# Patient Record
Sex: Male | Born: 1950 | Race: White | Hispanic: No | Smoking: Former smoker
Health system: Southern US, Community
[De-identification: ages and names within clinical notes are randomized; demographics above are authoritative.]

## PROBLEM LIST (undated history)

## (undated) DIAGNOSIS — N1832 Chronic kidney disease, stage 3b: Secondary | ICD-10-CM

## (undated) DIAGNOSIS — M1990 Unspecified osteoarthritis, unspecified site: Secondary | ICD-10-CM

## (undated) DIAGNOSIS — F32A Depression, unspecified: Secondary | ICD-10-CM

## (undated) DIAGNOSIS — K219 Gastro-esophageal reflux disease without esophagitis: Secondary | ICD-10-CM

## (undated) DIAGNOSIS — H269 Unspecified cataract: Secondary | ICD-10-CM

## (undated) DIAGNOSIS — F329 Major depressive disorder, single episode, unspecified: Secondary | ICD-10-CM

## (undated) DIAGNOSIS — E119 Type 2 diabetes mellitus without complications: Secondary | ICD-10-CM

## (undated) DIAGNOSIS — I10 Essential (primary) hypertension: Secondary | ICD-10-CM

## (undated) DIAGNOSIS — H409 Unspecified glaucoma: Secondary | ICD-10-CM

## (undated) DIAGNOSIS — E785 Hyperlipidemia, unspecified: Secondary | ICD-10-CM

## (undated) DIAGNOSIS — I2510 Atherosclerotic heart disease of native coronary artery without angina pectoris: Secondary | ICD-10-CM

## (undated) HISTORY — DX: Unspecified osteoarthritis, unspecified site: M19.90

## (undated) HISTORY — DX: Major depressive disorder, single episode, unspecified: F32.9

## (undated) HISTORY — DX: Hyperlipidemia, unspecified: E78.5

## (undated) HISTORY — DX: Gastro-esophageal reflux disease without esophagitis: K21.9

## (undated) HISTORY — PX: CAROTID STENT: SHX1301

## (undated) HISTORY — DX: Depression, unspecified: F32.A

## (undated) HISTORY — PX: CORONARY ANGIOPLASTY: SHX604

## (undated) HISTORY — DX: Essential (primary) hypertension: I10

## (undated) HISTORY — DX: Type 2 diabetes mellitus without complications: E11.9

---

## 2005-10-23 HISTORY — PX: OTHER SURGICAL HISTORY: SHX169

## 2006-04-10 ENCOUNTER — Emergency Department: Payer: Self-pay | Admitting: Emergency Medicine

## 2006-04-11 ENCOUNTER — Other Ambulatory Visit: Payer: Self-pay

## 2008-04-08 ENCOUNTER — Other Ambulatory Visit: Payer: Self-pay

## 2008-04-08 ENCOUNTER — Inpatient Hospital Stay: Payer: Self-pay | Admitting: Cardiology

## 2008-04-08 DIAGNOSIS — I2510 Atherosclerotic heart disease of native coronary artery without angina pectoris: Secondary | ICD-10-CM

## 2008-04-08 HISTORY — DX: Atherosclerotic heart disease of native coronary artery without angina pectoris: I25.10

## 2008-04-08 HISTORY — PX: CORONARY ANGIOPLASTY WITH STENT PLACEMENT: SHX49

## 2010-03-19 ENCOUNTER — Emergency Department: Payer: Self-pay | Admitting: Emergency Medicine

## 2010-04-30 ENCOUNTER — Inpatient Hospital Stay: Payer: Self-pay | Admitting: Specialist

## 2010-09-21 ENCOUNTER — Emergency Department: Payer: Self-pay | Admitting: Emergency Medicine

## 2014-06-13 ENCOUNTER — Ambulatory Visit: Payer: Self-pay

## 2014-08-18 DIAGNOSIS — Z9889 Other specified postprocedural states: Secondary | ICD-10-CM | POA: Insufficient documentation

## 2014-12-04 LAB — HEMOGLOBIN A1C: Hgb A1c MFr Bld: 5.9 % (ref 4.0–6.0)

## 2014-12-04 LAB — BASIC METABOLIC PANEL
BUN: 13 mg/dL (ref 4–21)
CREATININE: 1 mg/dL (ref 0.6–1.3)
GLUCOSE: 117 mg/dL
POTASSIUM: 4.4 mmol/L (ref 3.4–5.3)
Sodium: 140 mmol/L (ref 137–147)

## 2014-12-04 LAB — LIPID PANEL
Cholesterol: 147 mg/dL (ref 0–200)
HDL: 40 mg/dL (ref 35–70)
LDL Cholesterol: 85 mg/dL
TRIGLYCERIDES: 108 mg/dL (ref 40–160)

## 2015-03-30 ENCOUNTER — Other Ambulatory Visit: Payer: Self-pay | Admitting: Family Medicine

## 2015-03-30 DIAGNOSIS — E119 Type 2 diabetes mellitus without complications: Secondary | ICD-10-CM

## 2015-03-30 MED ORDER — GLUCOSE BLOOD VI STRP: ORAL_STRIP | Status: DC

## 2015-04-02 ENCOUNTER — Other Ambulatory Visit: Payer: Self-pay | Admitting: Family Medicine

## 2015-04-02 DIAGNOSIS — E119 Type 2 diabetes mellitus without complications: Secondary | ICD-10-CM

## 2015-04-02 MED ORDER — METFORMIN HCL 500 MG PO TABS: 500.0000 mg | ORAL_TABLET | Freq: Two times a day (BID) | ORAL | Status: DC

## 2015-04-21 ENCOUNTER — Telehealth: Payer: Self-pay | Admitting: Family Medicine

## 2015-04-21 NOTE — Telephone Encounter (Signed)
Pt's daughter called requesting refill on his hydrocodone/  Her call back [phone] Thanks, TP

## 2015-04-22 ENCOUNTER — Other Ambulatory Visit: Payer: Self-pay | Admitting: Family Medicine

## 2015-04-22 DIAGNOSIS — M47816 Spondylosis without myelopathy or radiculopathy, lumbar region: Secondary | ICD-10-CM

## 2015-04-22 MED ORDER — HYDROCODONE-ACETAMINOPHEN 5-325 MG PO TABS: ORAL_TABLET | ORAL | Status: DC

## 2015-04-22 NOTE — Telephone Encounter (Signed)
rx up front for pick up 

## 2015-04-22 NOTE — Telephone Encounter (Signed)
Please advise 

## 2015-04-22 NOTE — Telephone Encounter (Signed)
Left message for daughter letting her know Rx is available for pick up

## 2015-05-24 ENCOUNTER — Other Ambulatory Visit: Payer: Self-pay | Admitting: Family Medicine

## 2015-05-27 ENCOUNTER — Other Ambulatory Visit: Payer: Self-pay | Admitting: Family Medicine

## 2015-05-27 DIAGNOSIS — E119 Type 2 diabetes mellitus without complications: Secondary | ICD-10-CM

## 2015-05-27 MED ORDER — GLUCOSE BLOOD VI STRP: ORAL_STRIP | Status: DC

## 2015-05-28 DIAGNOSIS — E119 Type 2 diabetes mellitus without complications: Secondary | ICD-10-CM | POA: Insufficient documentation

## 2015-05-28 DIAGNOSIS — I10 Essential (primary) hypertension: Secondary | ICD-10-CM | POA: Insufficient documentation

## 2015-05-28 DIAGNOSIS — J45909 Unspecified asthma, uncomplicated: Secondary | ICD-10-CM | POA: Insufficient documentation

## 2015-05-28 DIAGNOSIS — G4700 Insomnia, unspecified: Secondary | ICD-10-CM | POA: Insufficient documentation

## 2015-05-28 DIAGNOSIS — E1169 Type 2 diabetes mellitus with other specified complication: Secondary | ICD-10-CM | POA: Insufficient documentation

## 2015-05-28 DIAGNOSIS — I152 Hypertension secondary to endocrine disorders: Secondary | ICD-10-CM | POA: Insufficient documentation

## 2015-05-28 DIAGNOSIS — K219 Gastro-esophageal reflux disease without esophagitis: Secondary | ICD-10-CM | POA: Insufficient documentation

## 2015-05-28 DIAGNOSIS — N452 Orchitis: Secondary | ICD-10-CM | POA: Insufficient documentation

## 2015-05-28 DIAGNOSIS — E785 Hyperlipidemia, unspecified: Secondary | ICD-10-CM

## 2015-05-28 DIAGNOSIS — M479 Spondylosis, unspecified: Secondary | ICD-10-CM | POA: Insufficient documentation

## 2015-05-28 DIAGNOSIS — H5440 Blindness, one eye, unspecified eye: Secondary | ICD-10-CM | POA: Insufficient documentation

## 2015-05-28 DIAGNOSIS — F329 Major depressive disorder, single episode, unspecified: Secondary | ICD-10-CM | POA: Insufficient documentation

## 2015-05-28 DIAGNOSIS — K529 Noninfective gastroenteritis and colitis, unspecified: Secondary | ICD-10-CM | POA: Insufficient documentation

## 2015-05-28 DIAGNOSIS — L84 Corns and callosities: Secondary | ICD-10-CM | POA: Insufficient documentation

## 2015-05-28 DIAGNOSIS — E1159 Type 2 diabetes mellitus with other circulatory complications: Secondary | ICD-10-CM | POA: Insufficient documentation

## 2015-05-28 DIAGNOSIS — R519 Headache, unspecified: Secondary | ICD-10-CM | POA: Insufficient documentation

## 2015-05-28 DIAGNOSIS — R609 Edema, unspecified: Secondary | ICD-10-CM | POA: Insufficient documentation

## 2015-05-28 DIAGNOSIS — R51 Headache: Secondary | ICD-10-CM

## 2015-05-28 DIAGNOSIS — I2510 Atherosclerotic heart disease of native coronary artery without angina pectoris: Secondary | ICD-10-CM | POA: Insufficient documentation

## 2015-05-28 DIAGNOSIS — I00 Rheumatic fever without heart involvement: Secondary | ICD-10-CM | POA: Insufficient documentation

## 2015-05-28 DIAGNOSIS — B353 Tinea pedis: Secondary | ICD-10-CM | POA: Insufficient documentation

## 2015-05-28 DIAGNOSIS — F32A Depression, unspecified: Secondary | ICD-10-CM | POA: Insufficient documentation

## 2015-05-28 DIAGNOSIS — F17200 Nicotine dependence, unspecified, uncomplicated: Secondary | ICD-10-CM | POA: Insufficient documentation

## 2015-06-04 ENCOUNTER — Encounter: Payer: Self-pay | Admitting: Family Medicine

## 2015-06-04 ENCOUNTER — Ambulatory Visit (INDEPENDENT_AMBULATORY_CARE_PROVIDER_SITE_OTHER): Payer: Commercial Managed Care - HMO | Admitting: Family Medicine

## 2015-06-04 VITALS — BP 108/76 | HR 64 | Temp 98.1°F | Resp 16 | Ht 72.0 in | Wt 218.2 lb

## 2015-06-04 DIAGNOSIS — M47816 Spondylosis without myelopathy or radiculopathy, lumbar region: Secondary | ICD-10-CM

## 2015-06-04 DIAGNOSIS — I2510 Atherosclerotic heart disease of native coronary artery without angina pectoris: Secondary | ICD-10-CM | POA: Diagnosis not present

## 2015-06-04 DIAGNOSIS — Z23 Encounter for immunization: Secondary | ICD-10-CM | POA: Diagnosis not present

## 2015-06-04 DIAGNOSIS — E119 Type 2 diabetes mellitus without complications: Secondary | ICD-10-CM

## 2015-06-04 DIAGNOSIS — K219 Gastro-esophageal reflux disease without esophagitis: Secondary | ICD-10-CM

## 2015-06-04 DIAGNOSIS — E785 Hyperlipidemia, unspecified: Secondary | ICD-10-CM | POA: Diagnosis not present

## 2015-06-04 DIAGNOSIS — I10 Essential (primary) hypertension: Secondary | ICD-10-CM | POA: Diagnosis not present

## 2015-06-04 DIAGNOSIS — Z955 Presence of coronary angioplasty implant and graft: Secondary | ICD-10-CM

## 2015-06-04 LAB — POCT GLYCOSYLATED HEMOGLOBIN (HGB A1C): Hemoglobin A1C: 6.9

## 2015-06-04 MED ORDER — HYDROCODONE-ACETAMINOPHEN 5-325 MG PO TABS: ORAL_TABLET | ORAL | Status: DC

## 2015-06-04 MED ORDER — CLOPIDOGREL BISULFATE 75 MG PO TABS: 75.0000 mg | ORAL_TABLET | Freq: Every day | ORAL | Status: DC

## 2015-06-04 NOTE — Patient Instructions (Addendum)
Stop pantoprazole. Try Zantac 75 mg.over the counter 1-2 pills twice daily. We will do a physical at your next visit in 3 months.

## 2015-06-04 NOTE — Progress Notes (Signed)
Subjective:     Patient ID: [name], male   DOB: [birth year], 64 y.o.   MRN: [id number]  HPI  Chief Complaint  Patient presents with  . Diabetes    Patient is present in office for follow up from 12/04/14, patient was advised last office visit to continue Onglyza  and Metformin . Last HgbA1C was 5.9%, patient reports good compliance and tolerance of medication, patient states average blood sugar at home is 130-140.  Marland Kitchen Hypertension    Patient is present for follow up on 12/04/14, patient was advised to continue Metoprlol Tartrate  and HCTZ . Blood pressure at last office visit was 130/72  . Hyperlipidemia    Follow up from 12/04/14, at last office visit patient was advised to continue Crestor  and Fenofibrate   . Gastrophageal Reflux    Follow up from 12/04/14, advised to continue Pantoprazole, patient states that it has been helping.   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 Reports f/u with cardiology in the Spring with f/u in October pending. Labs updated in February.   Review of Systems  Gastrointestinal:       Uses Protonix daily, discussed trial on Zantac and/or prn use of the PPI.  Musculoskeletal: Positive for back pain (uses hydrocodone once or twice daily ).       Objective:   Physical Exam  Constitutional: He appears well-developed and well-nourished. No distress.  Lungs: clear Heart: RRR without murmur Lower extremities: no edema; pedal pulses intact, sensation to monofilament intact, no wounds noted.     Assessment:    1. Type 2 diabetes mellitus without complication - POCT glycosylated hemoglobin (Hb A1C)  2. Arteriosclerosis of coronary artery: per cardiology - clopidogrel (PLAVIX) 75 MG tablet; Take 1 tablet (75 mg total) by mouth daily.  Dispense: 30 tablet; Refill: 11  3. Essential (primary) hypertension  4. Gastroesophageal reflux disease, esophagitis presence not specified  5. Spondylosis of  lumbar region without myelopathy or radiculopathy - HYDROcodone-acetaminophen (NORCO/VICODIN) 5-325 MG per tablet; Every 6 hours as needed for low back pain  Dispense: 60 tablet; Refill: 0  6. HLD (hyperlipidemia)  7. Need for pneumococcal vaccination - Pneumococcal conjugate vaccine 13-valent    Plan:    Discussed updating physical in 3 months.Recommended coming off Protonix and trying Zantac otc twice daily. Continue present medication.

## 2015-06-25 ENCOUNTER — Other Ambulatory Visit: Payer: Self-pay | Admitting: Family Medicine

## 2015-06-25 DIAGNOSIS — E785 Hyperlipidemia, unspecified: Secondary | ICD-10-CM

## 2015-06-25 DIAGNOSIS — I10 Essential (primary) hypertension: Secondary | ICD-10-CM

## 2015-06-25 DIAGNOSIS — I2510 Atherosclerotic heart disease of native coronary artery without angina pectoris: Secondary | ICD-10-CM

## 2015-06-25 DIAGNOSIS — E119 Type 2 diabetes mellitus without complications: Secondary | ICD-10-CM

## 2015-06-25 MED ORDER — HYDROCHLOROTHIAZIDE 25 MG PO TABS: 25.0000 mg | ORAL_TABLET | Freq: Every day | ORAL | Status: DC

## 2015-06-25 MED ORDER — SAXAGLIPTIN HCL 5 MG PO TABS: 5.0000 mg | ORAL_TABLET | Freq: Every day | ORAL | Status: DC

## 2015-06-25 MED ORDER — ROSUVASTATIN CALCIUM 20 MG PO TABS: 20.0000 mg | ORAL_TABLET | Freq: Every day | ORAL | Status: DC

## 2015-06-25 MED ORDER — METOPROLOL TARTRATE 50 MG PO TABS: 50.0000 mg | ORAL_TABLET | Freq: Two times a day (BID) | ORAL | Status: DC

## 2015-07-12 ENCOUNTER — Other Ambulatory Visit: Payer: Self-pay | Admitting: Family Medicine

## 2015-07-12 DIAGNOSIS — M47816 Spondylosis without myelopathy or radiculopathy, lumbar region: Secondary | ICD-10-CM

## 2015-07-12 MED ORDER — HYDROCODONE-ACETAMINOPHEN 5-325 MG PO TABS: ORAL_TABLET | ORAL | Status: DC

## 2015-07-12 NOTE — Telephone Encounter (Signed)
Hydrocodone ready for pickup

## 2015-07-12 NOTE — Telephone Encounter (Signed)
Refill request

## 2015-07-12 NOTE — Telephone Encounter (Signed)
Daughter has been advised that Rx is available for pick up. KW

## 2015-07-12 NOTE — Telephone Encounter (Signed)
Pt's daughter called in to request a refill for pt's HYDROcodone-acetaminophen (NORCO/VICODIN) 5-325 MG per tablet. [name] stated that she forgot to call last week and that pt has ran out of the medication. Thanks TNP

## 2015-07-28 ENCOUNTER — Other Ambulatory Visit: Payer: Self-pay | Admitting: Family Medicine

## 2015-07-30 ENCOUNTER — Other Ambulatory Visit: Payer: Self-pay | Admitting: Family Medicine

## 2015-08-13 ENCOUNTER — Other Ambulatory Visit: Payer: Self-pay | Admitting: Family Medicine

## 2015-08-13 ENCOUNTER — Telehealth: Payer: Self-pay | Admitting: Family Medicine

## 2015-08-13 DIAGNOSIS — M47816 Spondylosis without myelopathy or radiculopathy, lumbar region: Secondary | ICD-10-CM

## 2015-08-13 MED ORDER — HYDROCODONE-ACETAMINOPHEN 5-325 MG PO TABS: ORAL_TABLET | ORAL | Status: DC

## 2015-08-13 NOTE — Telephone Encounter (Signed)
LMTCB-KW 

## 2015-08-13 NOTE — Telephone Encounter (Signed)
Refill request

## 2015-08-13 NOTE — Telephone Encounter (Signed)
Daughter has been advised. KW 

## 2015-08-13 NOTE — Telephone Encounter (Signed)
Pt's daughter contacted office for refill request on the following medications: HYDROcodone-acetaminophen (NORCO/VICODIN) 5-325 MG per tablet. Thanks TNP

## 2015-08-13 NOTE — Telephone Encounter (Signed)
Medication available for pick up. 

## 2015-08-26 ENCOUNTER — Other Ambulatory Visit: Payer: Self-pay | Admitting: Family Medicine

## 2015-08-26 DIAGNOSIS — K219 Gastro-esophageal reflux disease without esophagitis: Secondary | ICD-10-CM

## 2015-08-26 MED ORDER — PANTOPRAZOLE SODIUM 40 MG PO TBEC: DELAYED_RELEASE_TABLET | ORAL | Status: DC

## 2015-09-06 ENCOUNTER — Encounter: Payer: Self-pay | Admitting: Family Medicine

## 2015-09-06 ENCOUNTER — Ambulatory Visit (INDEPENDENT_AMBULATORY_CARE_PROVIDER_SITE_OTHER): Payer: Commercial Managed Care - HMO | Admitting: Family Medicine

## 2015-09-06 VITALS — BP 140/66 | HR 60 | Temp 98.5°F | Resp 16 | Ht 72.0 in | Wt 217.4 lb

## 2015-09-06 DIAGNOSIS — I2510 Atherosclerotic heart disease of native coronary artery without angina pectoris: Secondary | ICD-10-CM | POA: Diagnosis not present

## 2015-09-06 DIAGNOSIS — M47816 Spondylosis without myelopathy or radiculopathy, lumbar region: Secondary | ICD-10-CM | POA: Diagnosis not present

## 2015-09-06 DIAGNOSIS — Z23 Encounter for immunization: Secondary | ICD-10-CM | POA: Diagnosis not present

## 2015-09-06 DIAGNOSIS — E785 Hyperlipidemia, unspecified: Secondary | ICD-10-CM | POA: Diagnosis not present

## 2015-09-06 DIAGNOSIS — Z0000 Encounter for general adult medical examination without abnormal findings: Secondary | ICD-10-CM

## 2015-09-06 DIAGNOSIS — E119 Type 2 diabetes mellitus without complications: Secondary | ICD-10-CM | POA: Diagnosis not present

## 2015-09-06 DIAGNOSIS — I10 Essential (primary) hypertension: Secondary | ICD-10-CM

## 2015-09-06 LAB — POCT GLYCOSYLATED HEMOGLOBIN (HGB A1C): Hemoglobin A1C: 6.8

## 2015-09-06 MED ORDER — HYDROCODONE-ACETAMINOPHEN 5-325 MG PO TABS: ORAL_TABLET | ORAL | Status: DC

## 2015-09-06 NOTE — Progress Notes (Signed)
Subjective:     Patient ID: Stephen LemmingJerry H Rolph, male   DOB: [birth year], 64 y.o.   MRN: [id number]  HPI  Chief Complaint  Patient presents with  . Annual Exam    Patient comes into office today for his annual physical, he states his only concern is discussing medication change due to cost with insurance.   Will be changing insurances next year and Onglyza and Crestor will not be covered. Daughter, [name], will check on coverage for Januvia or atorvastatin.   Review of Systems General: Feeling well HEENT: edentulous; pending diabetic eye exam Cardiovascular: no chest pain, shortness of breath, or palpitations. Reports recently saw his cardiologist, Dr. Darrold JunkerParaschos, with no changes made. Defers AAA screen with ultrasound. GI: no heartburn, no change in bowel habits or blood in the stool.Defers colonoscopy. GU: nocturia x 1, no change in bladder habits  Psychiatric: not depressed Musculoskeletal: Will use hydrocodone once or twice daily for back pain. Remains active with LS spine x-ray consistent with DDD.    Objective:   Physical Exam  Constitutional: He appears well-developed and well-nourished. No distress.  Eyes: PERRLA, EOMI Neck: no thyromegaly, tenderness or nodules or cervical adenopathy. Carotids palpable without bruits ENT: TM's intact without inflammation; No tonsillar enlargement or exudate, Lungs: Clear Heart : RRR without murmur or gallop Abd: bowel sounds present, soft, non-tender, no organomegaly. No abdominal bruits or mass. Rectal: Prostate firm and non-tender, brown stool. Extremities: no edema Skin: left axillary skin tag.     Assessment:    1. Medicare annual wellness visit, initial - PSA  2. Type 2 diabetes mellitus without complication, without long-term current use of insulin (HCC): controlled with current medication - POCT glycosylated hemoglobin (Hb A1C)  3. CAD in native artery: Per cardiology  4. HLD (hyperlipidemia): labs obtained earlier this year  5.  Essential (primary) hypertension: stable  7. Spondylosis of lumbar region without myelopathy or radiculopathy - HYDROcodone-acetaminophen (NORCO/VICODIN) 5-325 MG tablet; Every 6 hours as needed for low back pain  Dispense: 60 tablet; Refill: 0  8. Need for influenza vaccination - Flu vaccine HIGH DOSE PF  9. Need for zoster vaccination - Varicella-zoster vaccine subcutaneous   10. GERD: controlled with intermittent use of acid blocker. Plan:    Further f/u pending lab work.

## 2015-09-06 NOTE — Patient Instructions (Signed)
We will call you with the lab work. Remember to update eye exam. Consider colonoscopy.

## 2015-09-07 ENCOUNTER — Telehealth: Payer: Self-pay

## 2015-09-07 LAB — PSA: PROSTATE SPECIFIC AG, SERUM: 0.8 ng/mL (ref 0.0–4.0)

## 2015-09-07 NOTE — Telephone Encounter (Signed)
Patient has been advised of lab report. KW 

## 2015-09-07 NOTE — Telephone Encounter (Signed)
-----   Message from Anola Gurneyobert Chauvin, [place] sent at 09/07/2015  7:50 AM EST ----- psa normal.

## 2015-09-18 ENCOUNTER — Other Ambulatory Visit: Payer: Self-pay | Admitting: Family Medicine

## 2015-09-20 ENCOUNTER — Other Ambulatory Visit: Payer: Self-pay | Admitting: Family Medicine

## 2015-09-20 DIAGNOSIS — E7800 Pure hypercholesterolemia, unspecified: Secondary | ICD-10-CM

## 2015-09-20 MED ORDER — FENOFIBRATE 145 MG PO TABS: 145.0000 mg | ORAL_TABLET | Freq: Every day | ORAL | Status: DC

## 2015-10-11 ENCOUNTER — Other Ambulatory Visit: Payer: Self-pay | Admitting: Family Medicine

## 2015-10-11 ENCOUNTER — Telehealth: Payer: Self-pay | Admitting: Family Medicine

## 2015-10-11 DIAGNOSIS — M47816 Spondylosis without myelopathy or radiculopathy, lumbar region: Secondary | ICD-10-CM

## 2015-10-11 MED ORDER — HYDROCODONE-ACETAMINOPHEN 5-325 MG PO TABS: ORAL_TABLET | ORAL | Status: DC

## 2015-10-11 NOTE — Telephone Encounter (Signed)
Refill request, review. KW

## 2015-10-11 NOTE — Telephone Encounter (Signed)
rx is up front for pickup

## 2015-10-11 NOTE — Telephone Encounter (Signed)
Pt's daughter [name] contacted office for refill request on the following medications: HYDROcodone-acetaminophen (NORCO/VICODIN) 5-325 MG tablet. [name] stated that pt will run out tomorrow. Thanks TNP

## 2015-10-11 NOTE — Telephone Encounter (Signed)
Patient has been advised. KW 

## 2015-10-27 ENCOUNTER — Telehealth: Payer: Self-pay | Admitting: Family Medicine

## 2015-10-27 NOTE — Telephone Encounter (Signed)
Pt daughter, Stephen CosierCrystal called stating the new insurance, [employer] will not cover the Rx rosuvastatin (CRESTOR) 20 MG tablet and saxagliptin HCl (ONGLYZA) 5 MG TABS tablet.  Pt will need these changed to a medication insurance will cover if possible.  [facility].  NW#[phone]/VHCB#[phone]/MW

## 2015-10-28 ENCOUNTER — Other Ambulatory Visit: Payer: Self-pay | Admitting: Family Medicine

## 2015-10-28 DIAGNOSIS — E119 Type 2 diabetes mellitus without complications: Secondary | ICD-10-CM

## 2015-10-28 DIAGNOSIS — E785 Hyperlipidemia, unspecified: Secondary | ICD-10-CM

## 2015-10-28 MED ORDER — SITAGLIPTIN PHOSPHATE 100 MG PO TABS: 100.0000 mg | ORAL_TABLET | Freq: Every day | ORAL | Status: DC

## 2015-10-28 MED ORDER — ATORVASTATIN CALCIUM 80 MG PO TABS: 80.0000 mg | ORAL_TABLET | Freq: Every day | ORAL | Status: DC

## 2015-10-28 NOTE — Telephone Encounter (Signed)
Left message for daughter advised her of prescriptions sent in. KW

## 2015-10-28 NOTE — Telephone Encounter (Signed)
Please review and advise. KW 

## 2015-10-28 NOTE — Telephone Encounter (Signed)
I have changed the medications to others in the same family.

## 2015-11-08 DIAGNOSIS — I10 Essential (primary) hypertension: Secondary | ICD-10-CM | POA: Diagnosis not present

## 2015-11-08 DIAGNOSIS — E1165 Type 2 diabetes mellitus with hyperglycemia: Secondary | ICD-10-CM | POA: Diagnosis not present

## 2015-11-08 DIAGNOSIS — J449 Chronic obstructive pulmonary disease, unspecified: Secondary | ICD-10-CM | POA: Diagnosis not present

## 2015-11-08 DIAGNOSIS — D696 Thrombocytopenia, unspecified: Secondary | ICD-10-CM | POA: Diagnosis not present

## 2015-11-12 ENCOUNTER — Other Ambulatory Visit: Payer: Self-pay | Admitting: Family Medicine

## 2015-11-12 ENCOUNTER — Telehealth: Payer: Self-pay | Admitting: Emergency Medicine

## 2015-11-12 DIAGNOSIS — M47816 Spondylosis without myelopathy or radiculopathy, lumbar region: Secondary | ICD-10-CM

## 2015-11-12 MED ORDER — HYDROCODONE-ACETAMINOPHEN 5-325 MG PO TABS: ORAL_TABLET | ORAL | Status: DC

## 2015-11-12 NOTE — Telephone Encounter (Signed)
rx up front for pick up 

## 2015-11-12 NOTE — Telephone Encounter (Signed)
Pt advised-aa 

## 2015-11-12 NOTE — Telephone Encounter (Signed)
Daughter called requesting refill for pt Hydrocodone/APAP 5/325mg . Call when ready for pick up. Thanks.

## 2015-11-19 DIAGNOSIS — I10 Essential (primary) hypertension: Secondary | ICD-10-CM | POA: Diagnosis not present

## 2015-11-19 DIAGNOSIS — D696 Thrombocytopenia, unspecified: Secondary | ICD-10-CM | POA: Diagnosis not present

## 2015-11-19 DIAGNOSIS — Z7982 Long term (current) use of aspirin: Secondary | ICD-10-CM | POA: Diagnosis not present

## 2015-11-19 DIAGNOSIS — E7800 Pure hypercholesterolemia, unspecified: Secondary | ICD-10-CM | POA: Diagnosis not present

## 2015-11-19 DIAGNOSIS — Z87891 Personal history of nicotine dependence: Secondary | ICD-10-CM | POA: Diagnosis not present

## 2015-11-19 DIAGNOSIS — I2510 Atherosclerotic heart disease of native coronary artery without angina pectoris: Secondary | ICD-10-CM | POA: Diagnosis not present

## 2015-11-19 DIAGNOSIS — M1990 Unspecified osteoarthritis, unspecified site: Secondary | ICD-10-CM | POA: Diagnosis not present

## 2015-11-19 DIAGNOSIS — Z79899 Other long term (current) drug therapy: Secondary | ICD-10-CM | POA: Diagnosis not present

## 2015-11-19 DIAGNOSIS — Z8546 Personal history of malignant neoplasm of prostate: Secondary | ICD-10-CM | POA: Diagnosis not present

## 2015-11-19 DIAGNOSIS — J449 Chronic obstructive pulmonary disease, unspecified: Secondary | ICD-10-CM | POA: Diagnosis not present

## 2015-11-19 DIAGNOSIS — K219 Gastro-esophageal reflux disease without esophagitis: Secondary | ICD-10-CM | POA: Diagnosis not present

## 2015-11-24 ENCOUNTER — Telehealth: Payer: Self-pay | Admitting: Family Medicine

## 2015-11-24 ENCOUNTER — Other Ambulatory Visit: Payer: Self-pay | Admitting: Family Medicine

## 2015-11-24 DIAGNOSIS — E119 Type 2 diabetes mellitus without complications: Secondary | ICD-10-CM

## 2015-11-24 MED ORDER — BLOOD GLUCOSE MONITOR KIT: PACK | Status: AC

## 2015-11-24 NOTE — Telephone Encounter (Signed)
Please review and advise. KW 

## 2015-11-24 NOTE — Telephone Encounter (Signed)
Daughter called needing new RX for his diabetic supplies.  Insurance will only pay for Freestyle or the Onetouce.  They use [facility]  Her call back is [phone]

## 2015-11-24 NOTE — Telephone Encounter (Signed)
Done

## 2015-11-26 ENCOUNTER — Other Ambulatory Visit: Payer: Self-pay | Admitting: Family Medicine

## 2015-11-26 DIAGNOSIS — K219 Gastro-esophageal reflux disease without esophagitis: Secondary | ICD-10-CM

## 2015-11-26 MED ORDER — PANTOPRAZOLE SODIUM 40 MG PO TBEC: DELAYED_RELEASE_TABLET | ORAL | Status: DC

## 2015-12-10 ENCOUNTER — Ambulatory Visit (INDEPENDENT_AMBULATORY_CARE_PROVIDER_SITE_OTHER): Payer: PPO | Admitting: Family Medicine

## 2015-12-10 ENCOUNTER — Encounter: Payer: Self-pay | Admitting: Family Medicine

## 2015-12-10 VITALS — BP 118/76 | HR 59 | Temp 97.7°F | Resp 16 | Wt 222.2 lb

## 2015-12-10 DIAGNOSIS — E119 Type 2 diabetes mellitus without complications: Secondary | ICD-10-CM

## 2015-12-10 DIAGNOSIS — E785 Hyperlipidemia, unspecified: Secondary | ICD-10-CM | POA: Diagnosis not present

## 2015-12-10 DIAGNOSIS — I10 Essential (primary) hypertension: Secondary | ICD-10-CM

## 2015-12-10 DIAGNOSIS — M47816 Spondylosis without myelopathy or radiculopathy, lumbar region: Secondary | ICD-10-CM

## 2015-12-10 LAB — POCT GLYCOSYLATED HEMOGLOBIN (HGB A1C): Hemoglobin A1C: 7.3

## 2015-12-10 MED ORDER — HYDROCODONE-ACETAMINOPHEN 5-325 MG PO TABS: ORAL_TABLET | ORAL | Status: DC

## 2015-12-10 NOTE — Progress Notes (Signed)
Subjective:     Patient ID: [name], male   DOB: 1951/07/15, 65 y.o.   MRN: [id number]  HPI  Chief Complaint  Patient presents with  . Diabetes    Follow up visit from 09/06/15, at last visit patients condition was controlled with medication. Inhouse HgbA1C in house was 6.8%. Patient reports that blood sugar at home has been ranging from 138/178, patient denies any hypoglycemia incidents and is inspecting feet for wounds and sores. Patient reports good compliance, fair tolerance and good symptom control.   States he is pending f/u with his cardiologist in the Spring. He is pending eye exam. He states he has been less active due to the winter weather and has gained 4 # since August.   Review of Systems  Respiratory: Negative for shortness of breath.   Cardiovascular: Negative for chest pain.       Objective:   Physical Exam  Constitutional: He appears well-developed and well-nourished. No distress.  Cardiovascular: Normal rate and regular rhythm.   Pulmonary/Chest: Breath sounds normal.  Musculoskeletal: He exhibits no edema (of lower extremities).       Assessment:    1. Controlled type 2 diabetes mellitus without complication, without long-term current use of insulin (HCC) - POCT glycosylated hemoglobin (Hb A1C)  2. Essential (primary) hypertension - Comprehensive metabolic panel  3. HLD (hyperlipidemia) - Lipid panel  4. Spondylosis of lumbar region without myelopathy or radiculopathy - HYDROcodone-acetaminophen (NORCO/VICODIN) 5-325 MG tablet; Every 6 hours as needed for low back pain  Dispense: 60 tablet; Refill: 0    Plan:    Further f/u pending lab work. Encouraged to resume outdoor activities.

## 2015-12-10 NOTE — Patient Instructions (Signed)
Try to use the acid blocker as needed. If you get more acid burning trying to stop it-taper it slowly or use Zantac instead.

## 2015-12-11 LAB — LIPID PANEL
CHOLESTEROL TOTAL: 169 mg/dL (ref 100–199)
Chol/HDL Ratio: 5.5 ratio units — ABNORMAL HIGH (ref 0.0–5.0)
HDL: 31 mg/dL — AB (ref >39)
LDL CALC: 108 mg/dL — AB (ref 0–99)
TRIGLYCERIDES: 152 mg/dL — AB (ref 0–149)
VLDL Cholesterol Cal: 30 mg/dL (ref 5–40)

## 2015-12-11 LAB — COMPREHENSIVE METABOLIC PANEL
ALK PHOS: 40 IU/L (ref 39–117)
ALT: 28 IU/L (ref 0–44)
AST: 19 IU/L (ref 0–40)
Albumin/Globulin Ratio: 2.1 (ref 1.1–2.5)
Albumin: 4.8 g/dL (ref 3.6–4.8)
BUN/Creatinine Ratio: 13 (ref 10–22)
BUN: 13 mg/dL (ref 8–27)
Bilirubin Total: 0.5 mg/dL (ref 0.0–1.2)
CO2: 24 mmol/L (ref 18–29)
CREATININE: 1.01 mg/dL (ref 0.76–1.27)
Calcium: 10.4 mg/dL — ABNORMAL HIGH (ref 8.6–10.2)
Chloride: 101 mmol/L (ref 96–106)
GFR calc Af Amer: 90 mL/min/{1.73_m2} (ref >59)
GFR calc non Af Amer: 78 mL/min/{1.73_m2} (ref >59)
GLOBULIN, TOTAL: 2.3 g/dL (ref 1.5–4.5)
GLUCOSE: 150 mg/dL — AB (ref 65–99)
Potassium: 4.6 mmol/L (ref 3.5–5.2)
SODIUM: 142 mmol/L (ref 134–144)
Total Protein: 7.1 g/dL (ref 6.0–8.5)

## 2015-12-13 ENCOUNTER — Telehealth: Payer: Self-pay

## 2015-12-13 NOTE — Telephone Encounter (Signed)
-----   Message from Anola Gurney, [place] sent at 12/13/2015  7:53 AM EST ----- Labs are ok. Your cholesterol is not a low as I though it would be-just wish to confirm that you are taking atorvastatin 80 mg.

## 2015-12-13 NOTE — Telephone Encounter (Signed)
Patient was advised he states that he is taking Atorvastatin daily. KW

## 2015-12-20 ENCOUNTER — Encounter: Payer: Self-pay | Admitting: Family Medicine

## 2015-12-20 ENCOUNTER — Ambulatory Visit (INDEPENDENT_AMBULATORY_CARE_PROVIDER_SITE_OTHER): Payer: PPO | Admitting: Family Medicine

## 2015-12-20 VITALS — BP 122/66 | HR 63 | Temp 98.1°F | Resp 16 | Wt 222.2 lb

## 2015-12-20 DIAGNOSIS — E119 Type 2 diabetes mellitus without complications: Secondary | ICD-10-CM | POA: Diagnosis not present

## 2015-12-20 DIAGNOSIS — Q6650 Congenital pes planus, unspecified foot: Secondary | ICD-10-CM | POA: Diagnosis not present

## 2015-12-20 DIAGNOSIS — J069 Acute upper respiratory infection, unspecified: Secondary | ICD-10-CM | POA: Diagnosis not present

## 2015-12-20 DIAGNOSIS — M2040 Other hammer toe(s) (acquired), unspecified foot: Secondary | ICD-10-CM | POA: Diagnosis not present

## 2015-12-20 MED ORDER — HYDROCODONE-HOMATROPINE 5-1.5 MG/5ML PO SYRP: ORAL_SOLUTION | ORAL | Status: DC

## 2015-12-20 MED ORDER — DOXYCYCLINE HYCLATE 100 MG PO TABS: 100.0000 mg | ORAL_TABLET | Freq: Two times a day (BID) | ORAL | Status: DC

## 2015-12-20 NOTE — Progress Notes (Signed)
Subjective:     Patient ID: [name], male   DOB: 09-04-1951, 65 y.o.   MRN: [id number]  HPI  Chief Complaint  Patient presents with  . Cough    Patient comes in office today with concerns of cough and chest congestion for the past 7 days. Patient reports symptoms of body chills and wheezing, he has been taking otc Coricidan HBP with no relief.  Daughter who accompanies him is ill as well. Reports clear sinus drainage and cough occasionally productive of purulent sputum. + flu shot   Review of Systems  Constitutional: Negative for fever.       Objective:   Physical Exam  Constitutional: He appears well-developed and well-nourished. No distress.  Ears: T.M's intact without inflammation Sinuses: non-tender Throat: no tonsillar enlargement or exudate Neck: no cervical adenopathy Lungs: clear     Assessment:    1. Upper respiratory infection - HYDROcodone-homatropine (HYCODAN) 5-1.5 MG/5ML syrup; 5 ml 4-6 hours as needed for cough  Dispense: 240 mL; Refill: 0 - doxycycline (VIBRA-TABS) 100 MG tablet; Take 1 tablet (100 mg total) by mouth 2 (two) times daily.  Dispense: 20 tablet; Refill: 0    Plan:    Discussed use of saline nasal spray and Claritin, If cough not improving in the next 2-3 days to start abx.

## 2015-12-20 NOTE — Patient Instructions (Signed)
Discussed use of saline nasal spray and Claritin. If cough getting worse in the next 2-3 days with increased colored sputum: start the antibiotic.

## 2015-12-22 ENCOUNTER — Other Ambulatory Visit: Payer: Self-pay | Admitting: Family Medicine

## 2015-12-22 DIAGNOSIS — E119 Type 2 diabetes mellitus without complications: Secondary | ICD-10-CM

## 2015-12-22 MED ORDER — METFORMIN HCL 500 MG PO TABS: 500.0000 mg | ORAL_TABLET | Freq: Two times a day (BID) | ORAL | Status: DC

## 2016-01-04 DIAGNOSIS — Z0000 Encounter for general adult medical examination without abnormal findings: Secondary | ICD-10-CM | POA: Diagnosis not present

## 2016-01-13 ENCOUNTER — Other Ambulatory Visit: Payer: Self-pay | Admitting: Family Medicine

## 2016-01-13 DIAGNOSIS — E119 Type 2 diabetes mellitus without complications: Secondary | ICD-10-CM

## 2016-01-13 MED ORDER — GLUCOSE BLOOD VI STRP: ORAL_STRIP | Status: DC

## 2016-01-14 ENCOUNTER — Telehealth: Payer: Self-pay | Admitting: Family Medicine

## 2016-01-14 ENCOUNTER — Other Ambulatory Visit: Payer: Self-pay | Admitting: Family Medicine

## 2016-01-14 DIAGNOSIS — M47816 Spondylosis without myelopathy or radiculopathy, lumbar region: Secondary | ICD-10-CM

## 2016-01-14 MED ORDER — HYDROCODONE-ACETAMINOPHEN 5-325 MG PO TABS: ORAL_TABLET | ORAL | Status: DC

## 2016-01-14 NOTE — Telephone Encounter (Signed)
Medication available for pick up. 

## 2016-01-14 NOTE — Telephone Encounter (Signed)
Daughter called back and was notified. KW

## 2016-01-14 NOTE — Telephone Encounter (Signed)
Pt contacted office for refill request on the following medications:  HYDROcodone-acetaminophen (NORCO/VICODIN) 5-325 MG tablet.  ZO#[phone]/[id number]#[phone]/MW

## 2016-01-14 NOTE — Telephone Encounter (Signed)
LMTCB-KW 

## 2016-01-14 NOTE — Telephone Encounter (Signed)
Refill request, KW 

## 2016-02-11 ENCOUNTER — Other Ambulatory Visit: Payer: Self-pay | Admitting: Family Medicine

## 2016-02-11 ENCOUNTER — Telehealth: Payer: Self-pay | Admitting: Emergency Medicine

## 2016-02-11 DIAGNOSIS — M47816 Spondylosis without myelopathy or radiculopathy, lumbar region: Secondary | ICD-10-CM

## 2016-02-11 DIAGNOSIS — Q6650 Congenital pes planus, unspecified foot: Secondary | ICD-10-CM | POA: Diagnosis not present

## 2016-02-11 DIAGNOSIS — M2040 Other hammer toe(s) (acquired), unspecified foot: Secondary | ICD-10-CM | POA: Diagnosis not present

## 2016-02-11 DIAGNOSIS — E119 Type 2 diabetes mellitus without complications: Secondary | ICD-10-CM | POA: Diagnosis not present

## 2016-02-11 MED ORDER — HYDROCODONE-ACETAMINOPHEN 5-325 MG PO TABS: ORAL_TABLET | ORAL | Status: DC

## 2016-02-11 NOTE — Telephone Encounter (Signed)
Refill request. KW 

## 2016-02-11 NOTE — Telephone Encounter (Signed)
Medication available for pick up. 

## 2016-02-11 NOTE — Telephone Encounter (Signed)
Pt advised-aa 

## 2016-02-11 NOTE — Telephone Encounter (Signed)
Pt daughter request refill on hydrocodone/APAP 5/325mg 

## 2016-02-16 DIAGNOSIS — E1165 Type 2 diabetes mellitus with hyperglycemia: Secondary | ICD-10-CM | POA: Diagnosis not present

## 2016-02-16 DIAGNOSIS — I10 Essential (primary) hypertension: Secondary | ICD-10-CM | POA: Diagnosis not present

## 2016-02-16 DIAGNOSIS — J449 Chronic obstructive pulmonary disease, unspecified: Secondary | ICD-10-CM | POA: Diagnosis not present

## 2016-02-18 DIAGNOSIS — E7800 Pure hypercholesterolemia, unspecified: Secondary | ICD-10-CM | POA: Diagnosis not present

## 2016-02-18 DIAGNOSIS — Z955 Presence of coronary angioplasty implant and graft: Secondary | ICD-10-CM | POA: Diagnosis not present

## 2016-02-18 DIAGNOSIS — I10 Essential (primary) hypertension: Secondary | ICD-10-CM | POA: Diagnosis not present

## 2016-02-18 DIAGNOSIS — Z9889 Other specified postprocedural states: Secondary | ICD-10-CM | POA: Diagnosis not present

## 2016-02-18 DIAGNOSIS — I2510 Atherosclerotic heart disease of native coronary artery without angina pectoris: Secondary | ICD-10-CM | POA: Diagnosis not present

## 2016-02-24 ENCOUNTER — Other Ambulatory Visit: Payer: Self-pay | Admitting: Family Medicine

## 2016-02-24 DIAGNOSIS — E7800 Pure hypercholesterolemia, unspecified: Secondary | ICD-10-CM

## 2016-02-24 MED ORDER — FENOFIBRATE 145 MG PO TABS: 145.0000 mg | ORAL_TABLET | Freq: Every day | ORAL | Status: DC

## 2016-03-08 ENCOUNTER — Ambulatory Visit (INDEPENDENT_AMBULATORY_CARE_PROVIDER_SITE_OTHER): Payer: PPO | Admitting: Family Medicine

## 2016-03-08 ENCOUNTER — Encounter: Payer: Self-pay | Admitting: Family Medicine

## 2016-03-08 VITALS — BP 116/70 | HR 58 | Temp 97.7°F | Resp 16 | Wt 217.2 lb

## 2016-03-08 DIAGNOSIS — E119 Type 2 diabetes mellitus without complications: Secondary | ICD-10-CM

## 2016-03-08 DIAGNOSIS — M47816 Spondylosis without myelopathy or radiculopathy, lumbar region: Secondary | ICD-10-CM | POA: Diagnosis not present

## 2016-03-08 DIAGNOSIS — I10 Essential (primary) hypertension: Secondary | ICD-10-CM | POA: Diagnosis not present

## 2016-03-08 DIAGNOSIS — S86811A Strain of other muscle(s) and tendon(s) at lower leg level, right leg, initial encounter: Secondary | ICD-10-CM

## 2016-03-08 LAB — POCT GLYCOSYLATED HEMOGLOBIN (HGB A1C)

## 2016-03-08 MED ORDER — HYDROCODONE-ACETAMINOPHEN 5-325 MG PO TABS: ORAL_TABLET | ORAL | Status: DC

## 2016-03-08 MED ORDER — HYDROCODONE-ACETAMINOPHEN 5-325 MG PO TABS
ORAL_TABLET | ORAL | Status: DC
Start: 2016-03-08 — End: 2016-06-08

## 2016-03-08 NOTE — Progress Notes (Signed)
Subjective:     Patient ID: [name], male   DOB: 1951-10-18, 65 y.o.   MRN: [id number]  HPI  Chief Complaint  Patient presents with  . Diabetes    Patient comes in office today for 3 month follow up, last office visit was 12/10/15 HgBA1C was 7.3%. Patient reports that sugars have been ranging from 100s-185 (high). Patient denies any hyperglycemia incidents and reports good compliance, tolerance and symptom control on medication  . Hypertension    Follow up from 12/10/15, last office CMP was ordered. Patient reports good compliance and tolerance on medication.   . Hyperlipidemia    Follow up from 12/10/15, last office visit Lipid Panel was ordered. Patient reports good compliance and tolerance of medication.   States he is active mowing and walking and has lost 5# since last office visit. Reports right calf tenderness over the last two weeks when he comes in from working.   Review of Systems  Respiratory: Negative for shortness of breath.   Cardiovascular: Negative for chest pain and palpitations.       Reports seeing his cardiologist, Dr. Darrold JunkerParaschos, recently without change in medication.       Objective:   Physical Exam  Constitutional: He appears well-developed and well-nourished. No distress.  Cardiovascular: Normal rate and regular rhythm.   Pulmonary/Chest: Breath sounds normal.  Musculoskeletal: He exhibits no edema.  Mild right calf tenderness. Mild right calf pain with knee flexion and foot dorsiflexion. KF/KE/PF/DF: 5/5       Assessment:    1. Type 2 diabetes mellitus without complication, without long-term current use of insulin (HCC): controlled - POCT glycosylated hemoglobin (Hb A1C)  2. Essential (primary) hypertension: stable  3. Spondylosis of lumbar region without myelopathy or radiculopathy: will refill pain medication  For 3 months. - HYDROcodone-acetaminophen (NORCO/VICODIN) 5-325 MG tablet; Every 6 hours as needed for low back pain  Dispense: 60 tablet;  Refill: 0  4. Strain of calf muscle, right, initial encounter     Plan:    Discussed icing of calf and use of pain medication. Avoid nsaid's due to cardiovascular disease and Plavix use.

## 2016-03-08 NOTE — Patient Instructions (Signed)
Try icing your calf for 20 minutes at the end of the day. May use pain medication as necessary.

## 2016-03-13 ENCOUNTER — Encounter: Payer: Self-pay | Admitting: Family Medicine

## 2016-03-13 ENCOUNTER — Ambulatory Visit (INDEPENDENT_AMBULATORY_CARE_PROVIDER_SITE_OTHER): Payer: PPO | Admitting: Family Medicine

## 2016-03-13 VITALS — BP 102/62 | HR 67 | Temp 98.1°F | Resp 18 | Wt 218.8 lb

## 2016-03-13 DIAGNOSIS — J069 Acute upper respiratory infection, unspecified: Secondary | ICD-10-CM

## 2016-03-13 NOTE — Progress Notes (Signed)
Subjective:     Patient ID: Alecia LemmingJerry H Linnen, male   DOB: [birth year], 65 y.o.   MRN: [id number] Chief Complaint  Patient presents with  . Cough    Patient comes in office today with concerns of cough that started in 03/09/16. Patient reports that he has had wheezing and bilateral flank pain due to cough. Patient has been taking otc Coricidan HBP.   Reports mild sore throat, occasional purulent sinus drainage and accompanying cough. States he mowed his yard prior to the onset of symptoms. Also states he spends time with his grandchildren. HPI   Review of Systems     Objective:   Physical Exam  Constitutional: He appears well-developed and well-nourished. No distress.  Ears: T.M's intact without inflammation Sinuses:  Throat: no tonsillar enlargement or exudate Neck: no cervical adenopathy Lungs: clear except for a transient wheeze.     Assessment:    1. Upper respiratory infection    Plan:    Discussed otc medication. To call prior to the weekend if sx not improving.

## 2016-03-13 NOTE — Patient Instructions (Addendum)
Discussed use of Coricidin for high bp, Claritin or similar, salt water nasal spray, or Delsym for cough. Your pain medication may also help your cough at night.

## 2016-03-15 DIAGNOSIS — D696 Thrombocytopenia, unspecified: Secondary | ICD-10-CM | POA: Diagnosis not present

## 2016-03-15 DIAGNOSIS — Z7982 Long term (current) use of aspirin: Secondary | ICD-10-CM | POA: Diagnosis not present

## 2016-03-15 DIAGNOSIS — M1990 Unspecified osteoarthritis, unspecified site: Secondary | ICD-10-CM | POA: Diagnosis not present

## 2016-03-15 DIAGNOSIS — K219 Gastro-esophageal reflux disease without esophagitis: Secondary | ICD-10-CM | POA: Diagnosis not present

## 2016-03-15 DIAGNOSIS — J449 Chronic obstructive pulmonary disease, unspecified: Secondary | ICD-10-CM | POA: Diagnosis not present

## 2016-03-15 DIAGNOSIS — I10 Essential (primary) hypertension: Secondary | ICD-10-CM | POA: Diagnosis not present

## 2016-03-15 DIAGNOSIS — Z8546 Personal history of malignant neoplasm of prostate: Secondary | ICD-10-CM | POA: Diagnosis not present

## 2016-03-15 DIAGNOSIS — I2510 Atherosclerotic heart disease of native coronary artery without angina pectoris: Secondary | ICD-10-CM | POA: Diagnosis not present

## 2016-03-15 DIAGNOSIS — Z87891 Personal history of nicotine dependence: Secondary | ICD-10-CM | POA: Diagnosis not present

## 2016-03-15 DIAGNOSIS — E7800 Pure hypercholesterolemia, unspecified: Secondary | ICD-10-CM | POA: Diagnosis not present

## 2016-03-15 DIAGNOSIS — Z79899 Other long term (current) drug therapy: Secondary | ICD-10-CM | POA: Diagnosis not present

## 2016-03-21 ENCOUNTER — Other Ambulatory Visit: Payer: Self-pay | Admitting: Family Medicine

## 2016-03-21 ENCOUNTER — Telehealth: Payer: Self-pay | Admitting: Family Medicine

## 2016-03-21 DIAGNOSIS — J019 Acute sinusitis, unspecified: Secondary | ICD-10-CM

## 2016-03-21 MED ORDER — DOXYCYCLINE HYCLATE 100 MG PO TABS: 100.0000 mg | ORAL_TABLET | Freq: Two times a day (BID) | ORAL | Status: DC

## 2016-03-21 NOTE — Telephone Encounter (Signed)
Daughter reports that the patient continues to have a productive cough. He has not had fever. He does have PND and headache. Symptoms are not relieved with mucinex. Patient uses [facility]. Thanks!

## 2016-03-21 NOTE — Telephone Encounter (Signed)
Left message to call back for more info (has he had fever, upper teeth pain, taken otc meds, etc.)

## 2016-03-21 NOTE — Telephone Encounter (Signed)
Daughter called saying her dad was in last Thursday and seen for sinus. She said Nadine CountsBob told him if he wasn't any better by this week he would call in an antibiotic.  He is still no better.  [facility]  Daughter's call back is [phone]  Thanks, Barth Kirksteri

## 2016-03-21 NOTE — Telephone Encounter (Signed)
Advised daughter Rx is at pharmacy. KW

## 2016-03-21 NOTE — Telephone Encounter (Signed)
I have sent in doxycycline. 

## 2016-03-24 ENCOUNTER — Other Ambulatory Visit: Payer: Self-pay | Admitting: Family Medicine

## 2016-03-24 DIAGNOSIS — I10 Essential (primary) hypertension: Secondary | ICD-10-CM

## 2016-03-24 MED ORDER — LISINOPRIL 2.5 MG PO TABS: 2.5000 mg | ORAL_TABLET | Freq: Every day | ORAL | Status: DC

## 2016-04-06 DIAGNOSIS — I10 Essential (primary) hypertension: Secondary | ICD-10-CM | POA: Diagnosis not present

## 2016-04-06 DIAGNOSIS — I2510 Atherosclerotic heart disease of native coronary artery without angina pectoris: Secondary | ICD-10-CM | POA: Diagnosis not present

## 2016-04-06 DIAGNOSIS — J449 Chronic obstructive pulmonary disease, unspecified: Secondary | ICD-10-CM | POA: Diagnosis not present

## 2016-04-06 DIAGNOSIS — I5032 Chronic diastolic (congestive) heart failure: Secondary | ICD-10-CM | POA: Diagnosis not present

## 2016-04-19 ENCOUNTER — Other Ambulatory Visit: Payer: Self-pay | Admitting: Family Medicine

## 2016-04-19 DIAGNOSIS — K219 Gastro-esophageal reflux disease without esophagitis: Secondary | ICD-10-CM

## 2016-04-19 MED ORDER — PANTOPRAZOLE SODIUM 40 MG PO TBEC: DELAYED_RELEASE_TABLET | ORAL | Status: DC

## 2016-05-18 DIAGNOSIS — I10 Essential (primary) hypertension: Secondary | ICD-10-CM | POA: Diagnosis not present

## 2016-05-18 DIAGNOSIS — J449 Chronic obstructive pulmonary disease, unspecified: Secondary | ICD-10-CM | POA: Diagnosis not present

## 2016-05-18 DIAGNOSIS — E1165 Type 2 diabetes mellitus with hyperglycemia: Secondary | ICD-10-CM | POA: Diagnosis not present

## 2016-05-18 DIAGNOSIS — E1159 Type 2 diabetes mellitus with other circulatory complications: Secondary | ICD-10-CM | POA: Diagnosis not present

## 2016-05-22 ENCOUNTER — Other Ambulatory Visit: Payer: Self-pay | Admitting: Family Medicine

## 2016-05-22 DIAGNOSIS — I10 Essential (primary) hypertension: Secondary | ICD-10-CM

## 2016-05-22 DIAGNOSIS — E119 Type 2 diabetes mellitus without complications: Secondary | ICD-10-CM

## 2016-05-22 DIAGNOSIS — I2510 Atherosclerotic heart disease of native coronary artery without angina pectoris: Secondary | ICD-10-CM

## 2016-05-22 MED ORDER — METOPROLOL TARTRATE 50 MG PO TABS: 50.0000 mg | ORAL_TABLET | Freq: Two times a day (BID) | ORAL | 11 refills | Status: DC

## 2016-05-22 MED ORDER — HYDROCHLOROTHIAZIDE 25 MG PO TABS: 25.0000 mg | ORAL_TABLET | Freq: Every day | ORAL | 11 refills | Status: DC

## 2016-05-22 MED ORDER — METFORMIN HCL 500 MG PO TABS: 500.0000 mg | ORAL_TABLET | Freq: Two times a day (BID) | ORAL | 1 refills | Status: DC

## 2016-06-08 ENCOUNTER — Encounter: Payer: Self-pay | Admitting: Family Medicine

## 2016-06-08 ENCOUNTER — Ambulatory Visit (INDEPENDENT_AMBULATORY_CARE_PROVIDER_SITE_OTHER): Payer: PPO | Admitting: Family Medicine

## 2016-06-08 VITALS — BP 122/68 | HR 66 | Temp 98.2°F | Wt 218.4 lb

## 2016-06-08 DIAGNOSIS — I10 Essential (primary) hypertension: Secondary | ICD-10-CM

## 2016-06-08 DIAGNOSIS — E119 Type 2 diabetes mellitus without complications: Secondary | ICD-10-CM | POA: Diagnosis not present

## 2016-06-08 DIAGNOSIS — M79661 Pain in right lower leg: Secondary | ICD-10-CM | POA: Diagnosis not present

## 2016-06-08 DIAGNOSIS — M47816 Spondylosis without myelopathy or radiculopathy, lumbar region: Secondary | ICD-10-CM

## 2016-06-08 LAB — POCT GLYCOSYLATED HEMOGLOBIN (HGB A1C): HEMOGLOBIN A1C: 7.6

## 2016-06-08 MED ORDER — METFORMIN HCL 1000 MG PO TABS: 1000.0000 mg | ORAL_TABLET | Freq: Two times a day (BID) | ORAL | 3 refills | Status: DC

## 2016-06-08 MED ORDER — HYDROCODONE-ACETAMINOPHEN 5-325 MG PO TABS: ORAL_TABLET | ORAL | 0 refills | Status: DC

## 2016-06-08 NOTE — Patient Instructions (Signed)
We will schedule the test of your leg in the office and let you know when to come in..Marland Kitchen

## 2016-06-08 NOTE — Progress Notes (Signed)
Subjective:     Patient ID: [name], male   DOB: [birth year], 65 y.o.   MRN: [id number]  HPI  Chief Complaint  Patient presents with  . Diabetes    Patient comes in office today for 3 month follow up last office visit was 03/08/16, HgbA1C 6.8%. Patient reports blood sugar has been ranging from 125-200, he reports good compliance and tolerance on medication. Patient denies any hyperglycemia incidents and is inspecting his feet for wounds and sores.   . Hypertension    Follow up from 03/08/16, at last visit patients condition was stable blood pressure in house was 102/62.  States he has been spending time at the bedside of his dying mother. He consequently has not been exercising as much and not eating as well. One # weight gain since prior office visit. Also continues to have calf pain. He notices it the most when he is walking but tends to ignore it per his report. Can not elicit a clearcut claudication history.   Review of Systems  Respiratory: Negative for shortness of breath.   Cardiovascular: Negative for chest pain and palpitations.       Objective:   Physical Exam  Constitutional: He appears well-developed and well-nourished. No distress.  Musculoskeletal:  Right calf without erythema or swelling. Mild medial calf tenderness.  Lungs: clear Heart: RRR without murmur Lower extremities: no edema; pedal pulses intact in left lower extremity. Right lower extremity DP is 2+ and PT is 1+., Sensation to monofilament intact, no wounds noted.     Assessment:    1. Controlled type 2 diabetes mellitus without complication, without long-term current use of insulin (HCC): will increase metformin  - POCT glycosylated hemoglobin (Hb A1C) - metFORMIN (GLUCOPHAGE) 1000 MG tablet; Take 1 tablet (1,000 mg total) by mouth 2 (two) times daily with a meal.  Dispense: 180 tablet; Refill: 3  2. Calf pain, right - Ankle Brachial Index Assessment (BFP)  3. Essential (primary) hypertension:  stable  4. Spondylosis of lumbar region without myelopathy or radiculopathy: 3 month rx for hydrocodone. Not a good candidate for nsaid's due to CAD and plavix use. - HYDROcodone-acetaminophen (NORCO/VICODIN) 5-325 MG tablet; Every 6 hours as needed for low back pain  Dispense: 60 tablet; Refill:0    Plan:    Further f/u pending ABI results

## 2016-06-16 ENCOUNTER — Encounter: Payer: Self-pay | Admitting: Family Medicine

## 2016-06-16 ENCOUNTER — Encounter (INDEPENDENT_AMBULATORY_CARE_PROVIDER_SITE_OTHER): Payer: PPO

## 2016-06-27 ENCOUNTER — Other Ambulatory Visit: Payer: Self-pay | Admitting: Family Medicine

## 2016-06-27 DIAGNOSIS — I2510 Atherosclerotic heart disease of native coronary artery without angina pectoris: Secondary | ICD-10-CM

## 2016-06-27 MED ORDER — CLOPIDOGREL BISULFATE 75 MG PO TABS: 75.0000 mg | ORAL_TABLET | Freq: Every day | ORAL | 11 refills | Status: DC

## 2016-07-06 DIAGNOSIS — Z23 Encounter for immunization: Secondary | ICD-10-CM | POA: Diagnosis not present

## 2016-07-06 DIAGNOSIS — E785 Hyperlipidemia, unspecified: Secondary | ICD-10-CM | POA: Diagnosis not present

## 2016-07-06 DIAGNOSIS — L570 Actinic keratosis: Secondary | ICD-10-CM | POA: Diagnosis not present

## 2016-08-16 DIAGNOSIS — E1159 Type 2 diabetes mellitus with other circulatory complications: Secondary | ICD-10-CM | POA: Diagnosis not present

## 2016-08-16 DIAGNOSIS — Z8546 Personal history of malignant neoplasm of prostate: Secondary | ICD-10-CM | POA: Diagnosis not present

## 2016-08-16 DIAGNOSIS — H1013 Acute atopic conjunctivitis, bilateral: Secondary | ICD-10-CM | POA: Diagnosis not present

## 2016-08-16 DIAGNOSIS — J449 Chronic obstructive pulmonary disease, unspecified: Secondary | ICD-10-CM | POA: Diagnosis not present

## 2016-08-17 DIAGNOSIS — Z8546 Personal history of malignant neoplasm of prostate: Secondary | ICD-10-CM | POA: Diagnosis not present

## 2016-08-18 DIAGNOSIS — I2510 Atherosclerotic heart disease of native coronary artery without angina pectoris: Secondary | ICD-10-CM | POA: Diagnosis not present

## 2016-08-18 DIAGNOSIS — E7800 Pure hypercholesterolemia, unspecified: Secondary | ICD-10-CM | POA: Diagnosis not present

## 2016-08-18 DIAGNOSIS — I10 Essential (primary) hypertension: Secondary | ICD-10-CM | POA: Diagnosis not present

## 2016-08-18 DIAGNOSIS — Z9889 Other specified postprocedural states: Secondary | ICD-10-CM | POA: Diagnosis not present

## 2016-09-01 DIAGNOSIS — Z9889 Other specified postprocedural states: Secondary | ICD-10-CM | POA: Diagnosis not present

## 2016-09-01 DIAGNOSIS — I2510 Atherosclerotic heart disease of native coronary artery without angina pectoris: Secondary | ICD-10-CM | POA: Diagnosis not present

## 2016-09-06 DIAGNOSIS — Z8546 Personal history of malignant neoplasm of prostate: Secondary | ICD-10-CM | POA: Diagnosis not present

## 2016-09-06 DIAGNOSIS — I509 Heart failure, unspecified: Secondary | ICD-10-CM | POA: Diagnosis not present

## 2016-09-06 DIAGNOSIS — D696 Thrombocytopenia, unspecified: Secondary | ICD-10-CM | POA: Diagnosis not present

## 2016-09-06 DIAGNOSIS — D649 Anemia, unspecified: Secondary | ICD-10-CM | POA: Diagnosis not present

## 2016-09-06 DIAGNOSIS — J449 Chronic obstructive pulmonary disease, unspecified: Secondary | ICD-10-CM | POA: Diagnosis not present

## 2016-09-06 DIAGNOSIS — K219 Gastro-esophageal reflux disease without esophagitis: Secondary | ICD-10-CM | POA: Diagnosis not present

## 2016-09-06 DIAGNOSIS — I2510 Atherosclerotic heart disease of native coronary artery without angina pectoris: Secondary | ICD-10-CM | POA: Diagnosis not present

## 2016-09-06 DIAGNOSIS — M1990 Unspecified osteoarthritis, unspecified site: Secondary | ICD-10-CM | POA: Diagnosis not present

## 2016-09-06 DIAGNOSIS — I110 Hypertensive heart disease with heart failure: Secondary | ICD-10-CM | POA: Diagnosis not present

## 2016-09-06 DIAGNOSIS — Z7982 Long term (current) use of aspirin: Secondary | ICD-10-CM | POA: Diagnosis not present

## 2016-09-06 DIAGNOSIS — K859 Acute pancreatitis without necrosis or infection, unspecified: Secondary | ICD-10-CM | POA: Diagnosis not present

## 2016-09-06 DIAGNOSIS — Z86010 Personal history of colonic polyps: Secondary | ICD-10-CM | POA: Diagnosis not present

## 2016-09-06 DIAGNOSIS — E7800 Pure hypercholesterolemia, unspecified: Secondary | ICD-10-CM | POA: Diagnosis not present

## 2016-09-06 DIAGNOSIS — Z79899 Other long term (current) drug therapy: Secondary | ICD-10-CM | POA: Diagnosis not present

## 2016-09-06 DIAGNOSIS — Z87891 Personal history of nicotine dependence: Secondary | ICD-10-CM | POA: Diagnosis not present

## 2016-09-08 ENCOUNTER — Encounter: Payer: Self-pay | Admitting: Family Medicine

## 2016-09-08 ENCOUNTER — Ambulatory Visit (INDEPENDENT_AMBULATORY_CARE_PROVIDER_SITE_OTHER): Payer: PPO | Admitting: Family Medicine

## 2016-09-08 VITALS — BP 138/88 | HR 60 | Temp 97.9°F | Resp 16 | Wt 209.0 lb

## 2016-09-08 DIAGNOSIS — I10 Essential (primary) hypertension: Secondary | ICD-10-CM

## 2016-09-08 DIAGNOSIS — E119 Type 2 diabetes mellitus without complications: Secondary | ICD-10-CM | POA: Diagnosis not present

## 2016-09-08 DIAGNOSIS — Z23 Encounter for immunization: Secondary | ICD-10-CM | POA: Diagnosis not present

## 2016-09-08 DIAGNOSIS — M47816 Spondylosis without myelopathy or radiculopathy, lumbar region: Secondary | ICD-10-CM

## 2016-09-08 DIAGNOSIS — Z125 Encounter for screening for malignant neoplasm of prostate: Secondary | ICD-10-CM | POA: Diagnosis not present

## 2016-09-08 LAB — POCT GLYCOSYLATED HEMOGLOBIN (HGB A1C): HEMOGLOBIN A1C: 6.4

## 2016-09-08 MED ORDER — HYDROCODONE-ACETAMINOPHEN 5-325 MG PO TABS: ORAL_TABLET | ORAL | 0 refills | Status: DC

## 2016-09-08 NOTE — Progress Notes (Signed)
Subjective:     Patient ID: Stephen LemmingJerry H Beverley, male   DOB: Nov 24, 1950, 65 y.o.   MRN: [id number]  HPI  Chief Complaint  Patient presents with  . Diabetes    3 month FU. Last OV was 06/08/2016. A1C was 7.6%. Increased Metformin to 1000 mg BID at LOV. Pt reports good compliance with tx, and good tolerance of tx. Denies hypoglycemic incidents. FBS range from 102-113.   Has been recently seen by cardiology with normal stress echocardiogram. Wishes to get flu shot today. Uses pain medication as precribed.   Review of Systems  Gastrointestinal:       Continues to defer colonoscopy  Genitourinary:       Nocturia x one       Objective:   Physical Exam  Constitutional: He appears well-developed and well-nourished. No distress.  Cardiovascular: Normal rate and regular rhythm.   Pulmonary/Chest: Breath sounds normal.  Musculoskeletal: He exhibits no edema (of lower extremities).       Assessment:    1. Spondylosis of lumbar region without myelopathy or radiculopathy: - HYDROcodone-acetaminophen (NORCO/VICODIN) 5-325 MG tablet; Every 6 hours as needed for low back pain  Dispense: 60 tablet; Refill: 0 - HYDROcodone-acetaminophen (NORCO/VICODIN) 5-325 MG tablet; Every 6 hours as needed for low back pain  Dispense: 60 tablet; Refill: 0 - HYDROcodone-acetaminophen (NORCO/VICODIN) 5-325 MG tablet; One every 6 hours as needed for pain  Dispense: 60 tablet; Refill: 0  2. Controlled type 2 diabetes mellitus without complication, without long-term current use of insulin (HCC) - POCT glycosylated hemoglobin (Hb A1C)  3. Essential (primary) hypertension: per cardiology  4. Need for influenza vaccination - Flu vaccine HIGH DOSE PF  5. Screening for prostate cancer - PSA    Plan:    Encouraged eye exam. Further f/u pending PSA result.

## 2016-09-08 NOTE — Patient Instructions (Signed)
We will call you with the PSA result. Don't forget to update your eye exam.

## 2016-09-09 LAB — PSA: Prostate Specific Ag, Serum: 0.8 ng/mL (ref 0.0–4.0)

## 2016-09-11 ENCOUNTER — Telehealth: Payer: Self-pay

## 2016-09-11 NOTE — Telephone Encounter (Signed)
-----   Message from Anola Gurneyobert Chauvin, [place] sent at 09/11/2016  7:34 AM EST ----- Psa is excellent

## 2016-09-11 NOTE — Telephone Encounter (Signed)
Patient has been advised. KW 

## 2016-09-18 DIAGNOSIS — I2510 Atherosclerotic heart disease of native coronary artery without angina pectoris: Secondary | ICD-10-CM | POA: Diagnosis not present

## 2016-09-18 DIAGNOSIS — Z955 Presence of coronary angioplasty implant and graft: Secondary | ICD-10-CM | POA: Diagnosis not present

## 2016-09-18 DIAGNOSIS — E119 Type 2 diabetes mellitus without complications: Secondary | ICD-10-CM | POA: Diagnosis not present

## 2016-09-18 DIAGNOSIS — I10 Essential (primary) hypertension: Secondary | ICD-10-CM | POA: Diagnosis not present

## 2016-09-22 ENCOUNTER — Other Ambulatory Visit: Payer: Self-pay | Admitting: Family Medicine

## 2016-09-22 DIAGNOSIS — E7800 Pure hypercholesterolemia, unspecified: Secondary | ICD-10-CM

## 2016-09-22 MED ORDER — FENOFIBRATE 145 MG PO TABS: 145.0000 mg | ORAL_TABLET | Freq: Every day | ORAL | 3 refills | Status: DC

## 2016-10-03 DIAGNOSIS — I10 Essential (primary) hypertension: Secondary | ICD-10-CM | POA: Diagnosis not present

## 2016-10-03 DIAGNOSIS — I2510 Atherosclerotic heart disease of native coronary artery without angina pectoris: Secondary | ICD-10-CM | POA: Diagnosis not present

## 2016-10-03 DIAGNOSIS — I5032 Chronic diastolic (congestive) heart failure: Secondary | ICD-10-CM | POA: Diagnosis not present

## 2016-10-03 DIAGNOSIS — I272 Pulmonary hypertension, unspecified: Secondary | ICD-10-CM | POA: Diagnosis not present

## 2016-10-24 ENCOUNTER — Other Ambulatory Visit: Payer: Self-pay | Admitting: Family Medicine

## 2016-10-24 DIAGNOSIS — E1169 Type 2 diabetes mellitus with other specified complication: Secondary | ICD-10-CM

## 2016-10-24 DIAGNOSIS — E119 Type 2 diabetes mellitus without complications: Secondary | ICD-10-CM

## 2016-10-24 DIAGNOSIS — E785 Hyperlipidemia, unspecified: Principal | ICD-10-CM

## 2016-10-24 MED ORDER — SITAGLIPTIN PHOSPHATE 100 MG PO TABS: 100.0000 mg | ORAL_TABLET | Freq: Every day | ORAL | 3 refills | Status: DC

## 2016-10-24 MED ORDER — ATORVASTATIN CALCIUM 80 MG PO TABS: 80.0000 mg | ORAL_TABLET | Freq: Every day | ORAL | 3 refills | Status: DC

## 2016-11-22 ENCOUNTER — Other Ambulatory Visit: Payer: Self-pay | Admitting: Family Medicine

## 2016-11-22 DIAGNOSIS — K219 Gastro-esophageal reflux disease without esophagitis: Secondary | ICD-10-CM

## 2016-11-22 MED ORDER — PANTOPRAZOLE SODIUM 40 MG PO TBEC: DELAYED_RELEASE_TABLET | ORAL | 5 refills | Status: DC

## 2016-11-29 DIAGNOSIS — H31013 Macula scars of posterior pole (postinflammatory) (post-traumatic), bilateral: Secondary | ICD-10-CM | POA: Diagnosis not present

## 2016-12-12 ENCOUNTER — Ambulatory Visit: Payer: PPO | Admitting: Family Medicine

## 2016-12-12 ENCOUNTER — Encounter: Payer: Self-pay | Admitting: Family Medicine

## 2016-12-12 ENCOUNTER — Ambulatory Visit (INDEPENDENT_AMBULATORY_CARE_PROVIDER_SITE_OTHER): Payer: PPO | Admitting: Family Medicine

## 2016-12-12 VITALS — BP 140/76 | HR 62 | Temp 98.5°F | Resp 16 | Wt 219.2 lb

## 2016-12-12 DIAGNOSIS — I10 Essential (primary) hypertension: Secondary | ICD-10-CM | POA: Diagnosis not present

## 2016-12-12 DIAGNOSIS — I2510 Atherosclerotic heart disease of native coronary artery without angina pectoris: Secondary | ICD-10-CM | POA: Diagnosis not present

## 2016-12-12 DIAGNOSIS — E782 Mixed hyperlipidemia: Secondary | ICD-10-CM

## 2016-12-12 DIAGNOSIS — M47816 Spondylosis without myelopathy or radiculopathy, lumbar region: Secondary | ICD-10-CM

## 2016-12-12 DIAGNOSIS — E119 Type 2 diabetes mellitus without complications: Secondary | ICD-10-CM | POA: Diagnosis not present

## 2016-12-12 DIAGNOSIS — K219 Gastro-esophageal reflux disease without esophagitis: Secondary | ICD-10-CM | POA: Diagnosis not present

## 2016-12-12 LAB — POCT GLYCOSYLATED HEMOGLOBIN (HGB A1C)

## 2016-12-12 MED ORDER — HYDROCODONE-ACETAMINOPHEN 5-325 MG PO TABS: ORAL_TABLET | ORAL | 0 refills | Status: DC

## 2016-12-12 MED ORDER — METOPROLOL TARTRATE 50 MG PO TABS: 50.0000 mg | ORAL_TABLET | Freq: Two times a day (BID) | ORAL | 3 refills | Status: DC

## 2016-12-12 MED ORDER — PANTOPRAZOLE SODIUM 40 MG PO TBEC: DELAYED_RELEASE_TABLET | ORAL | 3 refills | Status: DC

## 2016-12-12 MED ORDER — CLOPIDOGREL BISULFATE 75 MG PO TABS: 75.0000 mg | ORAL_TABLET | Freq: Every day | ORAL | 3 refills | Status: DC

## 2016-12-12 MED ORDER — HYDROCHLOROTHIAZIDE 25 MG PO TABS: 25.0000 mg | ORAL_TABLET | Freq: Every day | ORAL | 3 refills | Status: DC

## 2016-12-12 NOTE — Patient Instructions (Signed)
Continue regular exercise. Use the acid medication only as needed.

## 2016-12-12 NOTE — Progress Notes (Signed)
Subjective:     Patient ID: [name], male   DOB: [birth year], 66 y.o.   MRN: [id number]  HPI  Chief Complaint  Patient presents with  . Diabetes    Patient returns to office today for 48month follow up, last office visit was 06/08/16 HgbA1C in house was 6.4%. Patient was advised last visit to continue Metformin 1000mg  BID, patient reports good compliance and symptom control on medication. Patient blood sugar readings at home have been ranging from 113-150. Paitent denies any hyperglycemia episodes and has been inspecting his feet for wounds and sores.   . Hypertension    Patient returns to office for 48month follow up, last office visit 06/08/16 blood presure reading in house was 122/68.   . Back Pain    Patient returns for follow up, requesting refill on Hydrocodone-Acetaminophen.   Has had recent o.v.with his cardiologist with no changes made. Continues to remain active and uses the hydrocodone for back pain. States he uses the acid blockers every other day. Accompanied by his daughter, [name] today. Wishes all medication with 3 month refills.   Review of Systems     Objective:   Physical Exam  Constitutional: He appears well-developed and well-nourished. No distress.  Cardiovascular: Normal rate and regular rhythm.   Pulmonary/Chest: Breath sounds normal.  Musculoskeletal: He exhibits no edema (of lower extremities).       Assessment:    1. Controlled type 2 diabetes mellitus without complication, without long-term current use of insulin (HCC) - POCT glycosylated hemoglobin (Hb A1C) - Comprehensive metabolic panel  2. Mixed hyperlipidemia - Lipid panel  3. Essential (primary) hypertension: Per cardiology  4. Spondylosis of lumbar region without myelopathy or radiculopathy: 3 month prescriptions provided - HYDROcodone-acetaminophen (NORCO/VICODIN) 5-325 MG tablet; Every 6 hours as needed for low back pain  Dispense: 60 tablet; Refill: 0  5. Arteriosclerosis of coronary  artery: per cardiology - clopidogrel (PLAVIX) 75 MG tablet; Take 1 tablet (75 mg total) by mouth daily.  Dispense: 90 tablet; Refill: 3 - metoprolol (LOPRESSOR) 50 MG tablet; Take 1 tablet (50 mg total) by mouth 2 (two) times daily.  Dispense: 180 tablet; Refill: 3  6. Essential hypertension - hydrochlorothiazide (HYDRODIURIL) 25 MG tablet; Take 1 tablet (25 mg total) by mouth daily.  Dispense: 90 tablet; Refill: 3  7. Gastroesophageal reflux disease without esophagitis - pantoprazole (PROTONIX) 40 MG tablet; TAKE ONE (1) TABLET BY MOUTH EVERY DAY AS NEEDED FOR HEARTBURN OR ACID REFLUX  Dispense: 90 tablet; Refill: 3    Plan:    Continued to encourage regular exercise.

## 2016-12-13 ENCOUNTER — Telehealth: Payer: Self-pay

## 2016-12-13 LAB — COMPREHENSIVE METABOLIC PANEL
A/G RATIO: 1.8 (ref 1.2–2.2)
ALK PHOS: 29 IU/L — AB (ref 39–117)
ALT: 28 IU/L (ref 0–44)
AST: 20 IU/L (ref 0–40)
Albumin: 4.8 g/dL (ref 3.6–4.8)
BUN/Creatinine Ratio: 13 (ref 10–24)
BUN: 13 mg/dL (ref 8–27)
Bilirubin Total: 0.4 mg/dL (ref 0.0–1.2)
CALCIUM: 9.8 mg/dL (ref 8.6–10.2)
CHLORIDE: 99 mmol/L (ref 96–106)
CO2: 24 mmol/L (ref 18–29)
Creatinine, Ser: 0.98 mg/dL (ref 0.76–1.27)
GFR calc Af Amer: 92 (ref >59)
GFR, EST NON AFRICAN AMERICAN: 80 (ref >59)
GLOBULIN, TOTAL: 2.6 (ref 1.5–4.5)
Glucose: 139 mg/dL — ABNORMAL HIGH (ref 65–99)
POTASSIUM: 4.3 mmol/L (ref 3.5–5.2)
SODIUM: 141 mmol/L (ref 134–144)
Total Protein: 7.4 g/dL (ref 6.0–8.5)

## 2016-12-13 LAB — LIPID PANEL
CHOL/HDL RATIO: 4.8 (ref 0.0–5.0)
CHOLESTEROL TOTAL: 158 mg/dL (ref 100–199)
HDL: 33 mg/dL — AB (ref >39)
LDL Calculated: 103 — ABNORMAL HIGH (ref 0–99)
TRIGLYCERIDES: 112 mg/dL (ref 0–149)
VLDL Cholesterol Cal: 22 (ref 5–40)

## 2016-12-13 NOTE — Telephone Encounter (Signed)
-----   Message from ElsieRobert Chauvin, [place] sent at 12/13/2016  7:30 AM EST ----- LDL cholesterol is still a little bit elevated. May wish to focus on low fat diet choices along with the atorvastatin. Will recheck in 3-6 months. If still elevated will add another medication.

## 2016-12-13 NOTE — Telephone Encounter (Signed)
Patient has been advised. KW 

## 2016-12-21 ENCOUNTER — Other Ambulatory Visit: Payer: Self-pay | Admitting: Family Medicine

## 2016-12-22 ENCOUNTER — Other Ambulatory Visit: Payer: Self-pay | Admitting: Family Medicine

## 2016-12-25 ENCOUNTER — Other Ambulatory Visit: Payer: Self-pay | Admitting: Family Medicine

## 2016-12-25 DIAGNOSIS — E119 Type 2 diabetes mellitus without complications: Secondary | ICD-10-CM

## 2016-12-25 NOTE — Telephone Encounter (Signed)
Pt contacted office for refill request on the following medications:  metFORMIN (GLUCOPHAGE) 1000 MG tablet.  [facility].  JX#[phone]/ZHCB#[phone]/MW

## 2016-12-25 NOTE — Telephone Encounter (Signed)
Please review. Thanks!  

## 2016-12-26 NOTE — Telephone Encounter (Signed)
Please check with pharmacy-should have two refills left

## 2016-12-26 NOTE — Telephone Encounter (Signed)
Spoke with pharmacist and clarified prescription that was sent in on 06/08/16 with qty of 80 with 2 refills. Correction was made with pharmacy and patient was notified. KW

## 2017-01-15 ENCOUNTER — Other Ambulatory Visit: Payer: Self-pay | Admitting: Family Medicine

## 2017-01-15 DIAGNOSIS — E119 Type 2 diabetes mellitus without complications: Secondary | ICD-10-CM

## 2017-01-15 MED ORDER — GLUCOSE BLOOD VI STRP: ORAL_STRIP | 3 refills | Status: DC

## 2017-01-22 ENCOUNTER — Other Ambulatory Visit: Payer: Self-pay | Admitting: Family Medicine

## 2017-01-22 DIAGNOSIS — E119 Type 2 diabetes mellitus without complications: Secondary | ICD-10-CM

## 2017-02-12 DIAGNOSIS — H2511 Age-related nuclear cataract, right eye: Secondary | ICD-10-CM | POA: Diagnosis not present

## 2017-02-26 ENCOUNTER — Encounter: Payer: Self-pay | Admitting: *Deleted

## 2017-03-01 ENCOUNTER — Ambulatory Visit: Payer: PPO | Admitting: Anesthesiology

## 2017-03-01 ENCOUNTER — Encounter: Payer: Self-pay | Admitting: *Deleted

## 2017-03-01 ENCOUNTER — Encounter
Admission: RE | Disposition: A | Discharge status: Home / Self Care | Payer: Self-pay | Source: Ambulatory Visit | Attending: Ophthalmology

## 2017-03-01 ENCOUNTER — Ambulatory Visit
Admission: RE | Admit: 2017-03-01 | Discharge: 2017-03-01 | Disposition: A | Discharge status: Home / Self Care | Payer: PPO | Source: Ambulatory Visit | Attending: Ophthalmology | Admitting: Ophthalmology

## 2017-03-01 DIAGNOSIS — M1990 Unspecified osteoarthritis, unspecified site: Secondary | ICD-10-CM | POA: Diagnosis not present

## 2017-03-01 DIAGNOSIS — H2511 Age-related nuclear cataract, right eye: Secondary | ICD-10-CM | POA: Diagnosis not present

## 2017-03-01 DIAGNOSIS — I2510 Atherosclerotic heart disease of native coronary artery without angina pectoris: Secondary | ICD-10-CM | POA: Diagnosis not present

## 2017-03-01 DIAGNOSIS — I10 Essential (primary) hypertension: Secondary | ICD-10-CM | POA: Diagnosis not present

## 2017-03-01 DIAGNOSIS — Z79899 Other long term (current) drug therapy: Secondary | ICD-10-CM | POA: Diagnosis not present

## 2017-03-01 DIAGNOSIS — K219 Gastro-esophageal reflux disease without esophagitis: Secondary | ICD-10-CM | POA: Insufficient documentation

## 2017-03-01 DIAGNOSIS — F329 Major depressive disorder, single episode, unspecified: Secondary | ICD-10-CM | POA: Insufficient documentation

## 2017-03-01 DIAGNOSIS — E1136 Type 2 diabetes mellitus with diabetic cataract: Secondary | ICD-10-CM | POA: Diagnosis not present

## 2017-03-01 DIAGNOSIS — Z955 Presence of coronary angioplasty implant and graft: Secondary | ICD-10-CM | POA: Insufficient documentation

## 2017-03-01 DIAGNOSIS — F17200 Nicotine dependence, unspecified, uncomplicated: Secondary | ICD-10-CM | POA: Insufficient documentation

## 2017-03-01 DIAGNOSIS — E119 Type 2 diabetes mellitus without complications: Secondary | ICD-10-CM | POA: Diagnosis not present

## 2017-03-01 HISTORY — PX: CATARACT EXTRACTION W/PHACO: SHX586

## 2017-03-01 HISTORY — DX: Atherosclerotic heart disease of native coronary artery without angina pectoris: I25.10

## 2017-03-01 LAB — GLUCOSE, CAPILLARY: GLUCOSE-CAPILLARY: 152 mg/dL — AB (ref 65–99)

## 2017-03-01 SURGERY — PHACOEMULSIFICATION, CATARACT, WITH IOL INSERTION
Anesthesia: Monitor Anesthesia Care | Site: Eye | Laterality: Right | Wound class: Clean

## 2017-03-01 MED ORDER — EPINEPHRINE PF 1 MG/ML IJ SOLN
INTRAMUSCULAR | Status: AC
  Filled 2017-03-01: qty 1

## 2017-03-01 MED ORDER — POVIDONE-IODINE 5 % OP SOLN
OPHTHALMIC | Status: DC | PRN
  Administered 2017-03-01: 1 via OPHTHALMIC

## 2017-03-01 MED ORDER — SODIUM HYALURONATE 10 MG/ML IO SOLN
INTRAOCULAR | Status: DC | PRN
  Administered 2017-03-01: 0.55 mL via INTRAOCULAR

## 2017-03-01 MED ORDER — LIDOCAINE HCL (PF) 4 % IJ SOLN
INTRAOCULAR | Status: DC | PRN
  Administered 2017-03-01: 4 mL via OPHTHALMIC

## 2017-03-01 MED ORDER — ARMC OPHTHALMIC DILATING DROPS
OPHTHALMIC | Status: AC
  Filled 2017-03-01: qty 0.4

## 2017-03-01 MED ORDER — MIDAZOLAM HCL 2 MG/2ML IJ SOLN
INTRAMUSCULAR | Status: DC | PRN
  Administered 2017-03-01: 1 mg via INTRAVENOUS

## 2017-03-01 MED ORDER — SODIUM CHLORIDE 0.9 % IV SOLN
INTRAVENOUS | Status: DC
  Administered 2017-03-01: 07:00:00 via INTRAVENOUS

## 2017-03-01 MED ORDER — ARMC OPHTHALMIC DILATING DROPS
1.0000 "application " | OPHTHALMIC | Status: AC
  Administered 2017-03-01 (×3): 1 via OPHTHALMIC

## 2017-03-01 MED ORDER — POVIDONE-IODINE 5 % OP SOLN
OPHTHALMIC | Status: AC
  Filled 2017-03-01: qty 30

## 2017-03-01 MED ORDER — LIDOCAINE HCL (PF) 2 % IJ SOLN
INTRAMUSCULAR | Status: AC
  Filled 2017-03-01: qty 2

## 2017-03-01 MED ORDER — EPINEPHRINE PF 1 MG/ML IJ SOLN
INTRAOCULAR | Status: DC | PRN
  Administered 2017-03-01: 07:00:00 via OPHTHALMIC

## 2017-03-01 MED ORDER — NEOMYCIN-POLYMYXIN-DEXAMETH 3.5-10000-0.1 OP OINT
TOPICAL_OINTMENT | OPHTHALMIC | Status: AC
  Filled 2017-03-01: qty 3.5

## 2017-03-01 MED ORDER — MOXIFLOXACIN HCL 0.5 % OP SOLN
OPHTHALMIC | Status: DC | PRN
  Administered 2017-03-01: 0.2 mL via OPHTHALMIC

## 2017-03-01 MED ORDER — ALFENTANIL 500 MCG/ML IJ INJ
INJECTION | INTRAVENOUS | Status: DC | PRN
  Administered 2017-03-01 (×2): 250 ug via INTRAVENOUS

## 2017-03-01 MED ORDER — NEOMYCIN-POLYMYXIN-DEXAMETH 3.5-10000-0.1 OP OINT
TOPICAL_OINTMENT | OPHTHALMIC | Status: DC | PRN
  Administered 2017-03-01: 1 via OPHTHALMIC

## 2017-03-01 MED ORDER — SODIUM HYALURONATE 10 MG/ML IO SOLN
INTRAOCULAR | Status: AC
  Filled 2017-03-01: qty 0.85

## 2017-03-01 MED ORDER — EPINEPHRINE PF 1 MG/ML IJ SOLN
INTRAMUSCULAR | Status: AC
  Filled 2017-03-01: qty 2

## 2017-03-01 MED ORDER — BUPIVACAINE HCL (PF) 0.75 % IJ SOLN
INTRAMUSCULAR | Status: AC
  Filled 2017-03-01: qty 10

## 2017-03-01 MED ORDER — MOXIFLOXACIN HCL 0.5 % OP SOLN
OPHTHALMIC | Status: AC
  Filled 2017-03-01: qty 3

## 2017-03-01 MED ORDER — MOXIFLOXACIN HCL 0.5 % OP SOLN
1.0000 [drp] | OPHTHALMIC | Status: DC | PRN
Start: 2017-03-01 — End: 2017-03-01

## 2017-03-01 MED ORDER — MIDAZOLAM HCL 2 MG/2ML IJ SOLN
INTRAMUSCULAR | Status: AC
  Filled 2017-03-01: qty 2

## 2017-03-01 MED ORDER — SODIUM HYALURONATE 23 MG/ML IO SOLN
INTRAOCULAR | Status: DC | PRN
  Administered 2017-03-01: 0.6 mL via INTRAOCULAR

## 2017-03-01 MED ORDER — GENTAMICIN SULFATE 0.3 % OP OINT
TOPICAL_OINTMENT | OPHTHALMIC | Status: AC
  Filled 2017-03-01: qty 3.5

## 2017-03-01 MED ORDER — HYALURONIDASE HUMAN 150 UNIT/ML IJ SOLN
INTRAMUSCULAR | Status: AC
  Filled 2017-03-01: qty 1

## 2017-03-01 MED ORDER — SODIUM HYALURONATE 23 MG/ML IO SOLN
INTRAOCULAR | Status: AC
  Filled 2017-03-01: qty 0.6

## 2017-03-01 MED ORDER — LIDOCAINE HCL (PF) 4 % IJ SOLN
INTRAMUSCULAR | Status: DC | PRN
  Administered 2017-03-01: 9 mL via OPHTHALMIC

## 2017-03-01 SURGICAL SUPPLY — 21 items
BANDAGE EYE OVAL (MISCELLANEOUS) ×4 IMPLANT
CANNULA ANT/CHMB 27GA (MISCELLANEOUS) ×4 IMPLANT
CUP MEDICINE 2OZ PLAST GRAD ST (MISCELLANEOUS) ×2 IMPLANT
DISSECTOR HYDRO NUCLEUS 50X22 (MISCELLANEOUS) ×2 IMPLANT
GLOVE BIO SURGEON STRL SZ8 (GLOVE) ×2 IMPLANT
GLOVE BIOGEL M 6.5 STRL (GLOVE) ×2 IMPLANT
GLOVE SURG LX 7.5 STRW (GLOVE) ×1
GLOVE SURG LX STRL 7.5 STRW (GLOVE) ×1 IMPLANT
GOWN STRL REUS W/ TWL LRG LVL3 (GOWN DISPOSABLE) ×2 IMPLANT
GOWN STRL REUS W/TWL LRG LVL3 (GOWN DISPOSABLE) ×2
LENS IOL TECNIS ITEC 15.5 (Intraocular Lens) ×2 IMPLANT
PACK CATARACT (MISCELLANEOUS) ×2 IMPLANT
PACK CATARACT KING (MISCELLANEOUS) ×2 IMPLANT
PACK EYE AFTER SURG (MISCELLANEOUS) ×2 IMPLANT
SOL BSS BAG (MISCELLANEOUS) ×2
SOLUTION BSS BAG (MISCELLANEOUS) ×1 IMPLANT
SYR 3ML LL SCALE MARK (SYRINGE) ×4 IMPLANT
SYR 5ML LL (SYRINGE) ×2 IMPLANT
SYR TB 1ML 27GX1/2 LL (SYRINGE) ×2 IMPLANT
WATER STERILE IRR 250ML POUR (IV SOLUTION) ×2 IMPLANT
WIPE NON LINTING 3.25X3.25 (MISCELLANEOUS) ×2 IMPLANT

## 2017-03-01 NOTE — Anesthesia Preprocedure Evaluation (Signed)
Anesthesia Evaluation  Patient identified by MRN, date of birth, ID band Patient awake    Reviewed: Allergy & Precautions, NPO status , Patient's Chart, lab work & pertinent test results, reviewed documented beta blocker date and time   Airway Mallampati: II  TM Distance: >3 FB     Dental  (+) Chipped   Pulmonary Current Smoker,           Cardiovascular hypertension, Pt. on medications and Pt. on home beta blockers + CAD and + Cardiac Stents       Neuro/Psych PSYCHIATRIC DISORDERS Depression    GI/Hepatic GERD  Controlled,  Endo/Other  diabetes, Type 2  Renal/GU      Musculoskeletal  (+) Arthritis ,   Abdominal   Peds  Hematology   Anesthesia Other Findings   Reproductive/Obstetrics                             Anesthesia Physical Anesthesia Plan  ASA: III  Anesthesia Plan: MAC   Post-op Pain Management:    Induction:   Airway Management Planned:   Additional Equipment:   Intra-op Plan:   Post-operative Plan:   Informed Consent: I have reviewed the patients History and Physical, chart, labs and discussed the procedure including the risks, benefits and alternatives for the proposed anesthesia with the patient or authorized representative who has indicated his/her understanding and acceptance.     Plan Discussed with: CRNA  Anesthesia Plan Comments:         Anesthesia Quick Evaluation

## 2017-03-01 NOTE — Transfer of Care (Signed)
Immediate Anesthesia Transfer of Care Note  Patient: [name]  Procedure(s) Performed: Procedure(s) with comments: CATARACT EXTRACTION PHACO AND INTRAOCULAR LENS PLACEMENT (IOC) (Right) - [place] 22.6 AP% 6.1 CDE 1.37 Fluid pack lot # [id number]  Patient Location: PACU  Anesthesia Type:MAC  Level of Consciousness: awake, alert  and oriented  Airway & Oxygen Therapy: Patient Spontanous Breathing  Post-op Assessment: Post -op Vital signs reviewed and stable  Post vital signs: stable  Last Vitals:  Vitals:   03/01/17 0609 03/01/17 0803  BP: (!) 150/88 120/80  Pulse: 71 66  Resp: 16 12  Temp: (!) 35.9 C 36.8 C    Last Pain:  Vitals:   03/01/17 0803  TempSrc: Oral         Complications: No apparent anesthesia complications

## 2017-03-01 NOTE — Discharge Instructions (Signed)
Eye Surgery Discharge Instructions  Expect mild scratchy sensation or mild soreness. DO NOT RUB YOUR EYE!  The day of surgery:  Minimal physical activity, but bed rest is not required  No reading, computer work, or close hand work  No bending, lifting, or straining.  May watch TV  For 24 hours:  No driving, legal decisions, or alcoholic beverages  Safety precautions  Eat anything you prefer: It is better to start with liquids, then soup then solid foods.  _____ Eye patch should be worn until postoperative exam tomorrow.  ____ Solar shield eyeglasses should be worn for comfort in the sunlight/patch while sleeping  Resume all regular medications including aspirin or Coumadin if these were discontinued prior to surgery. You may shower, bathe, shave, or wash your hair. Tylenol may be taken for mild discomfort.  Call your doctor if you experience significant pain, nausea, or vomiting, fever > 101 or other signs of infection. [phone] or [phone] Specific instructions:  Follow-up Information    [place] CraneKing, Bradley Mark, MD Follow up on 03/02/2017.   Specialty:  Ophthalmology Why:  9:25 Contact information: [address] [place] [address] [phone]

## 2017-03-01 NOTE — H&P (Signed)
The History and Physical notes are on paper, have been signed, and are to be scanned.   I have examined the patient and there are no changes to the H&P.   Stephen BladeBradley King 03/01/2017 7:13 AM

## 2017-03-01 NOTE — Anesthesia Post-op Follow-up Note (Cosign Needed)
Anesthesia QCDR form completed.        

## 2017-03-01 NOTE — Anesthesia Postprocedure Evaluation (Signed)
Anesthesia Post Note  Patient: [name]  Procedure(s) Performed: Procedure(s) (LRB): CATARACT EXTRACTION PHACO AND INTRAOCULAR LENS PLACEMENT (IOC) (Right)  Patient location during evaluation: PACU Anesthesia Type: MAC Level of consciousness: awake and alert Pain management: pain level controlled Vital Signs Assessment: post-procedure vital signs reviewed and stable Respiratory status: spontaneous breathing, nonlabored ventilation, respiratory function stable and patient connected to nasal cannula oxygen Cardiovascular status: stable and blood pressure returned to baseline Anesthetic complications: no     Last Vitals:  Vitals:   03/01/17 0609 03/01/17 0803  BP: (!) 150/88 120/80  Pulse: 71 66  Resp: 16 12  Temp: (!) 35.9 C 36.8 C    Last Pain:  Vitals:   03/01/17 0803  TempSrc: Oral                 Estill Batten

## 2017-03-01 NOTE — Op Note (Signed)
OPERATIVE NOTE  MOSI HANNOLD [id number] 03/01/2017   PREOPERATIVE DIAGNOSIS:  Nuclear sclerotic cataract right eye.  H25.11   POSTOPERATIVE DIAGNOSIS:    Nuclear sclerotic cataract right eye.     PROCEDURE:  Phacoemusification with posterior chamber intraocular lens placement of the right eye   LENS:   Implant Name Type Inv. Item Serial No. Manufacturer Lot No. LRB No. Used  LENS IOL DIOP 15.5 - [id number] Intraocular Lens LENS IOL DIOP 15.5 [id number] AMO   Right 1       PCB00 +15.5   ULTRASOUND TIME: 0 minutes 22.6 seconds.  CDE 1.37   SURGEON:  Benay Pillow, MD, MPH  ANESTHESIOLOGIST: Anesthesiologist: Gunnar Bulla, MD CRNA: Aline Brochure, CRNA   ANESTHESIA:  MAC, with Retrobulbar block with 50/50% mix of 4% lidocaine and 0.75% bupivicaine and a small amount of vitrase. ESTIMATED BLOOD LOSS: less than 1 mL.   COMPLICATIONS:  None.   DESCRIPTION OF PROCEDURE:  The patient was identified in the holding room and transported to the operating room and placed in the supine position under the operating microscope.  The right eye was identified as the operative eye.  A retrobulbar block was performed and it was prepped and draped in the usual sterile ophthalmic fashion.   A 1.0 millimeter clear-corneal paracentesis was made at the 10:30 position. 0.5 ml of preservative-free 1% lidocaine with epinephrine was injected into the anterior chamber.  The anterior chamber was filled with Healon 5 viscoelastic.  A 2.4 millimeter keratome was used to make a near-clear corneal incision at the 8:00 position.  A curvilinear capsulorrhexis was made with a cystotome and capsulorrhexis forceps.  Balanced salt solution was used to hydrodissect and hydrodelineate the nucleus.   Phacoemulsification was then used in stop and chop fashion to remove the lens nucleus and epinucleus.  The remaining cortex was then removed using the irrigation and aspiration handpiece. Healon was then placed into the  capsular bag to distend it for lens placement.  A lens was then injected into the capsular bag.  The remaining viscoelastic was aspirated.   Wounds were hydrated with balanced salt solution.  The anterior chamber was inflated to a physiologic pressure with balanced salt solution.   Intracameral vigamox 0.1 mL undiluted was injected into the eye and a drop placed onto the ocular surface.  No wound leaks were noted.  The patient was taken to the recovery room in stable condition without complications of anesthesia or surgery.  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  Benay Pillow 03/01/2017, 8:00 AM

## 2017-03-16 ENCOUNTER — Ambulatory Visit (INDEPENDENT_AMBULATORY_CARE_PROVIDER_SITE_OTHER): Payer: PPO | Admitting: Family Medicine

## 2017-03-16 ENCOUNTER — Encounter: Payer: Self-pay | Admitting: Family Medicine

## 2017-03-16 VITALS — BP 150/88 | HR 91 | Temp 97.6°F | Resp 16 | Wt 217.8 lb

## 2017-03-16 DIAGNOSIS — M47816 Spondylosis without myelopathy or radiculopathy, lumbar region: Secondary | ICD-10-CM | POA: Diagnosis not present

## 2017-03-16 DIAGNOSIS — I10 Essential (primary) hypertension: Secondary | ICD-10-CM | POA: Diagnosis not present

## 2017-03-16 DIAGNOSIS — E119 Type 2 diabetes mellitus without complications: Secondary | ICD-10-CM | POA: Diagnosis not present

## 2017-03-16 DIAGNOSIS — F17200 Nicotine dependence, unspecified, uncomplicated: Secondary | ICD-10-CM | POA: Diagnosis not present

## 2017-03-16 DIAGNOSIS — I00 Rheumatic fever without heart involvement: Secondary | ICD-10-CM | POA: Diagnosis not present

## 2017-03-16 DIAGNOSIS — I2510 Atherosclerotic heart disease of native coronary artery without angina pectoris: Secondary | ICD-10-CM | POA: Diagnosis not present

## 2017-03-16 DIAGNOSIS — Z9889 Other specified postprocedural states: Secondary | ICD-10-CM | POA: Diagnosis not present

## 2017-03-16 DIAGNOSIS — E785 Hyperlipidemia, unspecified: Secondary | ICD-10-CM | POA: Diagnosis not present

## 2017-03-16 DIAGNOSIS — Z955 Presence of coronary angioplasty implant and graft: Secondary | ICD-10-CM | POA: Diagnosis not present

## 2017-03-16 LAB — POCT GLYCOSYLATED HEMOGLOBIN (HGB A1C)
ESTIMATED AVERAGE GLUCOSE: 160
Hemoglobin A1C: 7.2

## 2017-03-16 MED ORDER — HYDROCODONE-ACETAMINOPHEN 5-325 MG PO TABS
ORAL_TABLET | ORAL | 0 refills | Status: DC
Start: 2017-03-16 — End: 2017-03-16

## 2017-03-16 MED ORDER — HYDROCODONE-ACETAMINOPHEN 5-325 MG PO TABS: ORAL_TABLET | ORAL | 0 refills | Status: DC

## 2017-03-16 NOTE — Patient Instructions (Addendum)
Try Zantac 150 mg. For your heartburn instead of pantoprazole.

## 2017-03-16 NOTE — Progress Notes (Signed)
Subjective:     Patient ID: [name], male   DOB: 1951-03-23, 66 y.o.   MRN: [id number]  HPI  Chief Complaint  Patient presents with  . Follow-up    Diabetes Mellitus type 2. Patient reports is doing well. Denies chest pain, palpitations, fatigue, excessice thrist, haeadache,lightheadedness, or leg sweeling. He had eye surgery on the right eye this months.  States he has not been able to work outside due to recovery from his eye surgery. He will be getting the other eye done in July. Currently completing a course of topical steroid drops. Has appointment with his cardiologist today.   Review of Systems  Gastrointestinal:       States he is using the pantoprazole 4 x week.       Objective:   Physical Exam  Constitutional: He appears well-developed and well-nourished. No distress.  Cardiovascular: Normal rate and regular rhythm.   Pulmonary/Chest: Breath sounds normal.  Musculoskeletal: He exhibits no edema (of lower extremities).       Assessment:    1. Controlled type 2 diabetes mellitus without complication, without long-term current use of insulin [name]: will improve with increased activity - POCT glycosylated hemoglobin (Hb A1C)  2. Essential (primary) hypertension; Per cardiology  3. Spondylosis of lumbar region without myelopathy or radiculopathy: 3 month rx provided - HYDROcodone-acetaminophen (NORCO/VICODIN) 5-325 MG tablet; Every 6 hours as needed for low back pain  Dispense: 60 tablet; Refill: 0 - HYDROcodone-acetaminophen (NORCO/VICODIN) 5-325 MG tablet; Every 6 hours as needed for low back pain  Dispense: 60 tablet; Refill: 0    Plan:    Discussed trying Zantac 150 for heartburn instead of  Pantoprazole. Will check foot sensation at next o.v.

## 2017-04-02 ENCOUNTER — Other Ambulatory Visit: Payer: Self-pay | Admitting: Family Medicine

## 2017-04-02 DIAGNOSIS — I10 Essential (primary) hypertension: Secondary | ICD-10-CM

## 2017-04-11 DIAGNOSIS — H2512 Age-related nuclear cataract, left eye: Secondary | ICD-10-CM | POA: Diagnosis not present

## 2017-04-24 NOTE — Discharge Instructions (Signed)

## 2017-04-30 ENCOUNTER — Encounter: Payer: Self-pay | Admitting: *Deleted

## 2017-05-01 ENCOUNTER — Ambulatory Visit: Payer: PPO | Admitting: Anesthesiology

## 2017-05-01 ENCOUNTER — Encounter
Admission: RE | Disposition: A | Discharge status: Home / Self Care | Payer: Self-pay | Source: Ambulatory Visit | Attending: Ophthalmology

## 2017-05-01 ENCOUNTER — Ambulatory Visit
Admission: RE | Admit: 2017-05-01 | Discharge: 2017-05-01 | Disposition: A | Discharge status: Home / Self Care | Payer: PPO | Source: Ambulatory Visit | Attending: Ophthalmology | Admitting: Ophthalmology

## 2017-05-01 DIAGNOSIS — I2510 Atherosclerotic heart disease of native coronary artery without angina pectoris: Secondary | ICD-10-CM | POA: Insufficient documentation

## 2017-05-01 DIAGNOSIS — H2512 Age-related nuclear cataract, left eye: Secondary | ICD-10-CM | POA: Diagnosis not present

## 2017-05-01 DIAGNOSIS — M1990 Unspecified osteoarthritis, unspecified site: Secondary | ICD-10-CM | POA: Diagnosis not present

## 2017-05-01 DIAGNOSIS — Z955 Presence of coronary angioplasty implant and graft: Secondary | ICD-10-CM | POA: Diagnosis not present

## 2017-05-01 DIAGNOSIS — F329 Major depressive disorder, single episode, unspecified: Secondary | ICD-10-CM | POA: Diagnosis not present

## 2017-05-01 DIAGNOSIS — K219 Gastro-esophageal reflux disease without esophagitis: Secondary | ICD-10-CM | POA: Diagnosis not present

## 2017-05-01 DIAGNOSIS — E7800 Pure hypercholesterolemia, unspecified: Secondary | ICD-10-CM | POA: Insufficient documentation

## 2017-05-01 DIAGNOSIS — I10 Essential (primary) hypertension: Secondary | ICD-10-CM | POA: Diagnosis not present

## 2017-05-01 DIAGNOSIS — Z885 Allergy status to narcotic agent status: Secondary | ICD-10-CM | POA: Insufficient documentation

## 2017-05-01 DIAGNOSIS — F17200 Nicotine dependence, unspecified, uncomplicated: Secondary | ICD-10-CM | POA: Insufficient documentation

## 2017-05-01 DIAGNOSIS — E1136 Type 2 diabetes mellitus with diabetic cataract: Secondary | ICD-10-CM | POA: Diagnosis not present

## 2017-05-01 HISTORY — PX: CATARACT EXTRACTION W/PHACO: SHX586

## 2017-05-01 LAB — GLUCOSE, CAPILLARY
GLUCOSE-CAPILLARY: 132 mg/dL — AB (ref 65–99)
Glucose-Capillary: 154 mg/dL — ABNORMAL HIGH (ref 65–99)

## 2017-05-01 SURGERY — PHACOEMULSIFICATION, CATARACT, WITH IOL INSERTION
Anesthesia: Monitor Anesthesia Care | Laterality: Left | Wound class: Clean

## 2017-05-01 MED ORDER — LIDOCAINE HCL (PF) 4 % IJ SOLN
INTRAMUSCULAR | Status: DC | PRN
  Administered 2017-05-01: 3 mL via OPHTHALMIC

## 2017-05-01 MED ORDER — LIDOCAINE HCL (PF) 2 % IJ SOLN
INTRAOCULAR | Status: DC | PRN
  Administered 2017-05-01: 1 mL via INTRAOCULAR

## 2017-05-01 MED ORDER — EPINEPHRINE PF 1 MG/ML IJ SOLN
INTRAOCULAR | Status: DC | PRN
  Administered 2017-05-01: 79 mL via OPHTHALMIC

## 2017-05-01 MED ORDER — LACTATED RINGERS IV SOLN: 1000.0000 mL | INTRAVENOUS | Status: DC

## 2017-05-01 MED ORDER — ALFENTANIL 500 MCG/ML IJ INJ
INJECTION | INTRAVENOUS | Status: DC | PRN
Start: 2017-05-01 — End: 2017-05-01
  Administered 2017-05-01: 700 ug via INTRAVENOUS

## 2017-05-01 MED ORDER — NEOMYCIN-POLYMYXIN-DEXAMETH 3.5-10000-0.1 OP OINT
TOPICAL_OINTMENT | OPHTHALMIC | Status: DC | PRN
  Administered 2017-05-01: 1 via OPHTHALMIC

## 2017-05-01 MED ORDER — SODIUM HYALURONATE 10 MG/ML IO SOLN
INTRAOCULAR | Status: DC | PRN
Start: 2017-05-01 — End: 2017-05-01
  Administered 2017-05-01: 0.55 mL via INTRAOCULAR

## 2017-05-01 MED ORDER — MIDAZOLAM HCL 2 MG/2ML IJ SOLN
INTRAMUSCULAR | Status: DC | PRN
  Administered 2017-05-01: 2 mg via INTRAVENOUS

## 2017-05-01 MED ORDER — ARMC OPHTHALMIC DILATING DROPS
1.0000 "application " | OPHTHALMIC | Status: DC | PRN
  Administered 2017-05-01 (×3): 1 via OPHTHALMIC

## 2017-05-01 MED ORDER — SODIUM HYALURONATE 23 MG/ML IO SOLN
INTRAOCULAR | Status: DC | PRN
  Administered 2017-05-01: 0.6 mL via INTRAOCULAR

## 2017-05-01 MED ORDER — MOXIFLOXACIN HCL 0.5 % OP SOLN
OPHTHALMIC | Status: DC | PRN
  Administered 2017-05-01: 0.2 mL via OPHTHALMIC

## 2017-05-01 SURGICAL SUPPLY — 16 items

## 2017-05-01 NOTE — Transfer of Care (Signed)
Immediate Anesthesia Transfer of Care Note  Patient: [name]  Procedure(s) Performed: Procedure(s) with comments: CATARACT EXTRACTION PHACO AND INTRAOCULAR LENS PLACEMENT (IOC)  Left Diabetic (Left) - Block Diabetic  Patient Location: PACU  Anesthesia Type: MAC  Level of Consciousness: awake, alert  and patient cooperative  Airway and Oxygen Therapy: Patient Spontanous Breathing and Patient connected to supplemental oxygen  Post-op Assessment: Post-op Vital signs reviewed, Patient's Cardiovascular Status Stable, Respiratory Function Stable, Patent Airway and No signs of Nausea or vomiting  Post-op Vital Signs: Reviewed and stable  Complications: No apparent anesthesia complications

## 2017-05-01 NOTE — Anesthesia Procedure Notes (Signed)
Procedure Name: MAC Performed by: Gearald Stonebraker Pre-anesthesia Checklist: Patient identified, Emergency Drugs available, Suction available, Timeout performed and Patient being monitored Patient Re-evaluated:Patient Re-evaluated prior to inductionOxygen Delivery Method: Nasal cannula Placement Confirmation: positive ETCO2     

## 2017-05-01 NOTE — Anesthesia Preprocedure Evaluation (Addendum)
Anesthesia Evaluation  Patient identified by MRN, date of birth, ID band Patient awake    Reviewed: Allergy & Precautions, NPO status , Patient's Chart, lab work & pertinent test results, reviewed documented beta blocker date and time   Airway Mallampati: I  TM Distance: >3 FB Neck ROM: Full    Dental  (+) Edentulous Lower, Edentulous Upper   Pulmonary Current Smoker,    Pulmonary exam normal breath sounds clear to auscultation       Cardiovascular hypertension, + CAD and + Cardiac Stents  Normal cardiovascular exam Rhythm:Regular Rate:Normal     Neuro/Psych PSYCHIATRIC DISORDERS Depression negative neurological ROS     GI/Hepatic Neg liver ROS, GERD  ,  Endo/Other  diabetes  Renal/GU negative Renal ROS  negative genitourinary   Musculoskeletal  (+) Arthritis ,   Abdominal Normal abdominal exam  (+)  Abdomen: soft.    Peds  Hematology   Anesthesia Other Findings   Reproductive/Obstetrics                            Anesthesia Physical Anesthesia Plan  ASA: III  Anesthesia Plan: MAC   Post-op Pain Management:    Induction: Intravenous  PONV Risk Score and Plan:   Airway Management Planned: Nasal Cannula  Additional Equipment: None  Intra-op Plan:   Post-operative Plan:   Informed Consent: I have reviewed the patients History and Physical, chart, labs and discussed the procedure including the risks, benefits and alternatives for the proposed anesthesia with the patient or authorized representative who has indicated his/her understanding and acceptance.     Plan Discussed with: CRNA, Anesthesiologist and Surgeon  Anesthesia Plan Comments:         Anesthesia Quick Evaluation

## 2017-05-01 NOTE — Op Note (Signed)
OPERATIVE NOTE  [name] [id number] 05/01/2017   PREOPERATIVE DIAGNOSIS:  Nuclear sclerotic cataract left eye.  H25.12   POSTOPERATIVE DIAGNOSIS:    Nuclear sclerotic cataract left eye.     PROCEDURE:  Phacoemusification with posterior chamber intraocular lens placement of the left eye   LENS:   Implant Name Type Inv. Item Serial No. Manufacturer Lot No. LRB No. Used  LENS IOL DIOP 18.0 - [id number] Intraocular Lens LENS IOL DIOP 18.0 [id number]   Left 1       PCB00 +18.0   ULTRASOUND TIME: 0 minutes 34.4 seconds.  CDE 3.81   SURGEON:  Benay Pillow, MD, MPH   ANESTHESIA:  regional with Retrobulbar block and MAC  ESTIMATED BLOOD LOSS: <1 mL   COMPLICATIONS:  None.   DESCRIPTION OF PROCEDURE:  The patient was identified in the holding room and transported to the operating room and placed in the supine position under the operating microscope.  The left eye was identified as the operative eye.  A retrobulbar block was performed, and the left eye was prepped and draped in the usual sterile ophthalmic fashion.   A 1.0 millimeter clear-corneal paracentesis was made at the 5:00 position. 0.5 ml of preservative-free 1% lidocaine with epinephrine was injected into the anterior chamber.  The anterior chamber was filled with Healon 5 viscoelastic.  A 2.4 millimeter keratome was used to make a near-clear corneal incision at the 2:00 position.  A curvilinear capsulorrhexis was made with a cystotome and capsulorrhexis forceps.  Balanced salt solution was used to hydrodissect and hydrodelineate the nucleus.   Phacoemulsification was then used in stop and chop fashion to remove the lens nucleus and epinucleus.  The remaining cortex was then removed using the irrigation and aspiration handpiece. Healon was then placed into the capsular bag to distend it for lens placement.  A lens was then injected into the capsular bag.  The remaining viscoelastic was aspirated.   Wounds were hydrated  with balanced salt solution.  The anterior chamber was inflated to a physiologic pressure with balanced salt solution.  Intracameral vigamox 0.1 mL undiltued was injected into the eye and a drop placed onto the ocular surface.  No wound leaks were noted.  A patch and shield were placed over the left eye.  The patient was taken to the recovery room in stable condition without complications of anesthesia or surgery  Benay Pillow 05/01/2017, 8:08 AM

## 2017-05-01 NOTE — H&P (Signed)
The History and Physical notes are on paper, have been signed, and are to be scanned.   I have examined the patient and there are no changes to the H&P.   Willey BladeBradley Kaityln Kallstrom 05/01/2017 7:24 AM

## 2017-05-01 NOTE — Anesthesia Postprocedure Evaluation (Signed)
Anesthesia Post Note  Patient: [name]  Procedure(s) Performed: Procedure(s) (LRB): CATARACT EXTRACTION PHACO AND INTRAOCULAR LENS PLACEMENT (IOC)  Left Diabetic (Left)  Patient location during evaluation: PACU Anesthesia Type: MAC Level of consciousness: awake Pain management: pain level controlled Vital Signs Assessment: post-procedure vital signs reviewed and stable Respiratory status: spontaneous breathing Cardiovascular status: blood pressure returned to baseline Postop Assessment: no headache Anesthetic complications: no    Lavonna Monarch

## 2017-05-07 ENCOUNTER — Encounter: Payer: Self-pay | Admitting: Family Medicine

## 2017-05-07 ENCOUNTER — Ambulatory Visit (INDEPENDENT_AMBULATORY_CARE_PROVIDER_SITE_OTHER): Payer: PPO | Admitting: Family Medicine

## 2017-05-07 VITALS — BP 130/70 | HR 67 | Temp 98.2°F | Resp 16 | Wt 217.0 lb

## 2017-05-07 DIAGNOSIS — B029 Zoster without complications: Secondary | ICD-10-CM | POA: Diagnosis not present

## 2017-05-07 MED ORDER — VALACYCLOVIR HCL 1 G PO TABS: 1000.0000 mg | ORAL_TABLET | Freq: Three times a day (TID) | ORAL | 0 refills | Status: DC

## 2017-05-07 MED ORDER — NORTRIPTYLINE HCL 25 MG PO CAPS: 25.0000 mg | ORAL_CAPSULE | Freq: Every day | ORAL | 0 refills | Status: DC

## 2017-05-07 NOTE — Patient Instructions (Addendum)
Let me know how you are doing on the medication later this week.

## 2017-05-07 NOTE — Progress Notes (Signed)
Subjective:     Patient ID: [name], male   DOB: 01/20/1951, 89 y.o.   MRN: [id number]  HPI  Chief Complaint  Patient presents with  . Rash    Patient comes in office today with concerns of rash that appearead on back  05/05/17. Patient denies being out in woody areas or in grass, patient describes rash as burning and sore to the touch. Patient states that burning sensation is running along sides of his abdomen.   Currently recovering from cataract surgery. Reports he could not sleep well due to the pain unaffected by hydrocodone he has for his back pain. Accompanied by his daughter, [name], today.   Review of Systems     Objective:   Physical Exam  Constitutional: He appears well-developed and well-nourished. No distress.  Skin:  Left mid-upper back with several erythematous patches with resolving vesicles.       Assessment:    1. Herpes zoster without complication - valACYclovir (VALTREX) 1000 MG tablet; Take 1 tablet (1,000 mg total) by mouth 3 (three) times daily.  Dispense: 21 tablet; Refill: 0 - nortriptyline (PAMELOR) 25 MG capsule; Take 1 capsule (25 mg total) by mouth at bedtime.  Dispense: 14 capsule; Refill: 0    Plan:    Phone f/u later this week.

## 2017-05-11 ENCOUNTER — Telehealth: Payer: Self-pay | Admitting: Family Medicine

## 2017-05-11 NOTE — Telephone Encounter (Signed)
Pt's daughter called back regarding her dad's shingles on left side. Has spread to his left back side.  Their phone number is [phone]  [facility]  Thanks Barth Kirksteri

## 2017-05-11 NOTE — Telephone Encounter (Signed)
Discussed natural history of shingles, limitations of Valtrex, and use of Zostrix.

## 2017-05-11 NOTE — Telephone Encounter (Signed)
Was seen by you 07/16/12018. Please advise.

## 2017-06-20 ENCOUNTER — Other Ambulatory Visit: Payer: Self-pay | Admitting: Family Medicine

## 2017-06-20 DIAGNOSIS — E119 Type 2 diabetes mellitus without complications: Secondary | ICD-10-CM

## 2017-06-22 ENCOUNTER — Ambulatory Visit (INDEPENDENT_AMBULATORY_CARE_PROVIDER_SITE_OTHER): Payer: PPO | Admitting: Family Medicine

## 2017-06-22 ENCOUNTER — Encounter: Payer: Self-pay | Admitting: Family Medicine

## 2017-06-22 ENCOUNTER — Other Ambulatory Visit: Payer: Self-pay | Admitting: Family Medicine

## 2017-06-22 VITALS — BP 132/90 | HR 74 | Temp 98.4°F | Resp 16 | Wt 213.2 lb

## 2017-06-22 DIAGNOSIS — M47816 Spondylosis without myelopathy or radiculopathy, lumbar region: Secondary | ICD-10-CM

## 2017-06-22 DIAGNOSIS — E782 Mixed hyperlipidemia: Secondary | ICD-10-CM | POA: Diagnosis not present

## 2017-06-22 DIAGNOSIS — E119 Type 2 diabetes mellitus without complications: Secondary | ICD-10-CM

## 2017-06-22 DIAGNOSIS — K219 Gastro-esophageal reflux disease without esophagitis: Secondary | ICD-10-CM

## 2017-06-22 DIAGNOSIS — I10 Essential (primary) hypertension: Secondary | ICD-10-CM

## 2017-06-22 LAB — POCT GLYCOSYLATED HEMOGLOBIN (HGB A1C): HEMOGLOBIN A1C: 6.8

## 2017-06-22 MED ORDER — HYDROCODONE-ACETAMINOPHEN 5-325 MG PO TABS: ORAL_TABLET | ORAL | 0 refills | Status: DC

## 2017-06-22 NOTE — Patient Instructions (Signed)
We will call you with the lab results. Continue to use acid blockers (pantoprazole) as needed. Do f/u with cardiology as scheduled.

## 2017-06-22 NOTE — Progress Notes (Signed)
Subjective:     Patient ID: [name], male   DOB: 11-04-1950, 66 y.o.   MRN: [id number]  HPI  Chief Complaint  Patient presents with  . Diabetes    Patient comes in office today for diabetes follow up, last office visit was 03/16/17 and HgbA1C was 7.2%. Patient reports good compliance and tolerance on medication, he has been inspecting his feet for wounds and sores and has noticed no changes, he denis any hypoglycemia incident since last visit.   Marland Kitchen. Hypertension    Patient returns for 3 month follow up, last office visit was 03/16/17 was 150/88  Continues to be followed by cardiology who have increased his lisinopril to 5 mg.at prior visit. Reports shingles resolved without complication. Admits to some stress from 67 year old granddaughter living with him.   Review of Systems  Respiratory: Negative for shortness of breath.   Cardiovascular: Negative for chest pain and palpitations.  Genitourinary:       No longer uses PPI on a daily basis. Will use TUMS or Zantac. Saves pantoprazole for occasional breakthrough symptoms.       Objective:   Physical Exam  Constitutional: He appears well-developed and well-nourished. No distress.  Lungs: clear Heart: RRR without murmur Lower extremities: no edema; pedal pulses intact, sensation to monofilament intact, no wounds noted.     Assessment:    1. Controlled type 2 diabetes mellitus without complication, without long-term current use of insulin (HCC) - POCT glycosylated hemoglobin (Hb A1C)  2. Essential (primary) hypertension: continue current medication  3. Gastro-esophageal reflux disease without esophagitis  4. Mixed hyperlipidemia - Lipid panel  5. Spondylosis of lumbar region without myelopathy or radiculopathy  - HYDROcodone-acetaminophen (NORCO/VICODIN) 5-325 MG tablet; Every 6 hours as needed for low back pain  Dispense: 60 tablet; Refill: 0 - HYDROcodone-acetaminophen (NORCO/VICODIN) 5-325 MG tablet; Every 6 hours as needed  for low back pain  Dispense: 60 tablet; Refill: 0 - HYDROcodone-acetaminophen (NORCO/VICODIN) 5-325 MG tablet; Every 6 hours as needed for low back pain  Dispense: 60 tablet; Refill: 0    Plan:    Further f/u pending lab work. F/u with cardiology as scheduled.

## 2017-06-23 LAB — LIPID PANEL
CHOL/HDL RATIO: 5.4 ratio — AB (ref 0.0–5.0)
Cholesterol, Total: 152 mg/dL (ref 100–199)
HDL: 28 mg/dL — AB (ref >39)
LDL Calculated: 100 mg/dL — ABNORMAL HIGH (ref 0–99)
TRIGLYCERIDES: 120 mg/dL (ref 0–149)
VLDL CHOLESTEROL CAL: 24 mg/dL (ref 5–40)

## 2017-06-26 ENCOUNTER — Telehealth: Payer: Self-pay

## 2017-06-26 ENCOUNTER — Other Ambulatory Visit: Payer: Self-pay | Admitting: Family Medicine

## 2017-06-26 DIAGNOSIS — E119 Type 2 diabetes mellitus without complications: Secondary | ICD-10-CM

## 2017-06-26 NOTE — Telephone Encounter (Signed)
LMTCB 06/26/2017  Thanks,   -Vernona RiegerLaura

## 2017-06-26 NOTE — Telephone Encounter (Signed)
-----   Message from Anola Gurneyobert Chauvin, [place] sent at 06/26/2017  7:44 AM EDT ----- LDL (bad cholesterol) has improved a bit from previous labs. Continue present medication pending cardiology visit.

## 2017-06-28 NOTE — Telephone Encounter (Signed)
Patient was advised he wanted to know if you received his request for refill on Valtrex? KW

## 2017-06-28 NOTE — Telephone Encounter (Signed)
No-that was only a one week course of medication for the shingles outbreak.

## 2017-06-29 ENCOUNTER — Other Ambulatory Visit: Payer: Self-pay | Admitting: Family Medicine

## 2017-06-29 MED ORDER — LISINOPRIL 5 MG PO TABS: 5.0000 mg | ORAL_TABLET | Freq: Every day | ORAL | 3 refills | Status: DC

## 2017-06-29 NOTE — Telephone Encounter (Signed)
done

## 2017-06-29 NOTE — Telephone Encounter (Signed)
Patient was advised and states that he was was mistaken that he needed a refill on his blood pressure medication sent to [facility]. KW

## 2017-09-04 ENCOUNTER — Other Ambulatory Visit: Payer: Self-pay | Admitting: Family Medicine

## 2017-09-04 DIAGNOSIS — E119 Type 2 diabetes mellitus without complications: Secondary | ICD-10-CM

## 2017-09-21 ENCOUNTER — Other Ambulatory Visit: Payer: Self-pay | Admitting: Family Medicine

## 2017-09-21 ENCOUNTER — Ambulatory Visit: Payer: PPO | Admitting: Family Medicine

## 2017-09-21 ENCOUNTER — Encounter: Payer: Self-pay | Admitting: Family Medicine

## 2017-09-21 ENCOUNTER — Telehealth: Payer: Self-pay | Admitting: Family Medicine

## 2017-09-21 VITALS — BP 122/90 | HR 58 | Temp 98.0°F | Resp 15 | Wt 213.6 lb

## 2017-09-21 DIAGNOSIS — E119 Type 2 diabetes mellitus without complications: Secondary | ICD-10-CM

## 2017-09-21 DIAGNOSIS — M47816 Spondylosis without myelopathy or radiculopathy, lumbar region: Secondary | ICD-10-CM | POA: Diagnosis not present

## 2017-09-21 DIAGNOSIS — Z1211 Encounter for screening for malignant neoplasm of colon: Secondary | ICD-10-CM

## 2017-09-21 DIAGNOSIS — E7800 Pure hypercholesterolemia, unspecified: Secondary | ICD-10-CM

## 2017-09-21 DIAGNOSIS — I10 Essential (primary) hypertension: Secondary | ICD-10-CM | POA: Diagnosis not present

## 2017-09-21 DIAGNOSIS — S46811A Strain of other muscles, fascia and tendons at shoulder and upper arm level, right arm, initial encounter: Secondary | ICD-10-CM

## 2017-09-21 DIAGNOSIS — E785 Hyperlipidemia, unspecified: Secondary | ICD-10-CM | POA: Diagnosis not present

## 2017-09-21 DIAGNOSIS — Z9889 Other specified postprocedural states: Secondary | ICD-10-CM | POA: Diagnosis not present

## 2017-09-21 DIAGNOSIS — Z23 Encounter for immunization: Secondary | ICD-10-CM

## 2017-09-21 DIAGNOSIS — I2510 Atherosclerotic heart disease of native coronary artery without angina pectoris: Secondary | ICD-10-CM | POA: Diagnosis not present

## 2017-09-21 DIAGNOSIS — Z955 Presence of coronary angioplasty implant and graft: Secondary | ICD-10-CM | POA: Diagnosis not present

## 2017-09-21 LAB — POCT GLYCOSYLATED HEMOGLOBIN (HGB A1C): Hemoglobin A1C: 6.8

## 2017-09-21 MED ORDER — HYDROCODONE-ACETAMINOPHEN 5-325 MG PO TABS: ORAL_TABLET | ORAL | 0 refills | Status: DC

## 2017-09-21 NOTE — Progress Notes (Signed)
Subjective:     Patient ID: [name], male   DOB: [birth year], 89 y.o.   MRN: [id number] Chief Complaint  Patient presents with  . Diabetes    Patient returns for follow up from 06/22/17 HgbA1C at visit 6.8%, patient reports that he has had good compliance, tolerance and symptom control on medication. Patient denies any symptoms of increased thirst, urination or changes to feet for wounds or sores. Patient reports that blood sugar readings at home range from 120-124, he has been following a well balanced diet.  . Hypertension    Follow up from 06/22/17, patients blood pressure in house was 132/90. Patient reports good compliance and tolerance on medication   . Neck Pain    Patient reports for the past month he has had pain on the right side of his neck.    HPI States he sleeps on the couch 3 x week and uses several pillows. The rest of the time he likes sleeping in his recliner. Reports neck was sore on the left initially now it is on the right side without radiation. Has applied heat and used hydrocodone with improvement. Accompanied by his daughter, [name], today. Review of Systems  Constitutional:       Will get Pneumovax 23 and flu vaccine today  Respiratory: Negative for shortness of breath.   Cardiovascular: Negative for chest pain and palpitations.  Gastrointestinal:       Does not wish a colonoscopy but agrees to Cologard. Grandmother has had colon cancer.  Genitourinary:       Nocturia x 1       Objective:   Physical Exam  Constitutional: He appears well-developed and well-nourished. No distress.  Cardiovascular: Normal rate and regular rhythm.  Pulmonary/Chest: Breath sounds normal.  Musculoskeletal: He exhibits no edema (of lower extremities).  Cervical FROM. Moderately tender over his right upper trapezius/base of his neck       Assessment:    1. Need for influenza vaccination - Flu vaccine HIGH DOSE PF  2. Controlled type 2 diabetes mellitus without  complication, without long-term current use of insulin (HCC) - POCT glycosylated hemoglobin (Hb A1C)  3. Screen for colon cancer: form completed for Cologard  4. Need for vaccination against Streptococcus pneumoniae - Pneumococcal polysaccharide vaccine 23-valent greater than or equal to 2yo subcutaneous/IM  5. Spondylosis of lumbar region without myelopathy or radiculopathy: rx x 3 months. - HYDROcodone-acetaminophen (NORCO/VICODIN) 5-325 MG tablet; Every 6 hours as needed for low back pain  Dispense: 60 tablet; Refill: 0  6. Trapezius strain, right, initial encounter    Plan:    Continue warm compresses and use only one pillow when sleeping on the couch.

## 2017-09-21 NOTE — Telephone Encounter (Signed)
Order for cologuard faxed to [facility] °

## 2017-09-21 NOTE — Patient Instructions (Addendum)
You will be notified about the Cologard cancer screening test. Continue warm compresses to your shoulder for 20 minutes several x day. When sleeping on the couch use only one pillow.

## 2017-10-18 ENCOUNTER — Other Ambulatory Visit: Payer: Self-pay | Admitting: Family Medicine

## 2017-10-18 DIAGNOSIS — E785 Hyperlipidemia, unspecified: Principal | ICD-10-CM

## 2017-10-18 DIAGNOSIS — E1169 Type 2 diabetes mellitus with other specified complication: Secondary | ICD-10-CM

## 2017-10-29 DIAGNOSIS — Z1211 Encounter for screening for malignant neoplasm of colon: Secondary | ICD-10-CM | POA: Diagnosis not present

## 2017-10-29 DIAGNOSIS — Z1212 Encounter for screening for malignant neoplasm of rectum: Secondary | ICD-10-CM | POA: Diagnosis not present

## 2017-11-26 ENCOUNTER — Ambulatory Visit (INDEPENDENT_AMBULATORY_CARE_PROVIDER_SITE_OTHER): Payer: PPO | Admitting: Family Medicine

## 2017-11-26 ENCOUNTER — Encounter: Payer: Self-pay | Admitting: Family Medicine

## 2017-11-26 VITALS — BP 130/88 | HR 93 | Temp 99.7°F | Resp 16 | Wt 210.0 lb

## 2017-11-26 DIAGNOSIS — B349 Viral infection, unspecified: Secondary | ICD-10-CM

## 2017-11-26 LAB — POC INFLUENZA A&B (BINAX/QUICKVUE)
INFLUENZA A, POC: NEGATIVE
Influenza B, POC: NEGATIVE

## 2017-11-26 MED ORDER — OSELTAMIVIR PHOSPHATE 75 MG PO CAPS: 75.0000 mg | ORAL_CAPSULE | Freq: Two times a day (BID) | ORAL | 0 refills | Status: DC

## 2017-11-26 NOTE — Progress Notes (Signed)
Subjective:     Patient ID: [name], male   DOB: 1951-03-02, 67 y.o.   MRN: [id number] Chief Complaint  Patient presents with  . Fever    Patient comes in office today with complaintys of fever and chills for 48hrs. Patient reports productive cough, dizziness/off balance, right ear pain, weakness, decreased appetite, nausea and fever high of 100.5. Patient has tried taking otc Tylenol for relief.    HPI  Reports flu-like sx.Daughter accompanies and states she got a temp of 103. + flu shot  Review of Systems     Objective:   Physical Exam  Constitutional: He appears well-developed and well-nourished. No distress.  Ears: T.M's obscured by cerumen Throat: no tonsillar enlargement or exudate Neck: no cervical adenopathy Lungs: clear     Assessment:    1. Acute viral syndrome: will cover for flu due to his chronic medical problems - POC Influenza A&B(BINAX/QUICKVUE) - oseltamivir (TAMIFLU) 75 MG capsule; Take 1 capsule (75 mg total) by mouth 2 (two) times daily.  Dispense: 10 capsule; Refill: 0     Plan:    Phone f/u if not improving by the end of the week. Discussed otc medication.

## 2017-11-26 NOTE — Patient Instructions (Addendum)
Discussed use of Mucinex and Delsym. Increase fluids with Gatorade and water. Eat as tolerated. Let me know if new symptoms or not improving over the course of the week.

## 2017-12-07 ENCOUNTER — Encounter: Payer: Self-pay | Admitting: Family Medicine

## 2017-12-20 ENCOUNTER — Ambulatory Visit (INDEPENDENT_AMBULATORY_CARE_PROVIDER_SITE_OTHER): Payer: PPO | Admitting: Family Medicine

## 2017-12-20 ENCOUNTER — Encounter: Payer: Self-pay | Admitting: Family Medicine

## 2017-12-20 VITALS — BP 144/82 | HR 68 | Temp 98.7°F | Resp 16 | Wt 212.0 lb

## 2017-12-20 DIAGNOSIS — R42 Dizziness and giddiness: Secondary | ICD-10-CM | POA: Diagnosis not present

## 2017-12-20 DIAGNOSIS — E119 Type 2 diabetes mellitus without complications: Secondary | ICD-10-CM

## 2017-12-20 DIAGNOSIS — K219 Gastro-esophageal reflux disease without esophagitis: Secondary | ICD-10-CM

## 2017-12-20 DIAGNOSIS — I2510 Atherosclerotic heart disease of native coronary artery without angina pectoris: Secondary | ICD-10-CM

## 2017-12-20 DIAGNOSIS — M47816 Spondylosis without myelopathy or radiculopathy, lumbar region: Secondary | ICD-10-CM | POA: Diagnosis not present

## 2017-12-20 DIAGNOSIS — E1169 Type 2 diabetes mellitus with other specified complication: Secondary | ICD-10-CM | POA: Diagnosis not present

## 2017-12-20 DIAGNOSIS — I10 Essential (primary) hypertension: Secondary | ICD-10-CM | POA: Diagnosis not present

## 2017-12-20 DIAGNOSIS — E785 Hyperlipidemia, unspecified: Secondary | ICD-10-CM

## 2017-12-20 LAB — POCT GLYCOSYLATED HEMOGLOBIN (HGB A1C)
Est. average glucose Bld gHb Est-mCnc: 154
HEMOGLOBIN A1C: 7

## 2017-12-20 MED ORDER — CLOPIDOGREL BISULFATE 75 MG PO TABS: 75.0000 mg | ORAL_TABLET | Freq: Every day | ORAL | 3 refills | Status: DC

## 2017-12-20 MED ORDER — SITAGLIPTIN PHOSPHATE 100 MG PO TABS: 100.0000 mg | ORAL_TABLET | Freq: Every day | ORAL | 3 refills | Status: DC

## 2017-12-20 MED ORDER — METOPROLOL TARTRATE 50 MG PO TABS: 50.0000 mg | ORAL_TABLET | Freq: Two times a day (BID) | ORAL | 3 refills | Status: DC

## 2017-12-20 MED ORDER — PANTOPRAZOLE SODIUM 40 MG PO TBEC: DELAYED_RELEASE_TABLET | ORAL | 3 refills | Status: DC

## 2017-12-20 MED ORDER — FENOFIBRATE 145 MG PO TABS: 145.0000 mg | ORAL_TABLET | Freq: Every day | ORAL | 3 refills | Status: DC

## 2017-12-20 MED ORDER — LISINOPRIL 5 MG PO TABS: 5.0000 mg | ORAL_TABLET | Freq: Every day | ORAL | 3 refills | Status: DC

## 2017-12-20 MED ORDER — HYDROCODONE-ACETAMINOPHEN 5-325 MG PO TABS: ORAL_TABLET | ORAL | 0 refills | Status: DC

## 2017-12-20 MED ORDER — ATORVASTATIN CALCIUM 80 MG PO TABS: ORAL_TABLET | ORAL | 3 refills | Status: DC

## 2017-12-20 MED ORDER — HYDROCHLOROTHIAZIDE 25 MG PO TABS: 25.0000 mg | ORAL_TABLET | Freq: Every day | ORAL | 3 refills | Status: DC

## 2017-12-20 MED ORDER — METFORMIN HCL 1000 MG PO TABS: ORAL_TABLET | ORAL | 3 refills | Status: DC

## 2017-12-20 NOTE — Progress Notes (Addendum)
Subjective:     Patient ID: Stephen LemmingJerry H Lazar, male   DOB: Nov 08, 1950, 67 y.o.   MRN: [id number] Chief Complaint  Patient presents with  . Diabetes    3 month follow up. No changes were made. Last HgbA1c was 6.8%  . Fall    Patient reports that he has had several falls within the last 3 weeks. He reports that he is still off balance.    HPI States he started getting episodes of dizziness/unsteadiness: "My eyes feel funny"-after a flu like illness 11/26/17. Denies syncope or vertigo. Has occurred on two occasions-once when he had been up for a while and once after he had arisen from sitting. Currently using hydrocodone up to twice daily. Accompanied by his daughter today. Needs refills on all medication due to pharmacy switch..  Review of Systems  Respiratory:       Reports he quit smoking cigars.       Objective:   Physical Exam  Constitutional: He appears well-developed and well-nourished. No distress.  Eyes:  Pupils equal with left reactive and right with decreased reactivity.EOM's intact except for mild right  exotropia  Cardiovascular: Normal rate and regular rhythm.  Pulmonary/Chest: Breath sounds normal.  Musculoskeletal: He exhibits no edema (of lower extremities).  M.S. In upper and lower extremities 5/5       Assessment:    1. Controlled type 2 diabetes mellitus without complication, without long-term current use of insulin (HCC) - POCT glycosylated hemoglobin (Hb A1C) - metFORMIN (GLUCOPHAGE) 1000 MG tablet; TAKE ONE (1) TABLET BY MOUTH TWO (2) TIMES DAILY WITH FOOD  Dispense: 180 tablet; Refill: 3 - sitaGLIPtin (JANUVIA) 100 MG tablet; Take 1 tablet (100 mg total) by mouth daily.  Dispense: 90 tablet; Refill: 3  2. Dizziness - Comprehensive metabolic panel  3. Spondylosis of lumbar region without myelopathy or radiculopathy - HYDROcodone-acetaminophen (NORCO/VICODIN) 5-325 MG tablet; Every 6 hours as needed for low back pain  Dispense: 60 tablet; Refill: 0  4.  Gastroesophageal reflux disease without esophagitis - pantoprazole (PROTONIX) 40 MG tablet; TAKE ONE (1) TABLET BY MOUTH EVERY DAY AS NEEDED FOR HEARTBURN OR ACID REFLUX  Dispense: 90 tablet; Refill: 3  5. Arteriosclerosis of coronary artery - metoprolol tartrate (LOPRESSOR) 50 MG tablet; Take 1 tablet (50 mg total) by mouth 2 (two) times daily.  Dispense: 180 tablet; Refill: 3 - clopidogrel (PLAVIX) 75 MG tablet; Take 1 tablet (75 mg total) by mouth daily.  Dispense: 90 tablet; Refill: 3  6. Essential hypertension - lisinopril (PRINIVIL,ZESTRIL) 5 MG tablet; Take 1 tablet (5 mg total) by mouth daily.  Dispense: 90 tablet; Refill: 3 - hydrochlorothiazide (HYDRODIURIL) 25 MG tablet; Take 1 tablet (25 mg total) by mouth daily.  Dispense: 90 tablet; Refill: 3  7. Hyperlipidemia associated with type 2 diabetes mellitus (HCC) - fenofibrate (TRICOR) 145 MG tablet; Take 1 tablet (145 mg total) by mouth daily.  Dispense: 90 tablet; Refill: 3 - atorvastatin (LIPITOR) 80 MG tablet; TAKE ONE (1) TABLET EACH DAY  Dispense: 90 tablet; Refill: 3    Plan:    Further f/u pending lab results. Decrease hydrocodone use. Update eye exam.

## 2017-12-20 NOTE — Patient Instructions (Signed)
We will call you with the lab results. Update your eye exam. Decrease hydrocodone to 1/2 pill or use Tylenol instead.

## 2017-12-21 ENCOUNTER — Telehealth: Payer: Self-pay

## 2017-12-21 LAB — COMPREHENSIVE METABOLIC PANEL
A/G RATIO: 2.1 (ref 1.2–2.2)
ALT: 22 IU/L (ref 0–44)
AST: 17 IU/L (ref 0–40)
Albumin: 4.7 g/dL (ref 3.6–4.8)
Alkaline Phosphatase: 30 IU/L — ABNORMAL LOW (ref 39–117)
BUN/Creatinine Ratio: 10 (ref 10–24)
BUN: 10 mg/dL (ref 8–27)
Bilirubin Total: 0.5 mg/dL (ref 0.0–1.2)
CALCIUM: 9.5 mg/dL (ref 8.6–10.2)
CO2: 25 mmol/L (ref 20–29)
Chloride: 101 mmol/L (ref 96–106)
Creatinine, Ser: 0.99 mg/dL (ref 0.76–1.27)
GFR, EST AFRICAN AMERICAN: 91 mL/min/{1.73_m2} (ref >59)
GFR, EST NON AFRICAN AMERICAN: 78 mL/min/{1.73_m2} (ref >59)
GLOBULIN, TOTAL: 2.2 g/dL (ref 1.5–4.5)
Glucose: 148 mg/dL — ABNORMAL HIGH (ref 65–99)
POTASSIUM: 3.4 mmol/L — AB (ref 3.5–5.2)
SODIUM: 143 mmol/L (ref 134–144)
Total Protein: 6.9 g/dL (ref 6.0–8.5)

## 2017-12-21 NOTE — Telephone Encounter (Signed)
-----   Message from Anola Gurneyobert Chauvin, [place] sent at 12/21/2017  7:25 AM EST ----- Labs are ok except mild decrease in potassium. Please add an over the counter potassium supplement daily. Proceed with eye exam and decreased hydrocodone.

## 2017-12-21 NOTE — Telephone Encounter (Signed)
Patient advised.KW 

## 2017-12-29 ENCOUNTER — Other Ambulatory Visit: Payer: Self-pay

## 2017-12-29 ENCOUNTER — Emergency Department: Payer: PPO

## 2017-12-29 ENCOUNTER — Encounter: Payer: Self-pay | Admitting: Emergency Medicine

## 2017-12-29 ENCOUNTER — Emergency Department
Admission: EM | Admit: 2017-12-29 | Discharge: 2017-12-29 | Disposition: A | Discharge status: Home / Self Care | Payer: PPO | Attending: Emergency Medicine | Admitting: Emergency Medicine

## 2017-12-29 DIAGNOSIS — W010XXA Fall on same level from slipping, tripping and stumbling without subsequent striking against object, initial encounter: Secondary | ICD-10-CM | POA: Diagnosis not present

## 2017-12-29 DIAGNOSIS — M549 Dorsalgia, unspecified: Secondary | ICD-10-CM | POA: Diagnosis not present

## 2017-12-29 DIAGNOSIS — Y999 Unspecified external cause status: Secondary | ICD-10-CM | POA: Diagnosis not present

## 2017-12-29 DIAGNOSIS — E0841 Diabetes mellitus due to underlying condition with diabetic mononeuropathy: Secondary | ICD-10-CM

## 2017-12-29 DIAGNOSIS — M79604 Pain in right leg: Secondary | ICD-10-CM | POA: Diagnosis present

## 2017-12-29 DIAGNOSIS — Y939 Activity, unspecified: Secondary | ICD-10-CM | POA: Diagnosis not present

## 2017-12-29 DIAGNOSIS — Z955 Presence of coronary angioplasty implant and graft: Secondary | ICD-10-CM | POA: Insufficient documentation

## 2017-12-29 DIAGNOSIS — I2510 Atherosclerotic heart disease of native coronary artery without angina pectoris: Secondary | ICD-10-CM | POA: Insufficient documentation

## 2017-12-29 DIAGNOSIS — Z7982 Long term (current) use of aspirin: Secondary | ICD-10-CM | POA: Diagnosis not present

## 2017-12-29 DIAGNOSIS — S199XXA Unspecified injury of neck, initial encounter: Secondary | ICD-10-CM | POA: Diagnosis not present

## 2017-12-29 DIAGNOSIS — Z87891 Personal history of nicotine dependence: Secondary | ICD-10-CM | POA: Diagnosis not present

## 2017-12-29 DIAGNOSIS — S0990XA Unspecified injury of head, initial encounter: Secondary | ICD-10-CM | POA: Diagnosis not present

## 2017-12-29 DIAGNOSIS — E1140 Type 2 diabetes mellitus with diabetic neuropathy, unspecified: Secondary | ICD-10-CM | POA: Diagnosis not present

## 2017-12-29 DIAGNOSIS — Y929 Unspecified place or not applicable: Secondary | ICD-10-CM | POA: Diagnosis not present

## 2017-12-29 DIAGNOSIS — Z79899 Other long term (current) drug therapy: Secondary | ICD-10-CM | POA: Insufficient documentation

## 2017-12-29 DIAGNOSIS — S3992XA Unspecified injury of lower back, initial encounter: Secondary | ICD-10-CM | POA: Diagnosis not present

## 2017-12-29 DIAGNOSIS — I10 Essential (primary) hypertension: Secondary | ICD-10-CM | POA: Insufficient documentation

## 2017-12-29 NOTE — ED Triage Notes (Addendum)
Pt reports a fall 2 weeks ago; bilateral lower arm pain since; also having lower leg pain for 2-3 weeks-aching pain in his shins; pt says he's here tonight because the pain is worse; has been having to use a cane since he fell because he's unsteady on his feet;  pt says he had a "knot" on right lower side of his back after the fall but that is now gone; pt reports no fall since then; talking in complete coherent sentences

## 2017-12-29 NOTE — ED Notes (Signed)
Patient reports symptoms for approximately 1 week.

## 2017-12-29 NOTE — Discharge Instructions (Signed)
Continue previous medications and follow-up with neurology clinic by calling for an appointment Monday morning.

## 2017-12-29 NOTE — ED Provider Notes (Signed)
[facility] Emergency Department Provider Note   ____________________________________________   First MD Initiated Contact with Patient 12/29/17 2154     (approximate)  I have reviewed the triage vital signs and the nursing notes.   HISTORY  Chief Complaint Leg Pain and Arm Pain    HPI [name] is a 67 y.o. male patient complaining of bilateral arm pain status post fall 2 weeks ago.  Patient also complain of bilateral leg pain.  Patient states headache and right upper extremity weakness.  Patient came today because he find himself is using a cane because he is unsteady on his feet.  Patient states intermittent vertigo but denies vision disturbance.  Patient states pain increased tonight.  Past Medical History:  Diagnosis Date  . Arthritis    osteoarthriis of back  . Coronary artery disease   . Depression   . Diabetes mellitus without complication ([place])   . GERD (gastroesophageal reflux disease)   . Hyperlipidemia   . Hypertension     Patient Active Problem List   Diagnosis Date Noted  . Spondylosis of lumbar region without myelopathy or radiculopathy 03/08/2016  . Arteriosclerosis of coronary artery 06/04/2015  . S/P coronary artery stent placement 06/04/2015  . CAD in native artery 06/04/2015  . Essential (primary) hypertension 05/28/2015  . HLD (hyperlipidemia) 05/28/2015  . Cannot sleep 05/28/2015  . 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 Gastro-esophageal reflux disease without esophagitis 05/28/2015  . Controlled type 2 diabetes mellitus without complication ([place]) 79/39/0300    Past Surgical History:  Procedure Laterality Date  . CATARACT EXTRACTION W/PHACO Right 03/01/2017   Procedure: CATARACT EXTRACTION PHACO AND INTRAOCULAR LENS PLACEMENT (IOC);  Surgeon: Eulogio Bear, MD;  Location: [facility] ORS;  Service: Ophthalmology;  Laterality: Right;  [place] 22.6 AP% 6.1 CDE 1.37 Fluid pack lot # [id number]  . CATARACT EXTRACTION W/PHACO  Left 05/01/2017   Procedure: CATARACT EXTRACTION PHACO AND INTRAOCULAR LENS PLACEMENT (IOC)  Left Diabetic;  Surgeon: Eulogio Bear, MD;  Location: [place];  Service: Ophthalmology;  Laterality: Left;  Block Diabetic  . CORONARY ANGIOPLASTY     STENT 2009  . Stint  2007    Prior to Admission medications   Medication Sig Start Date End Date Taking? Authorizing Provider  aspirin 81 MG tablet Take 81 mg by mouth daily.     [provider]  atorvastatin (LIPITOR) 80 MG tablet TAKE ONE (1) TABLET EACH DAY 12/20/17   Carmon Ginsberg, PA  blood glucose meter kit and supplies KIT Dispense based on patient and insurance preference. Check sugars once daily 11/24/15   Carmon Ginsberg, [place]  Calcium Citrate-Vitamin D (CALCIUM + D PO) Take 1 tablet by mouth daily.    [provider]  clopidogrel (PLAVIX) 75 MG tablet Take 1 tablet (75 mg total) by mouth daily. 12/20/17   Carmon Ginsberg, PA  fenofibrate (TRICOR) 145 MG tablet Take 1 tablet (145 mg total) by mouth daily. 12/20/17   Carmon Ginsberg, PA  hydrochlorothiazide (HYDRODIURIL) 25 MG tablet Take 1 tablet (25 mg total) by mouth daily. 12/20/17   Carmon Ginsberg, PA  HYDROcodone-acetaminophen (NORCO/VICODIN) 5-325 MG tablet Every 6 hours as needed for low back pain 12/20/17   Carmon Ginsberg, PA  lisinopril (PRINIVIL,ZESTRIL) 5 MG tablet Take 1 tablet (5 mg total) by mouth daily. 12/20/17   Carmon Ginsberg, PA  metFORMIN (GLUCOPHAGE) 1000 MG tablet TAKE ONE (1) TABLET BY MOUTH TWO (2) TIMES DAILY WITH FOOD 12/20/17  Carmon Ginsberg, PA  metoprolol tartrate (LOPRESSOR) 50 MG tablet Take 1 tablet (50 mg total) by mouth 2 (two) times daily. 12/20/17   Carmon Ginsberg, PA  [facility] VERIO test strip CHECK BLOOD SUGAR ONCE DAILY 09/04/17   Carmon Ginsberg, PA  pantoprazole (PROTONIX) 40 MG tablet TAKE ONE (1) TABLET BY MOUTH EVERY DAY AS NEEDED FOR HEARTBURN OR ACID REFLUX 12/20/17   Carmon Ginsberg, PA  Potassium 99 MG TABS Take 1  tablet by mouth daily.    [provider]  sitaGLIPtin (JANUVIA) 100 MG tablet Take 1 tablet (100 mg total) by mouth daily. 12/20/17   Carmon Ginsberg, PA  vitamin C (ASCORBIC ACID) 500 MG tablet Take 500 mg by mouth daily.    [provider]    Allergies Amoxicillin; Codeine; and Fexofenadine  Family History  Problem Relation Age of Onset  . Diabetes Mother        mellitus type2    Social History Social History   Tobacco Use  . Smoking status: Former Smoker    Types: Cigars  . Smokeless tobacco: Never Used  . Tobacco comment: smoker, Has been smoking for 12 years, smokes cigars; smokesabout 10-15 cigars a week(varies)  Substance Use Topics  . Alcohol use: No  . Drug use: No    Review of Systems Constitutional: No fever/chills Eyes: No visual changes. ENT: No sore throat. Cardiovascular: Denies chest pain. Respiratory: Denies shortness of breath. Gastrointestinal: No abdominal pain.  No nausea, no vomiting.  No diarrhea.  No constipation. Genitourinary: Negative for dysuria. Musculoskeletal: Upper and lower extremity pain. Skin: Negative for rash. Neurological: Negative for headaches, focal weakness or numbness. Endocrine:Diabetes, hyperlipidemia, and hypertension. Allergic/Immunilogical: Amoxil, codeine, and fexofenadine. ____________________________________________   PHYSICAL EXAM:  VITAL SIGNS: ED Triage Vitals  Enc Vitals Group     BP 12/29/17 2055 (!) 181/94     Pulse Rate 12/29/17 2055 89     Resp --      Temp 12/29/17 2055 97.8 F (36.6 C)     Temp Source 12/29/17 2055 Oral     SpO2 12/29/17 2055 96 %     Weight 12/29/17 2055 211 lb (95.7 kg)     Height 12/29/17 2055 6' (1.829 m)     Head Circumference --      Peak Flow --      Pain Score 12/29/17 2104 8     Pain Loc --      Pain Edu? --      Excl. in [place]? --    Constitutional: Alert and oriented. Well appearing and in no acute distress.  Neck: No stridor.  No cervical spine  tenderness to palpation. Hematological/Lymphatic/Immunilogical No cervical lymphadenopathy. Cardiovascular: Normal rate, regular rhythm. Grossly normal heart sounds.  Good peripheral circulation.  Elevated blood pressure. Respiratory: Normal respiratory effort.  No retractions. Lungs CTAB. Musculoskeletal: No obvious deformity of the upper and lower extremities.  Patient had full neck range of motion of the upper and lower extremities. Neurologic:  Normal speech and language. No gross focal neurologic deficits are appreciated. No gait instability. Skin:  Skin is warm, dry and intact. No rash noted. Psychiatric: Mood and affect are normal. Speech and behavior are normal.  ____________________________________________   LABS (all labs ordered are listed, but only abnormal results are displayed)  Labs Reviewed - No data to display ____________________________________________  EKG   ____________________________________________  RADIOLOGY  ED MD interpretation: No acute findings on CT of the head and neck.  Degenerative changes found in  the cervical spine.  No acute findings x-ray of the lumbar spine.  Degenerative changes of the lumbar spine..  Official radiology report(s): Dg Lumbar Spine 2-3 Views  Result Date: 12/29/2017 CLINICAL DATA:  Fall 2 weeks ago with back and lower leg pain, initial encounter EXAM: LUMBAR SPINE - 2 VIEW COMPARISON:  06/13/2014 FINDINGS: Five lumbar type vertebral bodies are well visualized. Vertebral body height is well maintained. Mild anterolisthesis of L4 on L5 is noted of a degenerative nature. This is stable from the prior exam. No soft tissue abnormality is noted. IMPRESSION: Degenerative changes without acute abnormality. Electronically Signed   By: Inez Catalina M.D.   On: 12/29/2017 22:20   Ct Head Wo Contrast  Result Date: 12/29/2017 CLINICAL DATA:  Fall with bilateral arm pain EXAM: CT HEAD WITHOUT CONTRAST CT CERVICAL SPINE WITHOUT CONTRAST TECHNIQUE:  Multidetector CT imaging of the head and cervical spine was performed following the standard protocol without intravenous contrast. Multiplanar CT image reconstructions of the cervical spine were also generated. COMPARISON:  None. FINDINGS: CT HEAD FINDINGS Brain: No evidence of acute infarction, hemorrhage, hydrocephalus, extra-axial collection or mass lesion/mass effect. Mild atrophy. Minimal small vessel ischemic changes of the white matter. Vascular: No hyperdense vessels. Scattered carotid artery calcification Skull: Normal. Negative for fracture or focal lesion. Sinuses/Orbits: No acute finding. Other: None CT CERVICAL SPINE FINDINGS Alignment: Trace anterolisthesis of C4 on C5, likely degenerative. Facet alignment within normal limits. Skull base and vertebrae: No acute fracture. No primary bone lesion or focal pathologic process. Soft tissues and spinal canal: No prevertebral fluid or swelling. No visible canal hematoma. Disc levels: Moderate-to-marked degenerative changes at C5-C6 and C6-C7 with moderate degenerative changes at C4-C5 and mild degenerative changes at C3-C4. Posterior disc osteophyte at C3-C4, C5-C6 and C6-C7. Multiple level bilateral facet hypertrophic arthropathy. Diffuse foraminal stenosis of the mid cervical spine. Upper chest: Lung apices clear.  No thyroid mass. Other: None IMPRESSION: 1. No CT evidence for acute intracranial abnormality. Mild atrophy and minimal small vessel ischemic changes of the white matter 2. Trace anterolisthesis of C4 on C5 likely degenerative. No definite acute osseous abnormality is seen. Electronically Signed   By: Donavan Foil M.D.   On: 12/29/2017 22:34   Ct Cervical Spine Wo Contrast  Result Date: 12/29/2017 CLINICAL DATA:  Fall with bilateral arm pain EXAM: CT HEAD WITHOUT CONTRAST CT CERVICAL SPINE WITHOUT CONTRAST TECHNIQUE: Multidetector CT imaging of the head and cervical spine was performed following the standard protocol without intravenous  contrast. Multiplanar CT image reconstructions of the cervical spine were also generated. COMPARISON:  None. FINDINGS: CT HEAD FINDINGS Brain: No evidence of acute infarction, hemorrhage, hydrocephalus, extra-axial collection or mass lesion/mass effect. Mild atrophy. Minimal small vessel ischemic changes of the white matter. Vascular: No hyperdense vessels. Scattered carotid artery calcification Skull: Normal. Negative for fracture or focal lesion. Sinuses/Orbits: No acute finding. Other: None CT CERVICAL SPINE FINDINGS Alignment: Trace anterolisthesis of C4 on C5, likely degenerative. Facet alignment within normal limits. Skull base and vertebrae: No acute fracture. No primary bone lesion or focal pathologic process. Soft tissues and spinal canal: No prevertebral fluid or swelling. No visible canal hematoma. Disc levels: Moderate-to-marked degenerative changes at C5-C6 and C6-C7 with moderate degenerative changes at C4-C5 and mild degenerative changes at C3-C4. Posterior disc osteophyte at C3-C4, C5-C6 and C6-C7. Multiple level bilateral facet hypertrophic arthropathy. Diffuse foraminal stenosis of the mid cervical spine. Upper chest: Lung apices clear.  No thyroid mass. Other: None IMPRESSION: 1. No CT  evidence for acute intracranial abnormality. Mild atrophy and minimal small vessel ischemic changes of the white matter 2. Trace anterolisthesis of C4 on C5 likely degenerative. No definite acute osseous abnormality is seen. Electronically Signed   By: Donavan Foil M.D.   On: 12/29/2017 22:34    ____________________________________________   PROCEDURES  Procedure(s) performed: None  Procedures  Critical Care performed: No  ____________________________________________   INITIAL IMPRESSION / ASSESSMENT AND PLAN / ED COURSE  As part of my medical decision making, I reviewed the following data within the electronic MEDICAL RECORD NUMBER    There are 3 to the upper and lower extremities secondary to  diabetes.  Discussed no acute findings found on CT of the head and neck.  No acute findings on x-ray of the lumbar spine.  Patient advised continue previous medication and to contact neurology department for appointment for definitive evaluation and treatment.      ____________________________________________   FINAL CLINICAL IMPRESSION(S) / ED DIAGNOSES  Final diagnoses:  Diabetic mononeuropathy associated with diabetes mellitus due to underlying condition [facility])     ED Discharge Orders    None       Note:  This document was prepared using Dragon voice recognition software and may include unintentional dictation errors.    Sable Feil, PA-C 12/29/17 [place], [place], MD 01/03/18 [phone]

## 2017-12-31 ENCOUNTER — Ambulatory Visit: Payer: PPO | Admitting: Family Medicine

## 2018-01-01 ENCOUNTER — Other Ambulatory Visit: Payer: Self-pay

## 2018-01-02 ENCOUNTER — Other Ambulatory Visit: Payer: Self-pay

## 2018-01-02 NOTE — Patient Outreach (Signed)
[facility]) Care Management  01/02/2018  [name] 08/18/1951 [id number]   Telephone Screen  Referral Date: 01/02/18 Referral Source: Patient engagement Tool Referral Reason: recent ED visit on 12/29/17 Insurance: HTA   Outreach attempt # 1  to patient. No answer at present and unable to leave message.     Plan: RN CM will send unsuccessful outreach letter to patient and make outreach attempt to patient within three business days.  Antionette Fairyoshanda Kamiah Fite, RN,BSN,CCM [facility] Care [facility] Telephonic Care Management Coordinator Direct Phone: [phone] Toll Free: [phone] Fax: [phone]

## 2018-01-07 ENCOUNTER — Other Ambulatory Visit: Payer: Self-pay

## 2018-01-07 NOTE — Patient Outreach (Signed)
[facility]) Care Management  01/07/2018  [name] 10/22/1951 [id number]     Telephone Screen  Referral Date: 01/02/18 Referral Source: Patient engagement Tool Referral Reason: recent ED visit on 12/29/17 Insurance: HTA     Outreach attempt # 2  to patient. No answer at present and unable to leave message.       Plan: RN CM will make outreach attempt to patient within three business days.   [name], RN,BSN,CCM [facility] Care Management Telephonic Care Management Coordinator Direct Phone: [phone] Toll Free: [phone] Fax: [phone]

## 2018-01-09 ENCOUNTER — Other Ambulatory Visit: Payer: Self-pay

## 2018-01-09 NOTE — Patient Outreach (Signed)
[facility] Care Management  01/09/2018  Alecia LemmingJerry H Kosar Apr 26, 1951 [id number]   Telephone Screen  Referral Date:01/02/18 Referral Source:Patient Engagement Tool Referral Reason:recent ED visit on 12/29/17 Insurance:HTA     Outreach attempt #3 to patient. No answer at present and unable to leave message.      Plan: RN CM has sent unsuccessful outreach letter to patient and will close case out if no response from patient.     Antionette Fairyoshanda Teja Judice, RN,BSN,CCM [facility] Care Management Telephonic Care Management Coordinator Direct Phone: [phone] Toll Free: [phone] Fax: [phone]

## 2018-01-15 ENCOUNTER — Other Ambulatory Visit: Payer: Self-pay

## 2018-01-15 NOTE — Patient Outreach (Signed)
[facility] Care Management  01/15/2018  Alecia LemmingJerry H Nease 18-Sep-1951 [id number]   Telephone Screen  Referral Date:01/02/18 Referral Source:Patient Engagement Tool Referral Reason:recent ED visit on 12/29/17 Insurance:HTA    Multiple attempts to establish contact with patient without success. No response from letter mailed to patient. Case is being closed at this time.     Plan: RN CM will close case at this time.    Antionette Fairyoshanda Shadell Brenn, RN,BSN,CCM [facility] Care [facility] Telephonic Care Management Coordinator Direct Phone: [phone] Toll Free: [phone] Fax: [phone]

## 2018-01-16 ENCOUNTER — Encounter: Payer: Self-pay | Admitting: Family Medicine

## 2018-01-16 ENCOUNTER — Ambulatory Visit (INDEPENDENT_AMBULATORY_CARE_PROVIDER_SITE_OTHER): Payer: PPO | Admitting: Family Medicine

## 2018-01-16 VITALS — BP 130/86 | HR 75 | Temp 97.7°F | Resp 15 | Wt 204.2 lb

## 2018-01-16 DIAGNOSIS — M6281 Muscle weakness (generalized): Secondary | ICD-10-CM | POA: Diagnosis not present

## 2018-01-16 DIAGNOSIS — M791 Myalgia, unspecified site: Secondary | ICD-10-CM | POA: Diagnosis not present

## 2018-01-16 DIAGNOSIS — M47816 Spondylosis without myelopathy or radiculopathy, lumbar region: Secondary | ICD-10-CM

## 2018-01-16 MED ORDER — HYDROCODONE-ACETAMINOPHEN 5-325 MG PO TABS: ORAL_TABLET | ORAL | 0 refills | Status: DC

## 2018-01-16 NOTE — Patient Instructions (Signed)
Hold atorvastatin and fenofibrate.We will call you with the lab results.

## 2018-01-16 NOTE — Progress Notes (Signed)
Subjective:     Patient ID: [name], male   DOB: [birth year], 67 y.o.   MRN: [id number] Chief Complaint  Patient presents with  . Diabetes    Patient returns to office today for follow up after being seen in ED 12/29/17 for bbilateral arm and leg pain and weakness in arms, discharging diagnosis diabetic mononeuropathy. Patient reports that he has had difficulty walking and gripping items, patient states " it feells like my feet are on fire". Patient reports weakness in both arms and instablity having to use cane for support. Patient is unable to get in with Dr. Clelia CroftShaw until 02/08/18.    HPI States he has fallen a few times withot injury;. Usually active without use of an assistive device. Accompanied by his daughter today.  Review of Systems     Objective:   Physical Exam  Constitutional: He appears well-developed and well-nourished. No distress.  Musculoskeletal:  Cervical FROM without pain or exacerbation of his sx. Grip strength/EF/EE 4/5- bilaterally. Shoulder abduction 5/5. Lower extremity strength: HF/HE 4/5. KF 3/5, DF 3/5, PF 4/5 -.  Psychiatric:  Frustrated by these new disabilities       Assessment:    1. Myalgia - Comprehensive metabolic panel - CK (Creatine Kinase)  2. Muscle weakness - Comprehensive metabolic panel - CK (Creatine Kinase)  3. Spondylosis of lumbar region without myelopathy or radiculopathy - HYDROcodone-acetaminophen (NORCO/VICODIN) 5-325 MG tablet; Every 6 hours as needed for low back pain  Dispense: 60 tablet; Refill: 0    Plan:    Hold atorvastatin and fenofibrate pending lab results. F/u with neurology as scheduled (concern for demyelinating dz) Consider gabapentin for dysesthesias.

## 2018-01-17 ENCOUNTER — Other Ambulatory Visit: Payer: Self-pay | Admitting: Family Medicine

## 2018-01-17 LAB — COMPREHENSIVE METABOLIC PANEL
ALBUMIN: 4.7 g/dL (ref 3.6–4.8)
ALT: 20 IU/L (ref 0–44)
AST: 15 IU/L (ref 0–40)
Albumin/Globulin Ratio: 2 (ref 1.2–2.2)
Alkaline Phosphatase: 29 IU/L — ABNORMAL LOW (ref 39–117)
BUN / CREAT RATIO: 14 (ref 10–24)
BUN: 13 mg/dL (ref 8–27)
Bilirubin Total: 0.3 mg/dL (ref 0.0–1.2)
CALCIUM: 10.5 mg/dL — AB (ref 8.6–10.2)
CHLORIDE: 102 mmol/L (ref 96–106)
CO2: 24 mmol/L (ref 20–29)
CREATININE: 0.91 mg/dL (ref 0.76–1.27)
GFR, EST AFRICAN AMERICAN: 100 mL/min/{1.73_m2} (ref >59)
GFR, EST NON AFRICAN AMERICAN: 87 mL/min/{1.73_m2} (ref >59)
GLUCOSE: 221 mg/dL — AB (ref 65–99)
Globulin, Total: 2.4 g/dL (ref 1.5–4.5)
Potassium: 3.9 mmol/L (ref 3.5–5.2)
Sodium: 141 mmol/L (ref 134–144)
TOTAL PROTEIN: 7.1 g/dL (ref 6.0–8.5)

## 2018-01-17 LAB — CK: Total CK: 105 U/L (ref 24–204)

## 2018-01-17 MED ORDER — GABAPENTIN 300 MG PO CAPS
300.0000 mg | ORAL_CAPSULE | Freq: Every day | ORAL | 0 refills | Status: DC
Start: 2018-01-17 — End: 2018-02-06

## 2018-01-26 DIAGNOSIS — M4712 Other spondylosis with myelopathy, cervical region: Secondary | ICD-10-CM | POA: Diagnosis not present

## 2018-02-06 ENCOUNTER — Other Ambulatory Visit: Payer: Self-pay | Admitting: Family Medicine

## 2018-02-06 ENCOUNTER — Other Ambulatory Visit: Payer: Self-pay | Admitting: Neurology

## 2018-02-06 DIAGNOSIS — R7309 Other abnormal glucose: Secondary | ICD-10-CM | POA: Diagnosis not present

## 2018-02-06 DIAGNOSIS — R292 Abnormal reflex: Secondary | ICD-10-CM

## 2018-02-06 DIAGNOSIS — R29898 Other symptoms and signs involving the musculoskeletal system: Secondary | ICD-10-CM | POA: Diagnosis not present

## 2018-02-06 DIAGNOSIS — E538 Deficiency of other specified B group vitamins: Secondary | ICD-10-CM | POA: Diagnosis not present

## 2018-02-06 DIAGNOSIS — R202 Paresthesia of skin: Secondary | ICD-10-CM | POA: Diagnosis not present

## 2018-02-06 DIAGNOSIS — E559 Vitamin D deficiency, unspecified: Secondary | ICD-10-CM | POA: Diagnosis not present

## 2018-02-06 DIAGNOSIS — R200 Anesthesia of skin: Secondary | ICD-10-CM | POA: Diagnosis not present

## 2018-02-13 ENCOUNTER — Ambulatory Visit
Admission: RE | Admit: 2018-02-13 | Discharge: 2018-02-13 | Disposition: A | Discharge status: Home / Self Care | Payer: PPO | Source: Ambulatory Visit | Attending: Neurology | Admitting: Neurology

## 2018-02-13 DIAGNOSIS — M4802 Spinal stenosis, cervical region: Secondary | ICD-10-CM | POA: Diagnosis not present

## 2018-02-13 DIAGNOSIS — M5021 Other cervical disc displacement,  high cervical region: Secondary | ICD-10-CM | POA: Insufficient documentation

## 2018-02-13 DIAGNOSIS — M8938 Hypertrophy of bone, other site: Secondary | ICD-10-CM | POA: Diagnosis not present

## 2018-02-13 DIAGNOSIS — R292 Abnormal reflex: Secondary | ICD-10-CM

## 2018-02-13 DIAGNOSIS — S199XXA Unspecified injury of neck, initial encounter: Secondary | ICD-10-CM | POA: Diagnosis not present

## 2018-02-13 DIAGNOSIS — M542 Cervicalgia: Secondary | ICD-10-CM | POA: Diagnosis not present

## 2018-02-19 DIAGNOSIS — M4312 Spondylolisthesis, cervical region: Secondary | ICD-10-CM | POA: Diagnosis not present

## 2018-02-19 DIAGNOSIS — M4802 Spinal stenosis, cervical region: Secondary | ICD-10-CM | POA: Diagnosis not present

## 2018-02-19 DIAGNOSIS — M4712 Other spondylosis with myelopathy, cervical region: Secondary | ICD-10-CM | POA: Diagnosis not present

## 2018-02-20 ENCOUNTER — Telehealth: Payer: Self-pay | Admitting: Family Medicine

## 2018-02-20 NOTE — Telephone Encounter (Signed)
Pt's daughter [name] contacted office for refill request on the following medications:  HYDROcodone-acetaminophen (NORCO/VICODIN) 5-325 MG tablet  Verda Cumins  Last Rx: 01/16/18 LOV: 01/16/18 Please advise. Thanks TNP

## 2018-02-21 ENCOUNTER — Other Ambulatory Visit: Payer: Self-pay

## 2018-02-21 ENCOUNTER — Encounter
Admission: RE | Admit: 2018-02-21 | Discharge: 2018-02-21 | Disposition: A | Discharge status: Home / Self Care | Payer: PPO | Source: Ambulatory Visit | Attending: Neurological Surgery | Admitting: Neurological Surgery

## 2018-02-21 ENCOUNTER — Other Ambulatory Visit: Payer: Self-pay | Admitting: Family Medicine

## 2018-02-21 ENCOUNTER — Ambulatory Visit
Admission: RE | Admit: 2018-02-21 | Discharge: 2018-02-21 | Disposition: A | Discharge status: Home / Self Care | Payer: PPO | Source: Ambulatory Visit | Attending: Neurological Surgery | Admitting: Neurological Surgery

## 2018-02-21 ENCOUNTER — Inpatient Hospital Stay: Admission: RE | Admit: 2018-02-21 | Payer: PPO | Source: Ambulatory Visit

## 2018-02-21 DIAGNOSIS — I10 Essential (primary) hypertension: Secondary | ICD-10-CM

## 2018-02-21 DIAGNOSIS — M47816 Spondylosis without myelopathy or radiculopathy, lumbar region: Secondary | ICD-10-CM

## 2018-02-21 DIAGNOSIS — I2510 Atherosclerotic heart disease of native coronary artery without angina pectoris: Secondary | ICD-10-CM

## 2018-02-21 LAB — CBC
HCT: 41.2 % (ref 40.0–52.0)
Hemoglobin: 14 g/dL (ref 13.0–18.0)
MCH: 30 pg (ref 26.0–34.0)
MCHC: 33.9 g/dL (ref 32.0–36.0)
MCV: 88.6 fL (ref 80.0–100.0)
Platelets: 245 K/uL (ref 150–440)
RBC: 4.66 MIL/uL (ref 4.40–5.90)
RDW: 15 % — ABNORMAL HIGH (ref 11.5–14.5)
WBC: 8.2 K/uL (ref 3.8–10.6)

## 2018-02-21 LAB — URINALYSIS, ROUTINE W REFLEX MICROSCOPIC
BILIRUBIN URINE: NEGATIVE
Glucose, UA: NEGATIVE mg/dL
HGB URINE DIPSTICK: NEGATIVE
Ketones, ur: NEGATIVE mg/dL
Leukocytes, UA: NEGATIVE
Nitrite: NEGATIVE
PH: 6 (ref 5.0–8.0)
Protein, ur: NEGATIVE mg/dL
SPECIFIC GRAVITY, URINE: 1.006 (ref 1.005–1.030)

## 2018-02-21 LAB — BASIC METABOLIC PANEL WITH GFR
Anion gap: 9 (ref 5–15)
BUN: 15 mg/dL (ref 6–20)
CO2: 27 mmol/L (ref 22–32)
Calcium: 10.1 mg/dL (ref 8.9–10.3)
Chloride: 103 mmol/L (ref 101–111)
Creatinine, Ser: 0.83 mg/dL (ref 0.61–1.24)
GFR calc Af Amer: >60 mL/min (ref >60)
GFR calc non Af Amer: >60 mL/min (ref >60)
Glucose, Bld: 129 mg/dL — ABNORMAL HIGH (ref 65–99)
Potassium: 3.1 mmol/L — ABNORMAL LOW (ref 3.5–5.1)
Sodium: 139 mmol/L (ref 135–145)

## 2018-02-21 LAB — SURGICAL PCR SCREEN
MRSA, PCR: NEGATIVE
Staphylococcus aureus: NEGATIVE

## 2018-02-21 LAB — TYPE AND SCREEN
ABO/RH(D): O NEG
Antibody Screen: NEGATIVE

## 2018-02-21 LAB — APTT: aPTT: 32 s (ref 24–36)

## 2018-02-21 LAB — PROTIME-INR
INR: 1
Prothrombin Time: 13.1 s (ref 11.4–15.2)

## 2018-02-21 MED ORDER — HYDROCODONE-ACETAMINOPHEN 5-325 MG PO TABS: ORAL_TABLET | ORAL | 0 refills | Status: DC

## 2018-02-21 NOTE — Patient Instructions (Signed)
  Your procedure is scheduled on: Monday Feb 25, 2018 Report to Same Day Surgery 2nd floor medical mall (Medical Mall Entrance-take elevator on left to 2nd floor.  Check in with surgery information desk.) To find out your arrival time please call [phone] between 1PM - 3PM on Friday Feb 22, 2018  Remember: Instructions that are not followed completely may result in serious medical risk, up to and including death, or upon the discretion of your surgeon and anesthesiologist your surgery may need to be rescheduled.    _x___ 1. Do not eat food after midnight the night before your procedure. You may drink water up to 2 hours before you are scheduled to arrive at the hospital for your procedure.  Do not drink anything within 2 hours of your scheduled arrival to the hospital.   No gum chewing or hard candies.      __x__ 2. No Alcohol for 24 hours before or after surgery.   __x__3. No Smoking or e-cigarettes for 24 prior to surgery.  Do not use any chewable tobacco products for at least 6 hour prior to surgery   ____  4. Bring all medications with you on the day of surgery if instructed.    __x__ 5. Notify your doctor if there is any change in your medical condition     (cold, fever, infections).   __x__6. On the morning of surgery brush your teeth with toothpaste and water.  You may rinse your mouth with mouth wash if you wish.  Do not swallow any toothpaste or mouthwash.   Do not wear jewelry.  Do not wear lotions, powders, deodorant, or perfumes.   Do not shave 48 hours prior to surgery.   Do not bring valuables to the hospital.    [facility] is not responsible for any belongings or valuables.               Contacts, dentures or bridgework may not be worn into surgery.  Leave your suitcase in the car. After surgery it may be brought to your room.  For patients admitted to the hospital, discharge time is determined by your treatment team.  Please read over the following fact sheets  that you were given:   [facility] Preparing for Surgery and or MRSA Information   _x___ Take anti-hypertensive listed below, cardiac, seizure, asthma, anti-reflux and psychiatric medicines. These include:  1. Atorvastatin/Lipitor  2. Fenofibrate/Tricor  3. Gabapentin/Neurontin  4. Norco/Vicodin  5. Metoprolol/Lopressor  6. Pantoprazole/Protonix  _x___ Use CHG Soap or sage wipes as directed on instruction sheet   _x___ Stop Metformin and Janumet 2 days prior to surgery, Saturday Feb 23, 2018.    _x___ Follow recommendations from Cardiologist, Pulmonologist or PCP regarding stopping Aspirin, Coumadin, Plavix ,Eliquis, Effient, or Pradaxa, and Pletal.  _x___Stop Anti-inflammatories such as Advil, Aleve, Ibuprofen, Motrin, Naproxen, Naprosyn, Goodies powders or aspirin products. OK to take Tylenol and Celebrex.   _x___ Stop supplements until after surgery.  But may continue Vitamin D, Vitamin B, and multivitamin.

## 2018-02-21 NOTE — Telephone Encounter (Signed)
done

## 2018-02-21 NOTE — Telephone Encounter (Signed)
Please review. KW 

## 2018-02-22 NOTE — Progress Notes (Signed)
Pharmacy Antibiotic Note  [name] is a 67 y.o. male admitted on (Not on file) with periop antibiotics needed.  Pharmacy has been consulted for vancomycin dosing.  Plan:  Will input a single dose of Vancomycin  IV. Order signed and held    No data recorded.  Recent Labs  Lab 02/21/18 1444  WBC 8.2  CREATININE 0.83    Estimated Creatinine Clearance: 94.8 mL/min (by C-G formula based on SCr of 0.83 mg/dL).    Allergies  Allergen Reactions  . Amoxicillin     diarrhea and headache Has patient had a PCN reaction causing immediate rash, facial/tongue/throat swelling, SOB or lightheadedness with hypotension: No Has patient had a PCN reaction causing severe rash involving mucus membranes or skin necrosis: No Has patient had a PCN reaction that required hospitalization: No Has patient had a PCN reaction occurring within the last 10 years: Unknown If all of the above answers are "NO", then may proceed with Cephalosporin use.   Marland Kitchen Fexofenadine     Unknown reaction     Antimicrobials this admission: Vancomycin 5/3  Microbiology results: None  Thank you for allowing pharmacy to be a part of this patient's care.  Lowella Bandy 02/22/2018 6:24 PM

## 2018-02-25 ENCOUNTER — Inpatient Hospital Stay
Admission: RE | Admit: 2018-02-25 | Discharge: 2018-02-27 | DRG: 520 | Disposition: A | Discharge status: Home Health | Payer: PPO | Attending: Neurological Surgery | Admitting: Neurological Surgery

## 2018-02-25 ENCOUNTER — Encounter
Admission: RE | Disposition: A | Discharge status: Home Health | Payer: Self-pay | Source: Home / Self Care | Attending: Internal Medicine

## 2018-02-25 ENCOUNTER — Other Ambulatory Visit: Payer: Self-pay

## 2018-02-25 ENCOUNTER — Inpatient Hospital Stay: Payer: PPO | Admitting: Anesthesiology

## 2018-02-25 ENCOUNTER — Inpatient Hospital Stay: Payer: PPO

## 2018-02-25 DIAGNOSIS — H409 Unspecified glaucoma: Secondary | ICD-10-CM | POA: Diagnosis not present

## 2018-02-25 DIAGNOSIS — Z7902 Long term (current) use of antithrombotics/antiplatelets: Secondary | ICD-10-CM

## 2018-02-25 DIAGNOSIS — I10 Essential (primary) hypertension: Secondary | ICD-10-CM | POA: Diagnosis not present

## 2018-02-25 DIAGNOSIS — Z7982 Long term (current) use of aspirin: Secondary | ICD-10-CM

## 2018-02-25 DIAGNOSIS — E876 Hypokalemia: Secondary | ICD-10-CM | POA: Diagnosis not present

## 2018-02-25 DIAGNOSIS — I160 Hypertensive urgency: Secondary | ICD-10-CM

## 2018-02-25 DIAGNOSIS — Z8249 Family history of ischemic heart disease and other diseases of the circulatory system: Secondary | ICD-10-CM

## 2018-02-25 DIAGNOSIS — Z833 Family history of diabetes mellitus: Secondary | ICD-10-CM | POA: Diagnosis not present

## 2018-02-25 DIAGNOSIS — Z7984 Long term (current) use of oral hypoglycemic drugs: Secondary | ICD-10-CM

## 2018-02-25 DIAGNOSIS — E785 Hyperlipidemia, unspecified: Secondary | ICD-10-CM | POA: Diagnosis present

## 2018-02-25 DIAGNOSIS — F17290 Nicotine dependence, other tobacco product, uncomplicated: Secondary | ICD-10-CM | POA: Diagnosis not present

## 2018-02-25 DIAGNOSIS — Z955 Presence of coronary angioplasty implant and graft: Secondary | ICD-10-CM

## 2018-02-25 DIAGNOSIS — Z888 Allergy status to other drugs, medicaments and biological substances status: Secondary | ICD-10-CM

## 2018-02-25 DIAGNOSIS — E119 Type 2 diabetes mellitus without complications: Secondary | ICD-10-CM | POA: Diagnosis not present

## 2018-02-25 DIAGNOSIS — Z885 Allergy status to narcotic agent status: Secondary | ICD-10-CM

## 2018-02-25 DIAGNOSIS — Z981 Arthrodesis status: Secondary | ICD-10-CM | POA: Diagnosis not present

## 2018-02-25 DIAGNOSIS — M4802 Spinal stenosis, cervical region: Secondary | ICD-10-CM | POA: Diagnosis present

## 2018-02-25 DIAGNOSIS — M1990 Unspecified osteoarthritis, unspecified site: Secondary | ICD-10-CM | POA: Diagnosis not present

## 2018-02-25 DIAGNOSIS — Z79891 Long term (current) use of opiate analgesic: Secondary | ICD-10-CM

## 2018-02-25 DIAGNOSIS — G959 Disease of spinal cord, unspecified: Secondary | ICD-10-CM | POA: Diagnosis not present

## 2018-02-25 DIAGNOSIS — Z79899 Other long term (current) drug therapy: Secondary | ICD-10-CM

## 2018-02-25 DIAGNOSIS — M79643 Pain in unspecified hand: Secondary | ICD-10-CM | POA: Diagnosis present

## 2018-02-25 DIAGNOSIS — H548 Legal blindness, as defined in USA: Secondary | ICD-10-CM | POA: Diagnosis present

## 2018-02-25 DIAGNOSIS — M4712 Other spondylosis with myelopathy, cervical region: Secondary | ICD-10-CM | POA: Diagnosis not present

## 2018-02-25 DIAGNOSIS — Z419 Encounter for procedure for purposes other than remedying health state, unspecified: Secondary | ICD-10-CM

## 2018-02-25 DIAGNOSIS — Z880 Allergy status to penicillin: Secondary | ICD-10-CM | POA: Diagnosis not present

## 2018-02-25 DIAGNOSIS — H5789 Other specified disorders of eye and adnexa: Secondary | ICD-10-CM | POA: Diagnosis present

## 2018-02-25 DIAGNOSIS — I2510 Atherosclerotic heart disease of native coronary artery without angina pectoris: Secondary | ICD-10-CM | POA: Diagnosis not present

## 2018-02-25 DIAGNOSIS — G9520 Unspecified cord compression: Secondary | ICD-10-CM | POA: Diagnosis not present

## 2018-02-25 DIAGNOSIS — K219 Gastro-esophageal reflux disease without esophagitis: Secondary | ICD-10-CM | POA: Diagnosis not present

## 2018-02-25 HISTORY — PX: POSTERIOR CERVICAL LAMINECTOMY: SHX2248

## 2018-02-25 LAB — POCT I-STAT 4, (NA,K, GLUC, HGB,HCT)
Glucose, Bld: 184 mg/dL — ABNORMAL HIGH (ref 65–99)
HCT: 41 % (ref 39.0–52.0)
Hemoglobin: 13.9 g/dL (ref 13.0–17.0)
POTASSIUM: 3.6 mmol/L (ref 3.5–5.1)
Sodium: 143 mmol/L (ref 135–145)

## 2018-02-25 LAB — ABO/RH: ABO/RH(D): O NEG

## 2018-02-25 LAB — GLUCOSE, CAPILLARY
GLUCOSE-CAPILLARY: 183 mg/dL — AB (ref 65–99)
Glucose-Capillary: 180 mg/dL — ABNORMAL HIGH (ref 65–99)
Glucose-Capillary: 190 mg/dL — ABNORMAL HIGH (ref 65–99)
Glucose-Capillary: 213 mg/dL — ABNORMAL HIGH (ref 65–99)

## 2018-02-25 SURGERY — POSTERIOR CERVICAL LAMINECTOMY
Anesthesia: General | Site: Spine Cervical | Wound class: Clean

## 2018-02-25 MED ORDER — ACETAMINOPHEN 10 MG/ML IV SOLN
INTRAVENOUS | Status: AC
  Filled 2018-02-25: qty 100

## 2018-02-25 MED ORDER — VANCOMYCIN HCL 10 G IV SOLR
1500.0000 mg | Freq: Two times a day (BID) | INTRAVENOUS | Status: AC
Start: 2018-02-25 — End: 2018-02-26
  Administered 2018-02-25 – 2018-02-26 (×2): 1500 mg via INTRAVENOUS
  Filled 2018-02-25 (×2): qty 1500

## 2018-02-25 MED ORDER — SODIUM CHLORIDE 0.9 % IV SOLN: 250.0000 mL | INTRAVENOUS | Status: DC

## 2018-02-25 MED ORDER — PROPOFOL 10 MG/ML IV BOLUS
INTRAVENOUS | Status: AC
  Filled 2018-02-25: qty 20

## 2018-02-25 MED ORDER — NON FORMULARY: 145.0000 mg | Freq: Every day | Status: DC

## 2018-02-25 MED ORDER — HYDRALAZINE HCL 20 MG/ML IJ SOLN
5.0000 mg | INTRAMUSCULAR | Status: DC | PRN
  Administered 2018-02-25: 5 mg via INTRAVENOUS

## 2018-02-25 MED ORDER — METFORMIN HCL 500 MG PO TABS
1000.0000 mg | ORAL_TABLET | Freq: Two times a day (BID) | ORAL | Status: DC
  Administered 2018-02-26 – 2018-02-27 (×3): 1000 mg via ORAL
  Filled 2018-02-25 (×4): qty 2

## 2018-02-25 MED ORDER — ONDANSETRON HCL 4 MG/2ML IJ SOLN
INTRAMUSCULAR | Status: DC | PRN
  Administered 2018-02-25: 4 mg via INTRAVENOUS

## 2018-02-25 MED ORDER — HYDROCHLOROTHIAZIDE 25 MG PO TABS
25.0000 mg | ORAL_TABLET | Freq: Every day | ORAL | Status: DC
  Administered 2018-02-26: 25 mg via ORAL
  Filled 2018-02-25: qty 1

## 2018-02-25 MED ORDER — FENTANYL CITRATE (PF) 100 MCG/2ML IJ SOLN
INTRAMUSCULAR | Status: AC
  Filled 2018-02-25: qty 2

## 2018-02-25 MED ORDER — GELATIN ABSORBABLE 12-7 MM EX MISC
CUTANEOUS | Status: AC
  Filled 2018-02-25: qty 1

## 2018-02-25 MED ORDER — HYDRALAZINE HCL 20 MG/ML IJ SOLN
5.0000 mg | Freq: Once | INTRAMUSCULAR | Status: AC
  Administered 2018-02-25: 5 mg via INTRAVENOUS

## 2018-02-25 MED ORDER — SODIUM CHLORIDE 0.9 % IV SOLN
INTRAVENOUS | Status: DC | PRN
  Administered 2018-02-25: 08:00:00 via INTRAVENOUS

## 2018-02-25 MED ORDER — MEPERIDINE HCL 50 MG/ML IJ SOLN: 6.2500 mg | INTRAMUSCULAR | Status: DC | PRN

## 2018-02-25 MED ORDER — LIDOCAINE HCL (PF) 2 % IJ SOLN
INTRAMUSCULAR | Status: AC
  Filled 2018-02-25: qty 10

## 2018-02-25 MED ORDER — SODIUM CHLORIDE FLUSH 0.9 % IV SOLN
INTRAVENOUS | Status: AC
Start: 2018-02-25 — End: 2018-02-25
  Filled 2018-02-25: qty 10

## 2018-02-25 MED ORDER — REMIFENTANIL HCL 1 MG IV SOLR
INTRAVENOUS | Status: DC | PRN
  Administered 2018-02-25: .5 ug/kg/min via INTRAVENOUS

## 2018-02-25 MED ORDER — SODIUM CHLORIDE 0.9 % IV SOLN
INTRAVENOUS | Status: DC
  Administered 2018-02-25 (×2): via INTRAVENOUS

## 2018-02-25 MED ORDER — ATORVASTATIN CALCIUM 20 MG PO TABS
80.0000 mg | ORAL_TABLET | Freq: Every day | ORAL | Status: DC
  Administered 2018-02-26: 80 mg via ORAL
  Filled 2018-02-25: qty 4

## 2018-02-25 MED ORDER — PROPOFOL 10 MG/ML IV BOLUS
INTRAVENOUS | Status: AC
  Filled 2018-02-25: qty 40

## 2018-02-25 MED ORDER — LABETALOL HCL 5 MG/ML IV SOLN
2.5000 mg | INTRAVENOUS | Status: DC | PRN
  Administered 2018-02-25 (×2): 2.5 mg via INTRAVENOUS

## 2018-02-25 MED ORDER — ACETAMINOPHEN 325 MG PO TABS: 325.0000 mg | ORAL_TABLET | ORAL | Status: DC | PRN

## 2018-02-25 MED ORDER — PROPOFOL 10 MG/ML IV BOLUS
INTRAVENOUS | Status: DC | PRN
  Administered 2018-02-25: 150 mg via INTRAVENOUS

## 2018-02-25 MED ORDER — ONDANSETRON HCL 4 MG/2ML IJ SOLN
INTRAMUSCULAR | Status: AC
  Filled 2018-02-25: qty 2

## 2018-02-25 MED ORDER — METHOCARBAMOL 500 MG PO TABS
500.0000 mg | ORAL_TABLET | Freq: Four times a day (QID) | ORAL | Status: DC | PRN
  Filled 2018-02-25: qty 1

## 2018-02-25 MED ORDER — DOCUSATE SODIUM 100 MG PO CAPS
100.0000 mg | ORAL_CAPSULE | Freq: Two times a day (BID) | ORAL | Status: DC
  Administered 2018-02-25 – 2018-02-26 (×3): 100 mg via ORAL
  Filled 2018-02-25 (×3): qty 1

## 2018-02-25 MED ORDER — HYDRALAZINE HCL 20 MG/ML IJ SOLN: 10.0000 mg | Freq: Once | INTRAMUSCULAR | Status: DC

## 2018-02-25 MED ORDER — INSULIN ASPART 100 UNIT/ML ~~LOC~~ SOLN
SUBCUTANEOUS | Status: AC
  Administered 2018-02-25: 2 [IU] via SUBCUTANEOUS
  Filled 2018-02-25: qty 1

## 2018-02-25 MED ORDER — SUCCINYLCHOLINE CHLORIDE 20 MG/ML IJ SOLN
INTRAMUSCULAR | Status: DC | PRN
  Administered 2018-02-25 (×2): 120 mg via INTRAVENOUS

## 2018-02-25 MED ORDER — MIDAZOLAM HCL 2 MG/2ML IJ SOLN
INTRAMUSCULAR | Status: AC
  Filled 2018-02-25: qty 2

## 2018-02-25 MED ORDER — EPHEDRINE SULFATE 50 MG/ML IJ SOLN
INTRAMUSCULAR | Status: DC | PRN
  Administered 2018-02-25: 5 mg via INTRAVENOUS
  Administered 2018-02-25: 10 mg via INTRAVENOUS
  Administered 2018-02-25: 5 mg via INTRAVENOUS
  Administered 2018-02-25: 10 mg via INTRAVENOUS
  Administered 2018-02-25: 5 mg via INTRAVENOUS

## 2018-02-25 MED ORDER — INSULIN ASPART 100 UNIT/ML ~~LOC~~ SOLN
2.0000 [IU] | Freq: Once | SUBCUTANEOUS | Status: AC
  Administered 2018-02-25: 2 [IU] via SUBCUTANEOUS

## 2018-02-25 MED ORDER — MIDAZOLAM HCL 2 MG/2ML IJ SOLN
INTRAMUSCULAR | Status: AC
Start: 2018-02-25 — End: 2018-02-25
  Filled 2018-02-25: qty 2

## 2018-02-25 MED ORDER — PROPOFOL 500 MG/50ML IV EMUL
INTRAVENOUS | Status: AC
  Filled 2018-02-25: qty 50

## 2018-02-25 MED ORDER — LIDOCAINE HCL (CARDIAC) PF 100 MG/5ML IV SOSY
PREFILLED_SYRINGE | INTRAVENOUS | Status: DC | PRN
  Administered 2018-02-25: 100 mg via INTRAVENOUS

## 2018-02-25 MED ORDER — HYDROMORPHONE HCL 1 MG/ML IJ SOLN
INTRAMUSCULAR | Status: AC
  Administered 2018-02-25: 0.5 mg via INTRAVENOUS
  Filled 2018-02-25: qty 1

## 2018-02-25 MED ORDER — SODIUM CHLORIDE 0.9 % IV SOLN
INTRAVENOUS | Status: DC | PRN
  Administered 2018-02-25: 50 ug/min via INTRAVENOUS

## 2018-02-25 MED ORDER — REMIFENTANIL HCL 1 MG IV SOLR
INTRAVENOUS | Status: AC
  Filled 2018-02-25: qty 1000

## 2018-02-25 MED ORDER — MENTHOL 3 MG MT LOZG
1.0000 | LOZENGE | OROMUCOSAL | Status: DC | PRN
  Filled 2018-02-25: qty 9

## 2018-02-25 MED ORDER — HYDROCODONE-ACETAMINOPHEN 7.5-325 MG PO TABS
1.0000 | ORAL_TABLET | Freq: Once | ORAL | Status: AC | PRN
  Administered 2018-02-25: 1 via ORAL

## 2018-02-25 MED ORDER — HYDROCODONE-ACETAMINOPHEN 7.5-325 MG PO TABS
ORAL_TABLET | ORAL | Status: AC
  Filled 2018-02-25: qty 1

## 2018-02-25 MED ORDER — THROMBIN 5000 UNITS EX SOLR
CUTANEOUS | Status: AC
  Filled 2018-02-25: qty 5000

## 2018-02-25 MED ORDER — HYDROMORPHONE HCL 1 MG/ML IJ SOLN
0.2500 mg | INTRAMUSCULAR | Status: DC | PRN
  Administered 2018-02-25 (×4): 0.5 mg via INTRAVENOUS

## 2018-02-25 MED ORDER — REMIFENTANIL HCL 1 MG IV SOLR
INTRAVENOUS | Status: AC
  Filled 2018-02-25: qty 2000

## 2018-02-25 MED ORDER — HYDROMORPHONE HCL 1 MG/ML IJ SOLN
INTRAMUSCULAR | Status: AC
  Filled 2018-02-25: qty 1

## 2018-02-25 MED ORDER — BACITRACIN ZINC 500 UNIT/GM EX OINT
TOPICAL_OINTMENT | CUTANEOUS | Status: AC
  Filled 2018-02-25: qty 56.7

## 2018-02-25 MED ORDER — PHENYLEPHRINE HCL 10 MG/ML IJ SOLN
INTRAMUSCULAR | Status: AC
  Filled 2018-02-25: qty 1

## 2018-02-25 MED ORDER — MORPHINE SULFATE (PF) 2 MG/ML IV SOLN: 2.0000 mg | INTRAVENOUS | Status: DC | PRN

## 2018-02-25 MED ORDER — LISINOPRIL 5 MG PO TABS
5.0000 mg | ORAL_TABLET | Freq: Every day | ORAL | Status: DC
  Administered 2018-02-26: 5 mg via ORAL
  Filled 2018-02-25: qty 1

## 2018-02-25 MED ORDER — POLYETHYLENE GLYCOL 3350 17 G PO PACK: 17.0000 g | PACK | Freq: Every day | ORAL | Status: DC | PRN

## 2018-02-25 MED ORDER — METOPROLOL TARTRATE 50 MG PO TABS
50.0000 mg | ORAL_TABLET | Freq: Two times a day (BID) | ORAL | Status: DC
  Administered 2018-02-25 – 2018-02-26 (×3): 50 mg via ORAL
  Filled 2018-02-25 (×3): qty 1

## 2018-02-25 MED ORDER — PHENYLEPHRINE HCL 10 MG/ML IJ SOLN
INTRAMUSCULAR | Status: DC | PRN
  Administered 2018-02-25 (×8): 100 ug via INTRAVENOUS

## 2018-02-25 MED ORDER — ONDANSETRON HCL 4 MG/2ML IJ SOLN: 4.0000 mg | Freq: Four times a day (QID) | INTRAMUSCULAR | Status: DC | PRN

## 2018-02-25 MED ORDER — LABETALOL HCL 5 MG/ML IV SOLN
INTRAVENOUS | Status: AC
  Administered 2018-02-25: 2.5 mg via INTRAVENOUS
  Filled 2018-02-25: qty 4

## 2018-02-25 MED ORDER — LIDOCAINE HCL 1 % IJ SOLN
INTRAMUSCULAR | Status: DC | PRN
  Administered 2018-02-25: 4 mL

## 2018-02-25 MED ORDER — HYDRALAZINE HCL 20 MG/ML IJ SOLN
INTRAMUSCULAR | Status: AC
  Administered 2018-02-25: 5 mg via INTRAVENOUS
  Filled 2018-02-25: qty 1

## 2018-02-25 MED ORDER — POTASSIUM CHLORIDE IN NACL 20-0.9 MEQ/L-% IV SOLN
INTRAVENOUS | Status: DC
  Administered 2018-02-25: 20:00:00 via INTRAVENOUS
  Filled 2018-02-25 (×6): qty 1000

## 2018-02-25 MED ORDER — PHENOL 1.4 % MT LIQD
1.0000 | OROMUCOSAL | Status: DC | PRN
  Filled 2018-02-25: qty 177

## 2018-02-25 MED ORDER — SODIUM CHLORIDE 0.9% FLUSH
3.0000 mL | Freq: Two times a day (BID) | INTRAVENOUS | Status: DC
  Administered 2018-02-26: 3 mL via INTRAVENOUS

## 2018-02-25 MED ORDER — FENOFIBRATE 145 MG PO TABS
145.0000 mg | ORAL_TABLET | Freq: Every day | ORAL | Status: DC
  Administered 2018-02-26 – 2018-02-27 (×2): 145 mg via ORAL
  Filled 2018-02-25 (×5): qty 1

## 2018-02-25 MED ORDER — SUCCINYLCHOLINE CHLORIDE 20 MG/ML IJ SOLN
INTRAMUSCULAR | Status: AC
  Filled 2018-02-25: qty 1

## 2018-02-25 MED ORDER — PROMETHAZINE HCL 25 MG/ML IJ SOLN: 6.2500 mg | INTRAMUSCULAR | Status: DC | PRN

## 2018-02-25 MED ORDER — PANTOPRAZOLE SODIUM 40 MG PO TBEC
40.0000 mg | DELAYED_RELEASE_TABLET | Freq: Every day | ORAL | Status: DC
  Administered 2018-02-26: 40 mg via ORAL
  Filled 2018-02-25: qty 1

## 2018-02-25 MED ORDER — HYDROCODONE-ACETAMINOPHEN 5-325 MG PO TABS
1.0000 | ORAL_TABLET | ORAL | Status: DC | PRN
  Administered 2018-02-25 – 2018-02-26 (×2): 2 via ORAL
  Filled 2018-02-25 (×2): qty 2

## 2018-02-25 MED ORDER — SODIUM CHLORIDE 0.9% FLUSH
3.0000 mL | INTRAVENOUS | Status: DC | PRN
  Administered 2018-02-25: 3 mL via INTRAVENOUS
  Filled 2018-02-25: qty 3

## 2018-02-25 MED ORDER — LIDOCAINE HCL (PF) 1 % IJ SOLN
INTRAMUSCULAR | Status: AC
  Filled 2018-02-25: qty 30

## 2018-02-25 MED ORDER — PROPOFOL 10 MG/ML IV BOLUS
INTRAVENOUS | Status: AC
  Filled 2018-02-25: qty 60

## 2018-02-25 MED ORDER — ACETAMINOPHEN 160 MG/5ML PO SOLN
325.0000 mg | ORAL | Status: DC | PRN
  Filled 2018-02-25: qty 20.3

## 2018-02-25 MED ORDER — EPHEDRINE SULFATE 50 MG/ML IJ SOLN
INTRAMUSCULAR | Status: AC
  Filled 2018-02-25: qty 2

## 2018-02-25 MED ORDER — SENNA 8.6 MG PO TABS
1.0000 | ORAL_TABLET | Freq: Two times a day (BID) | ORAL | Status: DC
  Administered 2018-02-25: 8.6 mg via ORAL
  Filled 2018-02-25 (×2): qty 1

## 2018-02-25 MED ORDER — INSULIN ASPART 100 UNIT/ML ~~LOC~~ SOLN
SUBCUTANEOUS | Status: AC
Start: 2018-02-25 — End: 2018-02-25
  Administered 2018-02-25: 2 [IU] via SUBCUTANEOUS
  Filled 2018-02-25: qty 1

## 2018-02-25 MED ORDER — PROPOFOL 500 MG/50ML IV EMUL
INTRAVENOUS | Status: DC | PRN
  Administered 2018-02-25: 150 ug/kg/min via INTRAVENOUS

## 2018-02-25 MED ORDER — GABAPENTIN 300 MG PO CAPS
300.0000 mg | ORAL_CAPSULE | Freq: Every day | ORAL | Status: DC
  Administered 2018-02-25 – 2018-02-26 (×2): 300 mg via ORAL
  Filled 2018-02-25 (×2): qty 1

## 2018-02-25 MED ORDER — VANCOMYCIN HCL IN DEXTROSE 1-5 GM/200ML-% IV SOLN
1000.0000 mg | Freq: Once | INTRAVENOUS | Status: AC
  Administered 2018-02-25: 1000 mg via INTRAVENOUS

## 2018-02-25 MED ORDER — MIDAZOLAM HCL 2 MG/2ML IJ SOLN
INTRAMUSCULAR | Status: DC | PRN
  Administered 2018-02-25 (×2): 2 mg via INTRAVENOUS

## 2018-02-25 MED ORDER — FENTANYL CITRATE (PF) 100 MCG/2ML IJ SOLN
INTRAMUSCULAR | Status: DC | PRN
  Administered 2018-02-25 (×2): 50 ug via INTRAVENOUS

## 2018-02-25 MED ORDER — ACETAMINOPHEN 10 MG/ML IV SOLN
INTRAVENOUS | Status: DC | PRN
  Administered 2018-02-25: 1000 mg via INTRAVENOUS

## 2018-02-25 MED ORDER — PROPOFOL 500 MG/50ML IV EMUL
INTRAVENOUS | Status: AC
  Filled 2018-02-25: qty 100

## 2018-02-25 MED ORDER — LABETALOL HCL 5 MG/ML IV SOLN
2.5000 mg | INTRAVENOUS | Status: AC | PRN
  Administered 2018-02-25 (×2): 2.5 mg via INTRAVENOUS

## 2018-02-25 MED ORDER — LINAGLIPTIN 5 MG PO TABS
5.0000 mg | ORAL_TABLET | Freq: Every day | ORAL | Status: DC
  Administered 2018-02-26 – 2018-02-27 (×2): 5 mg via ORAL
  Filled 2018-02-25 (×3): qty 1

## 2018-02-25 MED ORDER — VANCOMYCIN HCL IN DEXTROSE 1-5 GM/200ML-% IV SOLN
INTRAVENOUS | Status: AC
  Filled 2018-02-25: qty 200

## 2018-02-25 SURGICAL SUPPLY — 65 items
BIT DRILL CENTERPIECE (BIT) ×1 IMPLANT
BLADE BOVIE TIP EXT 4 (BLADE) ×2 IMPLANT
BLADE SURG 15 STRL LF DISP TIS (BLADE) ×1 IMPLANT
BLADE SURG 15 STRL SS (BLADE) ×1
BUR NEURO DRILL SOFT 3.0X3.8M (BURR) ×2 IMPLANT
BUR SABER DIAMOND 3.0 (BURR) IMPLANT
CANISTER SUCT 1200ML W/VALVE (MISCELLANEOUS) ×4 IMPLANT
CHLORAPREP W/TINT 26ML (MISCELLANEOUS) ×2 IMPLANT
COUNTER NEEDLE 20/40 LG (NEEDLE) ×2 IMPLANT
COVER LIGHT HANDLE STERIS (MISCELLANEOUS) ×4 IMPLANT
CRADLE LAMINECT ARM (MISCELLANEOUS) ×2 IMPLANT
CUP MEDICINE 2OZ PLAST GRAD ST (MISCELLANEOUS) ×4 IMPLANT
DERMABOND ADVANCED (GAUZE/BANDAGES/DRESSINGS) ×1
DERMABOND ADVANCED .7 DNX12 (GAUZE/BANDAGES/DRESSINGS) ×1 IMPLANT
DRAPE C-ARM 42X72 X-RAY (DRAPES) ×2 IMPLANT
DRAPE INCISE IOBAN 66X45 STRL (DRAPES) ×2 IMPLANT
DRAPE MICROSCOPE SPINE 48X150 (DRAPES) ×2 IMPLANT
DRAPE POUCH INSTRU U-SHP 10X18 (DRAPES) ×2 IMPLANT
DRAPE SHEET LG 3/4 BI-LAMINATE (DRAPES) ×6 IMPLANT
DRAPE SURG 17X11 SM STRL (DRAPES) ×8 IMPLANT
DRAPE THYROID T SHEET (DRAPES) ×2 IMPLANT
DRILL ×2 IMPLANT
DRILL BIT CENTERPIECE (BIT) ×2
DRSG TEGADERM 2-3/8X2-3/4 SM (GAUZE/BANDAGES/DRESSINGS) ×2 IMPLANT
DRSG TELFA 4X3 1S NADH ST (GAUZE/BANDAGES/DRESSINGS) ×2 IMPLANT
ELECT CAUTERY BLADE TIP 2.5 (TIP) ×2
ELECT EZSTD 165MM 6.5IN (MISCELLANEOUS) ×2
ELECTRODE CAUTERY BLDE TIP 2.5 (TIP) ×1 IMPLANT
ELECTRODE EZSTD 165MM 6.5IN (MISCELLANEOUS) ×1 IMPLANT
FEE INTRAOP MONITOR IMPULS NCS (MISCELLANEOUS) ×1 IMPLANT
GAUZE PETRO XEROFOAM 1X8 (MISCELLANEOUS) ×2 IMPLANT
GOWN STRL REUS W/ TWL LRG LVL3 (GOWN DISPOSABLE) ×2 IMPLANT
GOWN STRL REUS W/ TWL XL LVL3 (GOWN DISPOSABLE) ×1 IMPLANT
GOWN STRL REUS W/TWL LRG LVL3 (GOWN DISPOSABLE) ×2
GOWN STRL REUS W/TWL XL LVL3 (GOWN DISPOSABLE) ×1
GRADUATE 1200CC STRL 31836 (MISCELLANEOUS) ×2 IMPLANT
HEMOSTAT SURGICEL 2X3 (HEMOSTASIS) ×2 IMPLANT
HEMOVAC 400CC 10FR (MISCELLANEOUS) ×2 IMPLANT
INTRAOP MONITOR FEE IMPULS NCS (MISCELLANEOUS) ×1
INTRAOP MONITOR FEE IMPULSE (MISCELLANEOUS) ×1
IV CATH ANGIO 12GX3 LT BLUE (NEEDLE) IMPLANT
MARKER SKIN DUAL TIP RULER LAB (MISCELLANEOUS) ×2 IMPLANT
NEEDLE HYPO 22GX1.5 SAFETY (NEEDLE) ×2 IMPLANT
NEEDLE SPNL 22GX3.5 QUINCKE BK (NEEDLE) ×2 IMPLANT
PACK LAMINECTOMY NEURO (CUSTOM PROCEDURE TRAY) ×2 IMPLANT
PIN CASPAR 14 (PIN) ×1 IMPLANT
PIN CASPAR 14MM (PIN) ×2
PIN MAYFIELD SKULL DISP (PIN) ×2 IMPLANT
PLATE CENTERPIECE 10 TITANIUM (Plate) ×10 IMPLANT
SOLUTION ELECTROLUBE (MISCELLANEOUS) IMPLANT
SPOGE SURGIFLO 8M (HEMOSTASIS) ×1
SPONGE KITTNER 5P (MISCELLANEOUS) ×2 IMPLANT
SPONGE SURGIFLO 8M (HEMOSTASIS) ×1 IMPLANT
SPONGE XRAY 4X4 16PLY STRL (MISCELLANEOUS) ×2 IMPLANT
STAPLER SKIN PROX 35W (STAPLE) ×2 IMPLANT
SUT BONE WAX W31G (SUTURE) ×2 IMPLANT
SUT VIC AB 0 CT1 18XCR BRD 8 (SUTURE) ×2 IMPLANT
SUT VIC AB 0 CT1 8-18 (SUTURE) ×2
SUT VIC AB 2-0 CT1 18 (SUTURE) ×4 IMPLANT
SUT VIC AB 3-0 SH 8-18 (SUTURE) ×2 IMPLANT
SYR 30ML LL (SYRINGE) ×2 IMPLANT
TAPE ADH 3 LX (MISCELLANEOUS) ×6 IMPLANT
TOWEL OR 17X26 4PK STRL BLUE (TOWEL DISPOSABLE) ×4 IMPLANT
TRAY FOLEY W/METER SILVER 16FR (SET/KITS/TRAYS/PACK) ×2 IMPLANT
TUBING CONNECTING 10 (TUBING) ×2 IMPLANT

## 2018-02-25 NOTE — Transfer of Care (Signed)
Immediate Anesthesia Transfer of Care Note  Patient: [name]  Procedure(s) Performed: POSTERIOR CERVICAL LAMINECTOMY-CERVICAL 3 (N/A Spine Cervical)  Patient Location: PACU  Anesthesia Type:General  Level of Consciousness: sedated  Airway & Oxygen Therapy: Patient connected to face mask oxygen  Post-op Assessment: Post -op Vital signs reviewed and stable  Post vital signs: stable  Last Vitals:  Vitals Value Taken Time  BP 150/92 02/25/2018  1:59 PM  Temp    Pulse 92 02/25/2018  1:59 PM  Resp 22 02/25/2018  1:59 PM  SpO2 100 % 02/25/2018  1:59 PM  Vitals shown include unvalidated device data.  Last Pain:  Vitals:   02/25/18 0612  TempSrc: Temporal  PainSc: 0-No pain         Complications: No apparent anesthesia complications

## 2018-02-25 NOTE — Anesthesia Preprocedure Evaluation (Addendum)
Anesthesia Evaluation  Patient identified by MRN, date of birth, ID band Patient awake    Reviewed: Allergy & Precautions, H&P , NPO status , reviewed documented beta blocker date and time   Airway Mallampati: I  TM Distance: >3 FB Neck ROM: full    Dental  (+) Teeth Intact   Pulmonary Current Smoker,    Pulmonary exam normal        Cardiovascular hypertension, + CAD and + Cardiac Stents  Normal cardiovascular exam     Neuro/Psych PSYCHIATRIC DISORDERS Depression    GI/Hepatic GERD  Controlled,  Endo/Other  diabetes  Renal/GU      Musculoskeletal  (+) Arthritis ,   Abdominal   Peds  Hematology   Anesthesia Other Findings Off plavix 4 days - Discussed w surgeon Given 2 un insulin for FS 180  Essential (primary) hypertension  HLD (hyperlipidemia)  Cannot sleep  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  Arteriosclerosis of coronary artery  S/P coronary artery stent placement  Drug eluting stent 2009             CAD in native artery  Gastro-esophageal reflux disease without esophagitis  Controlled type 2 diabetes mellitus without complication Spondylosis of lumbar region without myelopathy or radiculopathy    Reproductive/Obstetrics                            Anesthesia Physical Anesthesia Plan  ASA: III  Anesthesia Plan: General ETT   Post-op Pain Management:    Induction: Intravenous  PONV Risk Score and Plan: 2 and Ondansetron, Midazolam and Propofol infusion  Airway Management Planned: Oral ETT  Additional Equipment: Arterial line  Intra-op Plan:   Post-operative Plan: Extubation in OR  Informed Consent: I have reviewed the patients History and Physical, chart, labs and discussed the procedure including the risks, benefits and alternatives for the proposed anesthesia with the patient or authorized representative who has indicated his/her understanding and acceptance.   Dental Advisory  Given  Plan Discussed with: CRNA and Surgeon  Anesthesia Plan Comments: (Pre-induction A-line, Video Laryngoscopy, maintain baseline BP and neck neutrality)       Anesthesia Quick Evaluation

## 2018-02-25 NOTE — Anesthesia Procedure Notes (Signed)
Arterial Line Insertion Start/End5/03/2018 7:41 AM, 02/25/2018 7:45 AM Performed by: Christia Reading, MD, anesthesiologist  Patient location: OR. Preanesthetic checklist: patient identified, IV checked, site marked, risks and benefits discussed, surgical consent, monitors and equipment checked and pre-op evaluation Lidocaine 1% used for infiltration Left, radial was placed Catheter size: 20 G Allen's test indicative of satisfactory collateral circulation Attempts: 1 Procedure performed without using ultrasound guided technique. Following insertion, Biopatch and dressing applied. Post procedure assessment: normal  Patient tolerated the procedure well with no immediate complications.

## 2018-02-25 NOTE — Consult Note (Signed)
Reason for Consult: To assist with critical care management Referring Physician: Dr. Delora Fuel [name] is an 67 y.o. male.  HPI: [name] is a very pleasant 61 year old gentleman with a past medical history remarkable for hypertension, hyperlipidemia, gastroesophageal reflux disease, coronary artery disease, diabetes, degenerative arthritis with cervical spondylitic myelopathy, status post decompressive posterior cervical laminectomy to be monitored in the intensive care unit postoperatively.  Patient states he is doing well at this time.  Presently extubated, doing well.  Pending ICU transfer  Past Medical History:  Diagnosis Date  . Arthritis    osteoarthriis of back  . Coronary artery disease   . Depression   . Diabetes mellitus without complication (HCC)   . GERD (gastroesophageal reflux disease)   . Hyperlipidemia   . Hypertension     Past Surgical History:  Procedure Laterality Date  . CATARACT EXTRACTION W/PHACO Right 03/01/2017   Procedure: CATARACT EXTRACTION PHACO AND INTRAOCULAR LENS PLACEMENT (IOC);  Surgeon: Nevada Crane, MD;  Location: [facility] ORS;  Service: Ophthalmology;  Laterality: Right;  [place] 22.6 AP% 6.1 CDE 1.37 Fluid pack lot # [id number]  . CATARACT EXTRACTION W/PHACO Left 05/01/2017   Procedure: CATARACT EXTRACTION PHACO AND INTRAOCULAR LENS PLACEMENT (IOC)  Left Diabetic;  Surgeon: Nevada Crane, MD;  Location[facility];  Service: Ophthalmology;  Laterality: Left;  Block Diabetic  . CORONARY ANGIOPLASTY     STENT 2009  . Stint  2007    Family History  Problem Relation Age of Onset  . Diabetes Mother        mellitus type2    Social History:  reports that he has been smoking cigars.  He has never used smokeless tobacco. He reports that he does not drink alcohol or use drugs.  Allergies:  Allergies  Allergen Reactions  . Amoxicillin     diarrhea and headache Has patient had a PCN reaction causing immediate rash,  facial/tongue/throat swelling, SOB or lightheadedness with hypotension: No Has patient had a PCN reaction causing severe rash involving mucus membranes or skin necrosis: No Has patient had a PCN reaction that required hospitalization: No Has patient had a PCN reaction occurring within the last 10 years: Unknown If all of the above answers are "NO", then may proceed with Cephalosporin use.   Marland Kitchen Fexofenadine     Unknown reaction     Medications: I have reviewed the patient's current medications.  Results for orders placed or performed during the hospital encounter of 02/25/18 (from the past 48 hour(s))  Glucose, capillary     Status: Abnormal   Collection Time: 02/25/18  6:18 AM  Result Value Ref Range   Glucose-Capillary 180 (H) 65 - 99 mg/dL  ABO/Rh     Status: None   Collection Time: 02/25/18  6:22 AM  Result Value Ref Range   ABO/RH(D)      O NEG Performed at [facility], [address] [place], [place] [address]   I-STAT 4, (NA,K, GLUC, HGB,HCT)     Status: Abnormal   Collection Time: 02/25/18  6:27 AM  Result Value Ref Range   Sodium 143 135 - 145 mmol/L   Potassium 3.6 3.5 - 5.1 mmol/L   Glucose, Bld 184 (H) 65 - 99 mg/dL   HCT 81.1 91.4 - 78.2 %   Hemoglobin 13.9 13.0 - 17.0 g/dL  Glucose, capillary     Status: Abnormal   Collection Time: 02/25/18  7:27 AM  Result Value Ref Range   Glucose-Capillary 190 (  H) 65 - 99 mg/dL  Glucose, capillary     Status: Abnormal   Collection Time: 02/25/18  1:58 PM  Result Value Ref Range   Glucose-Capillary 213 (H) 65 - 99 mg/dL    Dg Cervical Spine 2-3 Views  Result Date: 02/25/2018 CLINICAL DATA:  Intraoperative fluoro spot view localizing the surgical site. EXAM: CERVICAL SPINE - 2-3 VIEW COMPARISON:  MRI of the cervical spine of February 13, 2018 FINDINGS: A metallic trocar is present and overlies the posterior inferior aspect of the spinous process of T4. Tissue spreader devices are present. IMPRESSION: The metallic  clamp overlies the posterior tip of the C4 spinous process. Electronically Signed   By: David  [place] M.D.   On: 02/25/2018 13:58   Dg C-arm 1-60 Min-no Report  Result Date: 02/25/2018 Fluoroscopy was utilized by the requesting physician.  No radiographic interpretation.   Dg C-arm 1-60 Min-no Report  Result Date: 02/25/2018 Fluoroscopy was utilized by the requesting physician.  No radiographic interpretation.    ROS Blood pressure 140/69, pulse 87, temperature 97.8 F (36.6 C), resp. rate 15, SpO2 97 %. Physical Exam  Patient is awake, alert, no acute distress HEENT: Status post posterior cervical laminectomy.  Trachea is midline, no accessory muscle utilization, no oral lesions appreciated, no distended neck veins Cardiovascular: Tacky regular rate and rhythm Pulmonary: Clear to auscultation Abdominal: Positive bowel sounds, soft exam, Extremities: No clubbing cyanosis or edema noted Neurologic: No clear focal deficits patient moves all extremities. Cutaneous: No rashes or lesions noted  Assessment/Plan:  Status post posterior laminectomy.  Patient doing well at this time.  Per neurosurgery would like mean arterial pressures 95 or greater.  Presently patient is hypertensive with a mean arterial pressure of 119.  Will monitor in intensive care unit overnight.  When arrives we will obtain baseline laboratory studies.  Mechanical DVT prophylaxis, peptic ulcer disease prophylaxis  [name] 02/25/2018, 3:15 PM

## 2018-02-25 NOTE — Anesthesia Procedure Notes (Signed)
Procedure Name: Intubation Date/Time: 02/25/2018 7:53 AM Performed by: Irving Burton, CRNA Pre-anesthesia Checklist: Patient identified, Emergency Drugs available, Suction available and Patient being monitored Patient Re-evaluated:Patient Re-evaluated prior to induction Oxygen Delivery Method: Circle system utilized Preoxygenation: Pre-oxygenation with 100% oxygen Induction Type: IV induction Ventilation: Mask ventilation without difficulty Laryngoscope Size: McGraph and 4 Grade View: Grade I Tube type: Oral Tube size: 7.5 mm Number of attempts: 1 Airway Equipment and Method: Stylet and Video-laryngoscopy Placement Confirmation: ETT inserted through vocal cords under direct vision,  positive ETCO2 and breath sounds checked- equal and bilateral Secured at: 20 cm Tube secured with: Tape Dental Injury: Teeth and Oropharynx as per pre-operative assessment

## 2018-02-25 NOTE — OR Nursing (Signed)
Anesthesia and surgeon aware of last dose of Plavix and aspirin.  Dr Riley Nearing does not want preop antibiotic started in preop this am.  Anesthesia aware to be started in OR

## 2018-02-25 NOTE — Progress Notes (Signed)
Pharmacy Antibiotic Note  [name] is a 68 y.o. male admitted on 02/25/2018 for cervical laminectomy.  Pharmacy has been consulted for vancomycin dosing while drain is in place.   Plan: Patient received vancomycin 1000 mg this AM. Will order vancomycin 1500 mg iv q 12 hours and consider checking levels depending on planned duration of therapy.   Height: 6' (182.9 cm) Weight: 221 lb 1.9 oz (100.3 kg) IBW/kg (Calculated) : 77.6  Temp (24hrs), Avg:97.7 F (36.5 C), Min:96.6 F (35.9 C), Max:98.1 F (36.7 C)  Recent Labs  Lab 02/21/18 1444  WBC 8.2  CREATININE 0.83    Estimated Creatinine Clearance: 105.9 mL/min (by C-G formula based on SCr of 0.83 mg/dL).    Allergies  Allergen Reactions  . Amoxicillin     diarrhea and headache Has patient had a PCN reaction causing immediate rash, facial/tongue/throat swelling, SOB or lightheadedness with hypotension: No Has patient had a PCN reaction causing severe rash involving mucus membranes or skin necrosis: No Has patient had a PCN reaction that required hospitalization: No Has patient had a PCN reaction occurring within the last 10 years: Unknown If all of the above answers are "NO", then may proceed with Cephalosporin use.   Marland Kitchen Fexofenadine     Unknown reaction     Antimicrobials this admission: Vancomycin 5/6 >>   Dose adjustments this admission:  Thank you for allowing pharmacy to be a part of this patient's care.  Luisa Hart D 02/25/2018 6:59 PM

## 2018-02-25 NOTE — Op Note (Signed)
TRAYSON STITELY Jul 22, 1951 [facility] [id number]  Neurosurgery Operative Note  Date of Procedure: Feb 25, 2018  Location: Main Operating Room 3  Staff: Jeannette Maddy A. Bradley Ferris, M.D.  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  Imaging demonstrates C3 cervical stenosis with cord compression and T2 signal change.  The nature, risks, and alternatives of the various management options have been extensively reviewed with the patient.  The patient wishes to proceed with surgical decompression via laminoplasty.  The patient understands the risks include but are not limited to pain, bleeding, infection, coma, stroke, death, paralysis, weakness, numbness, persistence of symptoms, recurrence of symptoms, cerebrospinal fluid leak, need for additional surgery, failure of hardware and/or its fixation.  All questions answered in lay terms and detail; the patient wishes to proceed; consent signed and on chart.  Description of Procedure: The patient was brought to the operating room and kept supine on the transport gurney.  A radial arterial line was placed.  Goal mean arterial pressures were maintained at throughout the procedure.  General oroendotracheal anesthesia was induced by the anesthesia service.  Neuorophysiologic leads were placed for MEP and SSEP monitoring.  Baseline readings were obtained.  The mayfield 3-pin headrest was affixed to the patient's skull.  The patient was positioned prone on gel laminectomy rolls on the operating room table.  All pressure points were padded and the  patient secured to the OR table with safety straps and tape.  Post-positioning neurophysiologic tracings were obtained.  A reduction in MEP's in the BLE was noted.  The patient's neck positioning was adjusted and the propofol reduced.  No improvement was noted.  The patient was returned to the gurney in supine position.  The anesthetic was reduced.  Return of the BLE MEP responses was noted.  Anesthetic agents were titrated to achieve a BIS score of less than 60.  At this level, the BLE MEP's were noted to be gone while the BUE MEP responses remained strong.  It was felt this indicated that the lower extremity MEP responses were quite sensitive to the required anesthetic agents needed to safely conduct the procedure.  As the hand responses remained robust and were also distal to the compressive lesion, it was decided to rely on the hand responses for the procedure.    The Mayfield 3-pin headrest was reaffixed to the patient's skull.  The patient was positioned prone on gel laminectomy rolls on the operating room table.  All pressure points were padded and the patient secured to the OR table with safety straps and tape.  Post-positioning neurophysiologic tracings were obtained and no change noted.  As we were preparing to place the bair hugger, the ET was noted to dislodge from the patient's mouth.  He was immediately returned to a supine position on the gurney, the Mayfield removed, and reintubated without difficulty.  SSEP and MEP tracings were unchanged.  The Mayfield was affixed to the patient's skull and the patient was positioned prone on gel laminectomy rolls on the operating room table.  All pressure points were padded and the patient secured to the OR table with safety straps and tape.  Post-positioning neurophysiologic tracings  were obtained and no change noted.  No change in ET-tube positioning was noted.  The C3 spinous process was marked with fluoroscopy.  The patient was prepped and draped in usual  sterile fashion.  A timeout was performed, identifying the correct patient, the correct procedure, and the correct location.  The incision was infiltrated with local anesthetic and opened sharply; a small self-retaining retractor was placed.  The cervical fascia was identified and entered with monopolar cautery.  The paraspinal musculature was elevated in subperiosteal fashion with a periosteal elevator and monopolar cautery.  Fluoroscopy was used to confirm the level of exposure to be C3.  A shadow-line retractor was placed and the microscope brought into the field.  The laminofacet junctions at C3 were identified bilaterally.  The rostral and caudal edges of the C3 lamina were defined bilaterally.  In order to visualize the rostral border of C3 on the right, the caudal edge of the right C2 spinous process was removed with a Leksell rongeur.  The bone was waxed.  The high-speed drill was used to create a partial thickness cut on the left and a full thickness cut on the right at the laminofacet junctions.  The ligamentum flavum was resected on the right using 1mm and 2mm Kerrison rongeurs.  The C3 lamina was displaced to the left.  The right C3 facet was contoured with the high-speed drill.  A small laminar plate was placed into position on the right at C3.  Pilot holes for 5 mm screws were drilled with the TPS drill in the lamina and facet.  The plate was secured in position with two 5 mm screws on the lamina and two 5 mm screws on the facet.  The surgical bed was copiously irrigated.  FloSeal was placed.  The surgical bed was again copiously irrigated.  The dura was lined with Surgicel.  The paraspinal musculature was coagulated with bipolar cautery.  A medium hemovac drain was placed in the subfascial space.  Liquid thrombin was placed in the surgical bed.  The scope was removed from the field.  The retractor was removed and the paraspinal musculature again coagulated with bipolar cautery.  No bleeding was  noted.  The paraspinal musculature was gently reapproximated with 0-vicryl suture.  The fascia was reapproximated with 0-vicryl suture.  The deep subcutaneous tissue was reapproximated with interrupted 2-0 Vicryl suture.  The dermis was reapproximated with inverted interrupted 3-0 vicryl suture.  The skin was closed with staples.  A sterile dressing was applied.  The patient was returned to a supine position on the hospital gurney, the Mayfield removed, the pateint extubated, and taken to PACU in good condition.  All sponge and needle counts were correct at the end of the procedure.  No changes in MEP's or SSEP's were noted.  Dr. Riley Nearing was present for the entire procedure.  FLUIDS: 1800cc crystalloid  EBL: 30cc

## 2018-02-25 NOTE — Interval H&P Note (Signed)
History and Physical Interval Note:  02/25/2018 7:09 AM  [name]  has presented today for surgery, with the diagnosis of cervical spondylotic myelopathy  The various methods of treatment have been discussed with the patient and family. After consideration of risks, benefits and other options for treatment, the patient has consented to  Procedure(s): POSTERIOR CERVICAL LAMINECTOMY-CERVICAL 3 (N/A), extension as medical necessary as a surgical intervention .  The patient's history has been reviewed, patient examined, no change in status, stable for surgery.  I have reviewed the patient's chart and labs.  Questions were answered to the patient's satisfaction.     Zyann Mabry A Kampbell Holaway

## 2018-02-25 NOTE — Anesthesia Post-op Follow-up Note (Signed)
Anesthesia QCDR form completed.        

## 2018-02-25 NOTE — Brief Op Note (Signed)
02/25/2018  1:39 PM  PATIENT:  [name]  67 y.o. male  PRE-OPERATIVE DIAGNOSIS:  cervical spondylotic myelopathy  POST-OPERATIVE DIAGNOSIS:  cervical spondylotic myelopathy  PROCEDURE:  Procedure(s): POSTERIOR CERVICAL LAMINECTOMY-CERVICAL 3 (N/A)  SURGEON:  Surgeon(s) and Role:    * Kino Dunsworth A, MD - Primary  PHYSICIAN ASSISTANT: Anabel Halon, PA-C  ASSISTANTS: none   ANESTHESIA:   general  EBL:  30 mL   BLOOD ADMINISTERED:none  DRAINS: (1) Hemovact drain(s) in the posterior neck with  Suction Open   LOCAL MEDICATIONS USED:  LIDOCAINE   SPECIMEN:  No Specimen  DISPOSITION OF SPECIMEN:  N/A  COUNTS:  YES  TOURNIQUET:  * No tourniquets in log *  DICTATION: .Dragon Dictation  PLAN OF CARE: Admit to inpatient   PATIENT DISPOSITION:  PACU - hemodynamically stable.   Delay start of Pharmacological VTE agent (>24hrs) due to surgical blood loss or risk of bleeding: yes

## 2018-02-25 NOTE — Progress Notes (Signed)
Neurosurgery Progress Note - [name] [id number] 02/25/2018  ACTIVE PROBLEMS: Active Problems:   Cervical myelopathy (HCC)   ASSESSMENT/PLAN POD#0 s/p C3 laminoplasty  1. MAP goal >46mmHg overnight 2. HOB as tol 3. ADAT 4. Continue drain; vancomycin q12h while drain in place 5. Hold chemical DVT proph; continue scd's/compression stockings 6. Appreciate critical care assistance   DIAGNOSIS: Cervical spondylosis with myelopathy   This note was dictated using voice recognition software.  Please contact me if there are any questions regarding its content.  Electronically signed by: Erskine Emery, III, MD 02/25/2018 4:16 PM   ______________________________________________________________________ SUBJECTIVE  Interval History: recovering in PACU; pain well controlled   Scheduled Meds: . sodium chloride flush         Continuous Infusions: . sodium chloride 50 mL/hr at 02/25/18 0621     PRN Meds:acetaminophen **OR** acetaminophen (TYLENOL) oral liquid 160 mg/5 mL, hydrALAZINE, HYDROcodone-acetaminophen, HYDROmorphone (DILAUDID) injection, labetalol, meperidine (DEMEROL) injection, promethazine    OBJECTIVE  Vitals Current Average / Min / Max     Temp    98.1 F (36.7 C)    Temp  Min: 96.6 F (35.9 C)  Max: 98.1 F (36.7 C)     BP     (!) 173/97     BP  Min: 134/108  Max: 185/100     HR    92    Pulse  Avg: 87.2  Min: 80  Max: 93     RR    15    Resp  Avg: 18.2  Min: 15  Max: 21     Sats    96 %    SpO2  Min: 95 %  Max: 100 %     Weight         Admit:     Intake/Output last 3 shifts: No intake/output data recorded.   Examination: AAOX3 Follows commands in all extremities Bilateral grip 5/5 No drift Plantarflexion/dorsiflexion 5/5 Drain intact  Lab Results  Component Value Date   NA 143 02/25/2018   K 3.6 02/25/2018   CL 103 02/21/2018   WBC 8.2 02/21/2018   HGB 13.9 02/25/2018   HCT 41.0 02/25/2018   INR 1.00 02/21/2018

## 2018-02-25 NOTE — H&P (Signed)
Dorthula Nettles, MD - 02/19/2018 9:15 AM EDT Formatting of this note might be different from the original.   Referring Physician:  Jolene Provost, MD [phone] [facility] [address] [facility] [place], [place] [address] Primary Physician:  Malva Limes, MD  DATE: 02/19/2018   RE: [name] Date of Birth: 09/08/1951 Medical record number: [id number]  Patient Care Team: Malva Limes, MD as PCP - General (Family Medicine)  REQUESTED BY: Jolene Provost, Md [address] [facility] [place], [place] [address]  REASON FOR VISIT:  [name] had concerns including Establish Care (weak grip, burning feet, burning from elbows to hands bilaterally. Has issues w/ balance, walking. Also complains of low back pain, burning from knees to toes bilaterally. Has not tried PT or injections.).  HISTORY OF PRESENT ILLNESS:  [name] is a 67 y.o. male, legally blind, with a history of diabetes, coronary artery disease status post cardiac stent, active smoker, referred for neurosurgical evaluation of ongoing difficulty with gait with abnormal MRI of the cervical spine. The patient reports his symptoms began in February 2019. Currently his primary complaint centers on difficulty with ambulation. He feels unsteady on his feet. He has suffered intermittent falls. He also reports weakness of his hands, difficulty opening jars, difficulty opening doors. He has also noted the onset of severe burning radiating from the knees to the bilateral feet as well as from the elbows to the bilateral hands. He currently uses a cane for ambulation. He is currently on aspirin and Plavix for previous placement of a carotid as well as cardiac stent.  PAST MEDICAL HISTORY:  Past Medical History:  Diagnosis Date  . Arthritis  . CAD (coronary artery disease)  . Glaucoma (increased eye pressure)  . Hyperlipidemia, unspecified  . Hypertension   PAST SURGICAL  HISTORY:  Past Surgical History:  Procedure Laterality Date  . CARDIAC CATHETERIZATION 04/08/2008  Xience V everolimus drug eluting stent to the 75% stenosis in the mid RCA  . CAROTID STENT  . cataract surgery Right   CURRENT MEDICATIONS:   Current Outpatient Medications:  . aspirin-calcium carbonate 81 mg-300 mg calcium(777 mg) Tab, Take by mouth, Disp: , Rfl:  . atorvastatin (LIPITOR) 80 MG tablet, , Disp: , Rfl:  . blood glucose diagnostic (GLUCOSE BLOOD) test strip, Check blood glucose daily, Disp: , Rfl:  . calcium 500 mg Tab, Take 1 tablet by mouth once daily., Disp: , Rfl:  . calcium citrate-vitamin D3 (CITRACAL+D PETITES) 200 mg calcium -250 unit tablet, Take by mouth, Disp: , Rfl:  . clopidogrel (PLAVIX) 75 mg tablet, Take 75 mg by mouth once daily., Disp: , Rfl:  . ergocalciferol, vitamin D2, 50,000 unit capsule, Take 1 capsule (50,000 Units total) by mouth once a week, Disp: 8 capsule, Rfl: 0 . fenofibrate nanocrystallized (TRICOR) 145 MG tablet, Take 145 mg by mouth once daily., Disp: , Rfl:  . gabapentin (NEURONTIN) 300 MG capsule, Take 1 capsule (300 mg total) by mouth 3 (three) times daily, Disp: 270 capsule, Rfl: 3 . hydrochlorothiazide (HYDRODIURIL) 25 MG tablet, Take 25 mg by mouth every morning., Disp: , Rfl:  . HYDROcodone-acetaminophen (NORCO) 5-325 mg tablet, , Disp: , Rfl:  . lisinopril (PRINIVIL,ZESTRIL) 5 MG tablet, Take 1 tablet (5 mg total) by mouth once daily., Disp: 90 tablet, Rfl: 3 . metFORMIN (GLUCOPHAGE) 1000 MG tablet, Take 1,000 mg by mouth 2 (two) times daily with meals. , Disp: , Rfl:  . metoprolol tartrate (LOPRESSOR) 50 MG tablet, Take  50 mg by mouth 2 (two) times daily., Disp: , Rfl:  . miscellaneous medical supply Misc, Dispense based on patient and insurance preference. Check sugars once daily, Disp: , Rfl:  . ONETOUCH DELICA LANCETS 33 gauge Misc, , Disp: , Rfl:  . pantoprazole (PROTONIX) 40 MG DR tablet, Take 40 mg by mouth once daily., Disp: ,  Rfl:  . potassium 99 mg Tab, Take 1 tablet by mouth once daily., Disp: , Rfl:  . potassium gluconate 2.5 mEq Tab, Take by mouth, Disp: , Rfl:  . sitaGLIPtin (JANUVIA) 100 MG tablet, Take by mouth., Disp: , Rfl:   ALLERGIES:  Allergies  Allergen Reactions  . Amoxicillin Other (See Comments)  diarrhea and headache  . Codeine Unknown  . Fexofenadine Other (See Comments)   SOCIAL HISTORY: Social History   Socioeconomic History  . Marital status: Single  Spouse name: Not on file  . Number of children: Not on file  . Years of education: Not on file  . Highest education level: Not on file  Occupational History  . Not on file  Social Needs  . Financial resource strain: Not on file  . Food insecurity:  Worry: Not on file  Inability: Not on file  . Transportation needs:  Medical: Not on file  Non-medical: Not on file  Tobacco Use  . Smoking status: Current Every Day Smoker  Types: Cigars  . Smokeless tobacco: Never Used  . Tobacco comment: 2 cigars/day  Substance and Sexual Activity  . Alcohol use: Not Currently  Alcohol/week: 0.0 oz  . Drug use: Never  . Sexual activity: Not Currently  Other Topics Concern  . Not on file  Social History Narrative  Education: na  Occupation: disabled  Hobbies: mowing, yard Therapist, music  Marital Status: divorced   He reports that he has been smoking cigars. He has never used smokeless tobacco. He reports that he drank alcohol. He reports that he does not use drugs. Single [1]  Social History   Social History Narrative  Education: na  Occupation: disabled  Hobbies: mowing, yard Therapist, music  Marital Status: divorced   FAMILY MEDICAL HISTORY:  his family history includes Deep vein thrombosis (DVT or abnormal blood clot formation) in his sister; Diabetes in his mother; High blood pressure (Hypertension) in his brother; Myocardial Infarction (Heart attack) in his father.  REVIEW OF SYSTEMS:  General Cardiovascular Neurological  Recent  fever/chills  Calf pain with walking  Confusion   Recent weight loss  Chest pain  Memory loss   Skin Leg swelling  Dizziness   Bruising  Irregular Heartbeat  Fainting   Rash  Gastrointestinal Headaches   Eyes/ Ears / Nose / Mouth / Throat Abdominal pain  Balance problems   Mouth sores  Blood in stools  Numbness/tingling   Change/blurring vision  Diarrhea  Seizures   Double vision  Nausea  Psychiatric  Ear infections  Vomiting  Anxiety   Hearing loss  Genitourinary Depression   Ringing in ears  Painful urination  Endocrine  Vertigo  Blood in urine  Excessive thirst   Nasal Congestion  Incontinence  Cold/heat intolerance   Recent sore throat  Musculoskeletal Low blood sugar   Respiratory Joint pain or swelling  Hormone replacement   Cough  Muscle pain  Steroid use   Shortness of breath  Hematologic  Excessive bleeding    PHYSICAL EXAM: Ht 172.7 cm ( )  Wt 90.7 kg (200 lb)  BMI 30.41 kg/m  There were no vitals filed for this  visit. GENERAL: Well-developed well-nourished, in no apparent distress.  MENTAL STATUS: AAOx3 SPEECH Normal fluency; naming, comprehension, repetition intact HEENT: sclera normal; oropharynx without erythema, lesions, exudates CV: RRR RESP: CTAB DERM: no skin changes or rashes.  MS: no joint swelling, erythema or tenderness.   MOTOR: 5/5 except 4+/5 right hand grip  SENSATION:  Hypesthesia: None Hypalgesia: None Rhomberg sign does not  DTRs:  2+ and symmetric throughout 2+ in the upper extremities; 3+ lower extremities Clonus: None Babinski: Down Hoffman's sign: Present on the left  GAIT:  Casual gait: Wide-based; unsteady; uses cane Tandem gait: Impaired  MEDICAL DECISION MAKING  IMAGING: I personally reviewed the patient's cervical spine MRI the patient. This shows canal stenosis at C3-4 with cord signal change. There is mild to moderate left  C5-6 foraminal narrowing. Please see the radiology report for additional details.  Cervical radiographs with flexion-extension: No evidence of abnormal motion to my review; there are severe facet degenerative changes throughout  ASSESSMENT/PLAN: Majority of time spent counseling patient on complex neurologic system disease. This included discussion of how diagnosis is made, review of pertinent imaging studies and examination findings, as well as scientific basis for the differential diagnosis. In addition, the patient was counseled about medical and surgical options for treatment. Included in this discussion were the indications for treatment, alternative treatments available, likelihood of success, risks associated with all treatment options, and long-term prognosis. All questions were answered, and the patient expressed understanding of the prescribed plan. No barriers to comprehension. The patient's symptoms and imaging findings are consistent with cervical spondylotic myelopathy stemming from his spinal cord compression at C3. We have discussed C3 laminoplasty to decompress his spinal cord. In order to proceed with this procedure, the patient will need to be off his aspirin and Plavix for 1 week before surgery and 2 weeks following surgery. We will contact his cardiologist and attempt to expedite his evaluation by them as well as risk stratification for general esthesia. We are tentatively planning surgery for May 6. If the patient is not able to undergo cardiac risk stratification before this time, we will proceed with the procedure on May 20.  Patient understands the risks of the procedure include but are not limited to pain, bleeding, infection, coma, stroke, death, paralysis, weakness, numbness, ventilator dependence, blindness, failure of hardware and/or its fixation, persistence of symptoms, recurrence of symptoms, cerebrospinal fluid leak, possible need for additional surgical procedures in the  short and/or long-term. I have answered his questions as well as his daughter's questions in lay terms in detail. They wish to proceed. Consent signed and on chart.  DIAGNOSIS: Cervical spondylosis with myelopathy (primary encounter diagnosis) Plan: X-ray cervical spine 4 to 5 views    This note was dictated using voice recognition software. Please contact me if there are any questions regarding its content.

## 2018-02-26 DIAGNOSIS — I160 Hypertensive urgency: Secondary | ICD-10-CM

## 2018-02-26 LAB — CBC WITH DIFFERENTIAL/PLATELET
Basophils Absolute: 0.1 10*3/uL (ref 0–0.1)
Basophils Relative: 1 %
Eosinophils Absolute: 0.2 10*3/uL (ref 0–0.7)
Eosinophils Relative: 3 %
HCT: 33.7 % — ABNORMAL LOW (ref 40.0–52.0)
HEMOGLOBIN: 11.5 g/dL — AB (ref 13.0–18.0)
LYMPHS ABS: 1.7 10*3/uL (ref 1.0–3.6)
LYMPHS PCT: 20 %
MCH: 30.3 pg (ref 26.0–34.0)
MCHC: 34.2 g/dL (ref 32.0–36.0)
MCV: 88.6 fL (ref 80.0–100.0)
MONOS PCT: 7 %
Monocytes Absolute: 0.6 10*3/uL (ref 0.2–1.0)
Neutro Abs: 5.8 10*3/uL (ref 1.4–6.5)
Neutrophils Relative %: 69 %
Platelets: 206 10*3/uL (ref 150–440)
RBC: 3.81 MIL/uL — ABNORMAL LOW (ref 4.40–5.90)
RDW: 15 % — ABNORMAL HIGH (ref 11.5–14.5)
WBC: 8.4 10*3/uL (ref 3.8–10.6)

## 2018-02-26 LAB — BASIC METABOLIC PANEL
Anion gap: 4 — ABNORMAL LOW (ref 5–15)
BUN: 7 mg/dL (ref 6–20)
CHLORIDE: 111 mmol/L (ref 101–111)
CO2: 24 mmol/L (ref 22–32)
Calcium: 7.6 mg/dL — ABNORMAL LOW (ref 8.9–10.3)
Creatinine, Ser: 0.85 mg/dL (ref 0.61–1.24)
GFR calc Af Amer: >60 mL/min (ref >60)
GFR calc non Af Amer: >60 mL/min (ref >60)
GLUCOSE: 171 mg/dL — AB (ref 65–99)
POTASSIUM: 3.3 mmol/L — AB (ref 3.5–5.1)
Sodium: 139 mmol/L (ref 135–145)

## 2018-02-26 LAB — GLUCOSE, CAPILLARY
GLUCOSE-CAPILLARY: 161 mg/dL — AB (ref 65–99)
Glucose-Capillary: 183 mg/dL — ABNORMAL HIGH (ref 65–99)
Glucose-Capillary: 151 mg/dL — ABNORMAL HIGH (ref 65–99)
Glucose-Capillary: 151 mg/dL — ABNORMAL HIGH (ref 65–99)

## 2018-02-26 MED ORDER — HYDROCODONE-ACETAMINOPHEN 5-325 MG PO TABS: 1.0000 | ORAL_TABLET | ORAL | Status: DC | PRN

## 2018-02-26 MED ORDER — INSULIN ASPART 100 UNIT/ML ~~LOC~~ SOLN
0.0000 [IU] | Freq: Every day | SUBCUTANEOUS | Status: DC
  Filled 2018-02-26: qty 1

## 2018-02-26 MED ORDER — POTASSIUM CHLORIDE CRYS ER 20 MEQ PO TBCR
20.0000 meq | EXTENDED_RELEASE_TABLET | Freq: Once | ORAL | Status: AC
  Administered 2018-02-26: 20 meq via ORAL
  Filled 2018-02-26: qty 1

## 2018-02-26 MED ORDER — INSULIN ASPART 100 UNIT/ML ~~LOC~~ SOLN
0.0000 [IU] | Freq: Three times a day (TID) | SUBCUTANEOUS | Status: DC
  Administered 2018-02-26 – 2018-02-27 (×3): 2 [IU] via SUBCUTANEOUS
  Filled 2018-02-26 (×3): qty 1

## 2018-02-26 MED ORDER — HYDROCODONE-ACETAMINOPHEN 5-325 MG PO TABS
1.0000 | ORAL_TABLET | ORAL | Status: DC | PRN
  Administered 2018-02-26: 2 via ORAL
  Administered 2018-02-26: 1 via ORAL
  Administered 2018-02-27: 2 via ORAL
  Filled 2018-02-26 (×3): qty 1
  Filled 2018-02-26: qty 2

## 2018-02-26 NOTE — Progress Notes (Signed)
Follow up - Critical Care Medicine Note  Patient Details:    [name] is an 67 y.o. male.S/P posterior cervical laminectomy. No difficulties overnight.  Lines, Airways, Drains: Arterial Line 02/25/18 Radial (Active)  Site Assessment Clean;Dry;Intact 02/25/2018  8:00 PM  Line Status Pulsatile blood flow 02/25/2018  8:00 PM  Art Line Waveform Appropriate 02/25/2018  8:00 PM  Art Line Interventions Zeroed and calibrated 02/25/2018  8:00 PM  Color/Movement/Sensation Capillary refill greater than 3 sec 02/25/2018  4:00 PM  Dressing Type Transparent 02/25/2018  8:00 PM  Dressing Status Clean;Dry;Intact 02/25/2018  8:00 PM  Dressing Change Due 03/04/18 02/25/2018  8:00 PM     Closed System Drain Posterior Neck Accordion (Hemovac) (Active)  Site Description Unable to view 02/26/2018  4:15 AM  Dressing Status Clean;Dry;Intact 02/26/2018  4:15 AM  Drainage Appearance Serosanguineous 02/26/2018  4:15 AM  Status Other (Comment) 02/26/2018  4:15 AM  Output (mL) 30 mL 02/26/2018  6:00 AM    Anti-infectives:  Anti-infectives (From admission, onward)   Start     Dose/Rate Route Frequency Ordered Stop   02/25/18 2000  vancomycin (VANCOCIN) 1,500 mg in sodium chloride 0.9 % 500 mL IVPB     1,500 mg 250 mL/hr over 120 Minutes Intravenous Every 12 hours 02/25/18 1859 02/26/18 1000   02/25/18 0615  vancomycin (VANCOCIN) IVPB 1000 mg/200 mL premix     1,000 mg 200 mL/hr over 60 Minutes Intravenous  Once 02/25/18 0605 02/25/18 0921   02/25/18 0605  vancomycin (VANCOCIN) 1-5 GM/200ML-% IVPB    Note to Pharmacy:  Manfred Arch   : cabinet override      02/25/18 1610 02/25/18 0821      Microbiology: Results for orders placed or performed during the hospital encounter of 02/21/18  Surgical pcr screen     Status: None   Collection Time: 02/21/18  2:44 PM  Result Value Ref Range Status   MRSA, PCR NEGATIVE NEGATIVE Final   Staphylococcus aureus NEGATIVE NEGATIVE Final    Comment: (NOTE) The Xpert SA Assay (FDA  approved for NASAL specimens in patients 66 years of age and older), is one component of a comprehensive surveillance program. It is not intended to diagnose infection nor to guide or monitor treatment. Performed at [facility], [address] [place], [place] [address]     Studies: Dg Chest 2 View  Result Date: 02/22/2018 CLINICAL DATA:  Hypertension. Preoperative for cervical spine surgery. EXAM: CHEST - 2 VIEW COMPARISON:  None. FINDINGS: Normal heart size. Normal mediastinal contour. No pneumothorax. No pleural effusion. Lungs appear clear, with no acute consolidative airspace disease and no pulmonary edema. IMPRESSION: No active cardiopulmonary disease. Electronically Signed   By: Delbert Phenix M.D.   On: 02/22/2018 08:17   Dg Cervical Spine 2-3 Views  Result Date: 02/25/2018 CLINICAL DATA:  Intraoperative fluoro spot view localizing the surgical site. EXAM: CERVICAL SPINE - 2-3 VIEW COMPARISON:  MRI of the cervical spine of February 13, 2018 FINDINGS: A metallic trocar is present and overlies the posterior inferior aspect of the spinous process of T4. Tissue spreader devices are present. IMPRESSION: The metallic clamp overlies the posterior tip of the C4 spinous process. Electronically Signed   By: David  [place] M.D.   On: 02/25/2018 13:58   Mr Cervical Spine Wo Contrast  Result Date: 02/14/2018 CLINICAL DATA:  BILATERAL hand and leg weakness for 2 months. The patient underwent CT scanning in May for a fall with BILATERAL arm pain. EXAM: MRI CERVICAL  SPINE WITHOUT CONTRAST TECHNIQUE: Multiplanar, multisequence MR imaging of the cervical spine was performed. No intravenous contrast was administered. COMPARISON:  CT of the head and cervical spine 12/29/2017. FINDINGS: Alignment: 2 mm anterolisthesis C4-5 and C7-T1 is facet mediated. Overall there is mild straightening of the normal cervical lordosis. Vertebrae: No worrisome osseous lesion. Cord: Spinal stenosis with cord compression and  abnormal cord signal at C3-4. No evidence for cord hemorrhage, or epidural/subdural fluid collection. Posterior Fossa, vertebral arteries, paraspinal tissues: No tonsillar herniation. Disc levels: C2-3:  Annular bulge.  Facet arthropathy.  No impingement. C3-4: Central protrusion. Posterior element hypertrophy involving facets and ligamentum flavum; this is slightly worse on the RIGHT. Cord compression with abnormal cord signal. Canal diameter 5 mm. BILATERAL C4 foraminal narrowing, RIGHT greater than LEFT. C4-5: 2 mm anterolisthesis. Annular bulge. Facet arthropathy, much worse on the RIGHT. BILATERAL uncinate spurring. Mild C5 foraminal narrowing. C5-6: Advanced disc space narrowing. Annular bulge with osseous spurring. Facet arthropathy, LEFT greater than RIGHT. Borderline canal stenosis with effacement anterior subarachnoid space. LEFT greater than RIGHT C6 foraminal narrowing. C6-7: Disc space narrowing. Central protrusion. Facet arthropathy. Effacement anterior subarachnoid space. Borderline LEFT C7 foraminal narrowing. C7-T1:  2 mm anterolisthesis.  Facet arthropathy.  No impingement. Compared with prior CT, there is no fracture. IMPRESSION: The dominant abnormality is at C3-C4, where central protrusion, in conjunction with posterior element hypertrophy results in cord compression with abnormal cord signal. Canal diameter 5 mm. RIGHT greater than LEFT C4 foraminal narrowing. Surgical consultation is warranted. Other areas of potentially significant neural impingement are observed at C4-5 and C5-6, as described above. Foraminal narrowing at these levels could contribute to upper extremity radiculopathy. Electronically Signed   By: Elsie Stain M.D.   On: 02/14/2018 08:59   Dg C-arm 1-60 Min-no Report  Result Date: 02/25/2018 Fluoroscopy was utilized by the requesting physician.  No radiographic interpretation.   Dg C-arm 1-60 Min-no Report  Result Date: 02/25/2018 Fluoroscopy was utilized by the  requesting physician.  No radiographic interpretation.    Consults: Treatment Team:  Enid Baas, MD   Subjective:    Overnight Issues: patient recovered well over evening. Is presently being seen by neurosurgery  Objective:  Vital signs for last 24 hours: Temp:  [97.8 F (36.6 C)-98.3 F (36.8 C)] 97.8 F (36.6 C) (05/07 0744) Pulse Rate:  [68-97] 74 (05/07 0744) Resp:  [15-27] 20 (05/07 0744) BP: (113-174)/(68-108) 131/81 (05/07 0600) SpO2:  [90 %-100 %] 94 % (05/07 0744) Arterial Line BP: (127-182)/(59-92) 165/68 (05/07 0744) Weight:  [221 lb 1.9 oz (100.3 kg)] 221 lb 1.9 oz (100.3 kg) (05/06 1814)  Hemodynamic parameters for last 24 hours:    Intake/Output from previous day: 05/06 0701 - 05/07 0700 In: 4213.3 [I.V.:3713.3; IV Piggyback:500] Out: 1995 [Urine:1925; Drains:40; Blood:30]  Intake/Output this shift: No intake/output data recorded.  Vent settings for last 24 hours:    Physical Exam:  Vital signs: Please see the above listed vital signs Cardiovascular: Regular rate and rhythm  Pulmonary: Clear to auscultation Abdominal: Positive bowel sounds Extremities: No clubbing cyanosis or edema noted  Assessment/Plan:   Patient is status post posterior cervical laminectomy doing well. At this point is stable for floor transfer per neurosurgery. Discussed with Dr. Jennye Boroughs, patient will be transferred to the hospitalist service and to Med / Surg. We'll place a consult for physical therapy.   Preston Weill 02/26/2018  *Care during the described time interval was provided by me and/or other providers on the critical care team.  I have reviewed this patient's available data, including medical history, events of note, physical examination and test results as part of my evaluation.

## 2018-02-26 NOTE — Consult Note (Signed)
[name] NAME: [name]    MR#:  [id number]  DATE OF BIRTH:  Dec 07, 1950  DATE OF ADMISSION:  02/25/2018  PRIMARY CARE PHYSICIAN: Carmon Ginsberg, PA   REQUESTING/REFERRING PHYSICIAN: Dr. Alexander Bergeron  CHIEF COMPLAINT:  No chief complaint on file.   HISTORY OF PRESENT ILLNESS:  [name]  is a 67 y.o. male with a known history of non-insulin-dependent diabetes mellitus, CAD status post stent, arthritis presents to hospital for an elective cervical laminectomy. Patient started having complaints of weakness and trouble walking for 2 months.  Started with fluids at illness and he had trouble recovering from it.  In spite of getting that treated, he was suffering intermittent falls.  Also complains of radicular burning pain going down both his arms and legs worse on the right side.  MRI done by neurology as outpatient showing canal stenosis at C3 and C4 with cord signal changes.  He was admitted under neurosurgery service. Patient had posterior C3 cervical laminoplasty done yesterday.  Doing well.  Cervical collar placed today.  Awaiting physical therapy and occupational therapy and pain control -Medical consult requested for medical management on the floor  PAST MEDICAL HISTORY:   Past Medical History:  Diagnosis Date  . Arthritis    osteoarthriis of back  . Coronary artery disease   . Depression   . Diabetes mellitus without complication ([place])   . GERD (gastroesophageal reflux disease)   . Hyperlipidemia   . Hypertension     PAST SURGICAL HISTOIRY:   Past Surgical History:  Procedure Laterality Date  . CATARACT EXTRACTION W/PHACO Right 03/01/2017   Procedure: CATARACT EXTRACTION PHACO AND INTRAOCULAR LENS PLACEMENT (IOC);  Surgeon: Eulogio Bear, MD;  Location: [facility];  Service: Ophthalmology;  Laterality: Right;  [place] 22.6 AP% 6.1 CDE 1.37 Fluid pack lot # [id number]  . CATARACT EXTRACTION W/PHACO Left  05/01/2017   Procedure: CATARACT EXTRACTION PHACO AND INTRAOCULAR LENS PLACEMENT (IOC)  Left Diabetic;  Surgeon: Eulogio Bear, MD;  Location: [facility];  Service: Ophthalmology;  Laterality: Left;  Block Diabetic  . CORONARY ANGIOPLASTY     STENT 2009  . Stint  2007    SOCIAL HISTORY:   Social History   Tobacco Use  . Smoking status: Current Every Day Smoker    Types: Cigars  . Smokeless tobacco: Never Used  . Tobacco comment: smoker, Has been smoking for 12 years, smokes cigars; smokesabout 10-15 cigars a week(varies)  Substance Use Topics  . Alcohol use: No    FAMILY HISTORY:   Family History  Problem Relation Age of Onset  . Diabetes Mother        mellitus type2    DRUG ALLERGIES:   Allergies  Allergen Reactions  . Amoxicillin     diarrhea and headache Has patient had a PCN reaction causing immediate rash, facial/tongue/throat swelling, SOB or lightheadedness with hypotension: No Has patient had a PCN reaction causing severe rash involving mucus membranes or skin necrosis: No Has patient had a PCN reaction that required hospitalization: No Has patient had a PCN reaction occurring within the last 10 years: Unknown If all of the above answers are "NO", then may proceed with Cephalosporin use.   Marland Kitchen Fexofenadine     Unknown reaction     REVIEW OF SYSTEMS:   Review of Systems  Constitutional: Negative for chills, fever, malaise/fatigue and weight loss.  HENT: Negative for ear discharge, ear pain, hearing loss,  nosebleeds and tinnitus.   Eyes: Negative for blurred vision, double vision and photophobia.  Respiratory: Negative for cough, hemoptysis, shortness of breath and wheezing.   Cardiovascular: Negative for chest pain, palpitations, orthopnea and leg swelling.  Gastrointestinal: Negative for abdominal pain, constipation, diarrhea, heartburn, melena, nausea and vomiting.  Genitourinary: Negative for dysuria, frequency, hematuria and urgency.   Musculoskeletal: Positive for neck pain. Negative for back pain and myalgias.  Skin: Negative for rash.  Neurological: Negative for dizziness, tingling, sensory change, speech change, focal weakness and headaches.  Endo/Heme/Allergies: Does not bruise/bleed easily.  Psychiatric/Behavioral: Negative for depression.     MEDICATIONS AT HOME:   Prior to Admission medications   Medication Sig Start Date End Date Taking? Authorizing Provider  acetaminophen (TYLENOL) 500 MG tablet Take 1,000 mg by mouth every 6 (six) hours as needed.   Yes [provider]  aspirin 81 MG tablet Take 81 mg by mouth daily.    Yes [provider]  atorvastatin (LIPITOR) 80 MG tablet TAKE ONE (1) TABLET EACH DAY 12/20/17  Yes Carmon Ginsberg, PA  Calcium Citrate-Vitamin D (CALCIUM + D PO) Take 1 tablet by mouth daily.   Yes [provider]  clopidogrel (PLAVIX) 75 MG tablet Take 1 tablet (75 mg total) by mouth daily. 12/20/17  Yes Carmon Ginsberg, PA  fenofibrate (TRICOR) 145 MG tablet Take 1 tablet (145 mg total) by mouth daily. 12/20/17  Yes Carmon Ginsberg, PA  gabapentin (NEURONTIN) 300 MG capsule TAKE 1 CAPSULE(300 MG) BY MOUTH AT BEDTIME Patient taking differently: Take 300 mg by mouth 3 times daily 02/06/18  Yes Carmon Ginsberg, PA  hydrochlorothiazide (HYDRODIURIL) 25 MG tablet Take 1 tablet (25 mg total) by mouth daily. 12/20/17  Yes Carmon Ginsberg, PA  HYDROcodone-acetaminophen (NORCO/VICODIN) 5-325 MG tablet Every 6 hours as needed for low back pain 02/21/18  Yes Carmon Ginsberg, PA  lisinopril (PRINIVIL,ZESTRIL) 5 MG tablet Take 1 tablet (5 mg total) by mouth daily. 12/20/17  Yes Chauvin, Herbie [place], PA  metFORMIN (GLUCOPHAGE) 1000 MG tablet TAKE ONE (1) TABLET BY MOUTH TWO (2) TIMES DAILY WITH FOOD 12/20/17  Yes Carmon Ginsberg, PA  metoprolol tartrate (LOPRESSOR) 50 MG tablet Take 1 tablet (50 mg total) by mouth 2 (two) times daily. 12/20/17  Yes Chauvin, Herbie [place], PA  pantoprazole (PROTONIX)  40 MG tablet TAKE ONE (1) TABLET BY MOUTH EVERY DAY AS NEEDED FOR HEARTBURN OR ACID REFLUX 12/20/17  Yes Carmon Ginsberg, PA  Potassium 99 MG TABS Take 1 tablet by mouth daily.   Yes [provider]  sitaGLIPtin (JANUVIA) 100 MG tablet Take 1 tablet (100 mg total) by mouth daily. 12/20/17  Yes Carmon Ginsberg, PA  Vitamin D, Ergocalciferol, (DRISDOL) 50000 units CAPS capsule Take 50,000 Units by mouth once a week. 02/12/18  Yes [provider]  blood glucose meter kit and supplies KIT Dispense based on patient and insurance preference. Check sugars once daily 11/24/15   Carmon Ginsberg, PA  [facility] VERIO test strip CHECK BLOOD SUGAR ONCE DAILY 09/04/17   Carmon Ginsberg, [place]      VITAL SIGNS:  Blood pressure 138/83, pulse 70, temperature 97.8 F (36.6 C), temperature source Oral, resp. rate (!) 25, height 6' (1.829 m), weight 100.3 kg (221 lb 1.9 oz), SpO2 97 %.  PHYSICAL EXAMINATION:   Physical Exam  GENERAL:  67 y.o.-year-old patient lying in the bed with no acute distress.  EYES: Pupils equal, round, reactive to light and accommodation. No scleral icterus. Extraocular muscles intact.  HEENT: Head atraumatic,  normocephalic. Oropharynx and nasopharynx clear.  NECK:  Supple, no jugular venous distention. Cervical collar in place. No thyroid enlargement, no tenderness.  LUNGS: Normal breath sounds bilaterally, no wheezing, rales,rhonchi or crepitation. No use of accessory muscles of respiration.  CARDIOVASCULAR: S1, S2 normal. No murmurs, rubs, or gallops.  ABDOMEN: Soft, nontender, nondistended. Bowel sounds present. No organomegaly or mass.  EXTREMITIES: No pedal edema, cyanosis, or clubbing.  NEUROLOGIC: Cranial nerves II through XII are intact. Muscle strength 5/5 in all extremities. Sensation intact. Gait not checked.  PSYCHIATRIC: The patient is alert and oriented x 3.  SKIN: No obvious rash, lesion, or ulcer.   LABORATORY PANEL:   CBC Recent Labs  Lab  02/26/18 0604  WBC 8.4  HGB 11.5*  HCT 33.7*  PLT 206   ------------------------------------------------------------------------------------------------------------------  Chemistries  Recent Labs  Lab 02/26/18 0604  NA 139  K 3.3*  CL 111  CO2 24  GLUCOSE 171*  BUN 7  CREATININE 0.85  CALCIUM 7.6*   ------------------------------------------------------------------------------------------------------------------  Cardiac Enzymes No results for input(s): TROPONINI in the last 168 hours. ------------------------------------------------------------------------------------------------------------------  RADIOLOGY:  Dg Cervical Spine 2-3 Views  Result Date: 02/25/2018 CLINICAL DATA:  Intraoperative fluoro spot view localizing the surgical site. EXAM: CERVICAL SPINE - 2-3 VIEW COMPARISON:  MRI of the cervical spine of February 13, 2018 FINDINGS: A metallic trocar is present and overlies the posterior inferior aspect of the spinous process of T4. Tissue spreader devices are present. IMPRESSION: The metallic clamp overlies the posterior tip of the C4 spinous process. Electronically Signed   By: David  Martin[place] M.D.   On: 02/25/2018 13:58   Dg C-arm 1-60 Min-no Report  Result Date: 02/25/2018 Fluoroscopy was utilized by the requesting physician.  No radiographic interpretation.   Dg C-arm 1-60 Min-no Report  Result Date: 02/25/2018 Fluoroscopy was utilized by the requesting physician.  No radiographic interpretation.    EKG:   Orders placed or performed during the hospital encounter of 02/21/18  . EKG 12-Lead  . EKG 12-Lead    IMPRESSION AND PLAN:   Kandon Hosking  is a 67 y.o. male with a known history of non-insulin-dependent diabetes mellitus, CAD status post stent, arthritis presents to hospital for an elective cervical laminectomy.  1.  Cervical myelopathy-patient admitted to neurosurgery service.  Status post C3 cervical laminoplasty.  Postop day 1 today -Much improved  radicular symptoms. -Cervical collar in place.  Will need physical therapy and Occupational Therapy consults -Continue pain control. -Diet being advanced.  2.  Hypokalemia-being replaced  3.  Hypertension-need to avoid hypotension.  Hold his hydrochlorothiazide due to electrolyte abnormalities for now. -Continue lisinopril.  4.  Diabetes mellitus-on Tradjenta and metformin.  Add sliding scale insulin  5.  DVT prophylaxis-encourage ambulation, teds and SCDs for now    All the records are reviewed and case discussed with Consulting provider. Management plans discussed with the patient, family and they are in agreement.  CODE STATUS: Full Code  TOTAL TIME TAKING CARE OF THIS PATIENT: 50 minutes.    Gladstone Lighter M.D on 02/26/2018 at 11:36 AM  Between 7am to 6pm - Pager - [phone]  After 6pm go to [email or web address] - password [email or web address] [facility]  [facility]  Office  [phone]  CC: Primary care Physician: Carmon Ginsberg, [place]

## 2018-02-26 NOTE — Evaluation (Signed)
Physical Therapy Evaluation Patient Details Name: [name] MRN: [id number] DOB: 17-Dec-1950 Today's Date: 02/26/2018   History of Present Illness  Pt is a 67 y.o. male with a known history of HTN, legally blind, HLD, GERD, arthritis, non-insulin-dependent diabetes mellitus, CAD status post stent, and cervical spondylic myelopathy presents to hospital for an elective cervical laminectomy after experiencing weakness, trouble walking, and intermittent falls with radicular burning pain going down both his arms and legs worse on the right side.  Assessment includes: C3 laminoplasty, hypokalemia, HTN, and DM.    Clinical Impression  Pt presents with minor deficits in strength, transfers, mobility, gait, balance, and activity tolerance.  Pt's presented with no detectable asymmetry in BLE strength with BLE sensation to light touch and proprioception intact.  Pt did fatigue during amb 100' with a RW with bilateral step length and clearance during swing through gradually declining.  Pt's SpO2 and HR were WNL throughout the session with no adverse symptoms other than general fatigue and 5-6/10 cervical pain that did not worsen with mobility.  Pt was steady ascending and descending stair with B rails with reciprocal gait pattern.  Pt will benefit from HHPT services upon discharge to safely address above deficits for decreased caregiver assistance and eventual return to PLOF.       Follow Up Recommendations Home health PT;Supervision for mobility/OOB    Equipment Recommendations  Rolling walker with 5" wheels    Recommendations for Other Services       Precautions / Restrictions Precautions Precautions: Fall;Cervical Precaution Booklet Issued: Yes (comment) Precaution Comments: Cervical precautions reviewed Required Braces or Orthoses: Cervical Brace Cervical Brace: Other (comment);Hard collar Restrictions Weight Bearing Restrictions: No Other Position/Activity Restrictions: Don/doff cervical  collar in sitting, collar may be removed while in bed and pt may amb to bathroom without collar donned      Mobility  Bed Mobility               General bed mobility comments: deferred, pt up in recliner  Transfers Overall transfer level: Needs assistance Equipment used: None Transfers: Sit to/from Stand Sit to Stand: Min guard         General transfer comment: Min verbal cues for sequencing  Ambulation/Gait   Ambulation Distance (Feet): 1 x 30 ft and 1 x 100 ft with a RW, 2 x 10 ft without AD Assistive device: Rolling walker (2 wheeled) and without AD Gait Pattern/deviations: Step-through pattern;Decreased step length - right;Decreased step length - left Gait velocity: Reduced   General Gait Details: Slow cadence with gait but steady without LOB with SpO2 and HR WNL pre and post amb  Stairs Stairs: Yes Stairs assistance: Min guard Stair Management: Two rails Number of Stairs: 4 General stair comments: Pt steady ascending and descending steps with reciprocal pattern  Wheelchair Mobility    Modified Rankin (Stroke Patients Only)       Balance Overall balance assessment: Needs assistance;History of Falls Sitting-balance support: No upper extremity supported;Feet supported Sitting balance-Leahy Scale: Good     Standing balance support: During functional activity;No upper extremity supported Standing balance-Leahy Scale: Fair Standing balance comment: Fair stability during amb without AD in room with no reaching for furniture in the room                             Pertinent Vitals/Pain Pain Assessment: 0-10 Pain Score: 5  Pain Location: cervical insision site Pain Descriptors / Indicators: Aching;Operative site guarding  Pain Intervention(s): Premedicated before session;Monitored during session    Home Living Family/patient expects to be discharged to:: Private residence Living Arrangements: Other relatives(Granddaughter) Available Help at  Discharge: Family;Available 24 hours/day Type of Home: Mobile home Home Access: Stairs to enter Entrance Stairs-Rails: Can reach both;Left;Right Entrance Stairs-Number of Steps: 3-4 Home Layout: One level Home Equipment: Shower seat;Cane - single point      Prior Function Level of Independence: Independent with assistive device(s)         Comments: Pt mod indep using SPC for mobility (also endorses "furniture cruising" with free hand), remote supervision for showering for safety, and does not drive. Has family provide transportation, get groceries, gdtr does cooking and cleaning, dtr organizes medications in a weekly pill box for pt. Pt and granddaughter report 3-4 falls in the last 6 months.       Hand Dominance   Dominant Hand: Left    Extremity/Trunk Assessment   Upper Extremity Assessment Upper Extremity Assessment: Defer to OT evaluation    Lower Extremity Assessment Lower Extremity Assessment: Generalized weakness;LLE deficits/detail;RLE deficits/detail RLE Deficits / Details: RLE knee flex, knee ext, hip flex, and ankle DF grossly 4/5 RLE Sensation: WNL LLE Deficits / Details: LLE knee flex, knee ext, hip flex, and ankle DF grossly 4/5 LLE Sensation: WNL    Cervical / Trunk Assessment Cervical / Trunk Assessment: Normal  Communication   Communication: No difficulties  Cognition Arousal/Alertness: Awake/alert Behavior During Therapy: WFL for tasks assessed/performed Overall Cognitive Status: Within Functional Limits for tasks assessed                                        General Comments      Exercises Other Exercises Other Exercises: Pt/family instructed in compression stocking mgt with visual demo; gdtr verbalizes understanding and reports comfort with performing at home with pt Other Exercises: Pt/family instructed in falls prevention strategies with verbalized understanding   Assessment/Plan    PT Assessment Patient needs continued  PT services  PT Problem List Decreased strength;Decreased activity tolerance;Decreased balance;Decreased mobility       PT Treatment Interventions DME instruction;Stair training;Functional mobility training;Balance training;Therapeutic activities;Gait training;Therapeutic exercise;Patient/family education    PT Goals (Current goals can be found in the Care Plan section)  Acute Rehab PT Goals Patient Stated Goal: Get strong enough to work in the yard again PT Goal Formulation: With patient Time For Goal Achievement: 03/11/18 Potential to Achieve Goals: Fair    Frequency Min 2X/week   Barriers to discharge        Co-evaluation               AM-PAC PT "6 Clicks" Daily Activity  Outcome Measure Difficulty turning over in bed (including adjusting bedclothes, sheets and blankets)?: A Little Difficulty moving from lying on back to sitting on the side of the bed? : A Little Difficulty sitting down on and standing up from a chair with arms (e.g., wheelchair, bedside commode, etc,.)?: Unable Help needed moving to and from a bed to chair (including a wheelchair)?: A Little Help needed walking in hospital room?: A Little Help needed climbing 3-5 steps with a railing? : A Little 6 Click Score: 16    End of Session Equipment Utilized During Treatment: Gait belt Activity Tolerance: Patient tolerated treatment well;No increased pain Patient left: in chair;with chair alarm set;with family/visitor present;with call bell/phone within reach Nurse Communication: Mobility status  PT Visit Diagnosis: Unsteadiness on feet (R26.81);History of falling (Z91.81);Muscle weakness (generalized) (M62.81);Difficulty in walking, not elsewhere classified (R26.2)    Time: 1440-1505 PT Time Calculation (min) (ACUTE ONLY): 25 min   Charges:   PT Evaluation $PT Eval Low Complexity: 1 Low PT Treatments $Gait Training: 8-22 mins   PT G Codes:        DElly Modena PT, DPT 02/26/18, 3:26 PM

## 2018-02-26 NOTE — Anesthesia Postprocedure Evaluation (Signed)
Anesthesia Post Note  Patient: [name]  Procedure(s) Performed: POSTERIOR CERVICAL LAMINECTOMY-CERVICAL 3 (N/A Spine Cervical)  Patient location during evaluation: ICU Anesthesia Type: General Level of consciousness: awake and alert and oriented Pain management: satisfactory to patient Vital Signs Assessment: post-procedure vital signs reviewed and stable Respiratory status: respiratory function stable Cardiovascular status: stable Anesthetic complications: no     Last Vitals:  Vitals:   02/26/18 0744 02/26/18 0800  BP:  125/78  Pulse: 74 84  Resp: 20 (!) 21  Temp: 36.6 C   SpO2: 94% 94%    Last Pain:  Vitals:   02/26/18 0744  TempSrc: Oral  PainSc: 0-No pain                 Clydene Pugh

## 2018-02-26 NOTE — Progress Notes (Signed)
Clinical Social Worker (CSW) received SNF consult. PT is recommending home health. RN case manager aware of above. Please reconsult if future social work needs arise. CSW signing off.   Stephen Goracke, LCSW [phone] 

## 2018-02-26 NOTE — Progress Notes (Addendum)
INPATIENT ICU PROGRESS  Neurosurgery Progress Note  [name] [id number] 02/25/2018  ACTIVE PROBLEMS: Active Problems:   Cervical myelopathy (HCC)    ASSESSMENT/PLAN POD#1 status post C3 cervical laminoplasty  1.  Drain removed; 1 additional dose of vancomycin and then stop 2.  Relax MAP goals; goal for normotension; may resume established antihypertensive therapies 3.  Cleared to transfer to floor; PT/OT/social work; mobilize 4.  Clear to advance diet as tolerated 5.  C-collar today; this is primarily for comfort and when traveling 6.  Likely discharge home tomorrow; may continue hydrocodone/acetaminophen for pain after discharge.  Recommend robaxin prn for muscle spasm.   DIAGNOSIS: Cervical spondylosis with myelopathy   Total time of __20_____minutes was spent with patient and on patient floor reviewing labs and/or imaging studies, documenting, counseling and/or coordinating care directly related to patient's critical care.  This note was dictated using voice recognition software.  Please contact me if there are any questions regarding its content.  Electronically signed by: Erskine Emery, III, MD, 02/26/2018 7:32 AM   ______________________________________________________________________ SUBJECTIVE  Interval History: Doing well this morning; pain well controlled; no complaints   Scheduled Meds: . atorvastatin  80 mg Oral q1800  . docusate sodium  100 mg Oral BID  . fenofibrate  145 mg Oral QPC breakfast  . gabapentin  300 mg Oral QHS  . hydrochlorothiazide  25 mg Oral Daily  . linagliptin  5 mg Oral Daily  . lisinopril  5 mg Oral Daily  . metFORMIN  1,000 mg Oral BID WC  . metoprolol tartrate  50 mg Oral BID  . pantoprazole  40 mg Oral Daily  . senna  1 tablet Oral BID  . sodium chloride flush  3 mL Intravenous Q12H     Continuous Infusions: . sodium chloride    . 0.9 % NaCl with KCl 20 mEq / L 100 mL/hr at 02/25/18 1952  . vancomycin Stopped (02/25/18  2152)     PRN Meds:HYDROcodone-acetaminophen, menthol-cetylpyridinium **OR** phenol, methocarbamol, morphine injection, ondansetron (ZOFRAN) IV, polyethylene glycol, sodium chloride flush    OBJECTIVE  Vitals Current Average / Min / Max     Temp    98.1 F (36.7 C)    Temp  Min: 97.8 F (36.6 C)  Max: 98.3 F (36.8 C)     BP     131/81     BP  Min: 113/73  Max: 174/97     HR    77    Pulse  Avg: 84.6  Min: 68  Max: 97     RR    (!) 26    Resp  Avg: 21.3  Min: 15  Max: 27     Sats    96 %    SpO2  Min: 90 %  Max: 100 %     Weight    100.3 kg (221 lb 1.9 oz)    Admit: 100.3 kg (221 lb 1.9 oz)    Intake/Output last 3 shifts: I/O last 3 completed shifts: In: 4213.3 [I.V.:3713.3; IV Piggyback:500] Out: 1995 [Urine:1925; Drains:40; Blood:30]  Intake/Output Summary (Last 24 hours) at 02/26/2018 0732 Last data filed at 02/26/2018 0600 Gross per 24 hour  Intake 4213.33 ml  Output 1995 ml  Net 2218.33 ml     Examination: Awake, alert, oriented by 3 Pupils equal and reactive Face is symmetric Tongue is midline Motor: 5/5 No pronator drift Dressing and drain intact  Lab Results  Component Value Date   NA 139 02/26/2018  K 3.3 (L) 02/26/2018   CL 111 02/26/2018   WBC 8.4 02/26/2018   HGB 11.5 (L) 02/26/2018   HCT 33.7 (L) 02/26/2018   INR 1.00 02/21/2018

## 2018-02-26 NOTE — Discharge Instructions (Signed)
No lifting greater than 10 pounds for the next 6 weeks.  No repetitive bending, twisting, or jumping.  Please wear your cervical collar if you leave the house or go walking.  You do not need to wear your collar when in your house. No strenuous physical activity. You may walk as much as tolerated You may remove your dressing on your second postoperative day.  You may shower on your third postoperative day. It is ok to get the incision wet No soaking in tub or pool for 4 weeks post op or until incision is completely healed.  You cannot drive until seen in follow up or until off narcotics (pain medications) You may resume NSAIDS such as aspirin, ibuprofen, naproxen or blood thinners 7 days after surgery unless directed differently. Take Senna or miralax for stool softener to prevent constipation while on narcotic pain medicine. If no results then use a OTC glycerin suppository. If still no results after a suppository, please call the office to discuss Follow up in office in 10-14 days--if you do not already have an appointment, call [phone] and ask to speak with Patty. Call if you have any concerns or questions, or if you develop any redness, swelling or drainage from your incision; during normal business hours call [phone] and ask for Royston Cowper; after business hours call [phone] and ask to speak with the neurosurgeon on call.

## 2018-02-26 NOTE — Evaluation (Signed)
Occupational Therapy Evaluation Patient Details Name: [name] MRN: [id number] DOB: [birth year] Today's Date: 02/26/2018    History of Present Illness 67 y.o. male with a known history of HTN, HLD, GERD, arthritis, non-insulin-dependent diabetes mellitus, CAD status post stent, and cervical spondylic myelopathy presents to hospital for an elective cervical laminectomy after experiencing weakness, trouble walking, and intermittent falls with radicular burning pain going down both his arms and legs worse on the right side.   Clinical Impression   Pt seen for OT evaluation this date, POD#1 from C3 laminoplasty. Prior to hospital admission, pt was modified independent with mobility using a SPC and admits to "furniture cruising" with other hand, requested remote supervision from granddaughter for showers, and family would assist with all transportation, groceries, cooking, cleaning, and medication mgt. Pt enjoys yardwork but has been unable to participate in this meaningful occupation due to increasing pain, weakness, trouble walking, and recent falls. Pt lives at home with his granddaughter and has very supportive family. 3-4 steps to enter mobile home and bilateral handrails. Per neurosurgery note, c-collar "primarily for comfort and when traveling." Currently pt requires CGA for mobility with VC for precautions and requires min assist for bathing and dressing tasks. Pt/family educated in cervical spinal precautions, self care skills, AE, and home/routines modifications to maximize safety and functional independence while minimizing falls risk and maintaining precautions. Pt/family verbalized understanding of all education/training provided. Handout provided to support recall and carry over of learned precautions/techniques for bed mobility, functional transfers, and self care skills while maintaining standard cervical spinal precautions (no bending, lifting, twisting). Pt will benefit from additional  skilled OT services to address noted impairments and functional deficits in order to maximize safety and return to PLOF with less pain and less falls risk. Upon hospital discharge, recommend initial HHOT services and 24/7 supervision/assist from family for mobility.      Follow Up Recommendations  Home health OT;Supervision/Assistance - 24 hour    Equipment Recommendations  3 in 1 bedside commode;Other (comment)(HH shower head, reacher)    Recommendations for Other Services       Precautions / Restrictions Precautions Precautions: Fall;Cervical Precaution Booklet Issued: Yes (comment) Precaution Comments: Pt verbalized understanding of standard cervical spinal precautions (no bending, lifting, or twisting). Pt would benefit from additional instruction to maximize recall and carryover during mobility and ADL. Restrictions Weight Bearing Restrictions: No      Mobility Bed Mobility               General bed mobility comments: deferred, pt up in recliner already  Transfers Overall transfer level: Needs assistance Equipment used: Straight cane Transfers: Sit to/from Stand Sit to Stand: Min guard         General transfer comment: VC for technique to minimize bending/twisting     Balance Overall balance assessment: Needs assistance;History of Falls Sitting-balance support: No upper extremity supported;Feet supported Sitting balance-Leahy Scale: Good     Standing balance support: Single extremity supported Standing balance-Leahy Scale: Poor Standing balance comment: requires at least [facility] and often using other hand to reach out for furniture in the room for additional stability                           ADL either performed or assessed with clinical judgement   ADL Overall ADL's : Needs assistance/impaired Eating/Feeding: Independent;Sitting   Grooming: Min guard;Standing   Upper Body Bathing: Sitting;Minimal assistance;With caregiver independent  assisting   Lower Body  Bathing: With caregiver independent assisting;Minimal assistance;Moderate assistance;Sitting/lateral leans Lower Body Bathing Details (indicate cue type and reason): instructed in seated shower once cleared to shower by surgeon with [facility] shower head to improve safety and minimize risk of falls Upper Body Dressing : Sitting;With caregiver independent assisting;Minimal assistance   Lower Body Dressing: Sit to/from stand;Minimal assistance;Moderate assistance   Toilet Transfer: Ambulation;Regular Toilet;Min Pension scheme manager Details (indicate cue type and reason): SPC, verbal cues for safety, CGA t/o; instructed in transfer techniques to minimize bending/twisting Toileting- Clothing Manipulation and Hygiene: Sitting/lateral lean;Independent     Tub/Shower Transfer Details (indicate cue type and reason): instructed in seated shower once cleared Functional mobility during ADLs: Min guard;Cane(would likely benefit from trial with RW)       Vision Baseline Vision/History: No visual deficits Patient Visual Report: No change from baseline Vision Assessment?: No apparent visual deficits     Perception     Praxis      Pertinent Vitals/Pain Pain Assessment: 0-10 Pain Score: 4  Pain Location: cervical insision site Pain Descriptors / Indicators: Aching Pain Intervention(s): Limited activity within patient's tolerance;Monitored during session;Premedicated before session     Hand Dominance Left   Extremity/Trunk Assessment Upper Extremity Assessment Upper Extremity Assessment: Overall WFL for tasks assessed(gross strength, coordination, sensation all intact bilaterally)   Lower Extremity Assessment Lower Extremity Assessment: Defer to PT evaluation;Overall WFL for tasks assessed(gross strength, coordination, sensation all intact bilaterally)   Cervical / Trunk Assessment Cervical / Trunk Assessment: Normal   Communication Communication Communication: No  difficulties   Cognition Arousal/Alertness: Awake/alert Behavior During Therapy: WFL for tasks assessed/performed Overall Cognitive Status: Within Functional Limits for tasks assessed                                     General Comments       Exercises Other Exercises Other Exercises: Pt/family instructed in compression stocking mgt with visual demo; gdtr verbalizes understanding and reports comfort with performing at home with pt Other Exercises: Pt/family instructed in falls prevention strategies with verbalized understanding   Shoulder Instructions      Home Living Family/patient expects to be discharged to:: Private residence Living Arrangements: Alone Available Help at Discharge: Family;Available 24 hours/day Type of Home: Mobile home Home Access: Stairs to enter Entrance Stairs-Number of Steps: 3-4 Entrance Stairs-Rails: Can reach both;Left;Right Home Layout: One level     Bathroom Shower/Tub: Chief Strategy Officer: Standard     Home Equipment: Production assistant, radio - single point          Prior Functioning/Environment Level of Independence: Independent with assistive device(s)        Comments: Pt mod indep using SPC for mobility (also endorses "furniture cruising" with free hand), remote supervision for showering for safety, and does not drive. Has family provide transportation, get groceries, gdtr does cooking, dtr organizes medications in a weekly pill box for pt. Pt endorses a few recent falls 2:2 LOB which he is hopefully will improve after surgery.        OT Problem List: Decreased knowledge of use of DME or AE;Impaired balance (sitting and/or standing);Decreased safety awareness;Pain;Decreased knowledge of precautions      OT Treatment/Interventions: Self-care/ADL training;Balance training;Therapeutic exercise;Therapeutic activities;DME and/or AE instruction;Patient/family education    OT Goals(Current goals can be found in the  care plan section) Acute Rehab OT Goals Patient Stated Goal: go home and return to PLOF with less  pain and no more falls OT Goal Formulation: With patient/family Time For Goal Achievement: 03/12/18 Potential to Achieve Goals: Good ADL Goals Pt Will Perform Lower Body Dressing: with caregiver independent in assisting;with supervision;with set-up;with adaptive equipment;sit to/from stand(either mod I w/ AE or caregiver assist, maintain precautions) Pt Will Transfer to Toilet: with supervision;ambulating(LRAD for amb, elevated commode, maintain precautions) Additional ADL Goal #1: Pt will verbalize 100% of cervical spinal precautions and return demo learned technique with supervision and <20% verbal cues to maintain precautions.  OT Frequency: Min 1X/week   Barriers to D/C:            Co-evaluation              AM-PAC PT "6 Clicks" Daily Activity     Outcome Measure Help from another person eating meals?: None Help from another person taking care of personal grooming?: A Little Help from another person toileting, which includes using toliet, bedpan, or urinal?: A Little Help from another person bathing (including washing, rinsing, drying)?: A Little Help from another person to put on and taking off regular upper body clothing?: A Little Help from another person to put on and taking off regular lower body clothing?: A Little 6 Click Score: 19   End of Session Equipment Utilized During Treatment: Gait belt  Activity Tolerance: Patient tolerated treatment well Patient left: in chair;with call bell/phone within reach;with chair alarm set;with family/visitor present  OT Visit Diagnosis: Other abnormalities of gait and mobility (R26.89);Pain;History of falling (Z91.81) Pain - Right/Left: (pain in cervical incision site)                Time: 5366-4403 OT Time Calculation (min): 33 min Charges:  OT General Charges $OT Visit: 1 Visit OT Evaluation $OT Eval Moderate Complexity: 1  Mod OT Treatments $Self Care/Home Management : 8-22 mins  Richrd Prime, MPH, MS, OTR/L ascom [phone] 02/26/18, 2:30 PM

## 2018-02-26 NOTE — Progress Notes (Signed)
OT Cancellation Note  Patient Details Name: [name] MRN: [id number] DOB: 1951-04-11   Cancelled Treatment:    Reason Eval/Treat Not Completed: Other (comment). Order received, chart reviewed. Spoke with RN by phone who confirmed pt will be transferring to Med Surg shortly. Will hold OT evaluation until pt is transferred and re-attempt.  Richrd Prime, MPH, MS, OTR/L ascom [phone] 02/26/18, 10:41 AM

## 2018-02-27 LAB — GLUCOSE, CAPILLARY: Glucose-Capillary: 162 mg/dL — ABNORMAL HIGH (ref 65–99)

## 2018-02-27 MED ORDER — METHOCARBAMOL 500 MG PO TABS: 500.0000 mg | ORAL_TABLET | Freq: Four times a day (QID) | ORAL | 0 refills | Status: DC | PRN

## 2018-02-27 MED ORDER — DOCUSATE SODIUM 100 MG PO CAPS: 100.0000 mg | ORAL_CAPSULE | Freq: Two times a day (BID) | ORAL | 0 refills | Status: DC

## 2018-02-27 MED ORDER — SENNA 8.6 MG PO TABS: 1.0000 | ORAL_TABLET | Freq: Two times a day (BID) | ORAL | 0 refills | Status: DC

## 2018-02-27 MED ORDER — HYDROCODONE-ACETAMINOPHEN 5-325 MG PO TABS: 1.0000 | ORAL_TABLET | Freq: Four times a day (QID) | ORAL | 0 refills | Status: AC | PRN

## 2018-02-27 NOTE — Progress Notes (Signed)
Pt up in chair at present. Medicated per mar. Call bell in reach.

## 2018-02-27 NOTE — Discharge Summary (Signed)
Physician Discharge Summary  Patient ID: [name] MRN: [id number] DOB/AGE: April 20, 1951 67 y.o.  Admit date: 02/25/2018 Discharge date: 02/27/2018  Admission Diagnoses: [place]  Discharge Diagnoses:  Active Problems:   Cervical myelopathy ([place])   S/P C3 LAMINOPLASTY   Discharged Condition: good  Hospital Course: The patient was admitted on 02/25/2018 and taken to the operating room where he underwent uncomplicated C3 laminoplasty.  Post-operatively he was placed in the ICU overnight for MAP goals of >62mHg.  On POD#1 he was transferred to the floor and mobilized with PT/OT.  His drain was removed.  He did well and was discharged to home on POD#2 with home health for PT/OT.  Consults: pulmonary/intensive care; hospitalist service  Significant Diagnostic Studies: none  Treatments: surgery: C3 LAMINOPLASTY  Discharge Exam: Blood pressure (!) 156/93, pulse 85, temperature 98.7 F (37.1 C), resp. rate 17, height 6' (1.829 m), weight 100.3 kg (221 lb 1.9 oz), SpO2 97 %. AAOX3 PERRL FACE SYMMETRIC TONGUE MIDLINE MOTOR: 5/5 INCISION C/D/I  Disposition: Discharge disposition: 01-Home or Self Care       Discharge Instructions    Call MD / Call 911   Complete by:  As directed    If you experience chest pain or shortness of breath, CALL 911 and be transported to the hospital emergency room.  If you develope a fever above 101 F, pus (white drainage) or increased drainage or redness at the wound, or calf pain, call your surgeon's office.   Driving restrictions   Complete by:  As directed    No driving for 6 weeks   Increase activity slowly as tolerated   Complete by:  As directed      Allergies as of 02/27/2018      Reactions   Amoxicillin    diarrhea and headache Has patient had a PCN reaction causing immediate rash, facial/tongue/throat swelling, SOB or lightheadedness with hypotension: No Has patient had a PCN reaction causing severe rash involving  mucus membranes or skin necrosis: No Has patient had a PCN reaction that required hospitalization: No Has patient had a PCN reaction occurring within the last 10 years: Unknown If all of the above answers are "NO", then may proceed with Cephalosporin use.   Fexofenadine    Unknown reaction      Medication List    STOP taking these medications   acetaminophen 500 MG tablet Commonly known as:  TYLENOL     TAKE these medications   aspirin 81 MG tablet Take 81 mg by mouth daily.   atorvastatin 80 MG tablet Commonly known as:  LIPITOR TAKE ONE (1) TABLET EACH DAY   blood glucose meter kit and supplies Kit Dispense based on patient and insurance preference. Check sugars once daily   CALCIUM + D PO Take 1 tablet by mouth daily.   clopidogrel 75 MG tablet Commonly known as:  PLAVIX Take 1 tablet (75 mg total) by mouth daily.   docusate sodium 100 MG capsule Commonly known as:  COLACE Take 1 capsule (100 mg total) by mouth 2 (two) times daily.   fenofibrate 145 MG tablet Commonly known as:  TRICOR Take 1 tablet (145 mg total) by mouth daily.   gabapentin 300 MG capsule Commonly known as:  NEURONTIN TAKE 1 CAPSULE(300 MG) BY MOUTH AT BEDTIME What changed:  See the new instructions.   hydrochlorothiazide 25 MG tablet Commonly known as:  HYDRODIURIL Take 1 tablet (25 mg total) by mouth daily.   HYDROcodone-acetaminophen 5-325 MG tablet  Commonly known as:  NORCO/VICODIN Every 6 hours as needed for low back pain What changed:  Another medication with the same name was added. Make sure you understand how and when to take each.   HYDROcodone-acetaminophen 5-325 MG tablet Commonly known as:  NORCO/VICODIN Take 1-2 tablets by mouth every 6 (six) hours as needed for up to 5 days for moderate pain (post-operative neck pain). What changed:  You were already taking a medication with the same name, and this prescription was added. Make sure you understand how and when to take  each.   lisinopril 5 MG tablet Commonly known as:  PRINIVIL,ZESTRIL Take 1 tablet (5 mg total) by mouth daily.   metFORMIN 1000 MG tablet Commonly known as:  GLUCOPHAGE TAKE ONE (1) TABLET BY MOUTH TWO (2) TIMES DAILY WITH FOOD   methocarbamol 500 MG tablet Commonly known as:  ROBAXIN Take 1 tablet (500 mg total) by mouth every 6 (six) hours as needed for muscle spasms.   metoprolol tartrate 50 MG tablet Commonly known as:  LOPRESSOR Take 1 tablet (50 mg total) by mouth 2 (two) times daily.   ONETOUCH VERIO test strip Generic drug:  glucose blood CHECK BLOOD SUGAR ONCE DAILY   pantoprazole 40 MG tablet Commonly known as:  PROTONIX TAKE ONE (1) TABLET BY MOUTH EVERY DAY AS NEEDED FOR HEARTBURN OR ACID REFLUX   Potassium 99 MG Tabs Take 1 tablet by mouth daily.   senna 8.6 MG Tabs tablet Commonly known as:  SENOKOT Take 1 tablet (8.6 mg total) by mouth 2 (two) times daily.   sitaGLIPtin 100 MG tablet Commonly known as:  JANUVIA Take 1 tablet (100 mg total) by mouth daily.   Vitamin D (Ergocalciferol) 50000 units Caps capsule Commonly known as:  DRISDOL Take 50,000 Units by mouth once a week.      Follow-up Information    Marin Olp, PA-C Follow up in 2 week(s).   Specialty:  Neurosurgery Why:  Appointment already made for 03/12/2018 Contact information: [place] [address] [phone]           Signed: [place] 02/27/2018, 9:27 AM

## 2018-02-27 NOTE — Progress Notes (Signed)
Pt cont to be up in chair. States he is more comfortable in chair. Call bell in reach. Reports pain level 2/10

## 2018-02-27 NOTE — Final Progress Note (Signed)
Physician Final Progress Note  Patient ID: [name] MRN: [id number] DOB/AGE: 03/27/1951 67 y.o.  Admit date: 02/25/2018 Admitting provider: Provider Not In System Discharge date: 02/27/2018   Admission Diagnoses: CERVICAL SPONDYLOSIS WITH MYELOPATHY   Discharge Diagnoses:  Active Problems:   Cervical myelopathy (HCC)    Consults: pulmonary/intensive care; HOSPITALIST SERVICE  Significant Findings/ Diagnostic Studies: NONE  Procedures: C3 LAMINOPLASTY   Discharge Condition: good  Disposition: Discharge disposition: 01-Home or Self Care       Diet: Diabetic diet  Discharge Activity: No lifting, driving, or strenuous exercise for 6 WEEKS  Discharge Instructions    Call MD / Call 911   Complete by:  As directed    If you experience chest pain or shortness of breath, CALL 911 and be transported to the hospital emergency room.  If you develope a fever above 101 F, pus (white drainage) or increased drainage or redness at the wound, or calf pain, call your surgeon's office.   Driving restrictions   Complete by:  As directed    No driving for 6 weeks   Increase activity slowly as tolerated   Complete by:  As directed      Allergies as of 02/27/2018      Reactions   Amoxicillin    diarrhea and headache Has patient had a PCN reaction causing immediate rash, facial/tongue/throat swelling, SOB or lightheadedness with hypotension: No Has patient had a PCN reaction causing severe rash involving mucus membranes or skin necrosis: No Has patient had a PCN reaction that required hospitalization: No Has patient had a PCN reaction occurring within the last 10 years: Unknown If all of the above answers are "NO", then may proceed with Cephalosporin use.   Fexofenadine    Unknown reaction      Medication List    STOP taking these medications   acetaminophen 500 MG tablet Commonly known as:  TYLENOL     TAKE these medications   aspirin 81 MG tablet Take 81 mg by mouth  daily.   atorvastatin 80 MG tablet Commonly known as:  LIPITOR TAKE ONE (1) TABLET EACH DAY   blood glucose meter kit and supplies Kit Dispense based on patient and insurance preference. Check sugars once daily   CALCIUM + D PO Take 1 tablet by mouth daily.   clopidogrel 75 MG tablet Commonly known as:  PLAVIX Take 1 tablet (75 mg total) by mouth daily.   docusate sodium 100 MG capsule Commonly known as:  COLACE Take 1 capsule (100 mg total) by mouth 2 (two) times daily.   fenofibrate 145 MG tablet Commonly known as:  TRICOR Take 1 tablet (145 mg total) by mouth daily.   gabapentin 300 MG capsule Commonly known as:  NEURONTIN TAKE 1 CAPSULE(300 MG) BY MOUTH AT BEDTIME What changed:  See the new instructions.   hydrochlorothiazide 25 MG tablet Commonly known as:  HYDRODIURIL Take 1 tablet (25 mg total) by mouth daily.   HYDROcodone-acetaminophen 5-325 MG tablet Commonly known as:  NORCO/VICODIN Every 6 hours as needed for low back pain What changed:  Another medication with the same name was added. Make sure you understand how and when to take each.   HYDROcodone-acetaminophen 5-325 MG tablet Commonly known as:  NORCO/VICODIN Take 1-2 tablets by mouth every 6 (six) hours as needed for up to 5 days for moderate pain (post-operative neck pain). What changed:  You were already taking a medication with the same name, and this prescription was added. Make  sure you understand how and when to take each.   lisinopril 5 MG tablet Commonly known as:  PRINIVIL,ZESTRIL Take 1 tablet (5 mg total) by mouth daily.   metFORMIN 1000 MG tablet Commonly known as:  GLUCOPHAGE TAKE ONE (1) TABLET BY MOUTH TWO (2) TIMES DAILY WITH FOOD   methocarbamol 500 MG tablet Commonly known as:  ROBAXIN Take 1 tablet (500 mg total) by mouth every 6 (six) hours as needed for muscle spasms.   metoprolol tartrate 50 MG tablet Commonly known as:  LOPRESSOR Take 1 tablet (50 mg total) by mouth 2  (two) times daily.   ONETOUCH VERIO test strip Generic drug:  glucose blood CHECK BLOOD SUGAR ONCE DAILY   pantoprazole 40 MG tablet Commonly known as:  PROTONIX TAKE ONE (1) TABLET BY MOUTH EVERY DAY AS NEEDED FOR HEARTBURN OR ACID REFLUX   Potassium 99 MG Tabs Take 1 tablet by mouth daily.   senna 8.6 MG Tabs tablet Commonly known as:  SENOKOT Take 1 tablet (8.6 mg total) by mouth 2 (two) times daily.   sitaGLIPtin 100 MG tablet Commonly known as:  JANUVIA Take 1 tablet (100 mg total) by mouth daily.   Vitamin D (Ergocalciferol) 50000 units Caps capsule Commonly known as:  DRISDOL Take 50,000 Units by mouth once a week.      Follow-up Information    Marin Olp, PA-C Follow up in 2 week(s).   Specialty:  Neurosurgery Why:  Appointment already made for 03/12/2018 Contact information: [place] [address] [phone]          EXAMINATION: AAOX3 PERRL FACE SYMMETRIC TONGUE MIDLINE MOTOR: 5/5 INCISION: C/D/I  A/P: POD#2 S/P C3 LAMINOPLASTY FOR CERVICAL SPONDYLOTIC MYELOPATHY 1. D/C HOME 2. HOME HEALTH FOR PT/OT ORDERED 3. D/C MEDS COMPLETED 4. F/U IN 2 WEEKS FOR STAPLE REMOVAL 5. PATIENT INSTRUCTED TO WEAR COLLAR WHEN OUT OF THE HOUSE; MAY REMOVE COLLAR WHEN INSIDE HOUSE  Total time spent taking care of this patient: 30 minutes  Signed: Shemika Robbs A Jevaeh Shams 02/27/2018, 9:23 AM

## 2018-02-27 NOTE — Care Management Note (Signed)
otCase Management Note  Patient Details  Name: [name] MRN: [id number] Date of Birth: 11-20-1950  Subjective/Objective:   Discharging today. Met with patient and his daughter, [name] at bedside. Patient will be cared for by his daughter. She will be the primary contact at [phone]. Offered list of home care providers. They prefer [facility]. Referral to [facility] with [facility] for [facility]. Patient has a walker. He is currently using his cane.  Verified address. PCP is Dr. Jaynie Crumble.                  Action/Plan: [facility] for PT/OT   Expected Discharge Date:  02/27/18               Expected Discharge Plan:  [place]  In-House Referral:     Discharge planning Services  CM Consult  Post Acute Care Choice:  Home Health Choice offered to:  Patient, Adult Children  DME Arranged:    DME Agency:     HH Arranged:  PT, OT HH Agency:  [facility]  Status of Service:  Completed, signed off  If discussed at [place] of Stay Meetings, dates discussed:    Additional Comments:  Jolly Mango, RN 02/27/2018, 10:31 AM

## 2018-02-27 NOTE — Progress Notes (Signed)
Discharge note;  Discharge instructions and prescriptions given to pt. IV removed. No questions from pt. Pt dressed. Daughter at bedside to transport pt home.

## 2018-02-28 ENCOUNTER — Encounter: Payer: Self-pay | Admitting: Neurological Surgery

## 2018-03-03 DIAGNOSIS — E785 Hyperlipidemia, unspecified: Secondary | ICD-10-CM | POA: Diagnosis not present

## 2018-03-03 DIAGNOSIS — H548 Legal blindness, as defined in USA: Secondary | ICD-10-CM | POA: Diagnosis not present

## 2018-03-03 DIAGNOSIS — F17290 Nicotine dependence, other tobacco product, uncomplicated: Secondary | ICD-10-CM | POA: Diagnosis not present

## 2018-03-03 DIAGNOSIS — I2510 Atherosclerotic heart disease of native coronary artery without angina pectoris: Secondary | ICD-10-CM | POA: Diagnosis not present

## 2018-03-03 DIAGNOSIS — M4712 Other spondylosis with myelopathy, cervical region: Secondary | ICD-10-CM | POA: Diagnosis not present

## 2018-03-03 DIAGNOSIS — Z4789 Encounter for other orthopedic aftercare: Secondary | ICD-10-CM | POA: Diagnosis not present

## 2018-03-03 DIAGNOSIS — Z7984 Long term (current) use of oral hypoglycemic drugs: Secondary | ICD-10-CM | POA: Diagnosis not present

## 2018-03-03 DIAGNOSIS — I10 Essential (primary) hypertension: Secondary | ICD-10-CM | POA: Diagnosis not present

## 2018-03-03 DIAGNOSIS — E119 Type 2 diabetes mellitus without complications: Secondary | ICD-10-CM | POA: Diagnosis not present

## 2018-03-03 DIAGNOSIS — Z955 Presence of coronary angioplasty implant and graft: Secondary | ICD-10-CM | POA: Diagnosis not present

## 2018-03-03 DIAGNOSIS — K219 Gastro-esophageal reflux disease without esophagitis: Secondary | ICD-10-CM | POA: Diagnosis not present

## 2018-03-03 DIAGNOSIS — Z7902 Long term (current) use of antithrombotics/antiplatelets: Secondary | ICD-10-CM | POA: Diagnosis not present

## 2018-03-03 DIAGNOSIS — Z7982 Long term (current) use of aspirin: Secondary | ICD-10-CM | POA: Diagnosis not present

## 2018-03-29 ENCOUNTER — Encounter: Payer: Self-pay | Admitting: Family Medicine

## 2018-03-29 ENCOUNTER — Ambulatory Visit (INDEPENDENT_AMBULATORY_CARE_PROVIDER_SITE_OTHER): Payer: PPO | Admitting: Family Medicine

## 2018-03-29 VITALS — BP 150/99 | HR 98 | Temp 97.9°F | Resp 15 | Wt 209.8 lb

## 2018-03-29 DIAGNOSIS — M47816 Spondylosis without myelopathy or radiculopathy, lumbar region: Secondary | ICD-10-CM | POA: Diagnosis not present

## 2018-03-29 DIAGNOSIS — I10 Essential (primary) hypertension: Secondary | ICD-10-CM | POA: Diagnosis not present

## 2018-03-29 DIAGNOSIS — E119 Type 2 diabetes mellitus without complications: Secondary | ICD-10-CM

## 2018-03-29 DIAGNOSIS — R300 Dysuria: Secondary | ICD-10-CM | POA: Diagnosis not present

## 2018-03-29 LAB — POCT URINALYSIS DIPSTICK
Blood, UA: NEGATIVE
GLUCOSE UA: NEGATIVE
KETONES UA: NEGATIVE
Leukocytes, UA: NEGATIVE
Nitrite, UA: NEGATIVE
Protein, UA: POSITIVE — AB
Spec Grav, UA: 1.015 (ref 1.010–1.025)
Urobilinogen, UA: 1 E.U./dL
pH, UA: 6 (ref 5.0–8.0)

## 2018-03-29 LAB — POCT GLYCOSYLATED HEMOGLOBIN (HGB A1C): Hemoglobin A1C: 7.5 % — AB (ref 4.0–5.6)

## 2018-03-29 MED ORDER — CEPHALEXIN 500 MG PO CAPS: 500.0000 mg | ORAL_CAPSULE | Freq: Two times a day (BID) | ORAL | 0 refills | Status: DC

## 2018-03-29 MED ORDER — LISINOPRIL 10 MG PO TABS: 10.0000 mg | ORAL_TABLET | Freq: Every day | ORAL | 3 refills | Status: DC

## 2018-03-29 MED ORDER — HYDROCODONE-ACETAMINOPHEN 5-325 MG PO TABS: ORAL_TABLET | ORAL | 0 refills | Status: DC

## 2018-03-29 NOTE — Patient Instructions (Addendum)
Let me know if urinary symptoms are not improving. We will recheck your blood pressure in 4 weeks.

## 2018-03-29 NOTE — Progress Notes (Signed)
  Subjective:     Patient ID: [name], male   DOB: 02-23-1951, 67 y.o.   MRN: [id number] Chief Complaint  Patient presents with  . Diabetes    Patient comes in office today for 3 month follow up, last office visit was 12/20/17 HgbA1C was 7.0%. Patient reports good compliance on Metformin and Januvia, patient denies symptoms polydipsia or polyuria. Patient denies any hyper/hypoglycemia incidents and states there has been no changes to feet for wounds or sores.   . Hypertension    3 month follow up from 12/20/17 blood pressure at visit was 144/82 patient reports good compliance and toelrance on medication  . Hyperlipidemia    3 month follow up from 2/28/9, patient reports good compliance and tolerance on medication.   . Dysuria    Patient reports burning with urination for one week.    HPI He has completed physical therapy after posterior cervical laminectomy 02/25/18. Still wearing a cervical collar and uses a cane prn. He has lifting restrictions and is not up to his usual level of physical activity. Reports burning with urination for the last two weeks. Did have a urinary catheter during his hospitalization. Accompanied by his daughter, [name].  Review of Systems  Respiratory: Negative for shortness of breath.   Cardiovascular: Negative for chest pain and palpitations.       Objective:   Physical Exam  Constitutional: He appears well-developed and well-nourished. No distress.  Cardiovascular: Normal rate and regular rhythm.  Pulmonary/Chest: Breath sounds normal.  Musculoskeletal: He exhibits no edema (pedal).       Assessment:    1. Dysuria: empirically cover for UTI/Prostatitis - POCT urinalysis dipstick - cephALEXin (KEFLEX) 500 MG capsule; Take 1 capsule (500 mg total) by mouth 2 (two) times daily.  Dispense: 14 capsule; Refill: 0  2. Essential (primary) hypertension: increase medication for improved control - lisinopril (PRINIVIL,ZESTRIL) 10 MG tablet; Take 1 tablet (10  mg total) by mouth daily.  Dispense: 90 tablet; Refill: 3  3. Controlled type 2 diabetes mellitus without complication, without long-term current use of insulin (HCC) - POCT glycosylated hemoglobin (Hb A1C)  4. Spondylosis of lumbar region without myelopathy or radiculopathy - HYDROcodone-acetaminophen (NORCO/VICODIN) 5-325 MG tablet; Every 6 hours as needed for low back pain  Dispense: 60 tablet; Refill: 0    Plan:    Recheck blood pressure in 4 weeks. He also is planning to reschedule with his cardiologist.

## 2018-04-09 DIAGNOSIS — M4712 Other spondylosis with myelopathy, cervical region: Secondary | ICD-10-CM | POA: Diagnosis not present

## 2018-04-24 DIAGNOSIS — Z955 Presence of coronary angioplasty implant and graft: Secondary | ICD-10-CM | POA: Diagnosis not present

## 2018-04-24 DIAGNOSIS — E785 Hyperlipidemia, unspecified: Secondary | ICD-10-CM | POA: Diagnosis not present

## 2018-04-24 DIAGNOSIS — Z9889 Other specified postprocedural states: Secondary | ICD-10-CM | POA: Diagnosis not present

## 2018-04-24 DIAGNOSIS — I10 Essential (primary) hypertension: Secondary | ICD-10-CM | POA: Diagnosis not present

## 2018-04-24 DIAGNOSIS — I2510 Atherosclerotic heart disease of native coronary artery without angina pectoris: Secondary | ICD-10-CM | POA: Diagnosis not present

## 2018-04-26 ENCOUNTER — Other Ambulatory Visit: Payer: Self-pay | Admitting: Family Medicine

## 2018-04-26 ENCOUNTER — Ambulatory Visit (INDEPENDENT_AMBULATORY_CARE_PROVIDER_SITE_OTHER): Payer: PPO | Admitting: Family Medicine

## 2018-04-26 ENCOUNTER — Encounter: Payer: Self-pay | Admitting: Family Medicine

## 2018-04-26 VITALS — BP 164/100 | HR 83 | Temp 97.9°F | Resp 16 | Wt 210.2 lb

## 2018-04-26 DIAGNOSIS — M47816 Spondylosis without myelopathy or radiculopathy, lumbar region: Secondary | ICD-10-CM

## 2018-04-26 DIAGNOSIS — I10 Essential (primary) hypertension: Secondary | ICD-10-CM

## 2018-04-26 MED ORDER — AMLODIPINE BESYLATE 5 MG PO TABS: 5.0000 mg | ORAL_TABLET | Freq: Every day | ORAL | 0 refills | Status: DC

## 2018-04-26 MED ORDER — HYDROCODONE-ACETAMINOPHEN 5-325 MG PO TABS: ORAL_TABLET | ORAL | 0 refills | Status: DC

## 2018-04-26 NOTE — Patient Instructions (Signed)
Let me know before your appointment if you can't tolerate the medication.

## 2018-04-26 NOTE — Progress Notes (Signed)
  Subjective:     Patient ID: [name], male   DOB: 01-Jul-1951, 67 y.o.   MRN: [id number] Chief Complaint  Patient presents with  . Hypertension    patient comes into office today for 4 week follow up, patient was last seen 03/29/18 and we increased Lisinopril to 10mg  qd. Blood pressure at last office visit was 150/99, patient reports good compliance and tolerance on medication. Patient states that he saw cardiologist on 04/24/18 and states his blood pressure was 179/98.    HPI Accompanied by daughter, Aggie CosierCrystal, she confirms compliance with medication.  Review of Systems  Respiratory: Negative for shortness of breath.   Cardiovascular: Negative for chest pain and palpitations.       Objective:   Physical Exam  Constitutional: He appears well-developed and well-nourished. No distress.  Cardiovascular: Normal rate and regular rhythm.  Pulmonary/Chest: Breath sounds normal.       Assessment:    1. Essential (primary) hypertension: add amlodipine   2. Spondylosis of lumbar region without myelopathy or radiculopathy - HYDROcodone-acetaminophen (NORCO/VICODIN) 5-325 MG tablet; Every 6 hours as needed for low back pain  Dispense: 60 tablet; Refill: 0    Plan:    May call prior to appointment if not tolerating the medication. Discussed possible side effect of leg swelling.

## 2018-05-14 ENCOUNTER — Telehealth: Payer: Self-pay | Admitting: Family Medicine

## 2018-05-14 NOTE — Telephone Encounter (Signed)
Left detailed message for daughter to call office back, you just added Amlodipine to patients medication on 04/26/18. KW

## 2018-05-14 NOTE — Telephone Encounter (Signed)
Pt's daughter Aggie CosierCrystal is requesting call back. [name] stated that she believes the new medication pt is taking for his BP is causing his legs to swell. [name] stated that she wasn't sure the name of the medication. Please advise. Thanks TNP

## 2018-05-15 NOTE — Telephone Encounter (Signed)
Discussed stopping amlodipine. Will come in for a nurse bp check in a few days. Consider increasing lisinopril.

## 2018-05-23 ENCOUNTER — Other Ambulatory Visit: Payer: Self-pay | Admitting: Family Medicine

## 2018-05-23 DIAGNOSIS — E119 Type 2 diabetes mellitus without complications: Secondary | ICD-10-CM

## 2018-05-24 ENCOUNTER — Encounter: Payer: Self-pay | Admitting: Family Medicine

## 2018-05-24 ENCOUNTER — Ambulatory Visit (INDEPENDENT_AMBULATORY_CARE_PROVIDER_SITE_OTHER): Payer: PPO | Admitting: Family Medicine

## 2018-05-24 VITALS — BP 158/89 | HR 80 | Temp 98.6°F | Resp 16 | Ht 72.0 in | Wt 211.0 lb

## 2018-05-24 DIAGNOSIS — I10 Essential (primary) hypertension: Secondary | ICD-10-CM | POA: Diagnosis not present

## 2018-05-24 DIAGNOSIS — M47816 Spondylosis without myelopathy or radiculopathy, lumbar region: Secondary | ICD-10-CM

## 2018-05-24 MED ORDER — LISINOPRIL 20 MG PO TABS: 20.0000 mg | ORAL_TABLET | Freq: Every day | ORAL | 3 refills | Status: DC

## 2018-05-24 MED ORDER — HYDROCODONE-ACETAMINOPHEN 5-325 MG PO TABS: ORAL_TABLET | ORAL | 0 refills | Status: DC

## 2018-05-24 NOTE — Progress Notes (Signed)
  Subjective:     Patient ID: [name], male   DOB: 25-Oct-1950, 67 y.o.   MRN: [id number] Chief Complaint  Patient presents with  . Hypertension    Patient comes in today for a follow up. He was discontinued off of amlodipine about 3 weeks ago due to leg swelling.    HPI States swelling went down after stopping the medication. Accompanied by his daughter, [name].  Review of Systems     Objective:   Physical Exam  Constitutional: He appears well-developed and well-nourished. No distress.  Cardiovascular: Normal rate and regular rhythm.  Pulmonary/Chest: Breath sounds normal.  Musculoskeletal: He exhibits no edema (of lower extremities).       Assessment:    1. Essential (primary) hypertension: increase lisinopril to 20 mg daily  2. Spondylosis of lumbar region without myelopathy or radiculopathy - HYDROcodone-acetaminophen (NORCO/VICODIN) 5-325 MG tablet; Every 6 hours as needed for low back pain  Dispense: 60 tablet; Refill: 0    Plan:    F/u for diabetes and hypertension in 4 weeks or sooner if not tolerating the medication.

## 2018-05-24 NOTE — Patient Instructions (Signed)
Let me know before the appointment if you can't tolerate the medication. 

## 2018-06-11 ENCOUNTER — Other Ambulatory Visit: Payer: Self-pay | Admitting: Family Medicine

## 2018-06-11 ENCOUNTER — Ambulatory Visit (INDEPENDENT_AMBULATORY_CARE_PROVIDER_SITE_OTHER): Payer: PPO | Admitting: Family Medicine

## 2018-06-11 ENCOUNTER — Encounter: Payer: Self-pay | Admitting: Family Medicine

## 2018-06-11 VITALS — BP 142/84 | HR 93 | Temp 98.4°F | Resp 16 | Wt 212.0 lb

## 2018-06-11 DIAGNOSIS — M7989 Other specified soft tissue disorders: Secondary | ICD-10-CM

## 2018-06-11 MED ORDER — FUROSEMIDE 20 MG PO TABS: ORAL_TABLET | ORAL | 0 refills | Status: DC

## 2018-06-11 NOTE — Patient Instructions (Signed)
Let me know if swelling comes back frequently or not improved with the extra fluid pill.

## 2018-06-11 NOTE — Progress Notes (Signed)
  Subjective:     Patient ID: [name], male   DOB: [birth year], 67 y.o.   MRN: [id number] Chief Complaint  Patient presents with  . Leg Swelling    Patient comes in office today with complaints of swelling in his lower right leg for the past 3 days, patient states that he has been wearing support hose and elevating  his legs.   HPI States swelling occurred on 8/18 to his knees R>L.. On 8/16 he spent time sitting in the heat watching car races and spent time outside on 8/17. He is no longer on amlodipine due to prior swelling. Swelling is not present today. He is accompanied by his daughter, [name].  Review of Systems  Respiratory: Negative for shortness of breath.        Objective:   Physical Exam  Constitutional: He appears well-developed and well-nourished. No distress.  Cardiovascular: Normal rate and regular rhythm.  Pulmonary/Chest: Breath sounds normal.  Musculoskeletal: He exhibits no edema (of lower extremities/no calf tenderness).       Assessment:    1. Leg swelling: ? Secondary to heat exposure and metoprolol - furosemide (LASIX) 20 MG tablet; One tablet daily as needed for leg swelling  Dispense: 30 tablet; Refill: 0    Plan:    Further f/u if recurrent.

## 2018-06-21 ENCOUNTER — Other Ambulatory Visit: Payer: Self-pay | Admitting: Family Medicine

## 2018-06-21 ENCOUNTER — Telehealth: Payer: Self-pay | Admitting: Family Medicine

## 2018-06-21 DIAGNOSIS — M47816 Spondylosis without myelopathy or radiculopathy, lumbar region: Secondary | ICD-10-CM

## 2018-06-21 MED ORDER — HYDROCODONE-ACETAMINOPHEN 5-325 MG PO TABS: ORAL_TABLET | ORAL | 0 refills | Status: DC

## 2018-06-21 NOTE — Telephone Encounter (Signed)
done

## 2018-06-21 NOTE — Telephone Encounter (Signed)
Please review. Thanks!  

## 2018-06-21 NOTE — Telephone Encounter (Signed)
Patient is requesting a refill on the following medication  HYDROcodone-acetaminophen (NORCO/VICODIN) 5-325 MG tablet  [place] [facility] [place].

## 2018-06-26 ENCOUNTER — Ambulatory Visit (INDEPENDENT_AMBULATORY_CARE_PROVIDER_SITE_OTHER): Payer: PPO | Admitting: Family Medicine

## 2018-06-26 ENCOUNTER — Encounter: Payer: Self-pay | Admitting: Family Medicine

## 2018-06-26 VITALS — BP 130/84 | HR 72 | Temp 97.9°F | Resp 16 | Wt 207.4 lb

## 2018-06-26 DIAGNOSIS — E119 Type 2 diabetes mellitus without complications: Secondary | ICD-10-CM

## 2018-06-26 DIAGNOSIS — Z125 Encounter for screening for malignant neoplasm of prostate: Secondary | ICD-10-CM | POA: Diagnosis not present

## 2018-06-26 DIAGNOSIS — I10 Essential (primary) hypertension: Secondary | ICD-10-CM | POA: Diagnosis not present

## 2018-06-26 DIAGNOSIS — E7800 Pure hypercholesterolemia, unspecified: Secondary | ICD-10-CM

## 2018-06-26 DIAGNOSIS — Z23 Encounter for immunization: Secondary | ICD-10-CM | POA: Diagnosis not present

## 2018-06-26 LAB — POCT GLYCOSYLATED HEMOGLOBIN (HGB A1C): Hemoglobin A1C: 7.5 % — AB (ref 4.0–5.6)

## 2018-06-26 MED ORDER — GLIPIZIDE 5 MG PO TABS: 5.0000 mg | ORAL_TABLET | Freq: Every day | ORAL | 0 refills | Status: DC

## 2018-06-26 NOTE — Patient Instructions (Signed)
We will call you with the lab results. 

## 2018-06-26 NOTE — Progress Notes (Signed)
  Subjective:     Patient ID: [name], male   DOB: [birth year], 67 y.o.   MRN: [id number] Chief Complaint  Patient presents with  . Diabetes    Patient returns to office today for 3 month follow up form 12/20/17 at last visit patient was encouraged to continue Metformin and Januvia. Patient reports good compliance and tolerance on medication. Patient states that fasting blood sugars at home are 200 and greater, patient denies any hyperglycemia incidents and denies symptoms of polydipsia or polyuria. Patients last HgbA1c was 7.5% on 03/27/18.   [place] Kitchen Hypertension    Patient returns for one month follow up from 05/24/18, at last visit blood pressure was 158/89. We increased Lisinopril to 20mg  and patient reports good compliance on medication.    HPI Reports using the furosemide on a few occasions for leg swelling. Accompanied by his daughter, [name].  Review of Systems     Objective:   Physical Exam  Constitutional: He appears well-developed and well-nourished. No distress.   Lungs: clear Heart: RRR without murmur Lower extremities: no edema; pedal pulses intact, sensation to monofilament intact, no wounds noted.     Assessment:    1. Controlled type 2 diabetes mellitus without complication, without long-term current use of insulin (HCC): start glipizide with main meal of the day - POCT glycosylated hemoglobin (Hb A1C)  2. Need for influenza vaccination - Flu vaccine HIGH DOSE PF  3. Hypercholesterolemia - Lipid panel  4. Essential hypertension - Comprehensive metabolic panel  5. Screening for prostate cancer - PSA    Plan:    Further f/u pending lab work.

## 2018-06-27 LAB — COMPREHENSIVE METABOLIC PANEL
A/G RATIO: 1.9 (ref 1.2–2.2)
ALBUMIN: 4.9 g/dL — AB (ref 3.6–4.8)
ALT: 19 IU/L (ref 0–44)
AST: 13 IU/L (ref 0–40)
Alkaline Phosphatase: 33 IU/L — ABNORMAL LOW (ref 39–117)
BILIRUBIN TOTAL: 0.5 mg/dL (ref 0.0–1.2)
BUN / CREAT RATIO: 19 (ref 10–24)
BUN: 20 mg/dL (ref 8–27)
CHLORIDE: 97 mmol/L (ref 96–106)
CO2: 25 mmol/L (ref 20–29)
Calcium: 10.7 mg/dL — ABNORMAL HIGH (ref 8.6–10.2)
Creatinine, Ser: 1.07 mg/dL (ref 0.76–1.27)
GFR calc Af Amer: 82 mL/min/{1.73_m2} (ref >59)
GFR calc non Af Amer: 71 mL/min/{1.73_m2} (ref >59)
GLUCOSE: 210 mg/dL — AB (ref 65–99)
Globulin, Total: 2.6 g/dL (ref 1.5–4.5)
Potassium: 3.4 mmol/L — ABNORMAL LOW (ref 3.5–5.2)
Sodium: 141 mmol/L (ref 134–144)
TOTAL PROTEIN: 7.5 g/dL (ref 6.0–8.5)

## 2018-06-27 LAB — LIPID PANEL
CHOLESTEROL TOTAL: 185 mg/dL (ref 100–199)
Chol/HDL Ratio: 8 ratio — ABNORMAL HIGH (ref 0.0–5.0)
HDL: 23 mg/dL — AB (ref >39)
LDL Calculated: 119 mg/dL — ABNORMAL HIGH (ref 0–99)
Triglycerides: 216 mg/dL — ABNORMAL HIGH (ref 0–149)
VLDL CHOLESTEROL CAL: 43 mg/dL — AB (ref 5–40)

## 2018-06-27 LAB — PSA: PROSTATE SPECIFIC AG, SERUM: 1 ng/mL (ref 0.0–4.0)

## 2018-07-09 DIAGNOSIS — M4712 Other spondylosis with myelopathy, cervical region: Secondary | ICD-10-CM | POA: Diagnosis not present

## 2018-07-09 DIAGNOSIS — Z981 Arthrodesis status: Secondary | ICD-10-CM | POA: Diagnosis not present

## 2018-07-23 ENCOUNTER — Telehealth: Payer: Self-pay | Admitting: Family Medicine

## 2018-07-23 ENCOUNTER — Other Ambulatory Visit: Payer: Self-pay | Admitting: Family Medicine

## 2018-07-23 DIAGNOSIS — M47816 Spondylosis without myelopathy or radiculopathy, lumbar region: Secondary | ICD-10-CM

## 2018-07-23 MED ORDER — HYDROCODONE-ACETAMINOPHEN 5-325 MG PO TABS: ORAL_TABLET | ORAL | 0 refills | Status: DC

## 2018-07-23 NOTE — Telephone Encounter (Signed)
done

## 2018-07-23 NOTE — Telephone Encounter (Signed)
Please review. KW 

## 2018-07-23 NOTE — Telephone Encounter (Signed)
Pt needs refill on   Hydrocodone 5 -325  [facility]  [employer]

## 2018-07-31 ENCOUNTER — Other Ambulatory Visit: Payer: Self-pay | Admitting: Family Medicine

## 2018-07-31 DIAGNOSIS — E785 Hyperlipidemia, unspecified: Secondary | ICD-10-CM | POA: Diagnosis not present

## 2018-07-31 DIAGNOSIS — I2510 Atherosclerotic heart disease of native coronary artery without angina pectoris: Secondary | ICD-10-CM | POA: Diagnosis not present

## 2018-07-31 DIAGNOSIS — I10 Essential (primary) hypertension: Secondary | ICD-10-CM | POA: Diagnosis not present

## 2018-07-31 DIAGNOSIS — Z9889 Other specified postprocedural states: Secondary | ICD-10-CM | POA: Diagnosis not present

## 2018-07-31 DIAGNOSIS — Z955 Presence of coronary angioplasty implant and graft: Secondary | ICD-10-CM | POA: Diagnosis not present

## 2018-07-31 DIAGNOSIS — E119 Type 2 diabetes mellitus without complications: Secondary | ICD-10-CM | POA: Diagnosis not present

## 2018-08-26 ENCOUNTER — Telehealth: Payer: Self-pay | Admitting: Family Medicine

## 2018-08-26 NOTE — Telephone Encounter (Signed)
Pt's daughter requesting refill of pt's HYDROcodone-acetaminophen (NORCO/VICODIN) 5-325 MG tablet sent to  [facility] in [place]

## 2018-08-27 ENCOUNTER — Other Ambulatory Visit: Payer: Self-pay | Admitting: Family Medicine

## 2018-08-27 DIAGNOSIS — M47816 Spondylosis without myelopathy or radiculopathy, lumbar region: Secondary | ICD-10-CM

## 2018-08-27 MED ORDER — HYDROCODONE-ACETAMINOPHEN 5-325 MG PO TABS: ORAL_TABLET | ORAL | 0 refills | Status: DC

## 2018-08-27 NOTE — Telephone Encounter (Signed)
done

## 2018-08-27 NOTE — Telephone Encounter (Signed)
Last filled 07/23/18. KW

## 2018-09-25 ENCOUNTER — Encounter: Payer: Self-pay | Admitting: Family Medicine

## 2018-09-25 ENCOUNTER — Other Ambulatory Visit: Payer: Self-pay | Admitting: Family Medicine

## 2018-09-25 ENCOUNTER — Ambulatory Visit (INDEPENDENT_AMBULATORY_CARE_PROVIDER_SITE_OTHER): Payer: PPO | Admitting: Family Medicine

## 2018-09-25 VITALS — BP 154/80 | HR 68 | Temp 97.7°F | Resp 15 | Wt 209.8 lb

## 2018-09-25 DIAGNOSIS — I10 Essential (primary) hypertension: Secondary | ICD-10-CM

## 2018-09-25 DIAGNOSIS — E119 Type 2 diabetes mellitus without complications: Secondary | ICD-10-CM | POA: Diagnosis not present

## 2018-09-25 DIAGNOSIS — M47816 Spondylosis without myelopathy or radiculopathy, lumbar region: Secondary | ICD-10-CM

## 2018-09-25 LAB — POCT GLYCOSYLATED HEMOGLOBIN (HGB A1C): Hemoglobin A1C: 6.7 % — AB (ref 4.0–5.6)

## 2018-09-25 MED ORDER — HYDROCODONE-ACETAMINOPHEN 5-325 MG PO TABS: ORAL_TABLET | ORAL | 0 refills | Status: DC

## 2018-09-25 NOTE — Progress Notes (Signed)
  Subjective:     Patient ID: [name], male   DOB: [birth year], 67 y.o.   MRN: [id number] Chief Complaint  Patient presents with  . Diabetes    Patient returns to office today for 3 month follow up, at last visit 06/26/18 we started patient on Glipizide. HgbA1C at last visit was 7.5%, patient reports good compliance and symptom control on medication. Patient denies polydipsia or polyuria and has had no visual changes.  . Hyperlipidemia    Patient returns for 3 month follow up, he states that he has had good compliance on medication and is following a low salt diet.  . Hypertension    Patient returns for 3 month follow up blood pressure at last visit was 130/84 patient reports good compliance and tolerance on medication.    HPI States he is being more active and improving his food choices. Followed up with cardiology in October with no changes in medication Accompanied by his daughter, [name].  Review of Systems     Objective:   Physical Exam  Constitutional: He appears well-developed and well-nourished. No distress.  Cardiovascular: Normal rate and regular rhythm.  Pulmonary/Chest: Breath sounds normal.  Musculoskeletal: He exhibits no edema (distal lower extremities).       Assessment:    1. Controlled type 2 diabetes mellitus without complication, without long-term current use of insulin (HCC) - POCT glycosylated hemoglobin (Hb A1C)  2. Essential (primary) hypertension: per cardiology     Plan:    F/u with cardiology as scheduled. Update eye exam. Continue to stay active.

## 2018-09-25 NOTE — Patient Instructions (Signed)
Do follow up with cardiology as scheduled. Update your eye appointment. Stay active.

## 2018-10-24 ENCOUNTER — Telehealth: Payer: Self-pay

## 2018-10-24 ENCOUNTER — Other Ambulatory Visit: Payer: Self-pay | Admitting: Family Medicine

## 2018-10-24 DIAGNOSIS — M47816 Spondylosis without myelopathy or radiculopathy, lumbar region: Secondary | ICD-10-CM

## 2018-10-24 MED ORDER — HYDROCODONE-ACETAMINOPHEN 5-325 MG PO TABS: ORAL_TABLET | ORAL | 0 refills | Status: DC

## 2018-10-24 NOTE — Telephone Encounter (Signed)
done

## 2018-10-24 NOTE — Telephone Encounter (Signed)
Patient's daughter Aggie Cosier is requesting a refill on HYDROcodone-acetaminophen (NORCO/VICODIN) 5-325 MG tablet been sent to [facility]. CB# [phone]

## 2018-11-13 DIAGNOSIS — R200 Anesthesia of skin: Secondary | ICD-10-CM | POA: Diagnosis not present

## 2018-11-13 DIAGNOSIS — R202 Paresthesia of skin: Secondary | ICD-10-CM | POA: Diagnosis not present

## 2018-11-21 ENCOUNTER — Telehealth: Payer: Self-pay

## 2018-11-21 NOTE — Telephone Encounter (Signed)
Patient's daughter [name] returning Bob's call.

## 2018-11-25 ENCOUNTER — Other Ambulatory Visit: Payer: Self-pay | Admitting: Family Medicine

## 2018-11-25 DIAGNOSIS — M47816 Spondylosis without myelopathy or radiculopathy, lumbar region: Secondary | ICD-10-CM

## 2018-11-25 MED ORDER — HYDROCODONE-ACETAMINOPHEN 5-325 MG PO TABS: ORAL_TABLET | ORAL | 0 refills | Status: DC

## 2018-11-25 NOTE — Telephone Encounter (Signed)
Pt's daughter calling to request a refill on:  HYDROcodone-acetaminophen (NORCO/VICODIN) 5-325 MG tablet  Please fill at:  [id number] - [place], [place] - [address] AT [facility] OF [address] [phone] (Phone) [phone] (Fax)   Thanks, [employer]

## 2018-11-28 ENCOUNTER — Other Ambulatory Visit: Payer: Self-pay | Admitting: Family Medicine

## 2018-11-28 DIAGNOSIS — Z955 Presence of coronary angioplasty implant and graft: Secondary | ICD-10-CM | POA: Diagnosis not present

## 2018-11-28 DIAGNOSIS — I10 Essential (primary) hypertension: Secondary | ICD-10-CM | POA: Diagnosis not present

## 2018-11-28 DIAGNOSIS — I2510 Atherosclerotic heart disease of native coronary artery without angina pectoris: Secondary | ICD-10-CM | POA: Diagnosis not present

## 2018-12-03 ENCOUNTER — Telehealth: Payer: Self-pay | Admitting: Family Medicine

## 2018-12-03 NOTE — Telephone Encounter (Signed)
Waiting for fax transmission from pharmacy for preauthorization. KW

## 2018-12-03 NOTE — Telephone Encounter (Signed)
Pt's daughter called saying the pharmacy has called and waiting on the PA for Amlodipine Besylate 5mg   [facility] in [place]  Pt's CB#  [phone]  Thanks Barth Kirks

## 2018-12-09 ENCOUNTER — Ambulatory Visit (INDEPENDENT_AMBULATORY_CARE_PROVIDER_SITE_OTHER): Payer: PPO | Admitting: Family Medicine

## 2018-12-09 ENCOUNTER — Encounter: Payer: Self-pay | Admitting: Family Medicine

## 2018-12-09 VITALS — BP 138/90 | HR 65 | Temp 97.6°F | Resp 15 | Wt 216.6 lb

## 2018-12-09 DIAGNOSIS — I10 Essential (primary) hypertension: Secondary | ICD-10-CM | POA: Diagnosis not present

## 2018-12-09 DIAGNOSIS — E119 Type 2 diabetes mellitus without complications: Secondary | ICD-10-CM | POA: Diagnosis not present

## 2018-12-09 MED ORDER — AMLODIPINE BESYLATE 5 MG PO TABS: 5.0000 mg | ORAL_TABLET | Freq: Every day | ORAL | 3 refills | Status: DC

## 2018-12-09 NOTE — Progress Notes (Signed)
  Subjective:     Patient ID: [name], male   DOB: Mar 16, 1951, 68 y.o.   MRN: [id number] Chief Complaint  Patient presents with  . Diabetes    Patient returns to office today for follow up,patient was last seen in office on 09/25/18 and HgbA1C was 6.7%. Patient reports good compliance and symptom control on medication. Patient denies symptoms of visual changes, polydipsia or polyuria.   . Hypertension    Patient returns to office today for follow up from 09/25/18 blood pressure in office was 138/90. Patient reports that he had saw cardiologist on 11/28/18 to follow up for HTN.    HPI He is here early for his 3 month f/u due to my retirement. Needs clarification on amlodipine use which we d/ced last summer due to leg swelling. He wishes to get back on the medication due to recent cardiology visit with mild elevated bp. Accompanied by his daughter, [name].  Review of Systems     Objective:   Physical Exam Constitutional:      General: He is not in acute distress. Cardiovascular:     Rate and Rhythm: Normal rate and regular rhythm.  Pulmonary:     Breath sounds: Normal breath sounds.  Musculoskeletal:     Right lower leg: No edema.     Left lower leg: No edema.  Neurological:     Mental Status: He is alert.        Assessment:    1. Controlled type 2 diabetes mellitus without complication, without long-term current use of insulin (HCC): will defer A1C to next visit as he has been stable.  2. Essential (primary) hypertension: resume amlodipine - amLODipine (NORVASC) 5 MG tablet; Take 1 tablet (5 mg total) by mouth daily.  Dispense: 90 tablet; Refill: 3    Plan:    Will notify us for significant leg swelling. Will establish with Dr. Leonard Schwartz.

## 2018-12-09 NOTE — Patient Instructions (Signed)
We will schedule you with Dr. Leonard Schwartz. In 3 months. If you get too much leg swelling let us know.

## 2018-12-09 NOTE — Telephone Encounter (Signed)
spoke with pharmacist pre-authorization not required patient just need to have follow up with PCP to get Rx refill authorized. KW

## 2018-12-11 ENCOUNTER — Other Ambulatory Visit: Payer: Self-pay | Admitting: Family Medicine

## 2018-12-11 DIAGNOSIS — I2510 Atherosclerotic heart disease of native coronary artery without angina pectoris: Secondary | ICD-10-CM

## 2018-12-11 DIAGNOSIS — I10 Essential (primary) hypertension: Secondary | ICD-10-CM

## 2018-12-11 DIAGNOSIS — E785 Hyperlipidemia, unspecified: Secondary | ICD-10-CM

## 2018-12-11 DIAGNOSIS — E1169 Type 2 diabetes mellitus with other specified complication: Secondary | ICD-10-CM

## 2018-12-11 DIAGNOSIS — E119 Type 2 diabetes mellitus without complications: Secondary | ICD-10-CM

## 2018-12-11 DIAGNOSIS — K219 Gastro-esophageal reflux disease without esophagitis: Secondary | ICD-10-CM

## 2018-12-11 MED ORDER — FENOFIBRATE 145 MG PO TABS: 145.0000 mg | ORAL_TABLET | Freq: Every day | ORAL | 3 refills | Status: DC

## 2018-12-11 MED ORDER — METOPROLOL TARTRATE 50 MG PO TABS: 50.0000 mg | ORAL_TABLET | Freq: Two times a day (BID) | ORAL | 3 refills | Status: DC

## 2018-12-11 MED ORDER — PANTOPRAZOLE SODIUM 40 MG PO TBEC: DELAYED_RELEASE_TABLET | ORAL | 3 refills | Status: DC

## 2018-12-11 MED ORDER — CLOPIDOGREL BISULFATE 75 MG PO TABS: 75.0000 mg | ORAL_TABLET | Freq: Every day | ORAL | 3 refills | Status: DC

## 2018-12-11 MED ORDER — HYDROCHLOROTHIAZIDE 25 MG PO TABS: 25.0000 mg | ORAL_TABLET | Freq: Every day | ORAL | 3 refills | Status: DC

## 2018-12-11 MED ORDER — ATORVASTATIN CALCIUM 80 MG PO TABS: ORAL_TABLET | ORAL | 3 refills | Status: DC

## 2018-12-11 MED ORDER — METFORMIN HCL 1000 MG PO TABS: ORAL_TABLET | ORAL | 3 refills | Status: DC

## 2018-12-11 MED ORDER — SITAGLIPTIN PHOSPHATE 100 MG PO TABS: 100.0000 mg | ORAL_TABLET | Freq: Every day | ORAL | 3 refills | Status: DC

## 2018-12-19 ENCOUNTER — Other Ambulatory Visit: Payer: Self-pay | Admitting: Family Medicine

## 2018-12-19 ENCOUNTER — Telehealth: Payer: Self-pay | Admitting: Family Medicine

## 2018-12-19 DIAGNOSIS — M47816 Spondylosis without myelopathy or radiculopathy, lumbar region: Secondary | ICD-10-CM

## 2018-12-19 MED ORDER — HYDROCODONE-ACETAMINOPHEN 5-325 MG PO TABS: ORAL_TABLET | ORAL | 0 refills | Status: DC

## 2018-12-19 NOTE — Telephone Encounter (Signed)
Pt needing a refill on: °HYDROcodone-acetaminophen (NORCO/VICODIN) 5-325 MG tablet ° °Please fill at: ° °[id number] - [place], [place] - [address] [phone] (Phone) °[phone] (Fax)  ° °Thanks, °[facility] °

## 2018-12-19 NOTE — Telephone Encounter (Signed)
As discussed in last office visit, please review. KW

## 2018-12-25 ENCOUNTER — Ambulatory Visit: Payer: Self-pay | Admitting: Family Medicine

## 2019-01-23 ENCOUNTER — Other Ambulatory Visit: Payer: Self-pay

## 2019-01-23 DIAGNOSIS — M47816 Spondylosis without myelopathy or radiculopathy, lumbar region: Secondary | ICD-10-CM

## 2019-01-23 NOTE — Telephone Encounter (Signed)
Bob's old patient L.O.V. 12/09/2018, please advise.

## 2019-01-24 MED ORDER — HYDROCODONE-ACETAMINOPHEN 5-325 MG PO TABS: ORAL_TABLET | ORAL | 0 refills | Status: DC

## 2019-01-27 ENCOUNTER — Telehealth (INDEPENDENT_AMBULATORY_CARE_PROVIDER_SITE_OTHER): Payer: PPO | Admitting: *Deleted

## 2019-01-27 DIAGNOSIS — N183 Chronic kidney disease, stage 3 unspecified: Secondary | ICD-10-CM | POA: Diagnosis not present

## 2019-01-27 DIAGNOSIS — Z6832 Body mass index (BMI) 32.0-32.9, adult: Secondary | ICD-10-CM | POA: Diagnosis not present

## 2019-01-27 DIAGNOSIS — E119 Type 2 diabetes mellitus without complications: Secondary | ICD-10-CM | POA: Diagnosis not present

## 2019-01-27 DIAGNOSIS — I129 Hypertensive chronic kidney disease with stage 1 through stage 4 chronic kidney disease, or unspecified chronic kidney disease: Secondary | ICD-10-CM | POA: Diagnosis not present

## 2019-01-27 LAB — POCT GLYCOSYLATED HEMOGLOBIN (HGB A1C): Hemoglobin A1C: 8.2 % — AB (ref 4.0–5.6)

## 2019-01-27 NOTE — Telephone Encounter (Signed)
Patient came by to get A1C done today. Daughter will call back in the morning to request a e visit with Dr. Beryle Flock on Wednesday. Patient preferred to wait until Wednesday.

## 2019-01-27 NOTE — Telephone Encounter (Signed)
Patient's daughter [name] called concerning pt's blood sugar being elevated. [name] states pt's fasting bs has been running 240, 260, and 298 for the past 1 1/2 weeks. Patient has been having dizziness and weakness. [name] is willing to do a Webex visit or a telephone call. Please advise?

## 2019-01-27 NOTE — Telephone Encounter (Signed)
Ok to schedule a webex visit.  Will also need an a1c before or after visit.

## 2019-01-29 ENCOUNTER — Encounter: Payer: Self-pay | Admitting: Family Medicine

## 2019-01-29 ENCOUNTER — Ambulatory Visit (INDEPENDENT_AMBULATORY_CARE_PROVIDER_SITE_OTHER): Payer: PPO | Admitting: Family Medicine

## 2019-01-29 ENCOUNTER — Telehealth: Payer: Self-pay | Admitting: Family Medicine

## 2019-01-29 ENCOUNTER — Other Ambulatory Visit: Payer: Self-pay

## 2019-01-29 DIAGNOSIS — I10 Essential (primary) hypertension: Secondary | ICD-10-CM | POA: Diagnosis not present

## 2019-01-29 DIAGNOSIS — I2510 Atherosclerotic heart disease of native coronary artery without angina pectoris: Secondary | ICD-10-CM

## 2019-01-29 DIAGNOSIS — E785 Hyperlipidemia, unspecified: Secondary | ICD-10-CM | POA: Diagnosis not present

## 2019-01-29 DIAGNOSIS — E1169 Type 2 diabetes mellitus with other specified complication: Secondary | ICD-10-CM

## 2019-01-29 NOTE — Telephone Encounter (Signed)
E-visit rescheduled for 2:20 today

## 2019-01-29 NOTE — Telephone Encounter (Signed)
Pt's daughter called saying she mixed a WEB Ex appt that was scheduled for her dad this morning at 9:00.  She wanted to know if Dr. B or her nurse could give a call back  CB#  [phone]  Thanks  Barth Kirks

## 2019-01-29 NOTE — Progress Notes (Addendum)
Patient: [name] Male    DOB: Sep 03, 1951   68 y.o.   MRN: [id number] Visit Date: 01/30/2019  Today's Provider: Lavon Paganini, MD   Chief Complaint  Patient presents with  . Diabetes   Subjective:    Virtual Visit via Video Note  I connected with [name] on 01/30/19 at 2:20 PM EDT by a video enabled telemedicine application and verified that I am speaking with the correct person using two identifiers.   I discussed the limitations of evaluation and management by telemedicine and the availability of in person appointments. The patient expressed understanding and agreed to proceed.   Patient location: home Provider location: [facility] Persons involved in the visit: patient, provider    HPI  Diabetes Mellitus Type II, Follow-up:   Lab Results  Component Value Date   HGBA1C 8.2 (A) 01/27/2019   HGBA1C 6.7 (A) 09/25/2018   HGBA1C 7.5 (A) 06/26/2018    Last seen for diabetes 1 months ago.  Management since then includes no changes. He reports good compliance with treatment. He is not having side effects.  Current symptoms include hyperglycemia  Home blood sugar records: fasting range:  240-298  Episodes of hypoglycemia? no   Current Insulin Regimen: none Most Recent Eye Exam: not UTD Weight trend: stable Current exercise: walking Current diet habits: well balanced  Pertinent Labs:    Component Value Date/Time   CHOL 185 06/26/2018 0851   TRIG 216 (H) 06/26/2018 0851   HDL 23 (L) 06/26/2018 0851   LDLCALC 119 (H) 06/26/2018 0851   CREATININE 1.07 06/26/2018 0851    Wt Readings from Last 3 Encounters:  12/09/18 216 lb 9.6 oz (98.2 kg)  09/25/18 209 lb 12.8 oz (95.2 kg)  06/26/18 207 lb 6.4 oz (94.1 kg)   - Checking BG at home: yes, running in the 200s - Medications: Metformin 1052m BID, glipizide 547mdaily, Januvia 10082maily - Compliance: good - daughter manages his medications - eye exam: needs - foot exam: needs  - microalbumin: n/a ACEi - denies symptoms of hypoglycemia, polyuria, polydipsia, numbness extremities, foot ulcers/trauma  ------------------------------------------------------------------------  Allergies  Allergen Reactions  . Amoxicillin     diarrhea and headache Has patient had a PCN reaction causing immediate rash, facial/tongue/throat swelling, SOB or lightheadedness with hypotension: No Has patient had a PCN reaction causing severe rash involving mucus membranes or skin necrosis: No Has patient had a PCN reaction that required hospitalization: No Has patient had a PCN reaction occurring within the last 10 years: Unknown If all of the above answers are "NO", then may proceed with Cephalosporin use.   . FMarland Kitchenxofenadine     Unknown reaction      Current Outpatient Medications:  .  amLODipine (NORVASC) 5 MG tablet, Take 1 tablet (5 mg total) by mouth daily., Disp: 90 tablet, Rfl: 3 .  aspirin 81 MG tablet, Take 81 mg by mouth daily. , Disp: , Rfl:  .  atorvastatin (LIPITOR) 80 MG tablet, TAKE ONE (1) TABLET EACH DAY, Disp: 90 tablet, Rfl: 3 .  blood glucose meter kit and supplies KIT, Dispense based on patient and insurance preference. Check sugars once daily, Disp: 1 each, Rfl: 0 .  Calcium Citrate-Vitamin D (CALCIUM + D PO), Take 1 tablet by mouth daily., Disp: , Rfl:  .  clopidogrel (PLAVIX) 75 MG tablet, Take 1 tablet (75 mg total) by mouth daily., Disp: 90 tablet, Rfl: 3 .  docusate sodium (COLACE) 100  MG capsule, Take 1 capsule (100 mg total) by mouth 2 (two) times daily., Disp: 10 capsule, Rfl: 0 .  fenofibrate (TRICOR) 145 MG tablet, Take 1 tablet (145 mg total) by mouth daily., Disp: 90 tablet, Rfl: 3 .  hydrochlorothiazide (HYDRODIURIL) 25 MG tablet, Take 1 tablet (25 mg total) by mouth daily., Disp: 90 tablet, Rfl: 3 .  HYDROcodone-acetaminophen (NORCO/VICODIN) 5-325 MG tablet, Every 6 hours as needed for low back pain, Disp: 60 tablet, Rfl: 0 .  lisinopril  (PRINIVIL,ZESTRIL) 20 MG tablet, Take 1 tablet (20 mg total) by mouth daily., Disp: 90 tablet, Rfl: 3 .  metFORMIN (GLUCOPHAGE) 1000 MG tablet, TAKE ONE (1) TABLET BY MOUTH TWO (2) TIMES DAILY WITH FOOD, Disp: 180 tablet, Rfl: 3 .  metoprolol tartrate (LOPRESSOR) 50 MG tablet, Take 1 tablet (50 mg total) by mouth 2 (two) times daily., Disp: 180 tablet, Rfl: 3 .  ONETOUCH VERIO test strip, TEST BLOOD GLUCOSE EVERY DAY, Disp: 50 each, Rfl: 5 .  pantoprazole (PROTONIX) 40 MG tablet, TAKE ONE (1) TABLET BY MOUTH EVERY DAY AS NEEDED FOR HEARTBURN OR ACID REFLUX, Disp: 90 tablet, Rfl: 3 .  pregabalin (LYRICA) 50 MG capsule, TK 2 CS PO BID FOR 30 DAYS, Disp: , Rfl:  .  senna (SENOKOT) 8.6 MG TABS tablet, Take 1 tablet (8.6 mg total) by mouth 2 (two) times daily., Disp: 10 each, Rfl: 0 .  sitaGLIPtin (JANUVIA) 100 MG tablet, Take 1 tablet (100 mg total) by mouth daily., Disp: 90 tablet, Rfl: 3 .  empagliflozin (JARDIANCE) 10 MG TABS tablet, Take 10 mg by mouth daily., Disp: 30 tablet, Rfl: 1  Review of Systems  Constitutional: Negative.   Respiratory: Negative.   Cardiovascular: Negative.   Musculoskeletal: Negative.   Psychiatric/Behavioral: Negative.     Social History   Tobacco Use  . Smoking status: Current Every Day Smoker    Types: Cigars  . Smokeless tobacco: Never Used  . Tobacco comment: smoker, Has been smoking for 12 years, smokes cigars; smokesabout 10-15 cigars a week(varies)  Substance Use Topics  . Alcohol use: No      Objective:   There were no vitals taken for this visit. There were no vitals filed for this visit.   Physical Exam Constitutional:      Appearance: Normal appearance.  HENT:     Head: Normocephalic and atraumatic.  Eyes:     General: No scleral icterus.    Conjunctiva/sclera: Conjunctivae normal.  Neurological:     Mental Status: He is alert.     Results for orders placed or performed in visit on 01/27/19  POCT HgB A1C  Result Value Ref Range    Hemoglobin A1C 8.2 (A) 4.0 - 5.6 %   HbA1c POC (<> result, manual entry)     HbA1c, POC (prediabetic range)     HbA1c, POC (controlled diabetic range)     (Patient drove up and A1c was taken in the car prior to visit)    Assessment & Plan    I discussed the assessment and treatment plan with the patient. The patient was provided an opportunity to ask questions and all were answered. The patient agreed with the plan and demonstrated an understanding of the instructions.   The patient was advised to call back or seek an in-person evaluation if the symptoms worsen or if the condition fails to improve as anticipated.  Problem List Items Addressed This Visit      Endocrine   T2DM (type 2 diabetes  mellitus) ([place]) - Primary    With CAD, HLD, and HTN Uncontrolled With A1c elevating from 6.7 to 8.2 in the last 4 months Seems glipizide is no longer effective due to burnout Discussed the risks of glipizide in elderly patients Will stop glipizide Will start jardiance 58m daily After 1 month, if toleraitng it well will increase to 282mdaily Will need foot exam and eye exam when pandemic is over UTD on vaccines On ACEi and statin      Relevant Medications   empagliflozin (JARDIANCE) 10 MG TABS tablet       Return in about 4 weeks (around 02/26/2019) for DM f/u.   The entirety of the information documented in the History of Present Illness, Review of Systems and Physical Exam were personally obtained by me. Portions of this information were initially documented by NiTiburcio PeaCMA and reviewed by me for thoroughness and accuracy.    BaVirginia CrewsMD, MPH [facility]/06/2019 8:16 AM

## 2019-01-30 ENCOUNTER — Encounter: Payer: Self-pay | Admitting: Family Medicine

## 2019-01-30 MED ORDER — EMPAGLIFLOZIN 10 MG PO TABS: 10.0000 mg | ORAL_TABLET | Freq: Every day | ORAL | 1 refills | Status: DC

## 2019-01-30 NOTE — Assessment & Plan Note (Signed)
With CAD, HLD, and HTN Uncontrolled With A1c elevating from 6.7 to 8.2 in the last 4 months Seems glipizide is no longer effective due to burnout Discussed the risks of glipizide in elderly patients Will stop glipizide Will start jardiance 10mg  daily After 1 month, if toleraitng it well will increase to 25mg  daily Will need foot exam and eye exam when pandemic is over UTD on vaccines On ACEi and statin

## 2019-02-25 ENCOUNTER — Other Ambulatory Visit: Payer: Self-pay

## 2019-02-25 DIAGNOSIS — M47816 Spondylosis without myelopathy or radiculopathy, lumbar region: Secondary | ICD-10-CM

## 2019-02-25 NOTE — Telephone Encounter (Signed)
Patient's daughter request refill.

## 2019-02-26 MED ORDER — HYDROCODONE-ACETAMINOPHEN 5-325 MG PO TABS: ORAL_TABLET | ORAL | 0 refills | Status: DC

## 2019-03-10 ENCOUNTER — Encounter: Payer: Self-pay | Admitting: Family Medicine

## 2019-03-10 ENCOUNTER — Ambulatory Visit (INDEPENDENT_AMBULATORY_CARE_PROVIDER_SITE_OTHER): Payer: PPO | Admitting: Family Medicine

## 2019-03-10 DIAGNOSIS — F5101 Primary insomnia: Secondary | ICD-10-CM

## 2019-03-10 DIAGNOSIS — E1169 Type 2 diabetes mellitus with other specified complication: Secondary | ICD-10-CM

## 2019-03-10 MED ORDER — EMPAGLIFLOZIN 25 MG PO TABS: 25.0000 mg | ORAL_TABLET | Freq: Every day | ORAL | 3 refills | Status: DC

## 2019-03-10 MED ORDER — TRAZODONE HCL 50 MG PO TABS: 25.0000 mg | ORAL_TABLET | Freq: Every evening | ORAL | 3 refills | Status: DC | PRN

## 2019-03-10 NOTE — Assessment & Plan Note (Signed)
With CAD, HTN, HLD Uncontrolled on last check BGs improving since starting jardiance Will increase jardiance to 25mg  daily Continue metformin and januvia D/c'd glipizide at last visit No hypoglycemia Needs foot exam and eye exam post-pandemic UTD on vaccines On ACEi and statin

## 2019-03-10 NOTE — Assessment & Plan Note (Signed)
Chronic and uncontrolled Unsure of previous therapy and do not see anything on chart review Will try trazodone At f/u, consider dose titration Could also consider nortriptyline in the future as it would help with chronic pain as well

## 2019-03-10 NOTE — Progress Notes (Signed)
Patient: [name] Male    DOB: 1951/07/16   68 y.o.   MRN: [id number] Visit Date: 03/10/2019  Today's Provider: Lavon Paganini, MD   Chief Complaint  Patient presents with  . Diabetes   Subjective:    Virtual Visit via Video Note  I connected with [name] on 03/10/19 at 11:20 AM EDT by a video enabled telemedicine application and verified that I am speaking with the correct person using two identifiers.   Patient location: home Provider location: home office Persons involved in the visit: patient, provider, patient's daughter   I discussed the limitations of evaluation and management by telemedicine and the availability of in person appointments. The patient expressed understanding and agreed to proceed.   HPI  Diabetes Mellitus Type II, Follow-up:   RecentLabs       Lab Results  Component Value Date   HGBA1C 8.2 (A) 01/27/2019   HGBA1C 6.7 (A) 09/25/2018   HGBA1C 7.5 (A) 06/26/2018      Last seen for diabetes 1 month ago.  Management since then includes stopped Glipizide and started Jardiance 10 mg. He reports good compliance with treatment. He is not having side effects.  Current symptoms include  Home blood sugar records: fasting range: this morning was 173, highest is 232 - averaging in the 150s (which is much lower than previously) - knows that it varies by what he eats  Episodes of hypoglycemia? no              Current Insulin Regimen: none Most Recent Eye Exam: not UTD - sees Eschbach - had to reschedule due to pandemic Weight trend: stable Current exercise: walking Current diet habits: well balanced  Pertinent Labs: Lab Results  Component Value Date   CHOL 185 06/26/2018   HDL 23 (L) 06/26/2018   LDLCALC 119 (H) 06/26/2018   TRIG 216 (H) 06/26/2018   CHOLHDL 8.0 (H) 06/26/2018       Wt Readings from Last 3 Encounters:  12/09/18 216 lb 9.6 oz (98.2 kg)  09/25/18 209 lb 12.8 oz (95.2 kg)  06/26/18 207 lb 6.4  oz (94.1 kg)    Patient has trouble staying asleep.  No trouble falling asleep.  Only sleeps about 3-4 hours.  Tried a medication in the past that didn't seem to help, but he is not sure what it was called.  Trazodone does not sound familiar.  Does not think he snores.  Has restorative sleep when he is able to sleep.   Allergies  Allergen Reactions  . Amoxicillin     diarrhea and headache Has patient had a PCN reaction causing immediate rash, facial/tongue/throat swelling, SOB or lightheadedness with hypotension: No Has patient had a PCN reaction causing severe rash involving mucus membranes or skin necrosis: No Has patient had a PCN reaction that required hospitalization: No Has patient had a PCN reaction occurring within the last 10 years: Unknown If all of the above answers are "NO", then may proceed with Cephalosporin use.   Marland Kitchen Fexofenadine     Unknown reaction      Current Outpatient Medications:  .  amLODipine (NORVASC) 5 MG tablet, Take 1 tablet (5 mg total) by mouth daily., Disp: 90 tablet, Rfl: 3 .  aspirin 81 MG tablet, Take 81 mg by mouth daily. , Disp: , Rfl:  .  atorvastatin (LIPITOR) 80 MG tablet, TAKE ONE (1) TABLET EACH DAY, Disp: 90 tablet, Rfl: 3 .  blood glucose meter  kit and supplies KIT, Dispense based on patient and insurance preference. Check sugars once daily, Disp: 1 each, Rfl: 0 .  Calcium Citrate-Vitamin D (CALCIUM + D PO), Take 1 tablet by mouth daily., Disp: , Rfl:  .  clopidogrel (PLAVIX) 75 MG tablet, Take 1 tablet (75 mg total) by mouth daily., Disp: 90 tablet, Rfl: 3 .  docusate sodium (COLACE) 100 MG capsule, Take 1 capsule (100 mg total) by mouth 2 (two) times daily., Disp: 10 capsule, Rfl: 0 .  fenofibrate (TRICOR) 145 MG tablet, Take 1 tablet (145 mg total) by mouth daily., Disp: 90 tablet, Rfl: 3 .  hydrochlorothiazide (HYDRODIURIL) 25 MG tablet, Take 1 tablet (25 mg total) by mouth daily., Disp: 90 tablet, Rfl: 3 .  HYDROcodone-acetaminophen  (NORCO/VICODIN) 5-325 MG tablet, Every 6 hours as needed for low back pain, Disp: 60 tablet, Rfl: 0 .  lisinopril (PRINIVIL,ZESTRIL) 20 MG tablet, Take 1 tablet (20 mg total) by mouth daily., Disp: 90 tablet, Rfl: 3 .  metFORMIN (GLUCOPHAGE) 1000 MG tablet, TAKE ONE (1) TABLET BY MOUTH TWO (2) TIMES DAILY WITH FOOD, Disp: 180 tablet, Rfl: 3 .  metoprolol tartrate (LOPRESSOR) 50 MG tablet, Take 1 tablet (50 mg total) by mouth 2 (two) times daily., Disp: 180 tablet, Rfl: 3 .  ONETOUCH VERIO test strip, TEST BLOOD GLUCOSE EVERY DAY, Disp: 50 each, Rfl: 5 .  pantoprazole (PROTONIX) 40 MG tablet, TAKE ONE (1) TABLET BY MOUTH EVERY DAY AS NEEDED FOR HEARTBURN OR ACID REFLUX, Disp: 90 tablet, Rfl: 3 .  pregabalin (LYRICA) 50 MG capsule, TK 2 CS PO BID FOR 30 DAYS, Disp: , Rfl:  .  senna (SENOKOT) 8.6 MG TABS tablet, Take 1 tablet (8.6 mg total) by mouth 2 (two) times daily., Disp: 10 each, Rfl: 0 .  sitaGLIPtin (JANUVIA) 100 MG tablet, Take 1 tablet (100 mg total) by mouth daily., Disp: 90 tablet, Rfl: 3 .  empagliflozin (JARDIANCE) 25 MG TABS tablet, Take 25 mg by mouth daily., Disp: 30 tablet, Rfl: 3 .  traZODone (DESYREL) 50 MG tablet, Take 0.5-1 tablets (25-50 mg total) by mouth at bedtime as needed for sleep., Disp: 30 tablet, Rfl: 3  Review of Systems  Social History   Tobacco Use  . Smoking status: Current Every Day Smoker    Types: Cigars  . Smokeless tobacco: Never Used  . Tobacco comment: smoker, Has been smoking for 12 years, smokes cigars; smokesabout 10-15 cigars a week(varies)  Substance Use Topics  . Alcohol use: No      Objective:   There were no vitals taken for this visit. There were no vitals filed for this visit.   Physical Exam Pulmonary:     Effort: Pulmonary effort is normal.  Neurological:     Mental Status: He is alert and oriented to person, place, and time. Mental status is at baseline.         Assessment & Plan      I discussed the assessment and  treatment plan with the patient. The patient was provided an opportunity to ask questions and all were answered. The patient agreed with the plan and demonstrated an understanding of the instructions.   The patient was advised to call back or seek an in-person evaluation if the symptoms worsen or if the condition fails to improve as anticipated.  Problem List Items Addressed This Visit      Endocrine   T2DM (type 2 diabetes mellitus) ([place]) - Primary    With CAD, HTN, HLD  Uncontrolled on last check BGs improving since starting jardiance Will increase jardiance to '25mg'$  daily Continue metformin and januvia D/c'd glipizide at last visit No hypoglycemia Needs foot exam and eye exam post-pandemic UTD on vaccines On ACEi and statin      Relevant Medications   empagliflozin (JARDIANCE) 25 MG TABS tablet     Other   Insomnia    Chronic and uncontrolled Unsure of previous therapy and do not see anything on chart review Will try trazodone At f/u, consider dose titration Could also consider nortriptyline in the future as it would help with chronic pain as well          Return in about 2 months (around 05/10/2019) for DM and insomnia f/u (needs A1c, foot exam).   The entirety of the information documented in the History of Present Illness, Review of Systems and Physical Exam were personally obtained by me. Portions of this information were initially documented by Tiburcio Pea, CMA and reviewed by me for thoroughness and accuracy.    Bacigalupo, Dionne Bucy, MD MPH [facility]

## 2019-03-18 ENCOUNTER — Other Ambulatory Visit: Payer: Self-pay | Admitting: Family Medicine

## 2019-03-18 DIAGNOSIS — E119 Type 2 diabetes mellitus without complications: Secondary | ICD-10-CM

## 2019-03-18 MED ORDER — GLUCOSE BLOOD VI STRP: ORAL_STRIP | 5 refills | Status: AC

## 2019-03-18 NOTE — Telephone Encounter (Signed)
[facility] faxed refill request for the following medications:  ONETOUCH VERIO test strip   Please advise. Thanks TNP

## 2019-03-25 ENCOUNTER — Other Ambulatory Visit: Payer: Self-pay | Admitting: Family Medicine

## 2019-03-25 DIAGNOSIS — M47816 Spondylosis without myelopathy or radiculopathy, lumbar region: Secondary | ICD-10-CM

## 2019-03-25 NOTE — Telephone Encounter (Signed)
Pt needs refill on   Hydrocodone 5/325  [facility]  Raynelle Chary

## 2019-03-26 MED ORDER — HYDROCODONE-ACETAMINOPHEN 5-325 MG PO TABS: ORAL_TABLET | ORAL | 0 refills | Status: DC

## 2019-03-31 DIAGNOSIS — I10 Essential (primary) hypertension: Secondary | ICD-10-CM | POA: Diagnosis not present

## 2019-03-31 DIAGNOSIS — E785 Hyperlipidemia, unspecified: Secondary | ICD-10-CM | POA: Diagnosis not present

## 2019-03-31 DIAGNOSIS — I2510 Atherosclerotic heart disease of native coronary artery without angina pectoris: Secondary | ICD-10-CM | POA: Diagnosis not present

## 2019-03-31 DIAGNOSIS — Z9889 Other specified postprocedural states: Secondary | ICD-10-CM | POA: Diagnosis not present

## 2019-03-31 DIAGNOSIS — Z955 Presence of coronary angioplasty implant and graft: Secondary | ICD-10-CM | POA: Diagnosis not present

## 2019-04-28 ENCOUNTER — Other Ambulatory Visit: Payer: Self-pay | Admitting: Family Medicine

## 2019-04-28 DIAGNOSIS — M47816 Spondylosis without myelopathy or radiculopathy, lumbar region: Secondary | ICD-10-CM

## 2019-04-28 MED ORDER — HYDROCODONE-ACETAMINOPHEN 5-325 MG PO TABS: ORAL_TABLET | ORAL | 0 refills | Status: DC

## 2019-04-28 NOTE — Telephone Encounter (Signed)
Pt needs a refill on his  Hydrocodone 5-325  [facility] [address] [place]

## 2019-04-28 NOTE — Telephone Encounter (Signed)
L.O.V. 03/10/2019, please advise. 

## 2019-05-05 ENCOUNTER — Ambulatory Visit (INDEPENDENT_AMBULATORY_CARE_PROVIDER_SITE_OTHER): Payer: PPO | Admitting: Family Medicine

## 2019-05-05 ENCOUNTER — Other Ambulatory Visit: Payer: Self-pay

## 2019-05-05 ENCOUNTER — Encounter: Payer: Self-pay | Admitting: Family Medicine

## 2019-05-05 VITALS — BP 143/86 | HR 64 | Temp 97.7°F | Resp 16 | Ht 72.0 in | Wt 212.0 lb

## 2019-05-05 DIAGNOSIS — I10 Essential (primary) hypertension: Secondary | ICD-10-CM | POA: Diagnosis not present

## 2019-05-05 DIAGNOSIS — F5101 Primary insomnia: Secondary | ICD-10-CM | POA: Diagnosis not present

## 2019-05-05 DIAGNOSIS — E1169 Type 2 diabetes mellitus with other specified complication: Secondary | ICD-10-CM

## 2019-05-05 LAB — POCT GLYCOSYLATED HEMOGLOBIN (HGB A1C): Hemoglobin A1C: 8.3 % — AB (ref 4.0–5.6)

## 2019-05-05 MED ORDER — TRULICITY 1.5 MG/0.5ML ~~LOC~~ SOAJ: 1.5000 mg | SUBCUTANEOUS | 3 refills | Status: DC

## 2019-05-05 MED ORDER — TRAZODONE HCL 100 MG PO TABS: 100.0000 mg | ORAL_TABLET | Freq: Every evening | ORAL | 5 refills | Status: DC | PRN

## 2019-05-05 NOTE — Assessment & Plan Note (Signed)
With CAD, HTN, HLD Uncontrolled with A1c 8.3 today Continue metformin and Jardiance at full dose We will stop Januvia and add weekly GLP-1 instead First dose of Trulicity given with a sample in office today You will continue the 0.75 mg dose next week and then start on the 1.5 mg dose thereafter He is on ACE inhibitor and statin Needs an eye exam Needs a foot exam at next visit Up-to-date on vaccines Follow-up in 3 months and repeat A1c

## 2019-05-05 NOTE — Assessment & Plan Note (Signed)
Chronic Uncontrolled, but has not taken medication yet today Will not change medications Advised to take meds prior to next appt Repeat CMP at next visit

## 2019-05-05 NOTE — Assessment & Plan Note (Signed)
Chronic, improving Still waking in the middle of the night Increase trazodone to 100mg  qhs prn Could consider nortriptyline in the future as it would help with chronic pain as well

## 2019-05-05 NOTE — Progress Notes (Signed)
Patient: [name] Male    DOB: [birth year]   68 y.o.   MRN: [id number] Visit Date: 05/05/2019  Today's Provider: Lavon Paganini, MD   Chief Complaint  Patient presents with  . Follow-up  . Diabetes   Subjective:     HPI    Diabetes Mellitus Type II, Follow-up:   Lab Results  Component Value Date   HGBA1C 8.2 (A) 01/27/2019   HGBA1C 6.7 (A) 09/25/2018   HGBA1C 7.5 (A) 06/26/2018   Last seen for diabetes 2 months ago.   Management since then includes; increased  jardiance to '25mg'$  daily.Continue metformin and  januvia. He reports good compliance with treatment. He is not having side effects. none Current symptoms include none and have been unchanged. Home blood sugar records: fasting range: 177-219  Episodes of hypoglycemia? no   Current Insulin Regimen: n/a Most Recent Eye Exam: scheduled for September  Weight trend: stable Prior visit with dietician: no Current diet: well balanced Current exercise: walking  -----------------------------------------------------------------   Allergies  Allergen Reactions  . Amoxicillin     diarrhea and headache Has patient had a PCN reaction causing immediate rash, facial/tongue/throat swelling, SOB or lightheadedness with hypotension: No Has patient had a PCN reaction causing severe rash involving mucus membranes or skin necrosis: No Has patient had a PCN reaction that required hospitalization: No Has patient had a PCN reaction occurring within the last 10 years: Unknown If all of the above answers are "NO", then may proceed with Cephalosporin use.   Marland Kitchen Fexofenadine     Unknown reaction      Current Outpatient Medications:  .  amLODipine (NORVASC) 5 MG tablet, Take 1 tablet (5 mg total) by mouth daily., Disp: 90 tablet, Rfl: 3 .  aspirin 81 MG tablet, Take 81 mg by mouth daily. , Disp: , Rfl:  .  atorvastatin (LIPITOR) 80 MG tablet, TAKE ONE (1) TABLET EACH DAY, Disp: 90 tablet, Rfl: 3 .  blood glucose  meter kit and supplies KIT, Dispense based on patient and insurance preference. Check sugars once daily, Disp: 1 each, Rfl: 0 .  Calcium Citrate-Vitamin D (CALCIUM + D PO), Take 1 tablet by mouth daily., Disp: , Rfl:  .  clopidogrel (PLAVIX) 75 MG tablet, Take 1 tablet (75 mg total) by mouth daily., Disp: 90 tablet, Rfl: 3 .  empagliflozin (JARDIANCE) 25 MG TABS tablet, Take 25 mg by mouth daily., Disp: 30 tablet, Rfl: 3 .  fenofibrate (TRICOR) 145 MG tablet, Take 1 tablet (145 mg total) by mouth daily., Disp: 90 tablet, Rfl: 3 .  glucose blood (ONETOUCH VERIO) test strip, TEST BLOOD GLUCOSE EVERY DAY, Disp: 50 each, Rfl: 5 .  hydrochlorothiazide (HYDRODIURIL) 25 MG tablet, Take 1 tablet (25 mg total) by mouth daily., Disp: 90 tablet, Rfl: 3 .  HYDROcodone-acetaminophen (NORCO/VICODIN) 5-325 MG tablet, Every 6 hours as needed for low back pain, Disp: 60 tablet, Rfl: 0 .  lisinopril (PRINIVIL,ZESTRIL) 20 MG tablet, Take 1 tablet (20 mg total) by mouth daily., Disp: 90 tablet, Rfl: 3 .  metFORMIN (GLUCOPHAGE) 1000 MG tablet, TAKE ONE (1) TABLET BY MOUTH TWO (2) TIMES DAILY WITH FOOD, Disp: 180 tablet, Rfl: 3 .  metoprolol tartrate (LOPRESSOR) 50 MG tablet, Take 1 tablet (50 mg total) by mouth 2 (two) times daily., Disp: 180 tablet, Rfl: 3 .  pantoprazole (PROTONIX) 40 MG tablet, TAKE ONE (1) TABLET BY MOUTH EVERY DAY AS NEEDED FOR HEARTBURN OR ACID REFLUX, Disp: 90 tablet, Rfl:  3 .  pregabalin (LYRICA) 50 MG capsule, TK 2 CS PO BID FOR 30 DAYS, Disp: , Rfl:  .  senna (SENOKOT) 8.6 MG TABS tablet, Take 1 tablet (8.6 mg total) by mouth 2 (two) times daily., Disp: 10 each, Rfl: 0 .  sitaGLIPtin (JANUVIA) 100 MG tablet, Take 1 tablet (100 mg total) by mouth daily., Disp: 90 tablet, Rfl: 3 .  traZODone (DESYREL) 50 MG tablet, Take 0.5-1 tablets (25-50 mg total) by mouth at bedtime as needed for sleep., Disp: 30 tablet, Rfl: 3 .  docusate sodium (COLACE) 100 MG capsule, Take 1 capsule (100 mg total) by mouth  2 (two) times daily. (Patient not taking: Reported on 05/05/2019), Disp: 10 capsule, Rfl: 0  Review of Systems  Constitutional: Negative for appetite change, chills and fever.  Respiratory: Negative for chest tightness, shortness of breath and wheezing.   Cardiovascular: Negative for chest pain and palpitations.  Gastrointestinal: Negative for abdominal pain, nausea and vomiting.    Social History   Tobacco Use  . Smoking status: Current Every Day Smoker    Types: Cigars  . Smokeless tobacco: Never Used  . Tobacco comment: smoker, Has been smoking for 12 years, smokes cigars; smokesabout 10-15 cigars a week(varies)  Substance Use Topics  . Alcohol use: No      Objective:   BP (!) 143/86 (BP Location: Right Arm, Patient Position: Sitting, Cuff Size: Large)   Pulse 64   Temp 97.7 F (36.5 C) (Oral)   Resp 16   Ht 6' (1.829 m)   Wt 212 lb (96.2 kg)   SpO2 96%   BMI 28.75 kg/m  Vitals:   05/05/19 1107  BP: (!) 143/86  Pulse: 64  Resp: 16  Temp: 97.7 F (36.5 C)  TempSrc: Oral  SpO2: 96%  Weight: 212 lb (96.2 kg)  Height: 6' (1.829 m)     Physical Exam Vitals signs reviewed.  Constitutional:      General: He is not in acute distress.    Appearance: Normal appearance. He is not diaphoretic.  HENT:     Head: Normocephalic and atraumatic.  Eyes:     General: No scleral icterus.    Conjunctiva/sclera: Conjunctivae normal.  Neck:     Musculoskeletal: Neck supple.  Cardiovascular:     Rate and Rhythm: Normal rate and regular rhythm.     Pulses: Normal pulses.     Heart sounds: Normal heart sounds. No murmur.  Pulmonary:     Effort: Pulmonary effort is normal. No respiratory distress.     Breath sounds: Normal breath sounds. No wheezing or rhonchi.  Musculoskeletal:     Right lower leg: No edema.     Left lower leg: No edema.  Lymphadenopathy:     Cervical: No cervical adenopathy.  Skin:    General: Skin is warm and dry.     Capillary Refill: Capillary  refill takes less than 2 seconds.     Findings: No rash.  Neurological:     Mental Status: He is alert and oriented to person, place, and time. Mental status is at baseline.     Cranial Nerves: No cranial nerve deficit.  Psychiatric:        Mood and Affect: Mood normal.        Behavior: Behavior normal.      No results found for any visits on 05/05/19.     Assessment & Plan   Problem List Items Addressed This Visit      Cardiovascular and  Mediastinum   Essential (primary) hypertension    Chronic Uncontrolled, but has not taken medication yet today Will not change medications Advised to take meds prior to next appt Repeat CMP at next visit        Endocrine   T2DM (type 2 diabetes mellitus) ([place]) - Primary    With CAD, HTN, HLD Uncontrolled with A1c 8.3 today Continue metformin and Jardiance at full dose We will stop Januvia and add weekly GLP-1 instead First dose of Trulicity given with a sample in office today You will continue the 0.75 mg dose next week and then start on the 1.5 mg dose thereafter He is on ACE inhibitor and statin Needs an eye exam Needs a foot exam at next visit Up-to-date on vaccines Follow-up in 3 months and repeat A1c      Relevant Medications   Dulaglutide (TRULICITY) 1.5 KE/0.9HA SOPN   Other Relevant Orders   POCT glycosylated hemoglobin (Hb A1C) (Completed)     Other   Insomnia    Chronic, improving Still waking in the middle of the night Increase trazodone to '100mg'$  qhs prn Could consider nortriptyline in the future as it would help with chronic pain as well          Return in about 3 months (around 08/05/2019) for chronic disease f/u.   The entirety of the information documented in the History of Present Illness, Review of Systems and Physical Exam were personally obtained by me. Portions of this information were initially documented by [name], CMA and reviewed by me for thoroughness and accuracy.    Roschelle Calandra,  Dionne Bucy, MD MPH [facility]

## 2019-05-26 ENCOUNTER — Other Ambulatory Visit: Payer: Self-pay | Admitting: Family Medicine

## 2019-05-26 DIAGNOSIS — M47816 Spondylosis without myelopathy or radiculopathy, lumbar region: Secondary | ICD-10-CM

## 2019-05-26 NOTE — Telephone Encounter (Signed)
Pt needing a refill on: °HYDROcodone-acetaminophen (NORCO/VICODIN) 5-325 MG tablet ° °Please fill at: ° °[id number] - [place], [place] - [address] [phone] (Phone) °[phone] (Fax)  ° °Thanks, °[facility] °

## 2019-05-26 NOTE — Telephone Encounter (Signed)
L.O.V. was 05/05/2019, please advise. 

## 2019-05-27 MED ORDER — HYDROCODONE-ACETAMINOPHEN 5-325 MG PO TABS: ORAL_TABLET | ORAL | 0 refills | Status: DC

## 2019-06-09 ENCOUNTER — Other Ambulatory Visit: Payer: Self-pay | Admitting: Family Medicine

## 2019-06-09 MED ORDER — LISINOPRIL 20 MG PO TABS: 20.0000 mg | ORAL_TABLET | Freq: Every day | ORAL | 3 refills | Status: DC

## 2019-06-09 NOTE — Telephone Encounter (Signed)
Please review. Thanks!  

## 2019-06-09 NOTE — Telephone Encounter (Signed)
[facility] faxed refill request for the following medications: ° °lisinopril (PRINIVIL,ZESTRIL) 20 MG tablet ° ° °Please advise. °

## 2019-06-25 ENCOUNTER — Other Ambulatory Visit: Payer: Self-pay | Admitting: Family Medicine

## 2019-06-25 DIAGNOSIS — M47816 Spondylosis without myelopathy or radiculopathy, lumbar region: Secondary | ICD-10-CM

## 2019-06-25 MED ORDER — HYDROCODONE-ACETAMINOPHEN 5-325 MG PO TABS: ORAL_TABLET | ORAL | 0 refills | Status: DC

## 2019-06-25 NOTE — Telephone Encounter (Signed)
L.O.V. was 05/05/2019, please advise. 

## 2019-06-25 NOTE — Telephone Encounter (Signed)
Patient needs refill on Hydrocodone 5-325 mg sent to [facility] in [place] . °

## 2019-07-17 DIAGNOSIS — E119 Type 2 diabetes mellitus without complications: Secondary | ICD-10-CM | POA: Diagnosis not present

## 2019-07-17 LAB — HM DIABETES EYE EXAM

## 2019-07-21 ENCOUNTER — Other Ambulatory Visit: Payer: Self-pay | Admitting: Family Medicine

## 2019-07-21 MED ORDER — EMPAGLIFLOZIN 25 MG PO TABS: 25.0000 mg | ORAL_TABLET | Freq: Every day | ORAL | 3 refills | Status: DC

## 2019-07-21 NOTE — Telephone Encounter (Signed)
[facility] faxed refill request for the following medications:  empagliflozin (JARDIANCE) 25 MG TABS tablet   LOV: 05/05/2019 NOV: 08/11/2019 Please advise. Thanks TNP

## 2019-07-24 ENCOUNTER — Other Ambulatory Visit: Payer: Self-pay

## 2019-07-24 DIAGNOSIS — M47816 Spondylosis without myelopathy or radiculopathy, lumbar region: Secondary | ICD-10-CM

## 2019-07-24 NOTE — Telephone Encounter (Signed)
Patients daughter [name] called requesting a refill.

## 2019-07-25 ENCOUNTER — Other Ambulatory Visit: Payer: Self-pay | Admitting: Family Medicine

## 2019-07-25 DIAGNOSIS — M47816 Spondylosis without myelopathy or radiculopathy, lumbar region: Secondary | ICD-10-CM

## 2019-07-25 MED ORDER — HYDROCODONE-ACETAMINOPHEN 5-325 MG PO TABS: ORAL_TABLET | ORAL | 0 refills | Status: DC

## 2019-07-25 MED ORDER — HYDROCODONE-ACETAMINOPHEN 5-325 MG PO TABS: 1.0000 | ORAL_TABLET | Freq: Four times a day (QID) | ORAL | 0 refills | Status: DC | PRN

## 2019-07-25 NOTE — Telephone Encounter (Signed)
Oops. I signed it and didn't realize.  IT is 1-2 tabs q6hr prn #60 r0  I can send a new Rx if they need

## 2019-07-25 NOTE — Telephone Encounter (Signed)
Su at [facility] called needing clarifications on the pats Hydrocodone.  It states to take every 6 hrs but does not say if it is one pill, two.Marland Kitchen  CB# [phone]  teri

## 2019-08-04 ENCOUNTER — Encounter: Payer: Self-pay | Admitting: Family Medicine

## 2019-08-04 ENCOUNTER — Other Ambulatory Visit: Payer: Self-pay

## 2019-08-04 ENCOUNTER — Ambulatory Visit (INDEPENDENT_AMBULATORY_CARE_PROVIDER_SITE_OTHER): Payer: PPO | Admitting: Family Medicine

## 2019-08-04 VITALS — BP 135/82 | HR 83 | Temp 97.5°F | Wt 207.0 lb

## 2019-08-04 DIAGNOSIS — Z23 Encounter for immunization: Secondary | ICD-10-CM

## 2019-08-04 DIAGNOSIS — Z1159 Encounter for screening for other viral diseases: Secondary | ICD-10-CM

## 2019-08-04 DIAGNOSIS — E1169 Type 2 diabetes mellitus with other specified complication: Secondary | ICD-10-CM | POA: Diagnosis not present

## 2019-08-04 DIAGNOSIS — F5101 Primary insomnia: Secondary | ICD-10-CM | POA: Diagnosis not present

## 2019-08-04 DIAGNOSIS — I10 Essential (primary) hypertension: Secondary | ICD-10-CM

## 2019-08-04 DIAGNOSIS — E785 Hyperlipidemia, unspecified: Secondary | ICD-10-CM | POA: Diagnosis not present

## 2019-08-04 LAB — POCT GLYCOSYLATED HEMOGLOBIN (HGB A1C)
Est. average glucose Bld gHb Est-mCnc: 166
Hemoglobin A1C: 7.4 % — AB (ref 4.0–5.6)

## 2019-08-04 MED ORDER — TRAZODONE HCL 150 MG PO TABS: 150.0000 mg | ORAL_TABLET | Freq: Every evening | ORAL | 5 refills | Status: DC | PRN

## 2019-08-04 MED ORDER — TRULICITY 1.5 MG/0.5ML ~~LOC~~ SOAJ: 1.5000 mg | SUBCUTANEOUS | 3 refills | Status: DC

## 2019-08-04 NOTE — Progress Notes (Signed)
Patient: [name] Male    DOB: 02-Aug-1951   68 y.o.   MRN: [id number] Visit Date: 08/06/2019  Today's Provider: Lavon Paganini, MD   Chief Complaint  Patient presents with  . Diabetes  . Hypertension   Subjective:    I, Porsha McClurkin CMA, am acting as a scribe for Lavon Paganini, MD.   HPI  Diabetes Mellitus Type II, Follow-up:   Lab Results  Component Value Date   HGBA1C 7.4 (A) 08/04/2019   HGBA1C 8.3 (A) 05/05/2019   HGBA1C 8.2 (A) 01/27/2019   Last seen for diabetes 3 months ago.  Management since then includes:will stop Januvia and add weekly GLP-1. He reports good compliance with treatment. He is not having side effects.  Current symptoms include none and have been stable. Home blood sugar records: arranges 130's-150's  Episodes of hypoglycemia? no   Current Insulin Regimen:  Most Recent Eye Exam: reports it was within last few months Weight trend: stable Prior visit with dietician: no Current diet: well balanced Current exercise: walking  ------------------------------------------------------------------------   Hypertension, follow-up:  BP Readings from Last 3 Encounters:  08/04/19 135/82  05/05/19 (!) 143/86  12/09/18 138/90    He was last seen for hypertension 3 months ago.  BP at that visit was 143/86. Management since that visit includes:no changes made.He reports good compliance with treatment. He is not having side effects.  He is exercising. He is adherent to low salt diet.   Outside blood pressures are not checked. He is experiencing none.  Patient denies chest pain, chest pressure/discomfort, exertional chest pressure/discomfort, fatigue, irregular heart beat, lower extremity edema and palpitations.   Cardiovascular risk factors include advanced age (older than 70 for men, 91 for women), diabetes mellitus and hypertension.  Use of agents associated with hypertension: none.    ------------------------------------------------------------------------    Allergies  Allergen Reactions  . Amoxicillin     diarrhea and headache Has patient had a PCN reaction causing immediate rash, facial/tongue/throat swelling, SOB or lightheadedness with hypotension: No Has patient had a PCN reaction causing severe rash involving mucus membranes or skin necrosis: No Has patient had a PCN reaction that required hospitalization: No Has patient had a PCN reaction occurring within the last 10 years: Unknown If all of the above answers are "NO", then may proceed with Cephalosporin use.   Marland Kitchen Fexofenadine     Unknown reaction      Current Outpatient Medications:  .  amLODipine (NORVASC) 5 MG tablet, Take 1 tablet (5 mg total) by mouth daily., Disp: 90 tablet, Rfl: 3 .  aspirin 81 MG tablet, Take 81 mg by mouth daily. , Disp: , Rfl:  .  atorvastatin (LIPITOR) 80 MG tablet, TAKE ONE (1) TABLET EACH DAY, Disp: 90 tablet, Rfl: 3 .  blood glucose meter kit and supplies KIT, Dispense based on patient and insurance preference. Check sugars once daily, Disp: 1 each, Rfl: 0 .  Calcium Citrate-Vitamin D (CALCIUM + D PO), Take 1 tablet by mouth daily., Disp: , Rfl:  .  clopidogrel (PLAVIX) 75 MG tablet, Take 1 tablet (75 mg total) by mouth daily., Disp: 90 tablet, Rfl: 3 .  Dulaglutide (TRULICITY) 1.5 DV/7.6HY SOPN, Inject 1.5 mg into the skin once a week., Disp: 2 mL, Rfl: 3 .  empagliflozin (JARDIANCE) 25 MG TABS tablet, Take 25 mg by mouth daily., Disp: 30 tablet, Rfl: 3 .  fenofibrate (TRICOR) 145 MG tablet, Take 1 tablet (145 mg total) by mouth daily., Disp:  90 tablet, Rfl: 3 .  glucose blood (ONETOUCH VERIO) test strip, TEST BLOOD GLUCOSE EVERY DAY, Disp: 50 each, Rfl: 5 .  hydrochlorothiazide (HYDRODIURIL) 25 MG tablet, Take 1 tablet (25 mg total) by mouth daily., Disp: 90 tablet, Rfl: 3 .  HYDROcodone-acetaminophen (NORCO/VICODIN) 5-325 MG tablet, Take 1-2 tablets by mouth every 6 (six)  hours as needed for moderate pain. Every 6 hours as needed for low back pain, Disp: 60 tablet, Rfl: 0 .  lisinopril (ZESTRIL) 20 MG tablet, Take 1 tablet (20 mg total) by mouth daily., Disp: 90 tablet, Rfl: 3 .  metFORMIN (GLUCOPHAGE) 1000 MG tablet, TAKE ONE (1) TABLET BY MOUTH TWO (2) TIMES DAILY WITH FOOD, Disp: 180 tablet, Rfl: 3 .  metoprolol tartrate (LOPRESSOR) 50 MG tablet, Take 1 tablet (50 mg total) by mouth 2 (two) times daily., Disp: 180 tablet, Rfl: 3 .  pantoprazole (PROTONIX) 40 MG tablet, TAKE ONE (1) TABLET BY MOUTH EVERY DAY AS NEEDED FOR HEARTBURN OR ACID REFLUX, Disp: 90 tablet, Rfl: 3 .  senna (SENOKOT) 8.6 MG TABS tablet, Take 1 tablet (8.6 mg total) by mouth 2 (two) times daily., Disp: 10 each, Rfl: 0 .  traZODone (DESYREL) 150 MG tablet, Take 1 tablet (150 mg total) by mouth at bedtime as needed for sleep., Disp: 30 tablet, Rfl: 5 .  docusate sodium (COLACE) 100 MG capsule, Take 1 capsule (100 mg total) by mouth 2 (two) times daily. (Patient not taking: Reported on 05/05/2019), Disp: 10 capsule, Rfl: 0  Review of Systems  Constitutional: Negative.   Respiratory: Negative.   Genitourinary: Negative.   Neurological: Negative.     Social History   Tobacco Use  . Smoking status: Current Every Day Smoker    Types: Cigars  . Smokeless tobacco: Never Used  . Tobacco comment: smoker, Has been smoking for 12 years, smokes cigars; smokesabout 10-15 cigars a week(varies)  Substance Use Topics  . Alcohol use: No      Objective:   BP 135/82 (BP Location: Left Arm, Patient Position: Sitting, Cuff Size: Normal)   Pulse 83   Temp (!) 97.5 F (36.4 C) (Temporal)   Wt 207 lb (93.9 kg)   SpO2 95%   BMI 28.07 kg/m  Vitals:   08/04/19 1600  BP: 135/82  Pulse: 83  Temp: (!) 97.5 F (36.4 C)  TempSrc: Temporal  SpO2: 95%  Weight: 207 lb (93.9 kg)  Body mass index is 28.07 kg/m.   Physical Exam Vitals signs reviewed.  Constitutional:      General: He is not in  acute distress.    Appearance: Normal appearance. He is not diaphoretic.  HENT:     Head: Normocephalic and atraumatic.  Eyes:     General: No scleral icterus.    Conjunctiva/sclera: Conjunctivae normal.  Neck:     Musculoskeletal: Neck supple.  Cardiovascular:     Rate and Rhythm: Normal rate and regular rhythm.     Pulses: Normal pulses.     Heart sounds: Normal heart sounds. No murmur.  Pulmonary:     Effort: Pulmonary effort is normal. No respiratory distress.     Breath sounds: Normal breath sounds. No wheezing or rhonchi.  Abdominal:     General: There is no distension.     Palpations: Abdomen is soft.     Tenderness: There is no abdominal tenderness.  Musculoskeletal:     Right lower leg: No edema.     Left lower leg: No edema.  Lymphadenopathy:  Cervical: No cervical adenopathy.  Skin:    General: Skin is warm and dry.     Capillary Refill: Capillary refill takes less than 2 seconds.     Findings: No rash.  Neurological:     Mental Status: He is alert and oriented to person, place, and time.     Cranial Nerves: No cranial nerve deficit.  Psychiatric:        Mood and Affect: Mood normal.        Behavior: Behavior normal.    Diabetic Foot Exam - Simple   Simple Foot Form Diabetic Foot exam was performed with the following findings: Yes 08/04/2019  4:47 PM  Visual Inspection No deformities, no ulcerations, no other skin breakdown bilaterally: Yes Sensation Testing Intact to touch and monofilament testing bilaterally: Yes Pulse Check Posterior Tibialis and Dorsalis pulse intact bilaterally: Yes Comments      Results for orders placed or performed in visit on 08/04/19  Comprehensive metabolic panel  Result Value Ref Range   Glucose 136 (H) 65 - 99 mg/dL   BUN 17 8 - 27 mg/dL   Creatinine, Ser 1.11 0.76 - 1.27 mg/dL   GFR calc non Af Amer 67 >59 mL/min/1.73   GFR calc Af Amer 78 >59 mL/min/1.73   BUN/Creatinine Ratio 15 10 - 24   Sodium 141 134 - 144  mmol/L   Potassium 3.9 3.5 - 5.2 mmol/L   Chloride 104 96 - 106 mmol/L   CO2 22 20 - 29 mmol/L   Calcium 10.0 8.6 - 10.2 mg/dL   Total Protein 6.7 6.0 - 8.5 g/dL   Albumin 4.6 3.8 - 4.8 g/dL   Globulin, Total 2.1 1.5 - 4.5 g/dL   Albumin/Globulin Ratio 2.2 1.2 - 2.2   Bilirubin Total 0.3 0.0 - 1.2 mg/dL   Alkaline Phosphatase 37 (L) 39 - 117 IU/L   AST 15 0 - 40 IU/L   ALT 17 0 - 44 IU/L  Lipid panel  Result Value Ref Range   Cholesterol, Total 124 100 - 199 mg/dL   Triglycerides 147 0 - 149 mg/dL   HDL 25 (L) >39 mg/dL   VLDL Cholesterol Cal 26 5 - 40 mg/dL   LDL Chol Calc (NIH) 73 0 - 99 mg/dL   Chol/HDL Ratio 5.0 0.0 - 5.0 ratio  Hepatitis C Antibody  Result Value Ref Range   Hep C Virus Ab <0.1 0.0 - 0.9 s/co ratio  POCT glycosylated hemoglobin (Hb A1C)  Result Value Ref Range   Hemoglobin A1C 7.4 (A) 4.0 - 5.6 %   HbA1c POC (<> result, manual entry)     HbA1c, POC (prediabetic range)     HbA1c, POC (controlled diabetic range)     Est. average glucose Bld gHb Est-mCnc 166        Assessment & Plan   Problem List Items Addressed This Visit      Cardiovascular and Mediastinum   Essential (primary) hypertension    Well controlled Continue current medications Recheck metabolic panel F/u in 6 months       Relevant Orders   Comprehensive metabolic panel (Completed)     Endocrine   Hyperlipidemia associated with type 2 diabetes mellitus (H[facility])    Tolerating statin well-continue Recheck lipid panel and CMP      Relevant Medications   Dulaglutide (TRULICITY) 1.5 OH/6.0VP SOPN   Other Relevant Orders   Comprehensive metabolic panel (Completed)   Lipid panel (Completed)   T2DM (type 2 diabetes mellitus) ([place]) -  Primary    Associated with CAD, hypertension, hyperlipidemia Uncontrolled, but A1c is improving from 8.3-7.4 today Patient is hesitant to add another medication at this time as he was only able to recently start Trulicity We will continue metformin,  Jardiance, Trulicity at full dose On ACE inhibitor and statin We will send ROI for last eye exam Foot exam completed today Up-to-date on vaccinations Follow-up in 3 months and repeat A1c      Relevant Medications   Dulaglutide (TRULICITY) 1.5 VA/7.0LI SOPN   Other Relevant Orders   POCT glycosylated hemoglobin (Hb A1C) (Completed)     Other   Insomnia    Chronic and improving Still waking in the middle of the night Increase trazodone to 150 mg nightly as needed Could consider nortriptyline in the future as it could help with chronic pain as well       Other Visit Diagnoses    Need for hepatitis C screening test       Relevant Orders   Hepatitis C Antibody (Completed)   Need for influenza vaccination       Relevant Orders   Flu Vaccine QUAD High Dose(Fluad) (Completed)       Return in about 3 months (around 11/04/2019) for CPE/AWV.   The entirety of the information documented in the History of Present Illness, Review of Systems and Physical Exam were personally obtained by me. Portions of this information were initially documented by Tiburcio Pea, CMA and reviewed by me for thoroughness and accuracy.    Elianis Fischbach, Dionne Bucy, MD MPH [facility]

## 2019-08-05 ENCOUNTER — Ambulatory Visit: Payer: PPO | Admitting: Family Medicine

## 2019-08-05 DIAGNOSIS — E1169 Type 2 diabetes mellitus with other specified complication: Secondary | ICD-10-CM | POA: Diagnosis not present

## 2019-08-05 DIAGNOSIS — I10 Essential (primary) hypertension: Secondary | ICD-10-CM | POA: Diagnosis not present

## 2019-08-05 DIAGNOSIS — E785 Hyperlipidemia, unspecified: Secondary | ICD-10-CM | POA: Diagnosis not present

## 2019-08-05 DIAGNOSIS — Z1159 Encounter for screening for other viral diseases: Secondary | ICD-10-CM | POA: Diagnosis not present

## 2019-08-05 DIAGNOSIS — Z9889 Other specified postprocedural states: Secondary | ICD-10-CM | POA: Diagnosis not present

## 2019-08-05 DIAGNOSIS — I2510 Atherosclerotic heart disease of native coronary artery without angina pectoris: Secondary | ICD-10-CM | POA: Diagnosis not present

## 2019-08-05 DIAGNOSIS — I00 Rheumatic fever without heart involvement: Secondary | ICD-10-CM | POA: Diagnosis not present

## 2019-08-05 DIAGNOSIS — E119 Type 2 diabetes mellitus without complications: Secondary | ICD-10-CM | POA: Diagnosis not present

## 2019-08-05 DIAGNOSIS — Z955 Presence of coronary angioplasty implant and graft: Secondary | ICD-10-CM | POA: Diagnosis not present

## 2019-08-06 ENCOUNTER — Encounter: Payer: Self-pay | Admitting: Family Medicine

## 2019-08-06 LAB — COMPREHENSIVE METABOLIC PANEL
ALT: 17 IU/L (ref 0–44)
AST: 15 IU/L (ref 0–40)
Albumin/Globulin Ratio: 2.2 (ref 1.2–2.2)
Albumin: 4.6 g/dL (ref 3.8–4.8)
Alkaline Phosphatase: 37 IU/L — ABNORMAL LOW (ref 39–117)
BUN/Creatinine Ratio: 15 (ref 10–24)
BUN: 17 mg/dL (ref 8–27)
Bilirubin Total: 0.3 mg/dL (ref 0.0–1.2)
CO2: 22 mmol/L (ref 20–29)
Calcium: 10 mg/dL (ref 8.6–10.2)
Chloride: 104 mmol/L (ref 96–106)
Creatinine, Ser: 1.11 mg/dL (ref 0.76–1.27)
GFR calc Af Amer: 78 mL/min/{1.73_m2} (ref >59)
GFR calc non Af Amer: 67 mL/min/{1.73_m2} (ref >59)
Globulin, Total: 2.1 g/dL (ref 1.5–4.5)
Glucose: 136 mg/dL — ABNORMAL HIGH (ref 65–99)
Potassium: 3.9 mmol/L (ref 3.5–5.2)
Sodium: 141 mmol/L (ref 134–144)
Total Protein: 6.7 g/dL (ref 6.0–8.5)

## 2019-08-06 LAB — LIPID PANEL
Chol/HDL Ratio: 5 ratio (ref 0.0–5.0)
Cholesterol, Total: 124 mg/dL (ref 100–199)
HDL: 25 mg/dL — ABNORMAL LOW (ref >39)
LDL Chol Calc (NIH): 73 mg/dL (ref 0–99)
Triglycerides: 147 mg/dL (ref 0–149)
VLDL Cholesterol Cal: 26 mg/dL (ref 5–40)

## 2019-08-06 LAB — HEPATITIS C ANTIBODY: Hep C Virus Ab: <0.1 s/co ratio (ref 0.0–0.9)

## 2019-08-06 NOTE — Assessment & Plan Note (Signed)
Well controlled Continue current medications Recheck metabolic panel F/u in 6 months  

## 2019-08-06 NOTE — Assessment & Plan Note (Signed)
Chronic and improving Still waking in the middle of the night Increase trazodone to 150 mg nightly as needed Could consider nortriptyline in the future as it could help with chronic pain as well

## 2019-08-06 NOTE — Assessment & Plan Note (Signed)
Associated with CAD, hypertension, hyperlipidemia Uncontrolled, but A1c is improving from 8.3-7.4 today Patient is hesitant to add another medication at this time as he was only able to recently start Trulicity We will continue metformin, Jardiance, Trulicity at full dose On ACE inhibitor and statin We will send ROI for last eye exam Foot exam completed today Up-to-date on vaccinations Follow-up in 3 months and repeat A1c

## 2019-08-06 NOTE — Assessment & Plan Note (Signed)
Tolerating statin well-continue Recheck lipid panel and CMP 

## 2019-08-11 ENCOUNTER — Ambulatory Visit: Payer: PPO | Admitting: Family Medicine

## 2019-08-11 ENCOUNTER — Telehealth: Payer: Self-pay

## 2019-08-11 MED ORDER — EZETIMIBE 10 MG PO TABS: 10.0000 mg | ORAL_TABLET | Freq: Every day | ORAL | 3 refills | Status: DC

## 2019-08-11 NOTE — Telephone Encounter (Signed)
Pt advised of lab results.  He states he was to try another cholesterol medicine.  Please send to [facility] in [place].   Thanks,   -Mickel Baas

## 2019-08-11 NOTE — Telephone Encounter (Signed)
New prescription sent for Zetia 10 mg daily.  He will continue to take atorvastatin at current dose and add this as an additional medication.  This does not replace the atorvastatin.

## 2019-08-11 NOTE — Telephone Encounter (Signed)
Notes recorded by Virginia Crews, MD on 08/06/2019 at 4:55 PM EDT  Normal kidney function, liver function, electrolytes. A1c at 7.4 as we discussed. If still elevated at next visit, we will consider adding another medication. Cholesterol is significantly better than it was last year, but still not quite to goal in the setting of diabetes. Is he taking the atorvastatin regularly? If not, he should. If he is, we could either add another medication for this or again recheck at his next visit and give him the time to change diet and exercise in the meantime

## 2019-08-12 NOTE — Telephone Encounter (Signed)
Patient advised as below.  

## 2019-08-13 ENCOUNTER — Telehealth: Payer: Self-pay | Admitting: Family Medicine

## 2019-08-13 NOTE — Chronic Care Management (AMB) (Signed)
°  Chronic Care Management   Outreach Note  08/13/2019 Name: [name] MRN: [id number] DOB: Jul 08, 1951  Referred by: Virginia Crews, MD Reason for referral : Chronic Care Management (Initial CCM outreach was unsuccessful. )   An unsuccessful telephone outreach was attempted today. The patient was referred to the case management team by for assistance with care management and care coordination.   Follow Up Plan: A HIPPA compliant phone message was left for the patient providing contact information and requesting a return call.  The care management team will reach out to the patient again over the next 7 days.  If patient returns call to provider office, please advise to call [place] [facility] [place]  [email or web address]   ??[phone]

## 2019-08-20 NOTE — Chronic Care Management (AMB) (Signed)
°  Chronic Care Management   Outreach Note  08/20/2019 Name: [name] MRN: [id number] DOB: [birth year]  Referred by: Stephen Crews, MD Reason for referral : Chronic Care Management (Initial CCM outreach was unsuccessful. ) and Chronic Care Management (Second CCM outreach was unsuccessful. )   A second unsuccessful telephone outreach was attempted today. The patient was referred to the case management team for assistance with care management and care coordination.   Follow Up Plan: A HIPPA compliant phone message was left for the patient providing contact information and requesting a return call.  The care management team will reach out to the patient again over the next 7 days.  If patient returns call to provider office, please advise to call [place] at [place]  [email or web address]   ??[phone]

## 2019-08-20 NOTE — Chronic Care Management (AMB) (Signed)
Chronic Care Management   Note  08/20/2019 Name: [name] MRN: [id number] DOB: 09-15-1951  [name] is a 68 y.o. year old male who is a primary care patient of Brita Romp, Dionne Bucy, MD. I reached out to [name] by phone today in response to a referral sent by [name] [facility] health plan.     [name] was given information about Chronic Care Management services today including:  1. CCM service includes personalized support from designated clinical staff supervised by his physician, including individualized plan of care and coordination with other care providers 2. 24/7 contact phone numbers for assistance for urgent and routine care needs. 3. Service will only be billed when office clinical staff spend 20 minutes or more in a month to coordinate care. 4. Only one practitioner may furnish and bill the service in a calendar month. 5. The patient may stop CCM services at any time (effective at the end of the month) by phone call to the office staff. 6. The patient will be responsible for cost sharing (co-pay) of up to 20% of the service fee (after annual deductible is met).  Patient did not agree to enrollment in care management services and does not wish to consider at this time.  Follow up plan: The patient has been provided with contact information for the chronic care management team and has been advised to call with any health related questions or concerns.   Thatcher  [email or web address]   ??[phone]

## 2019-08-25 ENCOUNTER — Other Ambulatory Visit: Payer: Self-pay | Admitting: Family Medicine

## 2019-08-25 DIAGNOSIS — M47816 Spondylosis without myelopathy or radiculopathy, lumbar region: Secondary | ICD-10-CM

## 2019-08-25 NOTE — Telephone Encounter (Signed)
Pt needing a refill on: °HYDROcodone-acetaminophen (NORCO/VICODIN) 5-325 MG tablet ° °Please fill at: ° °[id number] - [place], [place] - [address] [phone] (Phone) °[phone] (Fax)  ° °Thanks, °[facility] °

## 2019-08-26 MED ORDER — HYDROCODONE-ACETAMINOPHEN 5-325 MG PO TABS: 1.0000 | ORAL_TABLET | Freq: Four times a day (QID) | ORAL | 0 refills | Status: DC | PRN

## 2019-09-25 ENCOUNTER — Other Ambulatory Visit: Payer: Self-pay | Admitting: Family Medicine

## 2019-09-25 DIAGNOSIS — M47816 Spondylosis without myelopathy or radiculopathy, lumbar region: Secondary | ICD-10-CM

## 2019-09-25 NOTE — Telephone Encounter (Signed)
Requested medication (s) are due for refill today: yes  Requested medication (s) are on the active medication list: yes  Last refill: 08/26/2019  Future visit scheduled: no  Notes to clinic:  Refill cannot be delegated    Requested Prescriptions  Pending Prescriptions Disp Refills   HYDROcodone-acetaminophen (NORCO/VICODIN) 5-325 MG tablet 60 tablet 0    Sig: Take 1-2 tablets by mouth every 6 (six) hours as needed for moderate pain. Every 6 hours as needed for low back pain     Not Delegated - Analgesics:  Opioid Agonist Combinations Failed - 09/25/2019  3:23 PM      Failed - This refill cannot be delegated      Failed - Urine Drug Screen completed in last 360 days.      Passed - Valid encounter within last 6 months    Recent Outpatient Visits          1 month ago Type 2 diabetes mellitus with other specified complication, without long-term current use of insulin [facility])   [facility] [place], Dionne Bucy, MD   4 months ago Type 2 diabetes mellitus with other specified complication, without long-term current use of insulin [facility])   [facility] [place], Dionne Bucy, MD   6 months ago Type 2 diabetes mellitus with other specified complication, without long-term current use of insulin [facility])   [facility], Dionne Bucy, MD   7 months ago Type 2 diabetes mellitus with other specified complication, without long-term current use of insulin [facility])   [place], Dionne Bucy, MD   9 months ago Controlled type 2 diabetes mellitus without complication, without long-term current use of insulin [facility])   [place], [place], [place]

## 2019-09-25 NOTE — Telephone Encounter (Signed)
Medication Refill - Medication: HYDROcodone-acetaminophen (NORCO/VICODIN) 5-325 MG tablet  Has the patient contacted their pharmacy? no (Agent: If no, request that the patient contact the pharmacy for the refill.) (Agent: If yes, when and what did the pharmacy advise?)  Preferred Pharmacy (with phone number or street name):  [id number] [place], [place] - [place] [phone] (Phone) [phone] (Fax)   Agent: Please be advised that RX refills may take up to 3 business days. We ask that you follow-up with your pharmacy.

## 2019-09-26 MED ORDER — HYDROCODONE-ACETAMINOPHEN 5-325 MG PO TABS: 1.0000 | ORAL_TABLET | Freq: Four times a day (QID) | ORAL | 0 refills | Status: DC | PRN

## 2019-10-28 ENCOUNTER — Other Ambulatory Visit: Payer: Self-pay | Admitting: Family Medicine

## 2019-10-28 DIAGNOSIS — M47816 Spondylosis without myelopathy or radiculopathy, lumbar region: Secondary | ICD-10-CM

## 2019-10-28 NOTE — Telephone Encounter (Signed)
Pt request refill  HYDROcodone-acetaminophen (NORCO/VICODIN) 5-325 MG tablet  [id number] - [place], [place] - [address] Phone:  [phone]  Fax:  [phone]        

## 2019-10-28 NOTE — Telephone Encounter (Signed)
Requested medication (s) are due for refill today: yes  Requested medication (s) are on the active medication list: yes  Last refill:  09/26/2019  Future visit scheduled: no  Notes to clinic:  This refill cannot be delegated    Requested Prescriptions  Pending Prescriptions Disp Refills   HYDROcodone-acetaminophen (NORCO/VICODIN) 5-325 MG tablet 60 tablet 0    Sig: Take 1-2 tablets by mouth every 6 (six) hours as needed for moderate pain. Every 6 hours as needed for low back pain      Not Delegated - Analgesics:  Opioid Agonist Combinations Failed - 10/28/2019 12:22 PM      Failed - This refill cannot be delegated      Failed - Urine Drug Screen completed in last 360 days.      Passed - Valid encounter within last 6 months    Recent Outpatient Visits           2 months ago Type 2 diabetes mellitus with other specified complication, without long-term current use of insulin [facility])   [facility] [place], Marzella Schlein, MD   5 months ago Type 2 diabetes mellitus with other specified complication, without long-term current use of insulin [facility])   [facility] [place], Marzella Schlein, MD   7 months ago Type 2 diabetes mellitus with other specified complication, without long-term current use of insulin [facility])   [facility], Marzella Schlein, MD   9 months ago Type 2 diabetes mellitus with other specified complication, without long-term current use of insulin [facility])   [facility] [place], Marzella Schlein, MD   10 months ago Controlled type 2 diabetes mellitus without complication, without long-term current use of insulin [facility])   [facility] [place], [place], [place]

## 2019-10-29 MED ORDER — HYDROCODONE-ACETAMINOPHEN 5-325 MG PO TABS: 1.0000 | ORAL_TABLET | Freq: Four times a day (QID) | ORAL | 0 refills | Status: DC | PRN

## 2019-11-05 ENCOUNTER — Telehealth: Payer: Self-pay

## 2019-11-05 NOTE — Telephone Encounter (Signed)
Copied from CRM [id number]. Topic: General - Other >> Nov 05, 2019  3:34 PM Wyonia Hough E wrote: Reason for CRM: Pt called to see if he would need to fast for his CPE/AWV appt / Pt is diabetic and will not be able to go with anything to eat until 3pm/ please advise and advise if appt needs to be rescheduled

## 2019-11-06 ENCOUNTER — Other Ambulatory Visit: Payer: Self-pay | Admitting: Family Medicine

## 2019-11-06 DIAGNOSIS — I10 Essential (primary) hypertension: Secondary | ICD-10-CM

## 2019-11-06 MED ORDER — AMLODIPINE BESYLATE 5 MG PO TABS: 5.0000 mg | ORAL_TABLET | Freq: Every day | ORAL | 0 refills | Status: DC

## 2019-11-06 MED ORDER — EMPAGLIFLOZIN 25 MG PO TABS: 25.0000 mg | ORAL_TABLET | Freq: Every day | ORAL | 1 refills | Status: DC

## 2019-11-06 NOTE — Telephone Encounter (Addendum)
[facility] faxed refill request for the following medications:  empagliflozin (JARDIANCE) 25 MG TABS tablet amLODipine (NORVASC) 5 MG tablet  Please advise.  Thanks, [employer]

## 2019-11-13 ENCOUNTER — Ambulatory Visit: Payer: Self-pay | Admitting: Family Medicine

## 2019-11-13 IMAGING — CR DG LUMBAR SPINE 2-3V
1 series · 2 of 2 positions shown · non-contrast
Comparison: 06/13/2014

CLINICAL DATA: Fall 2 weeks ago with back and lower leg pain,
initial encounter

EXAM:
LUMBAR SPINE - 2 VIEW

[Series 1: dg lumbar spine 2-3 views · 0.14mm/px · 2 of 2 slices shown]
[im 1/2]
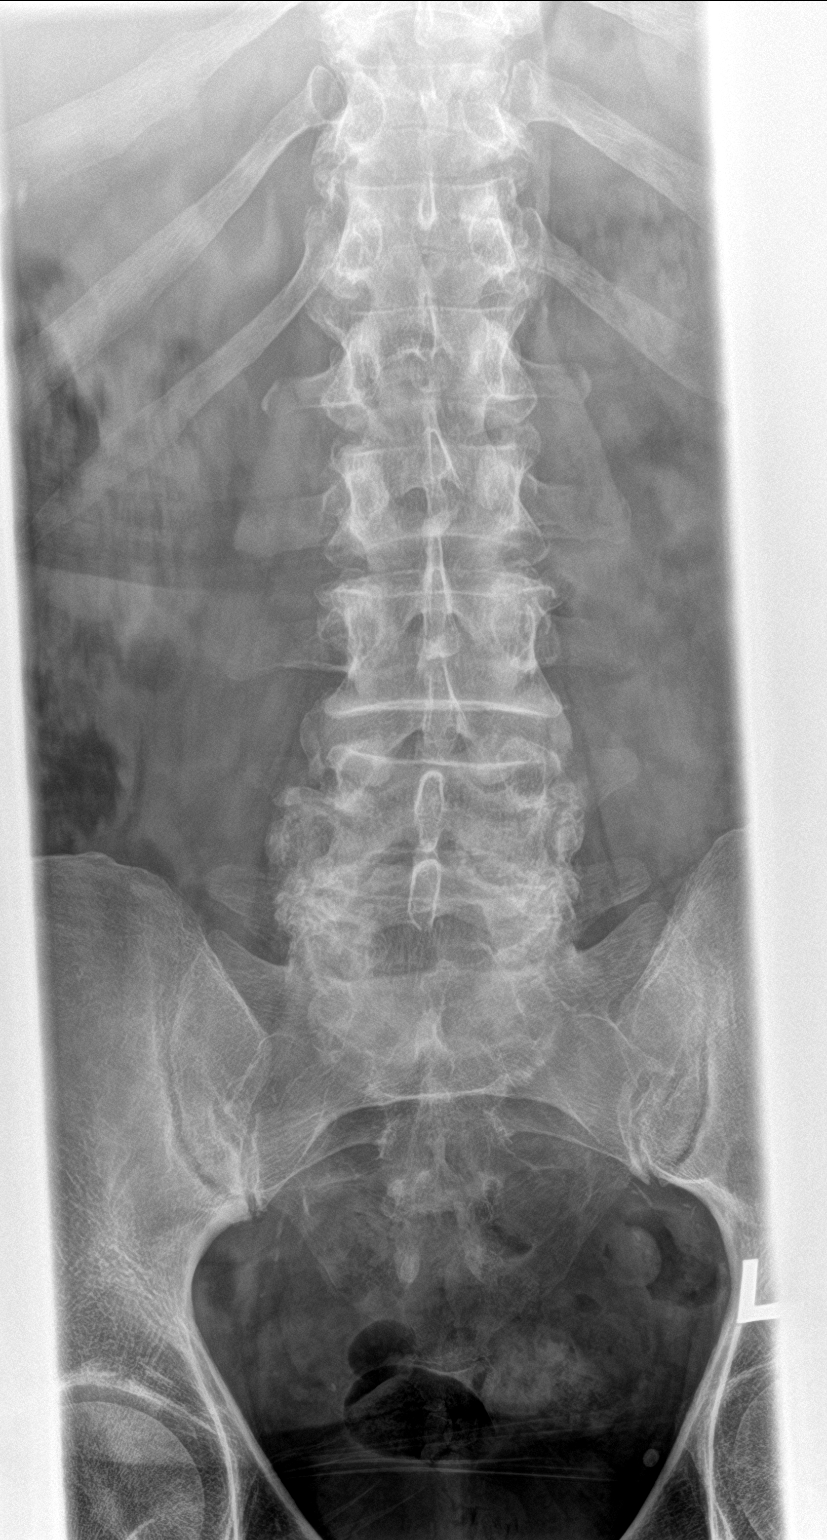
[im 2/2]
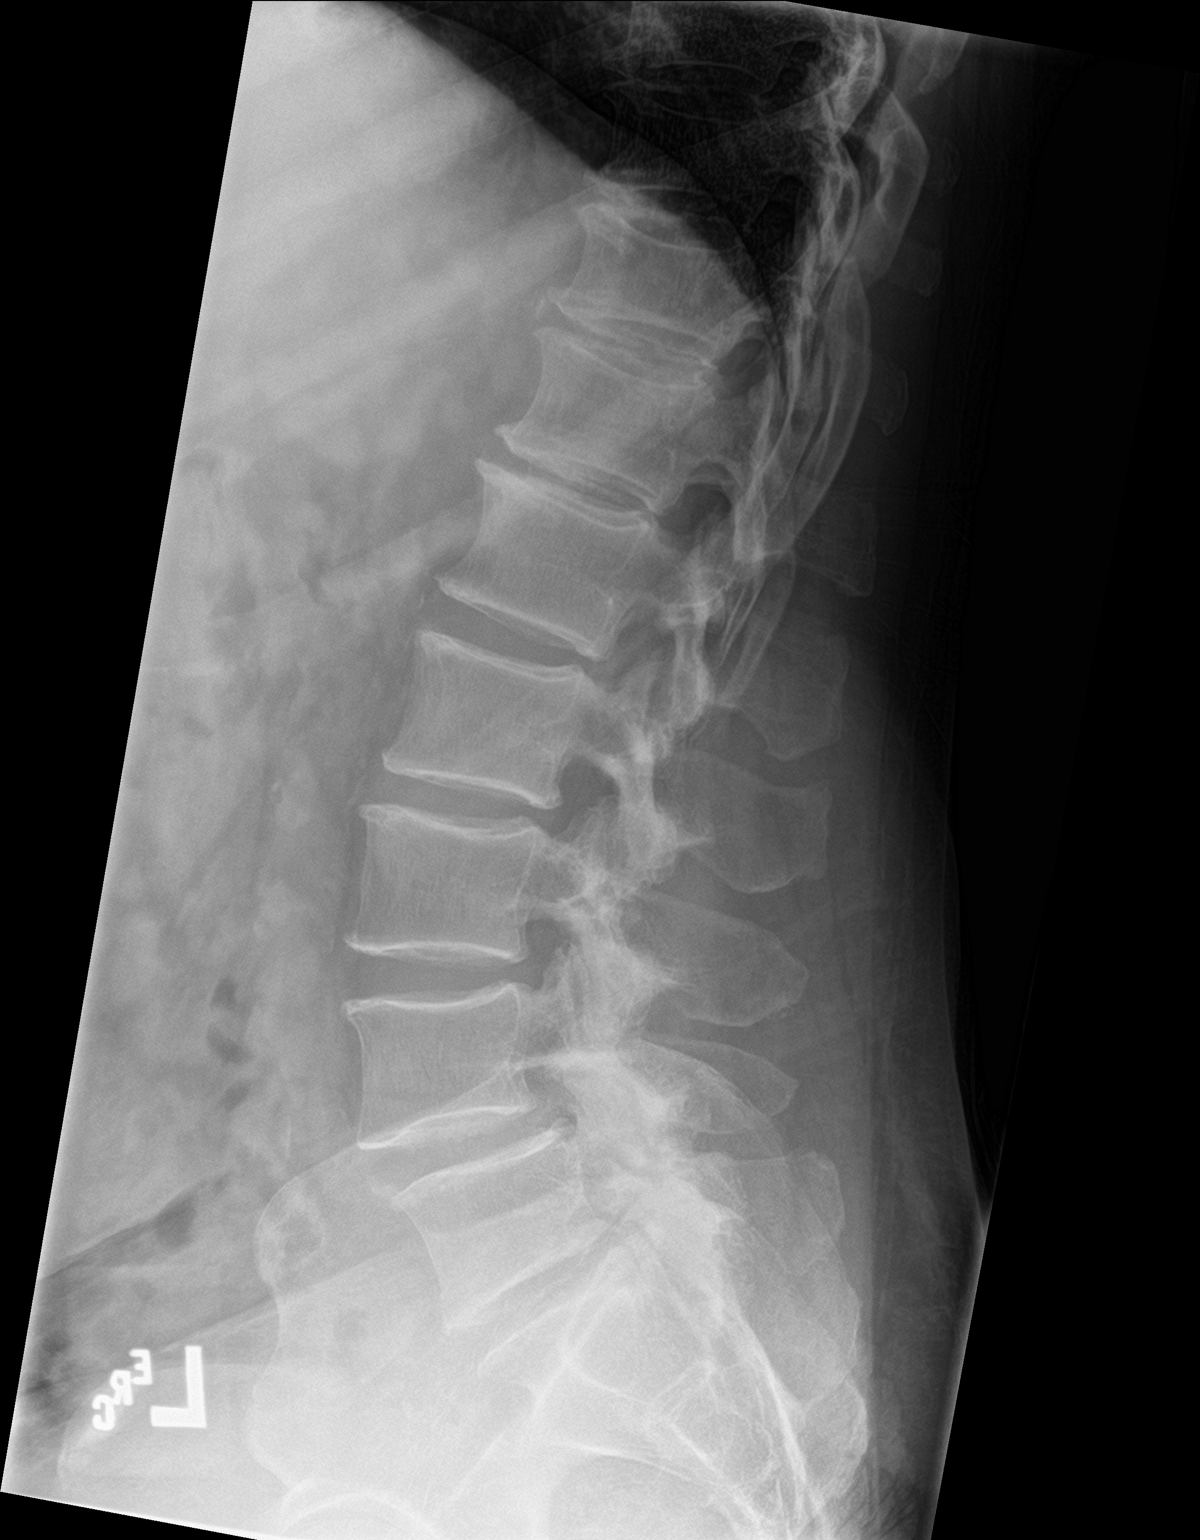

[2 of 2 positions shown; findings below may reference images not displayed]

FINDINGS: Five lumbar type vertebral bodies are well visualized. Vertebral
body height is well maintained. Mild anterolisthesis of L4 on L5 is
noted of a degenerative nature. This is stable from the prior exam.
No soft tissue abnormality is noted.
IMPRESSION: Degenerative changes without acute abnormality.

## 2019-11-13 IMAGING — CT CT CERVICAL SPINE W/O CM
3 of 7 series · 11 of 33 positions shown, 12 images · non-contrast
Comparison: None.

CLINICAL DATA: Fall with bilateral arm pain

EXAM:
CT HEAD WITHOUT CONTRAST
CT CERVICAL SPINE WITHOUT CONTRAST
TECHNIQUE: Multidetector CT imaging of the head and cervical spine was
performed following the standard protocol without intravenous
contrast. Multiplanar CT image reconstructions of the cervical spine
were also generated.

[Series 5: coronal soft tissue · coronal · 0.29mm/px · 3 of 67 slices shown]
[im 17/67  bone]
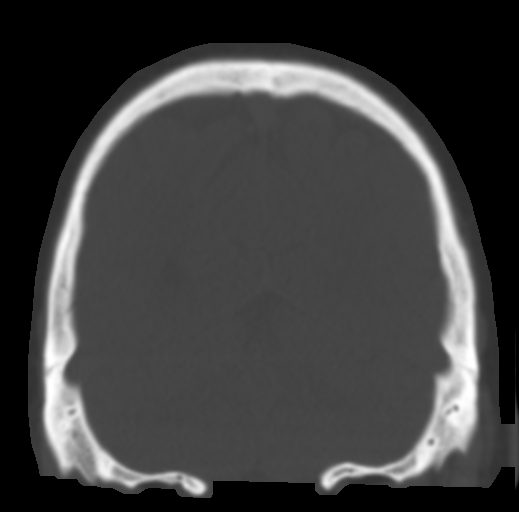
[im 34/67  bone]
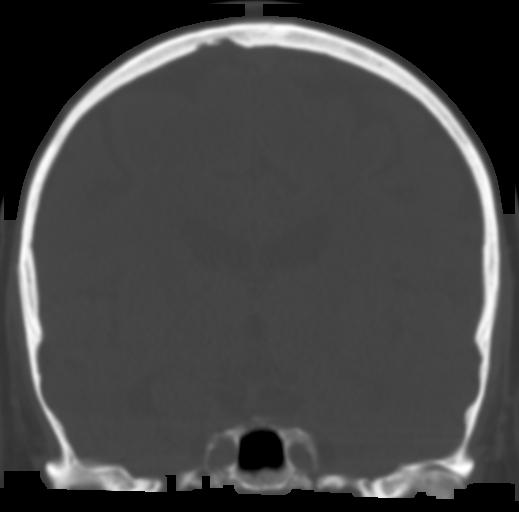
[im 50/67  bone]
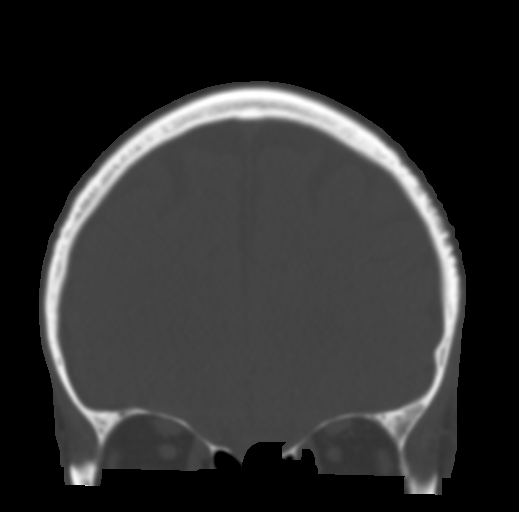

[Series 8: sagittal bone · sagittal · 0.22mm/px · 5 of 70 slices shown]
[im 10/70  bone]
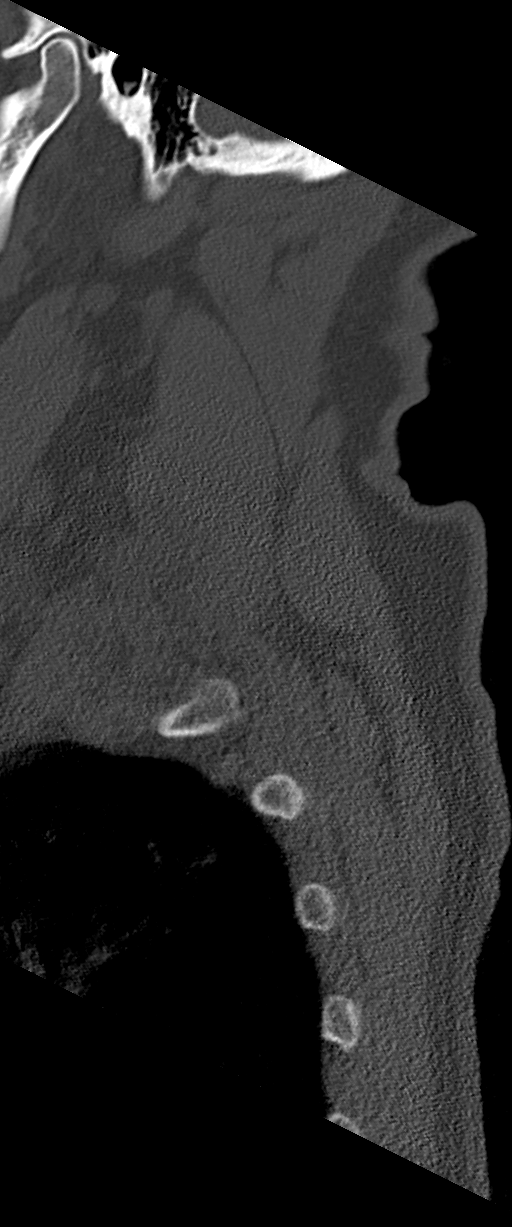
[im 20/70  bone]
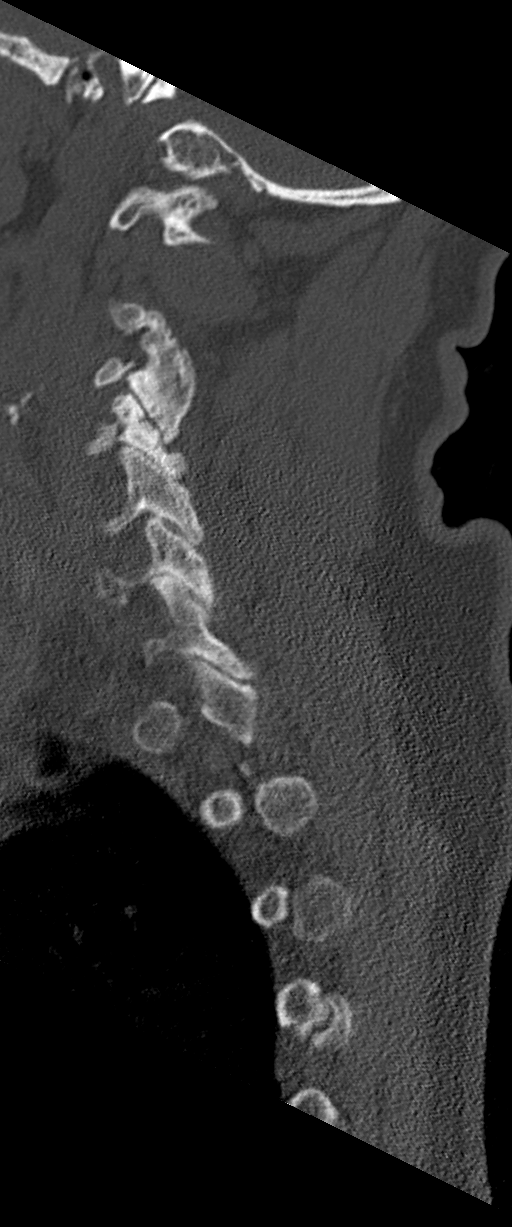
[im 30/70  bone]
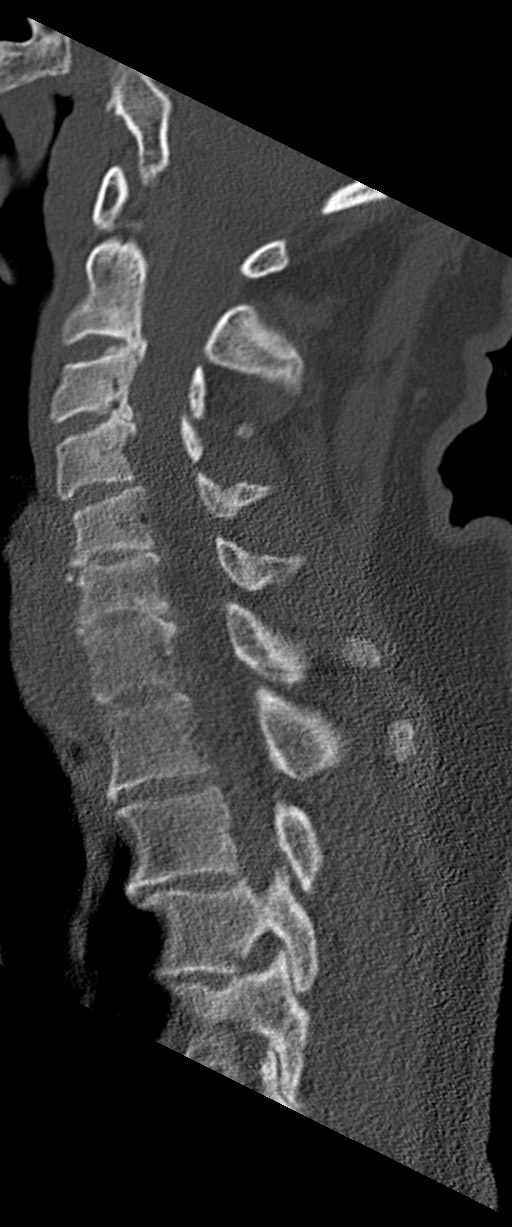
[im 40/70  bone]
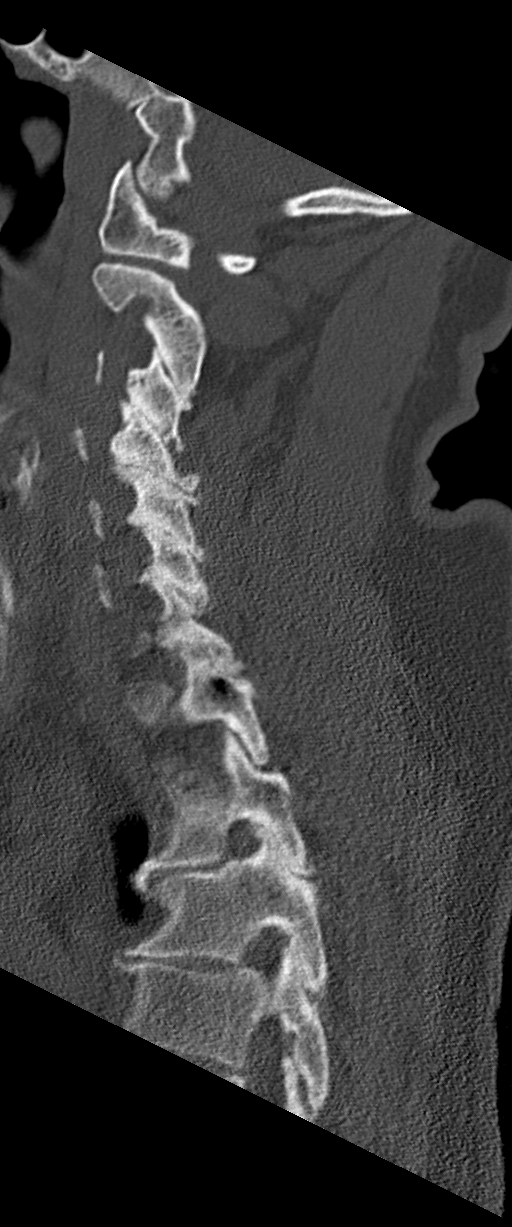
[im 50/70  bone]
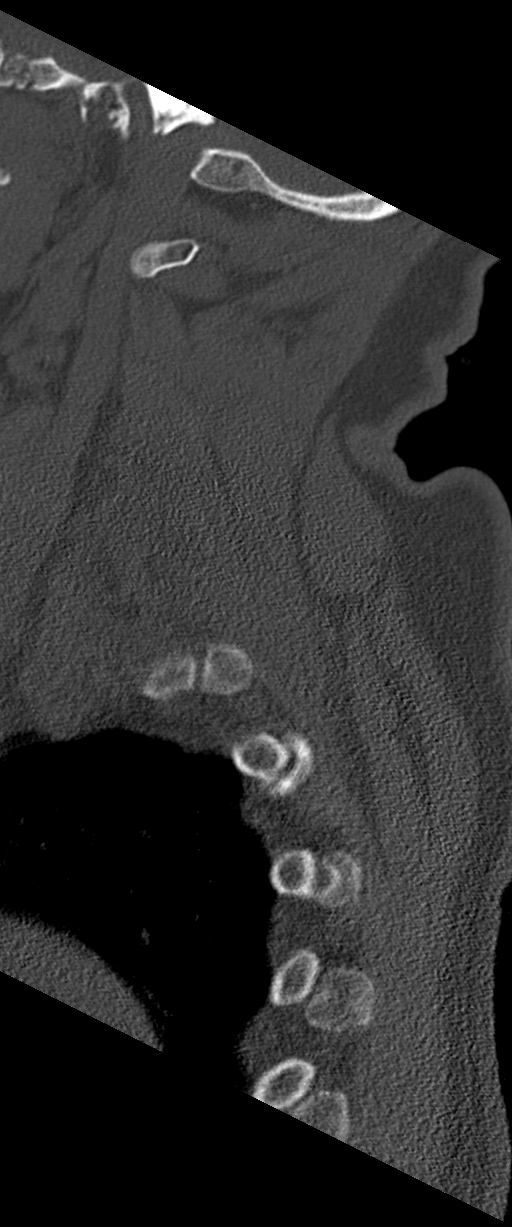

[Series 10: orthogonal bone · axial · 0.22mm/px · z∈[-366,-208]mm · 3 of 134 slices shown, 4 images]
[im 23/134  soft-tissue]
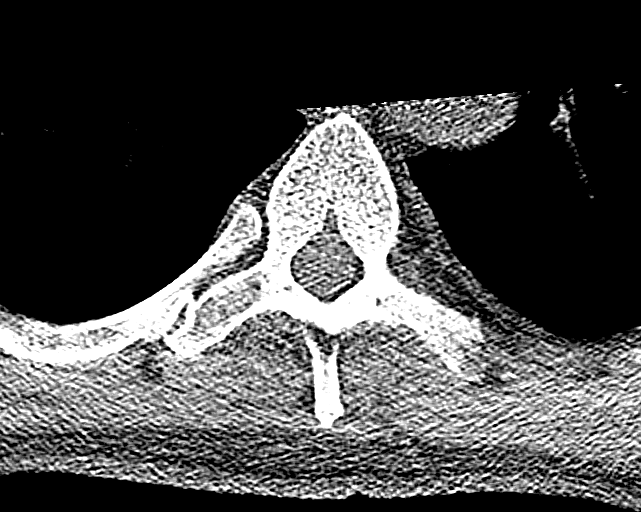
[im 23/134  bone]
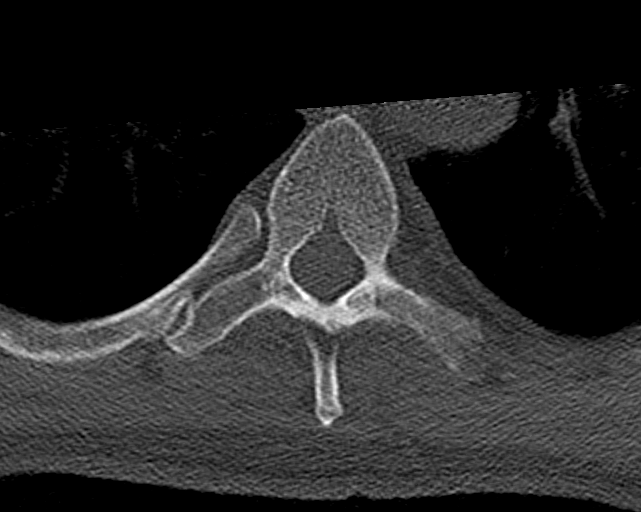
[im 67/134  bone]
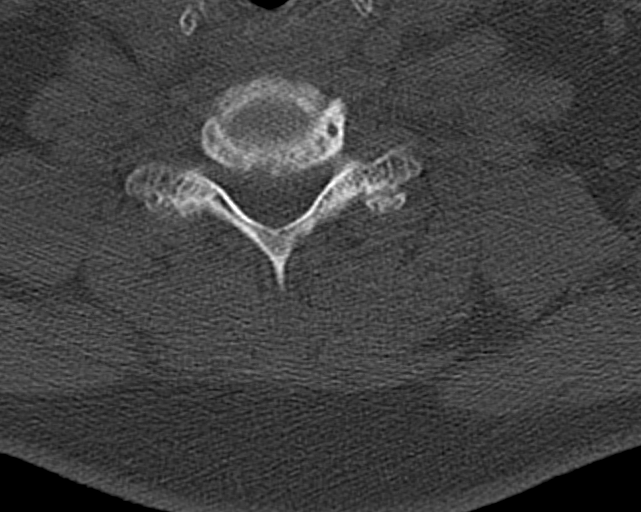
[im 111/134  bone]
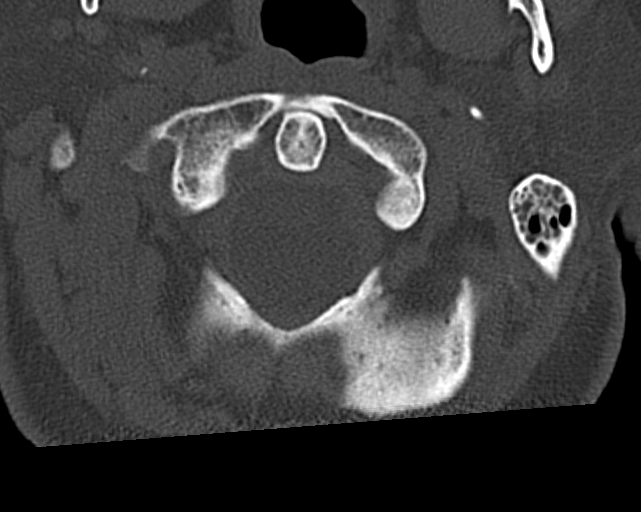

[11 of 33 positions shown; findings below may reference images not displayed]

FINDINGS: CT HEAD FINDINGS

Brain: No evidence of acute infarction, hemorrhage, hydrocephalus,
extra-axial collection or mass lesion/mass effect. Mild atrophy.
Minimal small vessel ischemic changes of the white matter.

Vascular: No hyperdense vessels. Scattered carotid artery
calcification

Skull: Normal. Negative for fracture or focal lesion.

Sinuses/Orbits: No acute finding.

Other: None

CT CERVICAL SPINE FINDINGS

Alignment: Trace anterolisthesis of C4 on C5, likely degenerative.
Facet alignment within normal limits.

Skull base and vertebrae: No acute fracture. No primary bone lesion
or focal pathologic process.

Soft tissues and spinal canal: No prevertebral fluid or swelling. No
visible canal hematoma.

Disc levels: Moderate-to-marked degenerative changes at C5-C6 and
C6-C7 with moderate degenerative changes at C4-C5 and mild
degenerative changes at C3-C4. Posterior disc osteophyte at C3-C4,
C5-C6 and C6-C7. Multiple level bilateral facet hypertrophic
arthropathy. Diffuse foraminal stenosis of the mid cervical spine.

Upper chest: Lung apices clear.  No thyroid mass.

Other: None
IMPRESSION: 1. No CT evidence for acute intracranial abnormality. Mild atrophy
and minimal small vessel ischemic changes of the white matter
2. Trace anterolisthesis of C4 on C5 likely degenerative. No
definite acute osseous abnormality is seen.

## 2019-11-24 ENCOUNTER — Other Ambulatory Visit: Payer: Self-pay | Admitting: Family Medicine

## 2019-11-24 MED ORDER — TRULICITY 1.5 MG/0.5ML ~~LOC~~ SOAJ: 1.5000 mg | SUBCUTANEOUS | 3 refills | Status: DC

## 2019-11-24 NOTE — Telephone Encounter (Signed)
[facility] faxed refill request for the following medications:  Dulaglutide (TRULICITY) 1.5 MG/0.5ML SOPN   Please advise.  Thanks, [employer]

## 2019-11-27 ENCOUNTER — Other Ambulatory Visit: Payer: Self-pay | Admitting: *Deleted

## 2019-11-27 ENCOUNTER — Other Ambulatory Visit: Payer: Self-pay | Admitting: Family Medicine

## 2019-11-27 DIAGNOSIS — M47816 Spondylosis without myelopathy or radiculopathy, lumbar region: Secondary | ICD-10-CM

## 2019-11-27 MED ORDER — HYDROCODONE-ACETAMINOPHEN 5-325 MG PO TABS: 1.0000 | ORAL_TABLET | Freq: Four times a day (QID) | ORAL | 0 refills | Status: DC | PRN

## 2019-11-27 NOTE — Telephone Encounter (Signed)
Medication Refill - Medication:  Dulaglutide (TRULICITY) 1.5 MG/0.5ML SOPN [[id number]] And hydrocodone    Has the patient contacted their pharmacy? No. (Agent: If no, request that the patient contact the pharmacy for the refill.) (Agent: If yes, when and what did the pharmacy advise?)  Preferred Pharmacy (with phone number or street name): [facility] [place]   Agent: Please be advised that RX refills may take up to 3 business days. We ask that you follow-up with your pharmacy.

## 2019-12-08 ENCOUNTER — Other Ambulatory Visit: Payer: Self-pay | Admitting: Family Medicine

## 2019-12-08 DIAGNOSIS — I10 Essential (primary) hypertension: Secondary | ICD-10-CM

## 2019-12-08 DIAGNOSIS — I2510 Atherosclerotic heart disease of native coronary artery without angina pectoris: Secondary | ICD-10-CM

## 2019-12-08 DIAGNOSIS — E1169 Type 2 diabetes mellitus with other specified complication: Secondary | ICD-10-CM

## 2019-12-08 DIAGNOSIS — K219 Gastro-esophageal reflux disease without esophagitis: Secondary | ICD-10-CM

## 2019-12-08 DIAGNOSIS — E119 Type 2 diabetes mellitus without complications: Secondary | ICD-10-CM

## 2019-12-08 MED ORDER — ATORVASTATIN CALCIUM 80 MG PO TABS: ORAL_TABLET | ORAL | 3 refills | Status: DC

## 2019-12-08 NOTE — Telephone Encounter (Addendum)
[facility] faxed refill request for the following medications:  atorvastatin (LIPITOR) 80 MG tablet hydrochlorothiazide (HYDRODIURIL) 25 MG tablet  metoprolol tartrate (LOPRESSOR) 50 MG tablet clopidogrel (PLAVIX) 75 MG tablet metFORMIN (GLUCOPHAGE) 1000 MG tablet fenofibrate (TRICOR) 145 MG tablet pantoprazole (PROTONIX) 40 MG tablet    Please advise.  Thanks, [employer]

## 2019-12-15 MED ORDER — FENOFIBRATE 145 MG PO TABS: 145.0000 mg | ORAL_TABLET | Freq: Every day | ORAL | 1 refills | Status: DC

## 2019-12-15 MED ORDER — METFORMIN HCL 1000 MG PO TABS: ORAL_TABLET | ORAL | 1 refills | Status: DC

## 2019-12-15 MED ORDER — METOPROLOL TARTRATE 50 MG PO TABS: 50.0000 mg | ORAL_TABLET | Freq: Two times a day (BID) | ORAL | 1 refills | Status: DC

## 2019-12-15 MED ORDER — CLOPIDOGREL BISULFATE 75 MG PO TABS: 75.0000 mg | ORAL_TABLET | Freq: Every day | ORAL | 1 refills | Status: DC

## 2019-12-15 MED ORDER — HYDROCHLOROTHIAZIDE 25 MG PO TABS: 25.0000 mg | ORAL_TABLET | Freq: Every day | ORAL | 1 refills | Status: DC

## 2019-12-15 MED ORDER — PANTOPRAZOLE SODIUM 40 MG PO TBEC: DELAYED_RELEASE_TABLET | ORAL | 1 refills | Status: DC

## 2019-12-15 NOTE — Addendum Note (Signed)
Addended by: Kavin Leech E on: 12/15/2019 11:26 AM   Modules accepted: Orders

## 2019-12-15 NOTE — Telephone Encounter (Signed)
We received a second fax requesting all of the medications that are in the message from 12/08/2019.  It looks like only atorvastatin (LIPITOR) 80 MG tablet was filled.    Please advise

## 2019-12-29 ENCOUNTER — Other Ambulatory Visit: Payer: Self-pay | Admitting: Family Medicine

## 2019-12-29 DIAGNOSIS — M47816 Spondylosis without myelopathy or radiculopathy, lumbar region: Secondary | ICD-10-CM

## 2019-12-29 NOTE — Telephone Encounter (Signed)
Requested medication (s) are due for refill today: yes  Requested medication (s) are on the active medication list: yes  Last refill:  11/27/19  Future visit scheduled: yes  Notes to clinic:  medication not delegated to NT to RF   Requested Prescriptions  Pending Prescriptions Disp Refills   HYDROcodone-acetaminophen (NORCO/VICODIN) 5-325 MG tablet 60 tablet 0    Sig: Take 1-2 tablets by mouth every 6 (six) hours as needed for moderate pain. Every 6 hours as needed for low back pain      Not Delegated - Analgesics:  Opioid Agonist Combinations Failed - 12/29/2019  1:22 PM      Failed - This refill cannot be delegated      Failed - Urine Drug Screen completed in last 360 days.      Passed - Valid encounter within last 6 months    Recent Outpatient Visits           4 months ago Type 2 diabetes mellitus with other specified complication, without long-term current use of insulin [facility])   [facility] [place], Marzella Schlein, MD   7 months ago Type 2 diabetes mellitus with other specified complication, without long-term current use of insulin [facility])   [facility], Marzella Schlein, MD   9 months ago Type 2 diabetes mellitus with other specified complication, without long-term current use of insulin [facility])   [facility], Marzella Schlein, MD   11 months ago Type 2 diabetes mellitus with other specified complication, without long-term current use of insulin [facility])   [facility] [place], Marzella Schlein, MD   1 year ago Controlled type 2 diabetes mellitus without complication, without long-term current use of insulin [facility])   [facility] [place], [place], [place]

## 2019-12-29 NOTE — Telephone Encounter (Signed)
Copied from CRM [id number]. Topic: Quick Communication - Rx Refill/Question >> Dec 29, 2019  1:18 PM Dalphine Handing A wrote: Medication:HYDROcodone-acetaminophen (NORCO/VICODIN) 5-325 MG tablet   Has the patient contacted their pharmacy? Yes (Agent: If no, request that the patient contact the pharmacy for the refill.) (Agent: If yes, when and what did the pharmacy advise?)Contact PCP  Preferred Pharmacy (with phone number or street name): [id number] [place], [place] - [address] AT [facility] [address] [facility]  Phone:  [phone] Fax:  [phone]     Agent: Please be advised that RX refills may take up to 3 business days. We ask that you follow-up with your pharmacy.

## 2019-12-30 MED ORDER — HYDROCODONE-ACETAMINOPHEN 5-325 MG PO TABS: 1.0000 | ORAL_TABLET | Freq: Four times a day (QID) | ORAL | 0 refills | Status: DC | PRN

## 2020-01-05 ENCOUNTER — Other Ambulatory Visit: Payer: Self-pay | Admitting: Family Medicine

## 2020-01-05 NOTE — Telephone Encounter (Signed)
[facility] faxed refill request for the following medications:   empagliflozin (JARDIANCE) 25 MG TABS tablet  Please advise.  Thanks, [employer]

## 2020-01-06 IMAGING — CR DG CHEST 2V
1 series · 2 of 2 positions shown · non-contrast
Comparison: None.

CLINICAL DATA: Hypertension. Preoperative for cervical spine
surgery.

EXAM:
CHEST - 2 VIEW

[Series 1: w chest pa · 0.14mm/px · 2 of 2 slices shown]
[im 1/2]
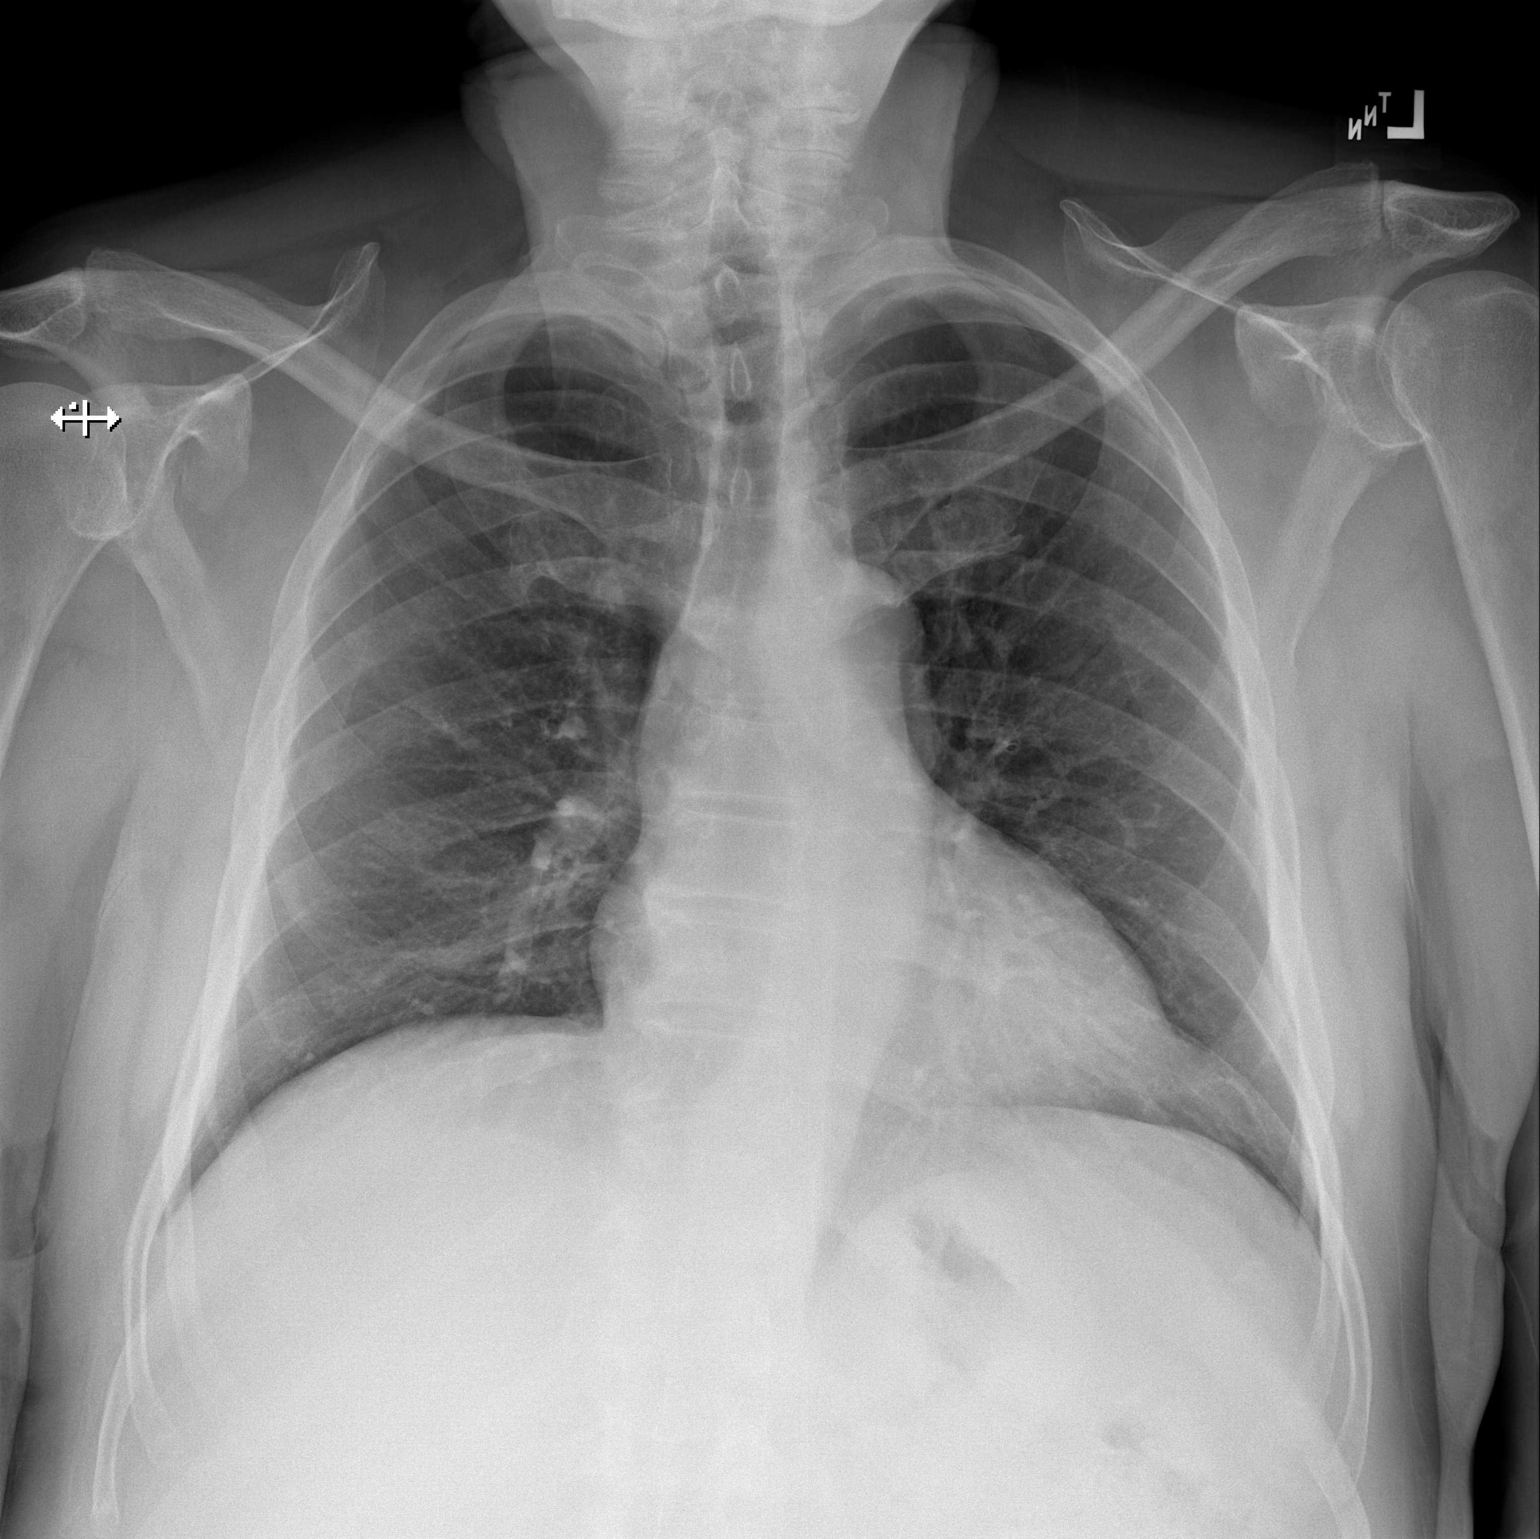
[im 2/2]
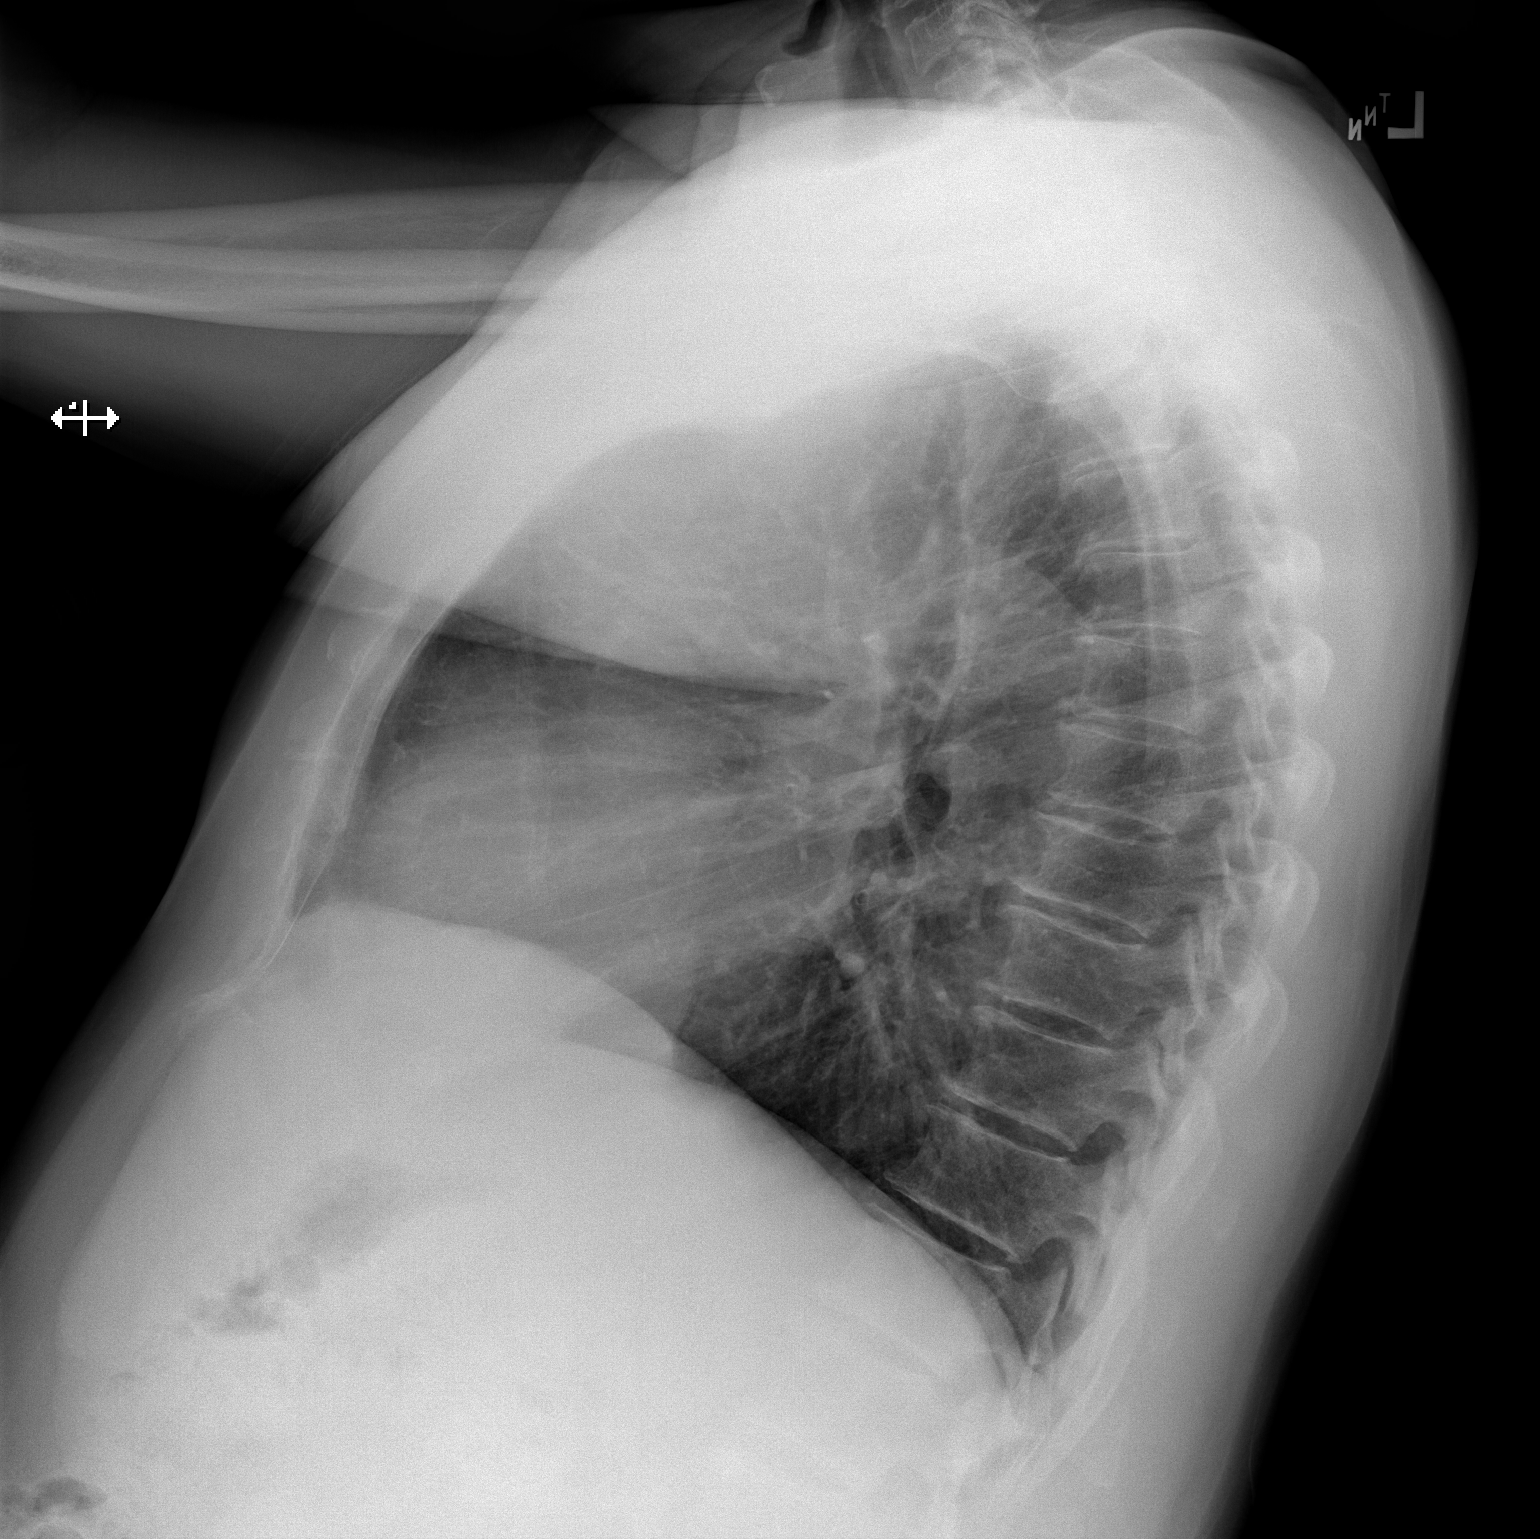

[2 of 2 positions shown; findings below may reference images not displayed]

FINDINGS: Normal heart size. Normal mediastinal contour. No pneumothorax. No
pleural effusion. Lungs appear clear, with no acute consolidative
airspace disease and no pulmonary edema.
IMPRESSION: No active cardiopulmonary disease.

## 2020-01-06 MED ORDER — EMPAGLIFLOZIN 25 MG PO TABS: 25.0000 mg | ORAL_TABLET | Freq: Every day | ORAL | 0 refills | Status: DC

## 2020-01-19 NOTE — Progress Notes (Signed)
Subjective:   [name] is a 69 y.o. male who presents for an Initial Medicare Annual Wellness Visit.    This visit is being conducted through telemedicine due to the COVID-19 pandemic. This patient has given me verbal consent via doximity to conduct this visit, patient states they are participating from their home address. Some vital signs may be absent or patient reported.    Patient identification: identified by name, DOB, and current address  Review of Systems  N/A  Cardiac Risk Factors include: advanced age (>66mn, >>37women);diabetes mellitus;dyslipidemia;hypertension;male gender;smoking/ tobacco exposure    Objective:    Today's Vitals   01/20/20 0926  PainSc: 0-No pain   There is no height or weight on file to calculate BMI. Unable to obtain vitals due to visit being conducted via telephonically.   Advanced Directives 01/20/2020 02/25/2018 02/25/2018 02/21/2018 02/21/2018 12/29/2017 05/01/2017  Does Patient Have a Medical Advance Directive? Yes - Yes - Yes Yes Yes  Type of Advance Directive [place] will - [place] will - [place]West University PlaceLiving will HCerro GordoLiving will [place] will  Does patient want to make changes to medical advance directive? - No - Patient declined - - No - Patient declined No - Patient declined No - Patient declined  Copy of [place] Chart? Yes - validated most recent copy scanned in chart (See row information) No - copy requested - Yes No - copy requested No - copy requested No - copy requested    Current Medications (verified) Outpatient Encounter Medications as of 01/20/2020  Medication Sig  . amLODipine (NORVASC) 5 MG tablet Take 1 tablet (5 mg total) by mouth daily.  .Marland Kitchenaspirin 81 MG tablet Take 81 mg by mouth daily.   .Marland Kitchenatorvastatin (LIPITOR) 80 MG tablet TAKE ONE (1) TABLET EACH DAY  . blood glucose meter kit and  supplies KIT Dispense based on patient and insurance preference. Check sugars once daily  . Calcium Citrate-Vitamin D (CALCIUM + D PO) Take 1 tablet by mouth daily.  . clopidogrel (PLAVIX) 75 MG tablet Take 1 tablet (75 mg total) by mouth daily.  . Dulaglutide (TRULICITY) 1.5 MFK/8.1EXSOPN Inject 1.5 mg into the skin once a week.  . empagliflozin (JARDIANCE) 25 MG TABS tablet Take 25 mg by mouth daily.  .Marland Kitchenezetimibe (ZETIA) 10 MG tablet Take 1 tablet (10 mg total) by mouth daily.  .Marland Kitchenglucose blood (ONETOUCH VERIO) test strip TEST BLOOD GLUCOSE EVERY DAY  . hydrochlorothiazide (HYDRODIURIL) 25 MG tablet Take 1 tablet (25 mg total) by mouth daily.  .Marland KitchenHYDROcodone-acetaminophen (NORCO/VICODIN) 5-325 MG tablet Take 1-2 tablets by mouth every 6 (six) hours as needed for moderate pain. Every 6 hours as needed for low back pain  . lisinopril (ZESTRIL) 20 MG tablet Take 1 tablet (20 mg total) by mouth daily.  . metFORMIN (GLUCOPHAGE) 1000 MG tablet TAKE ONE (1) TABLET BY MOUTH TWO (2) TIMES DAILY WITH FOOD  . metoprolol tartrate (LOPRESSOR) 50 MG tablet Take 1 tablet (50 mg total) by mouth 2 (two) times daily.  . pantoprazole (PROTONIX) 40 MG tablet TAKE ONE (1) TABLET BY MOUTH EVERY DAY AS NEEDED FOR HEARTBURN OR ACID REFLUX  . Potassium Gluconate 2.5 MEQ TABS Take by mouth daily.   . traZODone (DESYREL) 150 MG tablet Take 1 tablet (150 mg total) by mouth at bedtime as needed for sleep.  .Marland Kitchendocusate sodium (COLACE) 100 MG capsule Take  1 capsule (100 mg total) by mouth 2 (two) times daily. (Patient not taking: Reported on 05/05/2019)  . fenofibrate (TRICOR) 145 MG tablet Take 1 tablet (145 mg total) by mouth daily.  Marland Kitchen senna (SENOKOT) 8.6 MG TABS tablet Take 1 tablet (8.6 mg total) by mouth 2 (two) times daily. (Patient not taking: Reported on 01/20/2020)   No facility-administered encounter medications on file as of 01/20/2020.    Allergies (verified) Amoxicillin and Fexofenadine   History: Past Medical  History:  Diagnosis Date  . Arthritis    osteoarthriis of back  . Coronary artery disease   . Depression   . Diabetes mellitus without complication ([place])   . GERD (gastroesophageal reflux disease)   . Hyperlipidemia   . Hypertension    Past Surgical History:  Procedure Laterality Date  . CATARACT EXTRACTION W/PHACO Right 03/01/2017   Procedure: CATARACT EXTRACTION PHACO AND INTRAOCULAR LENS PLACEMENT (IOC);  Surgeon: Eulogio Bear, MD;  Location: [facility];  Service: Ophthalmology;  Laterality: Right;  [place] 22.6 AP% 6.1 CDE 1.37 Fluid pack lot # [id number]  . CATARACT EXTRACTION W/PHACO Left 05/01/2017   Procedure: CATARACT EXTRACTION PHACO AND INTRAOCULAR LENS PLACEMENT (IOC)  Left Diabetic;  Surgeon: Eulogio Bear, MD;  Location: [place];  Service: Ophthalmology;  Laterality: Left;  Block Diabetic  . CORONARY ANGIOPLASTY     STENT 2009  . POSTERIOR CERVICAL LAMINECTOMY N/A 02/25/2018   Procedure: POSTERIOR CERVICAL LAMINECTOMY-CERVICAL 3;  Surgeon: Kathlene November, MD;  Location: [facility];  Service: Neurosurgery;  Laterality: N/A;  . Stint  2007   Family History  Problem Relation Age of Onset  . Diabetes Mother        mellitus type2   Social History   Socioeconomic History  . Marital status: Divorced    Spouse name: Not on file  . Number of children: 2  . Years of education: Not on file  . Highest education level: 9th grade  Occupational History  . Occupation: retired  Tobacco Use  . Smoking status: Current Every Day Smoker    Types: Cigars  . Smokeless tobacco: Never Used  . Tobacco comment: smokes cigars-about 10-15 cigars a week, quit cigarettes 20+ years ago  Substance and Sexual Activity  . Alcohol use: No  . Drug use: No  . Sexual activity: Not on file  Other Topics Concern  . Not on file  Social History Narrative  . Not on file   Social Determinants of Health   Financial Resource Strain: Low Risk   . Difficulty of Paying Living  Expenses: Not hard at all  Food Insecurity: No Food Insecurity  . Worried About Charity fundraiser in the Last Year: Never true  . Ran Out of Food in the Last Year: Never true  Transportation Needs: No Transportation Needs  . Lack of Transportation (Medical): No  . Lack of Transportation (Non-Medical): No  Physical Activity: Insufficiently Active  . Days of Exercise per Week: 2 days  . Minutes of Exercise per Session: 30 min  Stress: No Stress Concern Present  . Feeling of Stress : Not at all  Social Connections: Somewhat Isolated  . Frequency of Communication with Friends and Family: More than three times a week  . Frequency of Social Gatherings with Friends and Family: More than three times a week  . Attends Religious Services: More than 4 times per year  . Active Member of Clubs or Organizations: No  . Attends Archivist Meetings: Never  .  Marital Status: Divorced   Tobacco Counseling Ready to quit: Not Answered Counseling given: Not Answered Comment: smokes cigars-about 10-15 cigars a week, quit cigarettes 20+ years ago   Clinical Intake:  Pre-visit preparation completed: Yes  Pain : No/denies pain Pain Score: 0-No pain     Nutritional Risks: None Diabetes: Yes  How often do you need to have someone help you when you read instructions, pamphlets, or other written materials from your doctor or pharmacy?: 5 - Always(Due to scar damage in both eyes.)   Diabetes:  Is the patient diabetic?  Yes  If diabetic, was a CBG obtained today?  No  Did the patient bring in their glucometer from home?  No  How often do you monitor your CBG's? Once a day in the morning.   Financial Strains and Diabetes Management:  Are you having any financial strains with the device, your supplies or your medication? No .  Does the patient want to be seen by Chronic Care Management for management of their diabetes?  No  Would the patient like to be referred to a Nutritionist or for  Diabetic Management?  No   Diabetic Exams:  Diabetic Eye Exam: Completed 07/17/19. Repeat yearly.  Diabetic Foot Exam: Completed 08/04/19. Repeat yearly.  Interpreter Needed?: No  Information entered by :: [facility], LPN  Activities of Daily Living In your present state of health, do you have any difficulty performing the following activities: 01/20/2020 03/10/2019  Hearing? N N  Vision? 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 Does not wear eye glasses. -  Difficulty concentrating or making decisions? N N  Walking or climbing stairs? N N  Dressing or bathing? N N  Doing errands, shopping? Y N  Comment Does not drive. -  Preparing Food and eating ? N -  Using the Toilet? N -  In the past six months, have you accidently leaked urine? N -  Do you have problems with loss of bowel control? N -  Managing your Medications? N -  Managing your Finances? N -  Housekeeping or managing your Housekeeping? N -  Some recent data might be hidden     Immunizations and Health Maintenance Immunization History  Administered Date(s) Administered  . Fluad Quad(high Dose 65+) 08/04/2019  . Influenza, High Dose Seasonal PF 09/06/2015, 09/08/2016, 09/21/2017, 06/26/2018  . Influenza-Unspecified 06/23/2014  . Pneumococcal Conjugate-13 06/04/2015  . Pneumococcal Polysaccharide-23 09/21/2017  . Tdap 09/27/2012  . Zoster 09/06/2015   There are no preventive care reminders to display for this patient.  Patient Care Team: Virginia Crews, MD as PCP - General (Family Medicine) Isaias Cowman, MD as Consulting Physician (Cardiology) Eulogio Bear, MD as Consulting Physician (Ophthalmology)  Indicate any recent Medical Services you may have received from other than [facility] providers in the past year (date may be approximate).    Assessment:   This is a routine wellness examination for [place].  Hearing/Vision screen No exam data present  Dietary issues and exercise activities  discussed: Current Exercise Habits: Home exercise routine, Type of exercise: walking, Time (Minutes): 30(to 45 minutes at a time.), Frequency (Times/Week): 2, Weekly Exercise (Minutes/Week): 60, Intensity: Mild, Exercise limited by: None identified  Goals      Patient Stated   . Weight (lb) < 185 lb (83.9 kg) (pt-stated)     Recommend to cut back on sugar intake and sweets in diet to help aid with weight loss.       Depression Screen  PHQ 2/9 Scores 01/20/2020 05/05/2019 09/21/2017 09/06/2015  PHQ - 2 Score 0 0 0 0  PHQ- 9 Score - 0 2 -    Fall Risk Fall Risk  01/20/2020 03/10/2019 09/21/2017 09/06/2015  Falls in the past year? 0 1 No Yes  Comment - - - patient states that he tripped on his shoe laces  Number falls in past yr: 0 1 - 1  Injury with Fall? 0 0 - Yes  Comment - - - had soe back for a few days  Follow up - - - Falls prevention discussed    FALL RISK PREVENTION PERTAINING TO THE HOME:  Any stairs in or around the home? Yes  If so, are there any without handrails? No   Home free of loose throw rugs in walkways, pet beds, electrical cords, etc? Yes  Adequate lighting in your home to reduce risk of falls? Yes   ASSISTIVE DEVICES UTILIZED TO PREVENT FALLS:  Life alert? No  Use of a cane, walker or w/c? No  Grab bars in the bathroom? Yes  Shower chair or bench in shower? Yes  Elevated toilet seat or a handicapped toilet? No    TIMED UP AND GO:  Was the test performed? No .    Cognitive Function:     6CIT Screen 01/20/2020  What Year? 0 points  What month? 0 points  What time? 0 points  Count back from 20 2 points  Months in reverse 2 points  Repeat phrase 6 points  Total Score 10    Screening Tests Health Maintenance  Topic Date Due  . HEMOGLOBIN A1C  02/02/2020  . OPHTHALMOLOGY EXAM  07/16/2020  . FOOT EXAM  08/03/2020  . Fecal DNA (Cologuard)  10/29/2020  . TETANUS/TDAP  09/27/2022  . INFLUENZA VACCINE  Completed  . Hepatitis C Screening   Completed  . PNA vac Low Risk Adult  Completed    Qualifies for Shingles Vaccine? Yes  Zostavax completed 09/06/15. Due for Shingrix. Pt has been advised to call insurance company to determine out of pocket expense. Advised may also receive vaccine at local pharmacy or Health Dept. Verbalized acceptance and understanding.  Tdap: Up to date  Flu Vaccine: Up to date  Pneumococcal Vaccine: Completed series  Cancer Screenings:  Colorectal Screening: Cologuard completed 10/29/17. Repeat every 3 years.   Lung Cancer Screening: (Low Dose CT Chest recommended if Age 66-80 years, 30 pack-year currently smoking OR have quit w/in 15years.) does not qualify.   Additional Screening:  Hepatitis C Screening: Up to date  Dental Screening: Recommended annual dental exams for proper oral hygiene  Community Resource Referral:  CRR required this visit?  No        Plan:  I have personally reviewed and addressed the Medicare Annual Wellness questionnaire and have noted the following in the patient's chart:  A. Medical and social history B. Use of alcohol, tobacco or illicit drugs  C. Current medications and supplements D. Functional ability and status E.  Nutritional status F.  Physical activity G. Advance directives H. List of other physicians I.  Hospitalizations, surgeries, and ER visits in previous 12 months J.  [employer] such as hearing and vision if needed, cognitive and depression L. Referrals and appointments   In addition, I have reviewed and discussed with patient certain preventive protocols, quality metrics, and best practice recommendations. A written personalized care plan for preventive services as well as general preventive health recommendations were provided to patient.  Glendora Score, LPN   4/48/3015  Nurse Health Advisor    Nurse Notes: None.

## 2020-01-20 ENCOUNTER — Other Ambulatory Visit: Payer: Self-pay

## 2020-01-20 ENCOUNTER — Ambulatory Visit (INDEPENDENT_AMBULATORY_CARE_PROVIDER_SITE_OTHER): Payer: PPO

## 2020-01-20 DIAGNOSIS — Z0000 Encounter for general adult medical examination without abnormal findings: Secondary | ICD-10-CM

## 2020-01-20 NOTE — Patient Instructions (Signed)
[name] , Thank you for taking time to come for your Medicare Wellness Visit. I appreciate your ongoing commitment to your health goals. Please review the following plan we discussed and let me know if I can assist you in the future.   Screening recommendations/referrals: Colonoscopy: Cologuard up to date, due 70/04/2021 Recommended yearly ophthalmology/optometry visit for glaucoma screening and checkup Recommended yearly dental visit for hygiene and checkup  Vaccinations: Influenza vaccine: Up to date Pneumococcal vaccine: Completed series Tdap vaccine: Up to date Shingles vaccine: Pt declines today.     Advanced directives: Currently on file.  Conditions/risks identified: Recommend to cut back on sugar intake and sweets in diet to help aid with weight loss.   Next appointment: 01/22/20 @ 10:00 AM with Dr Beryle Flock   Preventive Care 70 Years and Older, Male Preventive care refers to lifestyle choices and visits with your health care provider that can promote health and wellness. What does preventive care include?  A yearly physical exam. This is also called an annual well check.  Dental exams once or twice a year.  Routine eye exams. Ask your health care provider how often you should have your eyes checked.  Personal lifestyle choices, including:  Daily care of your teeth and gums.  Regular physical activity.  Eating a healthy diet.  Avoiding tobacco and drug use.  Limiting alcohol use.  Practicing safe sex.  Taking low doses of aspirin every day.  Taking vitamin and mineral supplements as recommended by your health care provider. What happens during an annual well check? The services and screenings done by your health care provider during your annual well check will depend on your age, overall health, lifestyle risk factors, and family history of disease. Counseling  Your health care provider may ask you questions about your:  Alcohol use.  Tobacco use.  Drug  use.  Emotional well-being.  Home and relationship well-being.  Sexual activity.  Eating habits.  History of falls.  Memory and ability to understand (cognition).  Work and work Astronomer. Screening  You may have the following tests or measurements:  Height, weight, and BMI.  Blood pressure.  Lipid and cholesterol levels. These may be checked every 5 years, or more frequently if you are over 52 years old.  Skin check.  Lung cancer screening. You may have this screening every year starting at age 70 if you have a 30-pack-year history of smoking and currently smoke or have quit within the past 15 years.  Fecal occult blood test (FOBT) of the stool. You may have this test every year starting at age 70.  Flexible sigmoidoscopy or colonoscopy. You may have a sigmoidoscopy every 5 years or a colonoscopy every 10 years starting at age 70.  Prostate cancer screening. Recommendations will vary depending on your family history and other risks.  Hepatitis C blood test.  Hepatitis B blood test.  Sexually transmitted disease (STD) testing.  Diabetes screening. This is done by checking your blood sugar (glucose) after you have not eaten for a while (fasting). You may have this done every 1-3 years.  Abdominal aortic aneurysm (AAA) screening. You may need this if you are a current or former smoker.  Osteoporosis. You may be screened starting at age 70 if you are at high risk. Talk with your health care provider about your test results, treatment options, and if necessary, the need for more tests. Vaccines  Your health care provider may recommend certain vaccines, such as:  Influenza vaccine. This is 70 recommended every year.  Tetanus, diphtheria, and acellular pertussis (Tdap, Td) vaccine. You may need a Td booster every 10 years.  Zoster vaccine. You may need this after age 70.  Pneumococcal 13-valent conjugate (PCV13) vaccine. One dose is recommended after age  70.  Pneumococcal polysaccharide (PPSV23) vaccine. One dose is recommended after age 70. Talk to your health care provider about which screenings and vaccines you need and how often you need them. This information is not intended to replace advice given to you by your health care provider. Make sure you discuss any questions you have with your health care provider. Document Released: 11/05/2015 Document Revised: 06/28/2016 Document Reviewed: 08/10/2015 Elsevier Interactive Patient Education  2017 [place] Prevention in the Home Falls can cause injuries. They can happen to people of all ages. There are many things you can do to make your home safe and to help prevent falls. What can I do on the outside of my home?  Regularly fix the edges of walkways and driveways and fix any cracks.  Remove anything that might make you trip as you walk through a door, such as a raised step or threshold.  Trim any bushes or trees on the path to your home.  Use bright outdoor lighting.  Clear any walking paths of anything that might make someone trip, such as rocks or tools.  Regularly check to see if handrails are loose or broken. Make sure that both sides of any steps have handrails.  Any raised decks and porches should have guardrails on the edges.  Have any leaves, snow, or ice cleared regularly.  Use sand or salt on walking paths during winter.  Clean up any spills in your garage right away. This includes oil or grease spills. What can I do in the bathroom?  Use night lights.  Install grab bars by the toilet and in the tub and shower. Do not use towel bars as grab bars.  Use non-skid mats or decals in the tub or shower.  If you need to sit down in the shower, use a plastic, non-slip stool.  Keep the floor dry. Clean up any water that spills on the floor as soon as it happens.  Remove soap buildup in the tub or shower regularly.  Attach bath mats securely with double-sided  non-slip rug tape.  Do not have throw rugs and other things on the floor that can make you trip. What can I do in the bedroom?  Use night lights.  Make sure that you have a light by your bed that is easy to reach.  Do not use any sheets or blankets that are too big for your bed. They should not hang down onto the floor.  Have a firm chair that has side arms. You can use this for support while you get dressed.  Do not have throw rugs and other things on the floor that can make you trip. What can I do in the kitchen?  Clean up any spills right away.  Avoid walking on wet floors.  Keep items that you use a lot in easy-to-reach places.  If you need to reach something above you, use a strong step stool that has a grab bar.  Keep electrical cords out of the way.  Do not use floor polish or wax that makes floors slippery. If you must use wax, use non-skid floor wax.  Do not have throw rugs and other things on the floor that can make you trip.  What can I do with my stairs?  Do not leave any items on the stairs.  Make sure that there are handrails on both sides of the stairs and use them. Fix handrails that are broken or loose. Make sure that handrails are as long as the stairways.  Check any carpeting to make sure that it is firmly attached to the stairs. Fix any carpet that is loose or worn.  Avoid having throw rugs at the top or bottom of the stairs. If you do have throw rugs, attach them to the floor with carpet tape.  Make sure that you have a light switch at the top of the stairs and the bottom of the stairs. If you do not have them, ask someone to add them for you. What else can I do to help prevent falls?  Wear shoes that:  Do not have high heels.  Have rubber bottoms.  Are comfortable and fit you well.  Are closed at the toe. Do not wear sandals.  If you use a stepladder:  Make sure that it is fully opened. Do not climb a closed stepladder.  Make sure that both  sides of the stepladder are locked into place.  Ask someone to hold it for you, if possible.  Clearly mark and make sure that you can see:  Any grab bars or handrails.  First and last steps.  Where the edge of each step is.  Use tools that help you move around (mobility aids) if they are needed. These include:  Canes.  Walkers.  Scooters.  Crutches.  Turn on the lights when you go into a dark area. Replace any light bulbs as soon as they burn out.  Set up your furniture so you have a clear path. Avoid moving your furniture around.  If any of your floors are uneven, fix them.  If there are any pets around you, be aware of where they are.  Review your medicines with your doctor. Some medicines can make you feel dizzy. This can increase your chance of falling. Ask your doctor what other things that you can do to help prevent falls. This information is not intended to replace advice given to you by your health care provider. Make sure you discuss any questions you have with your health care provider. Document Released: 08/05/2009 Document Revised: 03/16/2016 Document Reviewed: 11/13/2014 Elsevier Interactive Patient Education  2017 [employer].

## 2020-01-22 ENCOUNTER — Other Ambulatory Visit: Payer: Self-pay

## 2020-01-22 ENCOUNTER — Encounter: Payer: Self-pay | Admitting: Family Medicine

## 2020-01-22 ENCOUNTER — Ambulatory Visit (INDEPENDENT_AMBULATORY_CARE_PROVIDER_SITE_OTHER): Payer: PPO | Admitting: Family Medicine

## 2020-01-22 VITALS — BP 136/80 | HR 76 | Temp 97.3°F | Ht 72.0 in | Wt 205.0 lb

## 2020-01-22 DIAGNOSIS — I10 Essential (primary) hypertension: Secondary | ICD-10-CM

## 2020-01-22 DIAGNOSIS — M47816 Spondylosis without myelopathy or radiculopathy, lumbar region: Secondary | ICD-10-CM

## 2020-01-22 DIAGNOSIS — E785 Hyperlipidemia, unspecified: Secondary | ICD-10-CM

## 2020-01-22 DIAGNOSIS — Z0000 Encounter for general adult medical examination without abnormal findings: Secondary | ICD-10-CM | POA: Diagnosis not present

## 2020-01-22 DIAGNOSIS — E663 Overweight: Secondary | ICD-10-CM | POA: Diagnosis not present

## 2020-01-22 DIAGNOSIS — E1169 Type 2 diabetes mellitus with other specified complication: Secondary | ICD-10-CM | POA: Diagnosis not present

## 2020-01-22 DIAGNOSIS — E669 Obesity, unspecified: Secondary | ICD-10-CM | POA: Insufficient documentation

## 2020-01-22 MED ORDER — TRULICITY 1.5 MG/0.5ML ~~LOC~~ SOAJ: 1.5000 mg | SUBCUTANEOUS | 3 refills | Status: DC

## 2020-01-22 MED ORDER — HYDROCODONE-ACETAMINOPHEN 5-325 MG PO TABS: 1.0000 | ORAL_TABLET | Freq: Four times a day (QID) | ORAL | 0 refills | Status: DC | PRN

## 2020-01-22 NOTE — Assessment & Plan Note (Signed)
With associated chronic pain Continue Norco at current dose Database reviewed

## 2020-01-22 NOTE — Assessment & Plan Note (Signed)
Discussed importance of healthy weight management Discussed diet and exercise  

## 2020-01-22 NOTE — Progress Notes (Signed)
Patient: [name], Male    DOB: [birth year], 69 y.o.   MRN: [id number] Visit Date: 01/22/2020  Today's Provider: Lavon Paganini, MD   Chief Complaint  Patient presents with  . Annual Exam   Subjective:     Complete Physical [name] is a 69 y.o. male. He feels well. He reports exercising regularly. He reports he is sleeping poorly.  -----------------------------------------------------------   Review of Systems  Constitutional: Negative.   HENT: Negative.   Eyes: Negative.   Respiratory: Negative.   Cardiovascular: Positive for leg swelling. Negative for chest pain and palpitations.  Gastrointestinal: Negative.   Endocrine: Negative.   Genitourinary: Negative.   Musculoskeletal: Positive for back pain. Negative for arthralgias, gait problem, joint swelling, myalgias, neck pain and neck stiffness.  Skin: Negative.   Allergic/Immunologic: Negative.   Neurological: Negative.   Hematological: Negative.   Psychiatric/Behavioral: Negative.     Social History   Socioeconomic History  . Marital status: Divorced    Spouse name: Not on file  . Number of children: 2  . Years of education: Not on file  . Highest education level: 9th grade  Occupational History  . Occupation: retired  Tobacco Use  . Smoking status: Current Every Day Smoker    Types: Cigars  . Smokeless tobacco: Never Used  . Tobacco comment: smokes cigars-about 10-15 cigars a week, quit cigarettes 20+ years ago  Substance and Sexual Activity  . Alcohol use: No  . Drug use: No  . Sexual activity: Not on file  Other Topics Concern  . Not on file  Social History Narrative  . Not on file   Social Determinants of Health   Financial Resource Strain: Low Risk   . Difficulty of Paying Living Expenses: Not hard at all  Food Insecurity: No Food Insecurity  . Worried About Charity fundraiser in the Last Year: Never true  . Ran Out of Food in the Last Year: Never true  Transportation Needs:  No Transportation Needs  . Lack of Transportation (Medical): No  . Lack of Transportation (Non-Medical): No  Physical Activity: Insufficiently Active  . Days of Exercise per Week: 2 days  . Minutes of Exercise per Session: 30 min  Stress: No Stress Concern Present  . Feeling of Stress : Not at all  Social Connections: Somewhat Isolated  . Frequency of Communication with Friends and Family: More than three times a week  . Frequency of Social Gatherings with Friends and Family: More than three times a week  . Attends Religious Services: More than 4 times per year  . Active Member of Clubs or Organizations: No  . Attends Archivist Meetings: Never  . Marital Status: Divorced  Human resources officer Violence: Not At Risk  . Fear of Current or Ex-Partner: No  . Emotionally Abused: No  . Physically Abused: No  . Sexually Abused: No    Past Medical History:  Diagnosis Date  . Arthritis    osteoarthriis of back  . Coronary artery disease   . Depression   . Diabetes mellitus without complication ([place])   . GERD (gastroesophageal reflux disease)   . Hyperlipidemia   . Hypertension      Patient Active Problem List   Diagnosis Date Noted  . Spondylosis of lumbar region without myelopathy or radiculopathy 03/08/2016  . Arteriosclerosis of coronary artery 06/04/2015  . S/P coronary artery stent placement 06/04/2015  . CAD in native artery 06/04/2015  . Essential (primary) hypertension  05/28/2015  . Hyperlipidemia associated with type 2 diabetes mellitus ([place]) 05/28/2015  . Insomnia 05/28/2015  . 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 Gastro-esophageal reflux disease without esophagitis 05/28/2015  . T2DM (type 2 diabetes mellitus) ([place]) 05/28/2015    Past Surgical History:  Procedure Laterality Date  . CATARACT EXTRACTION W/PHACO Right 03/01/2017   Procedure: CATARACT EXTRACTION PHACO AND INTRAOCULAR LENS PLACEMENT (IOC);  Surgeon: Eulogio Bear, MD;  Location: [facility];   Service: Ophthalmology;  Laterality: Right;  [place] 22.6 AP% 6.1 CDE 1.37 Fluid pack lot # [id number]  . CATARACT EXTRACTION W/PHACO Left 05/01/2017   Procedure: CATARACT EXTRACTION PHACO AND INTRAOCULAR LENS PLACEMENT (IOC)  Left Diabetic;  Surgeon: Eulogio Bear, MD;  Location: [place];  Service: Ophthalmology;  Laterality: Left;  Block Diabetic  . CORONARY ANGIOPLASTY     STENT 2009  . POSTERIOR CERVICAL LAMINECTOMY N/A 02/25/2018   Procedure: POSTERIOR CERVICAL LAMINECTOMY-CERVICAL 3;  Surgeon: Kathlene November, MD;  Location: [facility];  Service: Neurosurgery;  Laterality: N/A;  . Stint  2007    His family history includes Diabetes in his mother.   Current Outpatient Medications:  .  amLODipine (NORVASC) 5 MG tablet, Take 1 tablet (5 mg total) by mouth daily., Disp: 90 tablet, Rfl: 0 .  aspirin 81 MG tablet, Take 81 mg by mouth daily. , Disp: , Rfl:  .  atorvastatin (LIPITOR) 80 MG tablet, TAKE ONE (1) TABLET EACH DAY, Disp: 90 tablet, Rfl: 3 .  blood glucose meter kit and supplies KIT, Dispense based on patient and insurance preference. Check sugars once daily, Disp: 1 each, Rfl: 0 .  Calcium Citrate-Vitamin D (CALCIUM + D PO), Take 1 tablet by mouth daily., Disp: , Rfl:  .  clopidogrel (PLAVIX) 75 MG tablet, Take 1 tablet (75 mg total) by mouth daily., Disp: 90 tablet, Rfl: 1 .  Dulaglutide (TRULICITY) 1.5 TL/5.7WI SOPN, Inject 1.5 mg into the skin once a week., Disp: 2 mL, Rfl: 3 .  empagliflozin (JARDIANCE) 25 MG TABS tablet, Take 25 mg by mouth daily., Disp: 30 tablet, Rfl: 0 .  ezetimibe (ZETIA) 10 MG tablet, Take 1 tablet (10 mg total) by mouth daily., Disp: 90 tablet, Rfl: 3 .  fenofibrate (TRICOR) 145 MG tablet, Take 1 tablet (145 mg total) by mouth daily., Disp: 90 tablet, Rfl: 1 .  glucose blood (ONETOUCH VERIO) test strip, TEST BLOOD GLUCOSE EVERY DAY, Disp: 50 each, Rfl: 5 .  hydrochlorothiazide (HYDRODIURIL) 25 MG tablet, Take 1 tablet (25 mg total) by mouth  daily., Disp: 90 tablet, Rfl: 1 .  HYDROcodone-acetaminophen (NORCO/VICODIN) 5-325 MG tablet, Take 1-2 tablets by mouth every 6 (six) hours as needed for moderate pain. Every 6 hours as needed for low back pain, Disp: 60 tablet, Rfl: 0 .  metFORMIN (GLUCOPHAGE) 1000 MG tablet, TAKE ONE (1) TABLET BY MOUTH TWO (2) TIMES DAILY WITH FOOD, Disp: 180 tablet, Rfl: 1 .  metoprolol tartrate (LOPRESSOR) 50 MG tablet, Take 1 tablet (50 mg total) by mouth 2 (two) times daily., Disp: 180 tablet, Rfl: 1 .  pantoprazole (PROTONIX) 40 MG tablet, TAKE ONE (1) TABLET BY MOUTH EVERY DAY AS NEEDED FOR HEARTBURN OR ACID REFLUX, Disp: 90 tablet, Rfl: 1 .  Potassium Gluconate 2.5 MEQ TABS, Take by mouth daily. , Disp: , Rfl:  .  docusate sodium (COLACE) 100 MG capsule, Take 1 capsule (100 mg total) by mouth 2 (two) times daily. (Patient not taking: Reported on 05/05/2019), Disp: 10 capsule,  Rfl: 0 .  lisinopril (ZESTRIL) 20 MG tablet, Take 1 tablet (20 mg total) by mouth daily., Disp: 90 tablet, Rfl: 3 .  senna (SENOKOT) 8.6 MG TABS tablet, Take 1 tablet (8.6 mg total) by mouth 2 (two) times daily. (Patient not taking: Reported on 01/20/2020), Disp: 10 each, Rfl: 0 .  traZODone (DESYREL) 150 MG tablet, Take 1 tablet (150 mg total) by mouth at bedtime as needed for sleep. (Patient not taking: Reported on 01/22/2020), Disp: 30 tablet, Rfl: 5  Patient Care Team: Virginia Crews, MD as PCP - General (Family Medicine) Isaias Cowman, MD as Consulting Physician (Cardiology) Eulogio Bear, MD as Consulting Physician (Ophthalmology)     Objective:    Vitals: BP 136/80 (BP Location: Right Arm, Patient Position: Sitting, Cuff Size: Normal)   Pulse 76   Temp (!) 97.3 F (36.3 C) (Temporal)   Ht 6' (1.829 m)   Wt 205 lb (93 kg)   BMI 27.80 kg/m   Physical Exam Vitals reviewed.  Constitutional:      General: He is not in acute distress.    Appearance: Normal appearance. He is well-developed. He is not  diaphoretic.  HENT:     Head: Normocephalic and atraumatic.     Right Ear: Tympanic membrane, ear canal and external ear normal.     Left Ear: Tympanic membrane, ear canal and external ear normal.     Nose: Nose normal.     Mouth/Throat:     Mouth: Mucous membranes are moist.     Pharynx: Oropharynx is clear. No oropharyngeal exudate.  Eyes:     General: No scleral icterus.    Conjunctiva/sclera: Conjunctivae normal.     Pupils: Pupils are equal, round, and reactive to light.  Neck:     Thyroid: No thyromegaly.  Cardiovascular:     Rate and Rhythm: Normal rate and regular rhythm.     Heart sounds: Normal heart sounds. No murmur.  Pulmonary:     Effort: Pulmonary effort is normal. No respiratory distress.     Breath sounds: Normal breath sounds. No wheezing or rales.  Abdominal:     General: There is no distension.     Palpations: Abdomen is soft.     Tenderness: There is no abdominal tenderness. There is no guarding or rebound.  Musculoskeletal:        General: No deformity.     Cervical back: Neck supple.     Right lower leg: No edema.     Left lower leg: No edema.  Lymphadenopathy:     Cervical: No cervical adenopathy.  Skin:    General: Skin is warm and dry.     Findings: No rash.  Neurological:     Mental Status: He is alert and oriented to person, place, and time. Mental status is at baseline.  Psychiatric:        Mood and Affect: Mood normal.        Behavior: Behavior normal.        Thought Content: Thought content normal.     Activities of Daily Living In your present state of health, do you have any difficulty performing the following activities: 01/20/2020 03/10/2019  Hearing? N N  Vision? 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 Does not wear eye glasses. -  Difficulty concentrating or making decisions? N N  Walking or climbing stairs? N N  Dressing or bathing? N N  Doing errands, shopping? Y N  Comment Does not  drive. -  Preparing Food and eating ?  N -  Using the Toilet? N -  In the past six months, have you accidently leaked urine? N -  Do you have problems with loss of bowel control? N -  Managing your Medications? N -  Managing your Finances? N -  Housekeeping or managing your Housekeeping? N -  Some recent data might be hidden    Fall Risk Assessment Fall Risk  01/20/2020 03/10/2019 09/21/2017 09/06/2015  Falls in the past year? 0 1 No Yes  Comment - - - patient states that he tripped on his shoe laces  Number falls in past yr: 0 1 - 1  Injury with Fall? 0 0 - Yes  Comment - - - had soe back for a few days  Follow up - - - Falls prevention discussed     Depression Screen PHQ 2/9 Scores 01/20/2020 05/05/2019 09/21/2017 09/06/2015  PHQ - 2 Score 0 0 0 0  PHQ- 9 Score - 0 2 -    6CIT Screen 01/20/2020  What Year? 0 points  What month? 0 points  What time? 0 points  Count back from 20 2 points  Months in reverse 2 points  Repeat phrase 6 points  Total Score 10       Assessment & Plan:    Annual Physical Reviewed patient's Family Medical History Reviewed and updated list of patient's medical providers Assessment of cognitive impairment was done Assessed patient's functional ability Established a written schedule for health screening [place] Completed and Reviewed  Exercise Activities and Dietary recommendations Goals    . Weight (lb) < 185 lb (83.9 kg) (pt-stated)     Recommend to cut back on sugar intake and sweets in diet to help aid with weight loss.        Immunization History  Administered Date(s) Administered  . Fluad Quad(high Dose 65+) 08/04/2019  . Influenza, High Dose Seasonal PF 09/06/2015, 09/08/2016, 09/21/2017, 06/26/2018  . Influenza-Unspecified 06/23/2014  . Pneumococcal Conjugate-13 06/04/2015  . Pneumococcal Polysaccharide-23 09/21/2017  . Tdap 09/27/2012  . Zoster 09/06/2015    Health Maintenance  Topic Date Due  . HEMOGLOBIN A1C  02/02/2020  . INFLUENZA  VACCINE  05/23/2020  . OPHTHALMOLOGY EXAM  07/16/2020  . FOOT EXAM  08/03/2020  . Fecal DNA (Cologuard)  10/29/2020  . TETANUS/TDAP  09/27/2022  . Hepatitis C Screening  Completed  . PNA vac Low Risk Adult  Completed     Discussed health benefits of physical activity, and encouraged him to engage in regular exercise appropriate for his age and condition.    ------------------------------------------------------------------------------------------------------------  Problem List Items Addressed This Visit      Cardiovascular and Mediastinum   Essential (primary) hypertension    Well controlled Continue current medications Recheck metabolic panel F/u in 6 months       Relevant Orders   Comprehensive metabolic panel     Endocrine   Hyperlipidemia associated with type 2 diabetes mellitus ([place])    Continue statin Recheck FLP and CMP      Relevant Medications   Dulaglutide (TRULICITY) 1.5 NO/6.7EH SOPN   Other Relevant Orders   Comprehensive metabolic panel   Lipid panel   T2DM (type 2 diabetes mellitus) ([place])    Previously was slightly elevated A1c of 7.4 Associated with CAD, HTN, HLD Continue metformin, Jardiance, Trulicity at current doses On ACE inhibitor and statin Up-to-date on vaccinations and screenings Recheck A1c      Relevant  Medications   Dulaglutide (TRULICITY) 1.5 ZT/2.4PY SOPN   Other Relevant Orders   Hemoglobin A1c     Musculoskeletal and Integument   Spondylosis of lumbar region without myelopathy or radiculopathy    With associated chronic pain Continue Norco at current dose Database reviewed      Relevant Medications   HYDROcodone-acetaminophen (NORCO/VICODIN) 5-325 MG tablet   HYDROcodone-acetaminophen (NORCO) 5-325 MG tablet   HYDROcodone-acetaminophen (NORCO) 5-325 MG tablet     Other   Overweight    Discussed importance of healthy weight management Discussed diet and exercise       Other Visit Diagnoses    Encounter for annual  physical exam    -  Primary   Relevant Orders   Hemoglobin A1c   Comprehensive metabolic panel   Lipid panel       Return in about 6 months (around 07/23/2020) for chronic disease f/u.   The entirety of the information documented in the History of Present Illness, Review of Systems and Physical Exam were personally obtained by me. Portions of this information were initially documented by Ashley Royalty, CMA and reviewed by me for thoroughness and accuracy.    Elaine Roanhorse, Dionne Bucy, MD MPH [facility]

## 2020-01-22 NOTE — Patient Instructions (Signed)
The CDC recommends two doses of Shingrix (the shingles vaccine) separated by 2 to 6 months for adults age 70 years and older. I recommend checking with your insurance plan regarding coverage for this vaccine.     Preventive Care 35 Years and Older, Male Preventive care refers to lifestyle choices and visits with your health care provider that can promote health and wellness. This includes:  A yearly physical exam. This is also called an annual well check.  Regular dental and eye exams.  Immunizations.  Screening for certain conditions.  Healthy lifestyle choices, such as diet and exercise. What can I expect for my preventive care visit? Physical exam Your health care provider will check:  Height and weight. These may be used to calculate body mass index (BMI), which is a measurement that tells if you are at a healthy weight.  Heart rate and blood pressure.  Your skin for abnormal spots. Counseling Your health care provider may ask you questions about:  Alcohol, tobacco, and drug use.  Emotional well-being.  Home and relationship well-being.  Sexual activity.  Eating habits.  History of falls.  Memory and ability to understand (cognition).  Work and work Statistician. What immunizations do I need?  Influenza (flu) vaccine  This is recommended every year. Tetanus, diphtheria, and pertussis (Tdap) vaccine  You may need a Td booster every 10 years. Varicella (chickenpox) vaccine  You may need this vaccine if you have not already been vaccinated. Zoster (shingles) vaccine  You may need this after age 25. Pneumococcal conjugate (PCV13) vaccine  One dose is recommended after age 60. Pneumococcal polysaccharide (PPSV23) vaccine  One dose is recommended after age 79. Measles, mumps, and rubella (MMR) vaccine  You may need at least one dose of MMR if you were born in 1957 or later. You may also need a second dose. Meningococcal conjugate (MenACWY)  vaccine  You may need this if you have certain conditions. Hepatitis A vaccine  You may need this if you have certain conditions or if you travel or work in places where you may be exposed to hepatitis A. Hepatitis B vaccine  You may need this if you have certain conditions or if you travel or work in places where you may be exposed to hepatitis B. Haemophilus influenzae type b (Hib) vaccine  You may need this if you have certain conditions. You may receive vaccines as individual doses or as more than one vaccine together in one shot (combination vaccines). Talk with your health care provider about the risks and benefits of combination vaccines. What tests do I need? Blood tests  Lipid and cholesterol levels. These may be checked every 5 years, or more frequently depending on your overall health.  Hepatitis C test.  Hepatitis B test. Screening  Lung cancer screening. You may have this screening every year starting at age 28 if you have a 30-pack-year history of smoking and currently smoke or have quit within the past 15 years.  Colorectal cancer screening. All adults should have this screening starting at age 87 and continuing until age 71. Your health care provider may recommend screening at age 15 if you are at increased risk. You will have tests every 1-10 years, depending on your results and the type of screening test.  Prostate cancer screening. Recommendations will vary depending on your family history and other risks.  Diabetes screening. This is done by checking your blood sugar (glucose) after you have not eaten for a while (fasting). You may  have this done every 1-3 years.  Abdominal aortic aneurysm (AAA) screening. You may need this if you are a current or former smoker.  Sexually transmitted disease (STD) testing. Follow these instructions at home: Eating and drinking  Eat a diet that includes fresh fruits and vegetables, whole grains, lean protein, and low-fat dairy  products. Limit your intake of foods with high amounts of sugar, saturated fats, and salt.  Take vitamin and mineral supplements as recommended by your health care provider.  Do not drink alcohol if your health care provider tells you not to drink.  If you drink alcohol: ? Limit how much you have to 0-2 drinks a day. ? Be aware of how much alcohol is in your drink. In the [place] one drink equals one 12 oz bottle of beer (355 mL), one 5 oz glass of wine (148 mL), or one 1 oz glass of hard liquor (44 mL). Lifestyle  Take daily care of your teeth and gums.  Stay active. Exercise for at least 30 minutes on 5 or more days each week.  Do not use any products that contain nicotine or tobacco, such as cigarettes, e-cigarettes, and chewing tobacco. If you need help quitting, ask your health care provider.  If you are sexually active, practice safe sex. Use a condom or other form of protection to prevent STIs (sexually transmitted infections).  Talk with your health care provider about taking a low-dose aspirin or statin. What's next?  Visit your health care provider once a year for a well check visit.  Ask your health care provider how often you should have your eyes and teeth checked.  Stay up to date on all vaccines. This information is not intended to replace advice given to you by your health care provider. Make sure you discuss any questions you have with your health care provider. Document Revised: 10/03/2018 Document Reviewed: 10/03/2018 Elsevier Patient Education  2020 [employer].

## 2020-01-22 NOTE — Assessment & Plan Note (Signed)
Well controlled Continue current medications Recheck metabolic panel F/u in 6 months  

## 2020-01-22 NOTE — Assessment & Plan Note (Signed)
Previously was slightly elevated A1c of 7.4 Associated with CAD, HTN, HLD Continue metformin, Jardiance, Trulicity at current doses On ACE inhibitor and statin Up-to-date on vaccinations and screenings Recheck A1c

## 2020-01-22 NOTE — Assessment & Plan Note (Signed)
Continue statin Recheck FLP and CMP 

## 2020-01-23 LAB — COMPREHENSIVE METABOLIC PANEL
ALT: 23 IU/L (ref 0–44)
AST: 18 IU/L (ref 0–40)
Albumin/Globulin Ratio: 2.2 (ref 1.2–2.2)
Albumin: 4.9 g/dL — ABNORMAL HIGH (ref 3.8–4.8)
Alkaline Phosphatase: 42 IU/L (ref 39–117)
BUN/Creatinine Ratio: 13 (ref 10–24)
BUN: 15 mg/dL (ref 8–27)
Bilirubin Total: 0.5 mg/dL (ref 0.0–1.2)
CO2: 20 mmol/L (ref 20–29)
Calcium: 10 mg/dL (ref 8.6–10.2)
Chloride: 102 mmol/L (ref 96–106)
Creatinine, Ser: 1.13 mg/dL (ref 0.76–1.27)
GFR calc Af Amer: 76 mL/min/{1.73_m2} (ref >59)
GFR calc non Af Amer: 66 mL/min/{1.73_m2} (ref >59)
Globulin, Total: 2.2 g/dL (ref 1.5–4.5)
Glucose: 134 mg/dL — ABNORMAL HIGH (ref 65–99)
Potassium: 4.3 mmol/L (ref 3.5–5.2)
Sodium: 140 mmol/L (ref 134–144)
Total Protein: 7.1 g/dL (ref 6.0–8.5)

## 2020-01-23 LAB — LIPID PANEL
Chol/HDL Ratio: 4.8 ratio (ref 0.0–5.0)
Cholesterol, Total: 130 mg/dL (ref 100–199)
HDL: 27 mg/dL — ABNORMAL LOW (ref >39)
LDL Chol Calc (NIH): 79 mg/dL (ref 0–99)
Triglycerides: 132 mg/dL (ref 0–149)
VLDL Cholesterol Cal: 24 mg/dL (ref 5–40)

## 2020-01-23 LAB — HEMOGLOBIN A1C
Est. average glucose Bld gHb Est-mCnc: 177 mg/dL
Hgb A1c MFr Bld: 7.8 % — ABNORMAL HIGH (ref 4.8–5.6)

## 2020-01-26 ENCOUNTER — Other Ambulatory Visit: Payer: Self-pay | Admitting: *Deleted

## 2020-01-26 DIAGNOSIS — E1169 Type 2 diabetes mellitus with other specified complication: Secondary | ICD-10-CM

## 2020-01-26 MED ORDER — TRULICITY 3 MG/0.5ML ~~LOC~~ SOAJ: 3.0000 mg | SUBCUTANEOUS | 3 refills | Status: DC

## 2020-02-04 ENCOUNTER — Other Ambulatory Visit: Payer: Self-pay | Admitting: Family Medicine

## 2020-02-04 DIAGNOSIS — I10 Essential (primary) hypertension: Secondary | ICD-10-CM

## 2020-02-04 MED ORDER — AMLODIPINE BESYLATE 5 MG PO TABS: 5.0000 mg | ORAL_TABLET | Freq: Every day | ORAL | 1 refills | Status: DC

## 2020-02-04 NOTE — Telephone Encounter (Signed)
[facility] faxed refill request for the following medications:  amLODipine (NORVASC) 5 MG tablet  90 day supply  Last Rx: 11/06/2019 LOV: 01/22/2020 Please advise. Thanks TNP

## 2020-02-09 ENCOUNTER — Other Ambulatory Visit: Payer: Self-pay | Admitting: Family Medicine

## 2020-02-09 MED ORDER — EMPAGLIFLOZIN 25 MG PO TABS: 25.0000 mg | ORAL_TABLET | Freq: Every day | ORAL | 5 refills | Status: DC

## 2020-02-09 NOTE — Telephone Encounter (Signed)
[facility] faxed refill request for the following medications:  empagliflozin (JARDIANCE) 25 MG TABS tablet  Please advise.  Thanks, [employer]

## 2020-02-16 NOTE — Progress Notes (Signed)
See note from Terrill Mohr, medical student, from same day of service - cosigned by me

## 2020-02-17 ENCOUNTER — Other Ambulatory Visit: Payer: Self-pay

## 2020-02-17 ENCOUNTER — Encounter: Payer: Self-pay | Admitting: Family Medicine

## 2020-02-17 ENCOUNTER — Telehealth: Payer: Self-pay | Admitting: *Deleted

## 2020-02-17 ENCOUNTER — Ambulatory Visit (INDEPENDENT_AMBULATORY_CARE_PROVIDER_SITE_OTHER): Payer: PPO | Admitting: Family Medicine

## 2020-02-17 ENCOUNTER — Ambulatory Visit
Admission: RE | Admit: 2020-02-17 | Discharge: 2020-02-17 | Disposition: A | Discharge status: Home / Self Care | Payer: PPO | Source: Ambulatory Visit | Attending: Family Medicine | Admitting: Family Medicine

## 2020-02-17 VITALS — BP 136/80 | HR 72 | Temp 96.8°F | Resp 98 | Wt 205.0 lb

## 2020-02-17 DIAGNOSIS — R300 Dysuria: Secondary | ICD-10-CM

## 2020-02-17 DIAGNOSIS — R109 Unspecified abdominal pain: Secondary | ICD-10-CM | POA: Diagnosis not present

## 2020-02-17 LAB — POCT URINALYSIS DIPSTICK
Bilirubin, UA: NEGATIVE
Blood, UA: NEGATIVE
Glucose, UA: POSITIVE — AB
Ketones, UA: NEGATIVE
Leukocytes, UA: NEGATIVE
Nitrite, UA: NEGATIVE
Protein, UA: NEGATIVE
Spec Grav, UA: <=1.005 — AB (ref 1.010–1.025)
Urobilinogen, UA: 0.2 E.U./dL
pH, UA: 7.5 (ref 5.0–8.0)

## 2020-02-17 MED ORDER — SULFAMETHOXAZOLE-TRIMETHOPRIM 800-160 MG PO TABS
1.0000 | ORAL_TABLET | Freq: Two times a day (BID) | ORAL | 0 refills | Status: AC
Start: 2020-02-17 — End: 2020-02-22

## 2020-02-17 NOTE — Patient Instructions (Signed)

## 2020-02-17 NOTE — Progress Notes (Signed)
Established patient visit   Patient: [name]   DOB: 1950-10-24   69 y.o. Male  MRN: [id number] Visit Date: 02/17/2020  Today's healthcare provider: Lavon Paganini, MD   Chief Complaint  Patient presents with  . Dysuria  . Flank Pain   Subjective    HPI  Dysuria and Flank Pain Pt with new symptoms of dysuria that presented 2 weeks ago Pt has tried cranberry juice Pt states he gets a burning sensation when he urinates Pt states that he has flank pain bilaterally at baseline Pt has a history of kidney stones at 69yo Pt states symptoms today are very similar to past episodes Pt states that heat on the back helps the pain Pt states urinating makes the pain worse Pain radiates from flanks into groins bilaterally and into urethra Pt states that he drinks 3-4 bottles of water a day Denies changes in diet or taking new supplement   Positive for bladder pain, groin pain Negative for fever   ------------------------------------------------------------------------------------     Social History   Tobacco Use  . Smoking status: Current Every Day Smoker    Types: Cigars  . Smokeless tobacco: Never Used  . Tobacco comment: smokes cigars-about 10-15 cigars a week, quit cigarettes 20+ years ago  Substance Use Topics  . Alcohol use: No  . Drug use: No   Social History   Socioeconomic History  . Marital status: Divorced    Spouse name: Not on file  . Number of children: 2  . Years of education: Not on file  . Highest education level: 9th grade  Occupational History  . Occupation: retired  Tobacco Use  . Smoking status: Current Every Day Smoker    Types: Cigars  . Smokeless tobacco: Never Used  . Tobacco comment: smokes cigars-about 10-15 cigars a week, quit cigarettes 20+ years ago  Substance and Sexual Activity  . Alcohol use: No  . Drug use: No  . Sexual activity: Not on file  Other Topics Concern  . Not on file  Social History Narrative  . Not on  file   Social Determinants of Health   Financial Resource Strain: Low Risk   . Difficulty of Paying Living Expenses: Not hard at all  Food Insecurity: No Food Insecurity  . Worried About Charity fundraiser in the Last Year: Never true  . Ran Out of Food in the Last Year: Never true  Transportation Needs: No Transportation Needs  . Lack of Transportation (Medical): No  . Lack of Transportation (Non-Medical): No  Physical Activity: Insufficiently Active  . Days of Exercise per Week: 2 days  . Minutes of Exercise per Session: 30 min  Stress: No Stress Concern Present  . Feeling of Stress : Not at all  Social Connections: Somewhat Isolated  . Frequency of Communication with Friends and Family: More than three times a week  . Frequency of Social Gatherings with Friends and Family: More than three times a week  . Attends Religious Services: More than 4 times per year  . Active Member of Clubs or Organizations: No  . Attends Archivist Meetings: Never  . Marital Status: Divorced  Human resources officer Violence: Not At Risk  . Fear of Current or Ex-Partner: No  . Emotionally Abused: No  . Physically Abused: No  . Sexually Abused: No       Medications: Outpatient Medications Prior to Visit  Medication Sig  . amLODipine (NORVASC) 5 MG tablet Take 1 tablet (  5 mg total) by mouth daily.  Marland Kitchen aspirin 81 MG tablet Take 81 mg by mouth daily.   Marland Kitchen atorvastatin (LIPITOR) 80 MG tablet TAKE ONE (1) TABLET EACH DAY  . blood glucose meter kit and supplies KIT Dispense based on patient and insurance preference. Check sugars once daily  . Calcium Citrate-Vitamin D (CALCIUM + D PO) Take 1 tablet by mouth daily.  . clopidogrel (PLAVIX) 75 MG tablet Take 1 tablet (75 mg total) by mouth daily.  . Dulaglutide (TRULICITY) 3 NO/7.0JG SOPN Inject 3 mg into the skin once a week.  . empagliflozin (JARDIANCE) 25 MG TABS tablet Take 25 mg by mouth daily.  Marland Kitchen ezetimibe (ZETIA) 10 MG tablet Take 1 tablet (10  mg total) by mouth daily.  . fenofibrate (TRICOR) 145 MG tablet Take 1 tablet (145 mg total) by mouth daily.  Marland Kitchen glucose blood (ONETOUCH VERIO) test strip TEST BLOOD GLUCOSE EVERY DAY  . hydrochlorothiazide (HYDRODIURIL) 25 MG tablet Take 1 tablet (25 mg total) by mouth daily.  Marland Kitchen HYDROcodone-acetaminophen (NORCO) 5-325 MG tablet Take 1-2 tablets by mouth every 6 (six) hours as needed for moderate pain.  Marland Kitchen HYDROcodone-acetaminophen (NORCO) 5-325 MG tablet Take 1-2 tablets by mouth every 6 (six) hours as needed for moderate pain.  Marland Kitchen HYDROcodone-acetaminophen (NORCO/VICODIN) 5-325 MG tablet Take 1-2 tablets by mouth every 6 (six) hours as needed for moderate pain. Every 6 hours as needed for low back pain  . lisinopril (ZESTRIL) 20 MG tablet Take 1 tablet (20 mg total) by mouth daily.  . metFORMIN (GLUCOPHAGE) 1000 MG tablet TAKE ONE (1) TABLET BY MOUTH TWO (2) TIMES DAILY WITH FOOD  . metoprolol tartrate (LOPRESSOR) 50 MG tablet Take 1 tablet (50 mg total) by mouth 2 (two) times daily.  . pantoprazole (PROTONIX) 40 MG tablet TAKE ONE (1) TABLET BY MOUTH EVERY DAY AS NEEDED FOR HEARTBURN OR ACID REFLUX  . Potassium Gluconate 2.5 MEQ TABS Take by mouth daily.    No facility-administered medications prior to visit.    Review of Systems  Constitutional: Negative.   HENT: Negative.   Eyes: Negative.   Respiratory: Negative.   Cardiovascular: Negative.   Gastrointestinal: Negative.   Endocrine: Negative.   Genitourinary: Positive for dysuria, flank pain and penile pain. Negative for decreased urine volume, discharge, frequency, hematuria, penile swelling, scrotal swelling, testicular pain and urgency.  Skin: Negative.   Allergic/Immunologic: Negative.   Neurological: Negative.   Hematological: Negative.   Psychiatric/Behavioral: Negative.     Last CBC Lab Results  Component Value Date   WBC 8.4 02/26/2018   HGB 11.5 (L) 02/26/2018   HCT 33.7 (L) 02/26/2018   MCV 88.6 02/26/2018   MCH  30.3 02/26/2018   RDW 15.0 (H) 02/26/2018   PLT 206 28/36/6294   Last metabolic panel Lab Results  Component Value Date   GLUCOSE 134 (H) 01/22/2020   NA 140 01/22/2020   K 4.3 01/22/2020   CL 102 01/22/2020   CO2 20 01/22/2020   BUN 15 01/22/2020   CREATININE 1.13 01/22/2020   GFRNONAA 66 01/22/2020   GFRAA 76 01/22/2020   CALCIUM 10.0 01/22/2020   PROT 7.1 01/22/2020   ALBUMIN 4.9 (H) 01/22/2020   LABGLOB 2.2 01/22/2020   AGRATIO 2.2 01/22/2020   BILITOT 0.5 01/22/2020   ALKPHOS 42 01/22/2020   AST 18 01/22/2020   ALT 23 01/22/2020   ANIONGAP 4 (L) 02/26/2018   Last lipids Lab Results  Component Value Date   CHOL 130 01/22/2020  HDL 27 (L) 01/22/2020   LDLCALC 79 01/22/2020   TRIG 132 01/22/2020   CHOLHDL 4.8 01/22/2020       Objective    BP 136/80 (BP Location: Right Arm, Patient Position: Sitting, Cuff Size: Large)   Pulse 72   Temp (!) 96.8 F (36 C) (Temporal)   Resp (!) 98   Wt 205 lb (93 kg)   BMI 27.80 kg/m  BP Readings from Last 3 Encounters:  02/17/20 136/80  01/22/20 136/80  08/04/19 135/82   Wt Readings from Last 3 Encounters:  02/17/20 205 lb (93 kg)  01/22/20 205 lb (93 kg)  08/04/19 207 lb (93.9 kg)      Physical Exam Constitutional:      Appearance: Normal appearance.  HENT:     Head: Normocephalic and atraumatic.  Cardiovascular:     Rate and Rhythm: Normal rate and regular rhythm.     Pulses: Normal pulses.     Heart sounds: Normal heart sounds.  Pulmonary:     Effort: Pulmonary effort is normal.     Breath sounds: Normal breath sounds.  Abdominal:     General: Bowel sounds are normal. There is no distension.     Palpations: Abdomen is soft. There is no hepatomegaly, splenomegaly, mass or pulsatile mass.     Tenderness: There is no abdominal tenderness. There is right CVA tenderness and left CVA tenderness. There is no guarding or rebound. Negative signs include Murphy's sign and McBurney's sign.     Hernia: No hernia is  present.  Musculoskeletal:     Cervical back: Neck supple.     Right lower leg: No edema.     Left lower leg: No edema.  Skin:    General: Skin is warm and dry.     Capillary Refill: Capillary refill takes less than 2 seconds.  Neurological:     General: No focal deficit present.     Mental Status: He is alert and oriented to person, place, and time.  Psychiatric:        Mood and Affect: Mood normal.        Behavior: Behavior normal.      Results for orders placed or performed in visit on 02/17/20  POCT urinalysis dipstick  Result Value Ref Range   Color, UA     Clarity, UA     Glucose, UA Positive (A) Negative   Bilirubin, UA Negative    Ketones, UA Negative    Spec Grav, UA <=1.005 (A) 1.010 - 1.025   Blood, UA Negative    pH, UA 7.5 5.0 - 8.0   Protein, UA Negative Negative   Urobilinogen, UA 0.2 0.2 or 1.0 E.U./dL   Nitrite, UA Negative    Leukocytes, UA Negative Negative   Appearance     Odor      Assessment & Plan     Problem List Items Addressed This Visit      Other   Dysuria    Pt with new onset symptoms of dysuria that started 2 weeks ago Pt with a history of T2DM on empagliflozin Concerning for UTI given increased urinary glucose Urinalysis shows no signs of infection Pt with a history of kidney stones with similar symptom presentation Symptoms today most consistent with kidney stone  Plan: Will image with CT renal stone study Discussed with pt the importance of hydration to prevent stones Further workup pending the results of CT      Relevant Orders   POCT urinalysis dipstick (  Completed)   CT RENAL STONE STUDY (Completed)   Acute flank pain - Primary    Pt with new onset symptoms of flank pain that started 2 weeks ago Pt with a history of T2DM on empagliflozin Concerning for UTI given increased urinary glucose Urinalysis shows no signs of infection Pt with a history of kidney stones with similar symptom presentation Symptoms today most  consistent with kidney stone  Plan: Will image with CT renal stone study Discussed with pt the importance of hydration to prevent stones Further workup pending the results of CT      Relevant Orders   CT RENAL STONE STUDY (Completed)       Return if symptoms worsen or fail to improve.      Chipper Herb, Medical Student [facility] [phone] (phone) [phone] (fax)  [place]

## 2020-02-17 NOTE — Assessment & Plan Note (Addendum)
Pt with new onset symptoms of flank pain that started 2 weeks ago Pt with a history of T2DM on empagliflozin Concerning for UTI given increased urinary glucose Urinalysis shows no signs of infection Pt with a history of kidney stones with similar symptom presentation Symptoms today most consistent with kidney stone  Plan: Will image with CT renal stone study Discussed with pt the importance of hydration to prevent stones Further workup pending the results of CT

## 2020-02-17 NOTE — Telephone Encounter (Signed)
Imaging is calling with results- patient was instructed to wait for report to office before leaving- call transferred to office for patient instuctions

## 2020-02-17 NOTE — Assessment & Plan Note (Signed)
Pt with new onset symptoms of dysuria that started 2 weeks ago Pt with a history of T2DM on empagliflozin Concerning for UTI given increased urinary glucose Urinalysis shows no signs of infection Pt with a history of kidney stones with similar symptom presentation Symptoms today most consistent with kidney stone  Plan: Will image with CT renal stone study Discussed with pt the importance of hydration to prevent stones Further workup pending the results of CT

## 2020-02-19 DIAGNOSIS — Z955 Presence of coronary angioplasty implant and graft: Secondary | ICD-10-CM | POA: Diagnosis not present

## 2020-02-19 DIAGNOSIS — I10 Essential (primary) hypertension: Secondary | ICD-10-CM | POA: Diagnosis not present

## 2020-02-19 DIAGNOSIS — E785 Hyperlipidemia, unspecified: Secondary | ICD-10-CM | POA: Diagnosis not present

## 2020-02-19 DIAGNOSIS — I2510 Atherosclerotic heart disease of native coronary artery without angina pectoris: Secondary | ICD-10-CM | POA: Diagnosis not present

## 2020-02-22 LAB — CULTURE, URINE COMPREHENSIVE

## 2020-02-23 ENCOUNTER — Telehealth: Payer: Self-pay

## 2020-02-23 NOTE — Telephone Encounter (Signed)
-----   Message from Erasmo Downer, MD sent at 02/23/2020  8:40 AM EDT ----- Urine culture confirms UTI that is sensitive to abx prescribed.  Hope he is doing better.

## 2020-02-23 NOTE — Telephone Encounter (Signed)
Attempted to reach pt; left VM to CB. 

## 2020-02-23 NOTE — Telephone Encounter (Signed)
LMTCB, PEC may give result.  

## 2020-02-24 NOTE — Telephone Encounter (Signed)
Pt given information per Dr Beryle Flock; he verbalized understanding.

## 2020-02-24 NOTE — Telephone Encounter (Signed)
Attempted to contact pt; left message on voicemail of cell phone.

## 2020-03-24 ENCOUNTER — Other Ambulatory Visit: Payer: Self-pay | Admitting: Family Medicine

## 2020-03-24 MED ORDER — HYDROCODONE-ACETAMINOPHEN 5-325 MG PO TABS: 1.0000 | ORAL_TABLET | Freq: Four times a day (QID) | ORAL | 0 refills | Status: DC | PRN

## 2020-03-24 NOTE — Telephone Encounter (Signed)
Requested medication (s) are due for refill today: yes  Requested medication (s) are on the active medication list: yes  Last refill: 01/22/20  Future visit scheduled: yes  Notes to clinic: not delegated   Requested Prescriptions  Pending Prescriptions Disp Refills   HYDROcodone-acetaminophen (NORCO) 5-325 MG tablet 60 tablet 0    Sig: Take 1-2 tablets by mouth every 6 (six) hours as needed for moderate pain.      Not Delegated - Analgesics:  Opioid Agonist Combinations Failed - 03/24/2020  2:17 PM      Failed - This refill cannot be delegated      Failed - Urine Drug Screen completed in last 360 days.      Passed - Valid encounter within last 6 months    Recent Outpatient Visits           1 month ago Acute flank pain   [facility] [place], Marzella Schlein, MD   2 months ago Encounter for annual physical exam   [facility] [place], Marzella Schlein, MD   7 months ago Type 2 diabetes mellitus with other specified complication, without long-term current use of insulin [facility])   [facility], Marzella Schlein, MD   10 months ago Type 2 diabetes mellitus with other specified complication, without long-term current use of insulin [facility])   [facility], Marzella Schlein, MD   1 year ago Type 2 diabetes mellitus with other specified complication, without long-term current use of insulin [facility]   [facility] [place], Marzella Schlein, MD       Future Appointments             In 4 weeks Bacigalupo, Marzella Schlein, MD [facility], [facility]   In 3 months Bacigalupo, Marzella Schlein, MD [facility], [facility]

## 2020-03-24 NOTE — Telephone Encounter (Signed)
PT needs a refill  HYDROcodone-acetaminophen (NORCO) 5-325 MG tablet [[id number]] [id number] - [place], [place] - [address] AT [facility] [address] [place]  [address] [place] [address]  Phone: [phone] Fax: [phone]

## 2020-04-09 ENCOUNTER — Other Ambulatory Visit: Payer: Self-pay | Admitting: Family Medicine

## 2020-04-09 DIAGNOSIS — E1169 Type 2 diabetes mellitus with other specified complication: Secondary | ICD-10-CM

## 2020-04-09 MED ORDER — TRULICITY 3 MG/0.5ML ~~LOC~~ SOAJ: 3.0000 mg | SUBCUTANEOUS | 3 refills | Status: DC

## 2020-04-09 NOTE — Telephone Encounter (Signed)
[facility] faxed refill request for the following medications:  Dulaglutide (TRULICITY) 3 MG/0.5ML SOPN   Please advise.

## 2020-04-20 ENCOUNTER — Other Ambulatory Visit: Payer: Self-pay | Admitting: Family Medicine

## 2020-04-20 DIAGNOSIS — E1169 Type 2 diabetes mellitus with other specified complication: Secondary | ICD-10-CM

## 2020-04-20 NOTE — Telephone Encounter (Signed)
Requested medication (s) are due for refill today: yes  Requested medication (s) are on the active medication list: yes  Last refill:  03/24/20  Future visit scheduled: yes  Notes to clinic:  med not delegated to NT to RF   Requested Prescriptions  Pending Prescriptions Disp Refills   HYDROcodone-acetaminophen (NORCO) 5-325 MG tablet 60 tablet     Sig: Take 1-2 tablets by mouth every 6 (six) hours as needed for moderate pain.      Not Delegated - Analgesics:  Opioid Agonist Combinations Failed - 04/20/2020 12:58 PM      Failed - This refill cannot be delegated      Failed - Urine Drug Screen completed in last 360 days.      Passed - Valid encounter within last 6 months    Recent Outpatient Visits           2 months ago Acute flank pain   [facility] [place], Marzella Schlein, MD   2 months ago Encounter for annual physical exam   [facility] [place], Marzella Schlein, MD   8 months ago Type 2 diabetes mellitus with other specified complication, without long-term current use of insulin [facility])   [facility], Marzella Schlein, MD   11 months ago Type 2 diabetes mellitus with other specified complication, without long-term current use of insulin [facility])   [facility], Marzella Schlein, MD   1 year ago Type 2 diabetes mellitus with other specified complication, without long-term current use of insulin [facility])   [facility], Marzella Schlein, MD       Future Appointments             In 1 week [place], Alessandra Bevels, PA-C [facility], [facility]   In 2 months Bacigalupo, Marzella Schlein, MD [facility], [facility]             Refused Prescriptions Disp Refills   Dulaglutide (TRULICITY) 3 MG/0.5ML SOPN 0.5 mL     Sig: Inject 0.5 mLs (3 mg total) into the skin once a week.      Endocrinology:  Diabetes - GLP-1 Receptor Agonists Passed - 04/20/2020 12:58 PM      Passed - HBA1C is between 0 and 7.9  and within 180 days    Hgb A1c MFr Bld  Date Value Ref Range Status  01/22/2020 7.8 (H) 4.8 - 5.6 % Final    Comment:             Prediabetes: 5.7 - 6.4          Diabetes: >6.4          Glycemic control for adults with diabetes: <7.0           Passed - Valid encounter within last 6 months    Recent Outpatient Visits           2 months ago Acute flank pain   [facility] [place], Marzella Schlein, MD   2 months ago Encounter for annual physical exam   [facility] [place], Marzella Schlein, MD   8 months ago Type 2 diabetes mellitus with other specified complication, without long-term current use of insulin [facility])   [facility], Marzella Schlein, MD   11 months ago Type 2 diabetes mellitus with other specified complication, without long-term current use of insulin [facility])   [facility] [place], Marzella Schlein, MD   1 year ago Type 2 diabetes mellitus with  other specified complication, without long-term current use of insulin [facility])   [facility] [place], Marzella Schlein, MD       Future Appointments             In 1 week [place], Alessandra Bevels, PA-C [facility], [facility]   In 2 months Bacigalupo, Marzella Schlein, MD [facility], [facility]

## 2020-04-20 NOTE — Telephone Encounter (Signed)
Requested Prescriptions  Pending Prescriptions Disp Refills   HYDROcodone-acetaminophen (NORCO) 5-325 MG tablet 60 tablet     Sig: Take 1-2 tablets by mouth every 6 (six) hours as needed for moderate pain.     Not Delegated - Analgesics:  Opioid Agonist Combinations Failed - 04/20/2020 12:58 PM      Failed - This refill cannot be delegated      Failed - Urine Drug Screen completed in last 360 days.      Passed - Valid encounter within last 6 months    Recent Outpatient Visits          2 months ago Acute flank pain   [facility] [place], Marzella Schlein, MD   2 months ago Encounter for annual physical exam   [facility] [place], Marzella Schlein, MD   8 months ago Type 2 diabetes mellitus with other specified complication, without long-term current use of insulin [facility])   [facility], Marzella Schlein, MD   11 months ago Type 2 diabetes mellitus with other specified complication, without long-term current use of insulin [facility])   [facility], Marzella Schlein, MD   1 year ago Type 2 diabetes mellitus with other specified complication, without long-term current use of insulin [facility])   [facility], Marzella Schlein, MD      Future Appointments            In 1 week [place], Alessandra Bevels, PA-C [facility], [facility]   In 2 months Bacigalupo, Marzella Schlein, MD [facility], [facility]            Dulaglutide (TRULICITY) 3 MG/0.5ML SOPN 0.5 mL     Sig: Inject 0.5 mLs (3 mg total) into the skin once a week.     Endocrinology:  Diabetes - GLP-1 Receptor Agonists Passed - 04/20/2020 12:58 PM      Passed - HBA1C is between 0 and 7.9 and within 180 days    Hgb A1c MFr Bld  Date Value Ref Range Status  01/22/2020 7.8 (H) 4.8 - 5.6 % Final    Comment:             Prediabetes: 5.7 - 6.4          Diabetes: >6.4          Glycemic control for adults with diabetes: <7.0          Passed - Valid encounter  within last 6 months    Recent Outpatient Visits          2 months ago Acute flank pain   [facility] [place], Marzella Schlein, MD   2 months ago Encounter for annual physical exam   [facility] [place], Marzella Schlein, MD   8 months ago Type 2 diabetes mellitus with other specified complication, without long-term current use of insulin [facility])   [facility], Marzella Schlein, MD   11 months ago Type 2 diabetes mellitus with other specified complication, without long-term current use of insulin [facility])   [facility] [place], Marzella Schlein, MD   1 year ago Type 2 diabetes mellitus with other specified complication, without long-term current use of insulin [facility])   [facility] [place], Marzella Schlein, MD      Future Appointments            In 1 week [place], Alessandra Bevels, PA-C [facility], [facility]   In 2 months Bacigalupo, Marzella Schlein, MD [facility], [facility]

## 2020-04-20 NOTE — Telephone Encounter (Signed)
Medication Refill - Medication: Hydrocodone, Trulicity  Has the patient contacted their pharmacy? No. Patient states pharmacy advised patient to have PCP send in for pain medication. (Agent: If no, request that the patient contact the pharmacy for the refill.) (Agent: If yes, when and what did the pharmacy advise?)  Preferred Pharmacy (with phone number or street name): [facility] [address]  Agent: Please be advised that RX refills may take up to 3 business days. We ask that you follow-up with your pharmacy.

## 2020-04-21 MED ORDER — HYDROCODONE-ACETAMINOPHEN 5-325 MG PO TABS: 1.0000 | ORAL_TABLET | Freq: Four times a day (QID) | ORAL | 0 refills | Status: DC | PRN

## 2020-04-22 ENCOUNTER — Ambulatory Visit: Payer: Self-pay | Admitting: Family Medicine

## 2020-04-29 ENCOUNTER — Ambulatory Visit: Payer: Self-pay | Admitting: Physician Assistant

## 2020-05-07 ENCOUNTER — Other Ambulatory Visit: Payer: Self-pay

## 2020-05-07 ENCOUNTER — Ambulatory Visit (INDEPENDENT_AMBULATORY_CARE_PROVIDER_SITE_OTHER): Payer: PPO | Admitting: Physician Assistant

## 2020-05-07 ENCOUNTER — Encounter: Payer: Self-pay | Admitting: Physician Assistant

## 2020-05-07 VITALS — BP 129/84 | HR 67 | Temp 97.2°F | Resp 16 | Wt 203.2 lb

## 2020-05-07 DIAGNOSIS — E1169 Type 2 diabetes mellitus with other specified complication: Secondary | ICD-10-CM

## 2020-05-07 LAB — POCT GLYCOSYLATED HEMOGLOBIN (HGB A1C)
Est. average glucose Bld gHb Est-mCnc: 177
Hemoglobin A1C: 7.8 % — AB (ref 4.0–5.6)

## 2020-05-07 NOTE — Progress Notes (Signed)
I,Joseline E Rosas,acting as a scribe for [employer], PA-C.,have documented all relevant documentation on the behalf of Mar Daring, PA-C,as directed by  Mar Daring, PA-C while in the presence of Mar Daring, [place].  Established patient visit   Patient: [name]   DOB: 05-31-1951   69 y.o. Male  MRN: [id number] Visit Date: 05/07/2020   Today's healthcare provider: Mar Daring, PA-C   Chief Complaint  Patient presents with  . Diabetes   Subjective    HPI  Diabetes Mellitus Type II, Follow-up  Lab Results  Component Value Date   HGBA1C 7.8 (A) 05/07/2020   HGBA1C 7.8 (H) 01/22/2020   HGBA1C 7.4 (A) 08/04/2019   Wt Readings from Last 3 Encounters:  05/07/20 203 lb 3.2 oz (92.2 kg)  02/17/20 205 lb (93 kg)  01/22/20 205 lb (93 kg)   Last seen for diabetes 3 months ago.  Management since then includes continue current medication plan. He reports excellent compliance with treatment. He is not having side effects.  Symptoms: No fatigue No foot ulcerations  No appetite changes No nausea  No paresthesia of the feet  No polydipsia  No polyuria No visual disturbances   No vomiting     Home blood sugar records: fasting range: 120's  Episodes of hypoglycemia? No    Current insulin regiment: Trulicity   Pertinent Labs: Lab Results  Component Value Date   CHOL 130 01/22/2020   HDL 27 (L) 01/22/2020   LDLCALC 79 01/22/2020   TRIG 132 01/22/2020   CHOLHDL 4.8 01/22/2020   Lab Results  Component Value Date   NA 140 01/22/2020   K 4.3 01/22/2020   CREATININE 1.13 01/22/2020   GFRNONAA 66 01/22/2020   GFRAA 76 01/22/2020   GLUCOSE 134 (H) 01/22/2020     ---------------------------------------------------------------------------------------------------   Patient Active Problem List   Diagnosis Date Noted  . Dysuria 02/17/2020  . Acute flank pain 02/17/2020  . Overweight 01/22/2020  . Spondylosis of lumbar region  without myelopathy or radiculopathy 03/08/2016  . Arteriosclerosis of coronary artery 06/04/2015  . S/P coronary artery stent placement 06/04/2015  . CAD in native artery 06/04/2015  . Essential (primary) hypertension 05/28/2015  . Hyperlipidemia associated with type 2 diabetes mellitus ([place]) 05/28/2015  . Insomnia 05/28/2015  . 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 Gastro-esophageal reflux disease without esophagitis 05/28/2015  . T2DM (type 2 diabetes mellitus) ([place]) 05/28/2015   Past Medical History:  Diagnosis Date  . Arthritis    osteoarthriis of back  . Coronary artery disease   . Depression   . Diabetes mellitus without complication ([place])   . GERD (gastroesophageal reflux disease)   . Hyperlipidemia   . Hypertension        Medications: Outpatient Medications Prior to Visit  Medication Sig  . amLODipine (NORVASC) 5 MG tablet Take 1 tablet (5 mg total) by mouth daily.  Marland Kitchen aspirin 81 MG tablet Take 81 mg by mouth daily.   Marland Kitchen atorvastatin (LIPITOR) 80 MG tablet TAKE ONE (1) TABLET EACH DAY  . blood glucose meter kit and supplies KIT Dispense based on patient and insurance preference. Check sugars once daily  . Calcium Citrate-Vitamin D (CALCIUM + D PO) Take 1 tablet by mouth daily.  . clopidogrel (PLAVIX) 75 MG tablet Take 1 tablet (75 mg total) by mouth daily.  . Dulaglutide (TRULICITY) 3 NW/2.9FA SOPN Inject 0.5 mLs (3 mg total) into the skin once a week.  Marland Kitchen  empagliflozin (JARDIANCE) 25 MG TABS tablet Take 25 mg by mouth daily.  Marland Kitchen ezetimibe (ZETIA) 10 MG tablet Take 1 tablet (10 mg total) by mouth daily.  . fenofibrate (TRICOR) 145 MG tablet Take 1 tablet (145 mg total) by mouth daily.  Marland Kitchen glucose blood (ONETOUCH VERIO) test strip TEST BLOOD GLUCOSE EVERY DAY  . hydrochlorothiazide (HYDRODIURIL) 25 MG tablet Take 1 tablet (25 mg total) by mouth daily.  Marland Kitchen HYDROcodone-acetaminophen (NORCO) 5-325 MG tablet Take 1-2 tablets by mouth every 6 (six) hours as needed for moderate  pain.  Marland Kitchen lisinopril (ZESTRIL) 20 MG tablet Take 1 tablet (20 mg total) by mouth daily.  . metFORMIN (GLUCOPHAGE) 1000 MG tablet TAKE ONE (1) TABLET BY MOUTH TWO (2) TIMES DAILY WITH FOOD  . metoprolol tartrate (LOPRESSOR) 50 MG tablet Take 1 tablet (50 mg total) by mouth 2 (two) times daily.  . pantoprazole (PROTONIX) 40 MG tablet TAKE ONE (1) TABLET BY MOUTH EVERY DAY AS NEEDED FOR HEARTBURN OR ACID REFLUX  . Potassium Gluconate 2.5 MEQ TABS Take by mouth daily.    No facility-administered medications prior to visit.    Review of Systems  Constitutional: Negative for chills and fatigue.  Respiratory: Negative for chest tightness, shortness of breath and wheezing.   Cardiovascular: Negative for chest pain, palpitations and leg swelling.  Neurological: Negative for dizziness, weakness and headaches.    Last CBC Lab Results  Component Value Date   WBC 8.4 02/26/2018   HGB 11.5 (L) 02/26/2018   HCT 33.7 (L) 02/26/2018   MCV 88.6 02/26/2018   MCH 30.3 02/26/2018   RDW 15.0 (H) 02/26/2018   PLT 206 18/33/5825   Last metabolic panel Lab Results  Component Value Date   GLUCOSE 134 (H) 01/22/2020   NA 140 01/22/2020   K 4.3 01/22/2020   CL 102 01/22/2020   CO2 20 01/22/2020   BUN 15 01/22/2020   CREATININE 1.13 01/22/2020   GFRNONAA 66 01/22/2020   GFRAA 76 01/22/2020   CALCIUM 10.0 01/22/2020   PROT 7.1 01/22/2020   ALBUMIN 4.9 (H) 01/22/2020   LABGLOB 2.2 01/22/2020   AGRATIO 2.2 01/22/2020   BILITOT 0.5 01/22/2020   ALKPHOS 42 01/22/2020   AST 18 01/22/2020   ALT 23 01/22/2020   ANIONGAP 4 (L) 02/26/2018      Objective    BP 129/84 (BP Location: Left Arm, Patient Position: Sitting, Cuff Size: Normal)   Pulse 67   Temp (!) 97.2 F (36.2 C) (Temporal)   Resp 16   Wt 203 lb 3.2 oz (92.2 kg)   BMI 27.56 kg/m  BP Readings from Last 3 Encounters:  05/07/20 129/84  02/17/20 136/80  01/22/20 136/80   Wt Readings from Last 3 Encounters:  05/07/20 203 lb 3.2 oz  (92.2 kg)  02/17/20 205 lb (93 kg)  01/22/20 205 lb (93 kg)      Physical Exam Vitals reviewed.  Constitutional:      General: He is not in acute distress.    Appearance: Normal appearance. He is well-developed, well-groomed and overweight. He is not diaphoretic.  HENT:     Head: Normocephalic and atraumatic.  Neck:     Thyroid: No thyromegaly.     Vascular: No JVD.     Trachea: No tracheal deviation.  Cardiovascular:     Rate and Rhythm: Normal rate and regular rhythm.     Heart sounds: Normal heart sounds. No murmur heard.  No friction rub. No gallop.   Pulmonary:  Effort: Pulmonary effort is normal. No respiratory distress.     Breath sounds: Normal breath sounds. No wheezing or rales.  Musculoskeletal:     Cervical back: Normal range of motion and neck supple.  Lymphadenopathy:     Cervical: No cervical adenopathy.  Neurological:     Mental Status: He is alert.  Psychiatric:        Behavior: Behavior is cooperative.       Results for orders placed or performed in visit on 05/07/20  POCT glycosylated hemoglobin (Hb A1C)  Result Value Ref Range   Hemoglobin A1C 7.8 (A) 4.0 - 5.6 %   Est. average glucose Bld gHb Est-mCnc 177     Assessment & Plan     Problem List Items Addressed This Visit      Endocrine   T2DM (type 2 diabetes mellitus) (HCC) - Primary    Stable A1c at 7.8 DM associated with HTN, HLD, CAD On lisinopril and atorvastatin, setia and fenofibrate Continue Trulicity 69m SQ weekly, Jardiance 236mdaily, metformin 100044mID. F/U in 3 months      Relevant Orders   POCT glycosylated hemoglobin (Hb A1C) (Completed)      Return in about 3 months (around 08/07/2020) for T2DM, HTN.      I, JenMar DaringA-C, have reviewed all documentation for this visit. The documentation on 05/11/20 for the exam, diagnosis, procedures, and orders are all accurate and complete.   JenRubye BeachurHendricks Regional [phone]) [phone])  ConOfferman

## 2020-05-11 NOTE — Assessment & Plan Note (Signed)
Stable A1c at 7.8 DM associated with HTN, HLD, CAD On lisinopril and atorvastatin, setia and fenofibrate Continue Trulicity 3mg  SQ weekly, Jardiance 25mg  daily, metformin 1000mg  BID. F/U in 3 months

## 2020-05-24 ENCOUNTER — Other Ambulatory Visit: Payer: Self-pay | Admitting: Family Medicine

## 2020-05-24 MED ORDER — HYDROCODONE-ACETAMINOPHEN 5-325 MG PO TABS: 1.0000 | ORAL_TABLET | Freq: Four times a day (QID) | ORAL | 0 refills | Status: DC | PRN

## 2020-05-24 NOTE — Telephone Encounter (Signed)
RX REFILL HYDROcodone-acetaminophen (NORCO) 5-325 MG tablet [[id number]] PHARMACY [id number] - [place], [place] - [address] AT [facility] OF [address] [facility] Phone:  [phone]  Fax:  [phone]

## 2020-06-14 ENCOUNTER — Other Ambulatory Visit: Payer: Self-pay | Admitting: Family Medicine

## 2020-06-14 DIAGNOSIS — E1169 Type 2 diabetes mellitus with other specified complication: Secondary | ICD-10-CM

## 2020-06-14 DIAGNOSIS — I2510 Atherosclerotic heart disease of native coronary artery without angina pectoris: Secondary | ICD-10-CM

## 2020-06-14 DIAGNOSIS — E785 Hyperlipidemia, unspecified: Secondary | ICD-10-CM

## 2020-06-14 DIAGNOSIS — I10 Essential (primary) hypertension: Secondary | ICD-10-CM

## 2020-06-14 DIAGNOSIS — E119 Type 2 diabetes mellitus without complications: Secondary | ICD-10-CM

## 2020-06-14 MED ORDER — HYDROCHLOROTHIAZIDE 25 MG PO TABS: 25.0000 mg | ORAL_TABLET | Freq: Every day | ORAL | 1 refills | Status: DC

## 2020-06-14 MED ORDER — METFORMIN HCL 1000 MG PO TABS: ORAL_TABLET | ORAL | 1 refills | Status: DC

## 2020-06-14 MED ORDER — METOPROLOL TARTRATE 50 MG PO TABS: 50.0000 mg | ORAL_TABLET | Freq: Two times a day (BID) | ORAL | 1 refills | Status: DC

## 2020-06-14 MED ORDER — CLOPIDOGREL BISULFATE 75 MG PO TABS: 75.0000 mg | ORAL_TABLET | Freq: Every day | ORAL | 1 refills | Status: DC

## 2020-06-14 MED ORDER — FENOFIBRATE 145 MG PO TABS: 145.0000 mg | ORAL_TABLET | Freq: Every day | ORAL | 1 refills | Status: DC

## 2020-06-14 MED ORDER — LISINOPRIL 20 MG PO TABS: 20.0000 mg | ORAL_TABLET | Freq: Every day | ORAL | 3 refills | Status: DC

## 2020-06-14 NOTE — Telephone Encounter (Signed)
[facility] faxed refill request for the following medications:  metFORMIN (GLUCOPHAGE) 1000 MG tablet clopidogrel (PLAVIX) 75 MG tablet lisinopril (ZESTRIL) 20 MG tablet metoprolol tartrate (LOPRESSOR) 50 MG tablet hydrochlorothiazide (HYDRODIURIL) 25 MG tablet fenofibrate (TRICOR) 145 MG tablet    Please advise.  Thanks, [employer]

## 2020-06-25 ENCOUNTER — Other Ambulatory Visit: Payer: Self-pay | Admitting: Family Medicine

## 2020-06-25 MED ORDER — HYDROCODONE-ACETAMINOPHEN 5-325 MG PO TABS: 1.0000 | ORAL_TABLET | Freq: Four times a day (QID) | ORAL | 0 refills | Status: DC | PRN

## 2020-06-25 NOTE — Telephone Encounter (Signed)
Please advise for Dr. Leonard Schwartz?

## 2020-06-25 NOTE — Telephone Encounter (Signed)
Requested medication (s) are due for refill today: yes  Requested medication (s) are on the active medication list: yes  Last refill:  05/24/2020  Future visit scheduled:yes  Notes to clinic:  this refill cannot be delegated    Requested Prescriptions  Pending Prescriptions Disp Refills   HYDROcodone-acetaminophen (NORCO) 5-325 MG tablet 60 tablet 0    Sig: Take 1-2 tablets by mouth every 6 (six) hours as needed for moderate pain.      Not Delegated - Analgesics:  Opioid Agonist Combinations Failed - 06/25/2020  3:12 PM      Failed - This refill cannot be delegated      Failed - Urine Drug Screen completed in last 360 days.      Passed - Valid encounter within last 6 months    Recent Outpatient Visits           1 month ago Type 2 diabetes mellitus with other specified complication, without long-term current use of insulin [facility])   [facility] [place], [place], [place]   4 months ago Acute flank pain   [facility] [place], Marzella Schlein, [place]   5 months ago Encounter for annual physical exam   [facility], Marzella Schlein, [place]   10 months ago Type 2 diabetes mellitus with other specified complication, without long-term current use of insulin [facility])   [facility], Marzella Schlein, MD   1 year ago Type 2 diabetes mellitus with other specified complication, without long-term current use of insulin [facility])   [facility] [place], Marzella Schlein, MD       Future Appointments             In 1 month [place], Marzella Schlein, MD [facility]

## 2020-06-25 NOTE — Telephone Encounter (Signed)
Copied from CRM [id number]. Topic: Quick Communication - Rx Refill/Question >> Jun 25, 2020  2:49 PM Mcneil, Ja-Kwan wrote: Medication: HYDROcodone-acetaminophen (NORCO) 5-325 MG tablet  Has the patient contacted their pharmacy? no  Preferred Pharmacy (with phone number or street name): [facility][id number] [place], [place] - [address] AT [facility] OF [address] [facility] Phone: [phone] Fax: [phone]  Agent: Please be advised that RX refills may take up to 3 business days. We ask that you follow-up with your pharmacy.

## 2020-07-01 ENCOUNTER — Ambulatory Visit: Payer: PPO | Admitting: Family Medicine

## 2020-07-23 ENCOUNTER — Other Ambulatory Visit: Payer: Self-pay | Admitting: Family Medicine

## 2020-07-23 NOTE — Telephone Encounter (Signed)
Medication Refill - Medication: Hydrocodone  Has the patient contacted their pharmacy? No. (Agent: If no, request that the patient contact the pharmacy for the refill.) (Agent: If yes, when and what did the pharmacy advise?)  Preferred Pharmacy (with phone number or street name)[id number] - [place], [place] - [address] AT [facility] OF [address]  Agent: Please be advised that RX refills may take up to 3 business days. We ask that you follow-up with your pharmacy.  

## 2020-07-23 NOTE — Telephone Encounter (Signed)
Requested  medications are  due for refill today yes  Requested medications are on the active medication list yes  Last refill 9/1  Last visit 05/07/20  Future visit scheduled 08/09/20  Notes to clinic Not Delegated

## 2020-07-26 MED ORDER — HYDROCODONE-ACETAMINOPHEN 5-325 MG PO TABS: 1.0000 | ORAL_TABLET | Freq: Four times a day (QID) | ORAL | 0 refills | Status: DC | PRN

## 2020-07-26 NOTE — Telephone Encounter (Signed)
Please review for Dr. B ° ° °Thanks,  ° °-Mavrik Bynum  °

## 2020-08-09 ENCOUNTER — Other Ambulatory Visit: Payer: Self-pay

## 2020-08-09 ENCOUNTER — Ambulatory Visit: Payer: Self-pay | Admitting: Physician Assistant

## 2020-08-09 ENCOUNTER — Encounter: Payer: Self-pay | Admitting: Family Medicine

## 2020-08-09 ENCOUNTER — Ambulatory Visit (INDEPENDENT_AMBULATORY_CARE_PROVIDER_SITE_OTHER): Payer: PPO | Admitting: Family Medicine

## 2020-08-09 VITALS — BP 129/79 | HR 72 | Temp 97.9°F | Resp 16 | Wt 208.2 lb

## 2020-08-09 DIAGNOSIS — E785 Hyperlipidemia, unspecified: Secondary | ICD-10-CM

## 2020-08-09 DIAGNOSIS — Z79899 Other long term (current) drug therapy: Secondary | ICD-10-CM

## 2020-08-09 DIAGNOSIS — E1169 Type 2 diabetes mellitus with other specified complication: Secondary | ICD-10-CM

## 2020-08-09 DIAGNOSIS — I10 Essential (primary) hypertension: Secondary | ICD-10-CM | POA: Diagnosis not present

## 2020-08-09 DIAGNOSIS — I2510 Atherosclerotic heart disease of native coronary artery without angina pectoris: Secondary | ICD-10-CM

## 2020-08-09 DIAGNOSIS — E663 Overweight: Secondary | ICD-10-CM

## 2020-08-09 DIAGNOSIS — Z23 Encounter for immunization: Secondary | ICD-10-CM | POA: Diagnosis not present

## 2020-08-09 LAB — POCT GLYCOSYLATED HEMOGLOBIN (HGB A1C): Hemoglobin A1C: 7.4 % — AB (ref 4.0–5.6)

## 2020-08-09 MED ORDER — TRULICITY 4.5 MG/0.5ML ~~LOC~~ SOAJ: 4.5000 mg | SUBCUTANEOUS | 5 refills | Status: DC

## 2020-08-09 NOTE — Addendum Note (Signed)
Addended by: Fonda Kinder on: 08/09/2020 02:28 PM   Modules accepted: Orders

## 2020-08-09 NOTE — Progress Notes (Signed)
Established patient visit   Patient: [name]   DOB: 07-Jul-1951   69 y.o. Male  MRN: [id number] Visit Date: 08/09/2020  Today's healthcare provider: Lavon Paganini, MD   Chief Complaint  Patient presents with  . Diabetes  . Hyperlipidemia  . Hypertension   Subjective    HPI  Diabetes Mellitus Type II, follow-up  Lab Results  Component Value Date   HGBA1C 7.4 (A) 08/09/2020   HGBA1C 7.8 (A) 05/07/2020   HGBA1C 7.8 (H) 01/22/2020   Last seen for diabetes 3 months ago.  Management since then includes continuing the same treatment. He reports excellent compliance with treatment. He is not having side effects.   Home blood sugar records: fasting range: 118-157  Episodes of hypoglycemia? No    Current insulin regiment: trulicity inject 0.8QP once a week Most Recent Eye Exam: 07/16/2020  --------------------------------------------------------------------------------------------------- Hypertension, follow-up  BP Readings from Last 3 Encounters:  08/09/20 129/79  05/07/20 129/84  02/17/20 136/80   Wt Readings from Last 3 Encounters:  08/09/20 208 lb 3.2 oz (94.4 kg)  05/07/20 203 lb 3.2 oz (92.2 kg)  02/17/20 205 lb (93 kg)     He was last seen for hypertension 3 months ago.  BP at that visit was 129/84 . Management since that visit includes none. He reports excellent compliance with treatment. He is not having side effects.  He is not exercising. He is adherent to low salt diet.   Outside blood pressures are not being checked.  He does not smoke.  Use of agents associated with hypertension: NSAIDS.   --------------------------------------------------------------------------------------------------- Lipid/Cholesterol, follow-up  Last Lipid Panel: Lab Results  Component Value Date   CHOL 130 01/22/2020   LDLCALC 79 01/22/2020   HDL 27 (L) 01/22/2020   TRIG 132 01/22/2020    He was last seen for this 3 months ago.  Management since that  visit includes none.  He reports excellent compliance with treatment. He is not having side effects.   Symptoms: No appetite changes No foot ulcerations  No chest pain No chest pressure/discomfort  No dyspnea No orthopnea  No fatigue No lower extremity edema  No palpitations No paroxysmal nocturnal dyspnea  No nausea No numbness or tingling of extremity  No polydipsia No polyuria  No speech difficulty No syncope   He is following a Regular diet. Current exercise: none  Last metabolic panel Lab Results  Component Value Date   GLUCOSE 134 (H) 01/22/2020   NA 140 01/22/2020   K 4.3 01/22/2020   BUN 15 01/22/2020   CREATININE 1.13 01/22/2020   GFRNONAA 66 01/22/2020   GFRAA 76 01/22/2020   CALCIUM 10.0 01/22/2020   AST 18 01/22/2020   ALT 23 01/22/2020   The 10-year ASCVD risk score Mikey Bussing DC Jr., et al., 2013) is: 38.6%  ---------------------------------------------------------------------------------------------------  Patient Active Problem List   Diagnosis Date Noted  . Overweight 01/22/2020  . Spondylosis of lumbar region without myelopathy or radiculopathy 03/08/2016  . Arteriosclerosis of coronary artery 06/04/2015  . S/P coronary artery stent placement 06/04/2015  . CAD in native artery 06/04/2015  . Essential (primary) hypertension 05/28/2015  . Hyperlipidemia associated with type 2 diabetes mellitus ([place]) 05/28/2015  . Insomnia 05/28/2015  . 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 Gastro-esophageal reflux disease without esophagitis 05/28/2015  . T2DM (type 2 diabetes mellitus) ([place]) 05/28/2015   Past Medical History:  Diagnosis Date  . Arthritis    osteoarthriis of back  . Coronary artery  disease   . Depression   . Diabetes mellitus without complication ([place])   . GERD (gastroesophageal reflux disease)   . Hyperlipidemia   . Hypertension    Allergies  Allergen Reactions  . Amoxicillin     diarrhea and headache Has patient had a PCN reaction causing  immediate rash, facial/tongue/throat swelling, SOB or lightheadedness with hypotension: No Has patient had a PCN reaction causing severe rash involving mucus membranes or skin necrosis: No Has patient had a PCN reaction that required hospitalization: No Has patient had a PCN reaction occurring within the last 10 years: Unknown If all of the above answers are "NO", then may proceed with Cephalosporin use.   Marland Kitchen Fexofenadine     Unknown reaction        Medications: Outpatient Medications Prior to Visit  Medication Sig  . amLODipine (NORVASC) 5 MG tablet Take 1 tablet (5 mg total) by mouth daily.  Marland Kitchen aspirin 81 MG tablet Take 81 mg by mouth daily.   Marland Kitchen atorvastatin (LIPITOR) 80 MG tablet TAKE ONE (1) TABLET EACH DAY  . blood glucose meter kit and supplies KIT Dispense based on patient and insurance preference. Check sugars once daily  . Calcium Citrate-Vitamin D (CALCIUM + D PO) Take 1 tablet by mouth daily.  . clopidogrel (PLAVIX) 75 MG tablet Take 1 tablet (75 mg total) by mouth daily.  . empagliflozin (JARDIANCE) 25 MG TABS tablet Take 25 mg by mouth daily.  Marland Kitchen ezetimibe (ZETIA) 10 MG tablet Take 1 tablet (10 mg total) by mouth daily.  . fenofibrate (TRICOR) 145 MG tablet Take 1 tablet (145 mg total) by mouth daily.  Marland Kitchen glucose blood (ONETOUCH VERIO) test strip TEST BLOOD GLUCOSE EVERY DAY  . hydrochlorothiazide (HYDRODIURIL) 25 MG tablet Take 1 tablet (25 mg total) by mouth daily.  Marland Kitchen HYDROcodone-acetaminophen (NORCO) 5-325 MG tablet Take 1-2 tablets by mouth every 6 (six) hours as needed for moderate pain.  Marland Kitchen lisinopril (ZESTRIL) 20 MG tablet Take 1 tablet (20 mg total) by mouth daily.  . metFORMIN (GLUCOPHAGE) 1000 MG tablet TAKE ONE (1) TABLET BY MOUTH TWO (2) TIMES DAILY WITH FOOD  . metoprolol tartrate (LOPRESSOR) 50 MG tablet Take 1 tablet (50 mg total) by mouth 2 (two) times daily.  . pantoprazole (PROTONIX) 40 MG tablet TAKE ONE (1) TABLET BY MOUTH EVERY DAY AS NEEDED FOR HEARTBURN OR  ACID REFLUX  . Potassium Gluconate 2.5 MEQ TABS Take by mouth daily.   . [DISCONTINUED] Dulaglutide (TRULICITY) 3 ZO/1.0RU SOPN Inject 0.5 mLs (3 mg total) into the skin once a week.   No facility-administered medications prior to visit.    Review of Systems  Constitutional: Negative.   HENT: Negative.   Respiratory: Negative.   Cardiovascular: Negative.   Musculoskeletal: Positive for arthralgias and neck pain.  Neurological: Negative.   Psychiatric/Behavioral: Negative.        Objective    BP 129/79   Pulse 72   Temp 97.9 F (36.6 C) (Oral)   Resp 16   Wt 208 lb 3.2 oz (94.4 kg)   BMI 28.24 kg/m     Physical Exam Vitals reviewed.  Constitutional:      General: He is not in acute distress.    Appearance: Normal appearance. He is not diaphoretic.  HENT:     Head: Normocephalic and atraumatic.  Eyes:     General: No scleral icterus.    Conjunctiva/sclera: Conjunctivae normal.  Cardiovascular:     Rate and Rhythm: Normal rate and regular  rhythm.     Pulses: Normal pulses.     Heart sounds: Normal heart sounds. No murmur heard.   Pulmonary:     Effort: Pulmonary effort is normal. No respiratory distress.     Breath sounds: Normal breath sounds. No wheezing or rhonchi.  Abdominal:     General: There is no distension.     Palpations: Abdomen is soft.     Tenderness: There is no abdominal tenderness.  Musculoskeletal:     Cervical back: Neck supple.     Right lower leg: No edema.     Left lower leg: No edema.  Lymphadenopathy:     Cervical: No cervical adenopathy.  Skin:    General: Skin is warm and dry.     Capillary Refill: Capillary refill takes less than 2 seconds.     Findings: No rash.  Neurological:     Mental Status: He is alert and oriented to person, place, and time.     Cranial Nerves: No cranial nerve deficit.  Psychiatric:        Mood and Affect: Mood normal.        Behavior: Behavior normal.     Diabetic Foot Exam - Simple   Simple Foot  Form Diabetic Foot exam was performed with the following findings: Yes 08/09/2020  9:34 AM  Visual Inspection No deformities, no ulcerations, no other skin breakdown bilaterally: Yes Sensation Testing Intact to touch and monofilament testing bilaterally: Yes Pulse Check Posterior Tibialis and Dorsalis pulse intact bilaterally: Yes Comments      Results for orders placed or performed in visit on 08/09/20  POCT glycosylated hemoglobin (Hb A1C)  Result Value Ref Range   Hemoglobin A1C 7.4 (A) 4.0 - 5.6 %   HbA1c POC (<> result, manual entry)     HbA1c, POC (prediabetic range)     HbA1c, POC (controlled diabetic range)      Assessment & Plan     Problem List Items Addressed This Visit      Cardiovascular and Mediastinum   Essential (primary) hypertension    Well controlled Continue current medications Recheck metabolic panel F/u in 6 months       Relevant Orders   Comprehensive metabolic panel   CAD in native artery    Followed by cardiology Continue plavix Recheck cbc      Relevant Orders   CBC w/Diff/Platelet     Endocrine   Hyperlipidemia associated with type 2 diabetes mellitus ([place])    Previously well controlled Continue statin, zetia Repeat FLP and CMP Goal LDL < 70      Relevant Medications   Dulaglutide (TRULICITY) 4.5 YJ/8.5UD SOPN   Other Relevant Orders   Comprehensive metabolic panel   Lipid panel   T2DM (type 2 diabetes mellitus) (HCC) - Primary    A1c slightly improving to 7.4, but still uncontrolled Associated with HTN, HLD, CAD, foot exam completed today Upcoming eye exam On ACE inhibitor and statin Continue Jardiance and Metformin at current/max doses Increase Trulicity to 4.5 mg subcu weekly Follow-up in 3 months and repeat A1c      Relevant Medications   Dulaglutide (TRULICITY) 4.5 JS/9.7WY SOPN   Other Relevant Orders   POCT glycosylated hemoglobin (Hb A1C) (Completed)     Other   Overweight    Discussed importance of healthy  weight management Discussed diet and exercise        Other Visit Diagnoses    Long-term use of high-risk medication  Relevant Orders   CBC w/Diff/Platelet   B12       Return in about 6 months (around 02/07/2021) for CPE, AWV.  And 3 months for diabetes f/u     I, Lavon Paganini, MD, have reviewed all documentation for this visit. The documentation on 08/09/20 for the exam, diagnosis, procedures, and orders are all accurate and complete.   Yifan Auker, Dionne Bucy, MD, MPH [facility]

## 2020-08-09 NOTE — Assessment & Plan Note (Signed)
Followed by cardiology Continue plavix Recheck cbc

## 2020-08-09 NOTE — Assessment & Plan Note (Signed)
Well controlled Continue current medications Recheck metabolic panel F/u in 6 months  

## 2020-08-09 NOTE — Assessment & Plan Note (Signed)
A1c slightly improving to 7.4, but still uncontrolled Associated with HTN, HLD, CAD, foot exam completed today Upcoming eye exam On ACE inhibitor and statin Continue Jardiance and Metformin at current/max doses Increase Trulicity to 4.5 mg subcu weekly Follow-up in 3 months and repeat A1c

## 2020-08-09 NOTE — Assessment & Plan Note (Addendum)
Previously well controlled Continue statin, zetia Repeat FLP and CMP Goal LDL < 70  

## 2020-08-09 NOTE — Assessment & Plan Note (Signed)
Discussed importance of healthy weight management Discussed diet and exercise  

## 2020-08-10 ENCOUNTER — Telehealth: Payer: Self-pay

## 2020-08-10 LAB — CBC WITH DIFFERENTIAL/PLATELET
Basophils Absolute: 0.1 10*3/uL (ref 0.0–0.2)
Basos: 1 %
EOS (ABSOLUTE): 0.3 10*3/uL (ref 0.0–0.4)
Eos: 5 %
Hematocrit: 44 % (ref 37.5–51.0)
Hemoglobin: 15.2 g/dL (ref 13.0–17.7)
Immature Grans (Abs): 0 10*3/uL (ref 0.0–0.1)
Immature Granulocytes: 0 %
Lymphocytes Absolute: 2.2 10*3/uL (ref 0.7–3.1)
Lymphs: 32 %
MCH: 30.5 pg (ref 26.6–33.0)
MCHC: 34.5 g/dL (ref 31.5–35.7)
MCV: 88 fL (ref 79–97)
Monocytes Absolute: 0.5 10*3/uL (ref 0.1–0.9)
Monocytes: 7 %
Neutrophils Absolute: 3.7 10*3/uL (ref 1.4–7.0)
Neutrophils: 55 %
Platelets: 231 10*3/uL (ref 150–450)
RBC: 4.98 x10E6/uL (ref 4.14–5.80)
RDW: 13.9 % (ref 11.6–15.4)
WBC: 6.7 10*3/uL (ref 3.4–10.8)

## 2020-08-10 LAB — LIPID PANEL
Chol/HDL Ratio: 5.6 ratio — ABNORMAL HIGH (ref 0.0–5.0)
Cholesterol, Total: 139 mg/dL (ref 100–199)
HDL: 25 mg/dL — ABNORMAL LOW (ref >39)
LDL Chol Calc (NIH): 83 mg/dL (ref 0–99)
Triglycerides: 179 mg/dL — ABNORMAL HIGH (ref 0–149)
VLDL Cholesterol Cal: 31 mg/dL (ref 5–40)

## 2020-08-10 LAB — COMPREHENSIVE METABOLIC PANEL
ALT: 35 IU/L (ref 0–44)
AST: 26 IU/L (ref 0–40)
Albumin/Globulin Ratio: 1.8 (ref 1.2–2.2)
Albumin: 4.8 g/dL (ref 3.8–4.8)
Alkaline Phosphatase: 43 IU/L — ABNORMAL LOW (ref 44–121)
BUN/Creatinine Ratio: 12 (ref 10–24)
BUN: 13 mg/dL (ref 8–27)
Bilirubin Total: 0.5 mg/dL (ref 0.0–1.2)
CO2: 22 mmol/L (ref 20–29)
Calcium: 9.9 mg/dL (ref 8.6–10.2)
Chloride: 101 mmol/L (ref 96–106)
Creatinine, Ser: 1.06 mg/dL (ref 0.76–1.27)
GFR calc Af Amer: 82 mL/min/{1.73_m2} (ref >59)
GFR calc non Af Amer: 71 mL/min/{1.73_m2} (ref >59)
Globulin, Total: 2.6 g/dL (ref 1.5–4.5)
Glucose: 129 mg/dL — ABNORMAL HIGH (ref 65–99)
Potassium: 4 mmol/L (ref 3.5–5.2)
Sodium: 139 mmol/L (ref 134–144)
Total Protein: 7.4 g/dL (ref 6.0–8.5)

## 2020-08-10 LAB — VITAMIN B12: Vitamin B-12: 139 pg/mL — ABNORMAL LOW (ref 232–1245)

## 2020-08-10 NOTE — Telephone Encounter (Signed)
Ok we will continue as he is already at high dose of both.  I recommend diet low in saturated fat and regular exercise - 30 min at least 5 times per week.

## 2020-08-10 NOTE — Telephone Encounter (Signed)
Pt's daughter Maris Berger advised. (On DPR)  She states Mr. Dosher is taking is Zetia and Atorvastatin regularly.   Please advise.   Thanks,   -Vernona Rieger

## 2020-08-10 NOTE — Telephone Encounter (Signed)
-----   Message from Stephen Downer, MD sent at 08/10/2020  1:17 PM EDT ----- Normal labs, except cholesterol still slightly elevated.  Goal is to have LDL, bad cholesterol, less than 70 given his other medical problems.  Lets check and see that he is taking his statin, Zetia regularly.  He is also B12 deficient.  Recommend starting oral B12 supplement OTC daily.  We will recheck at next visit and if it is not improving, will consider B12 injections monthly.

## 2020-08-11 NOTE — Telephone Encounter (Signed)
Tried calling; no answer and the mailbox is full.  PEC please advise pt's daughter Hydrographic surveyor) below  when she calls back.    Thanks,   -Vernona Rieger

## 2020-08-13 NOTE — Telephone Encounter (Signed)
Tried calling; [name]'s mailbox is full.   Thanks,   -Vernona Rieger

## 2020-08-16 ENCOUNTER — Other Ambulatory Visit: Payer: Self-pay | Admitting: Family Medicine

## 2020-08-16 DIAGNOSIS — I10 Essential (primary) hypertension: Secondary | ICD-10-CM | POA: Diagnosis not present

## 2020-08-16 DIAGNOSIS — I2510 Atherosclerotic heart disease of native coronary artery without angina pectoris: Secondary | ICD-10-CM | POA: Diagnosis not present

## 2020-08-16 DIAGNOSIS — Z955 Presence of coronary angioplasty implant and graft: Secondary | ICD-10-CM | POA: Diagnosis not present

## 2020-08-16 DIAGNOSIS — E785 Hyperlipidemia, unspecified: Secondary | ICD-10-CM | POA: Diagnosis not present

## 2020-08-16 MED ORDER — EZETIMIBE 10 MG PO TABS
10.0000 mg | ORAL_TABLET | Freq: Every day | ORAL | 3 refills | Status: DC
Start: 2020-08-16 — End: 2020-11-23

## 2020-08-16 NOTE — Telephone Encounter (Signed)
Pt's daughter called back in returning the office call. Advised per below from provider. Daughter and pt expressed understanding.

## 2020-08-16 NOTE — Telephone Encounter (Signed)
Daughter request refill for ezetimibe (ZETIA) 10 MG tablet on behalf of pt.    Pharmacy:  [facility] #[id number] [place] D[place], [place] - [address] AT [facility] OF [address] [facility] Phone:  [phone]  Fax:  [phone]

## 2020-08-17 ENCOUNTER — Telehealth: Payer: Self-pay

## 2020-08-17 NOTE — Telephone Encounter (Signed)
Copied from CRM [id number]. Topic: General - Other >> Aug 17, 2020  4:23 PM Tamela Oddi wrote: Reason for CRM: Patient's POA, [name], called to request that patient's booster appt. Be rescheduled for Monday if possible.  If there are no appts. Available, patient will keep the current appt.  Please call to discuss at [phone]  [name] that we only give them on Thursday

## 2020-08-19 ENCOUNTER — Other Ambulatory Visit: Payer: Self-pay

## 2020-08-19 ENCOUNTER — Ambulatory Visit (INDEPENDENT_AMBULATORY_CARE_PROVIDER_SITE_OTHER): Payer: PPO

## 2020-08-19 ENCOUNTER — Other Ambulatory Visit: Payer: Self-pay | Admitting: Family Medicine

## 2020-08-19 DIAGNOSIS — Z23 Encounter for immunization: Secondary | ICD-10-CM

## 2020-08-19 DIAGNOSIS — I10 Essential (primary) hypertension: Secondary | ICD-10-CM

## 2020-08-19 NOTE — Telephone Encounter (Signed)
Medication Refill - Medication: Hydrocodone 5/325,  Generic Jardiance 25mg  , Generic Norvasc 5 mg   Has the patient contacted their phardomacy? Yes.     (Agent: If no, request that the patient contact the pharmacy for the refill.) (Agent: If yes, when and what did the pharmacy advise?)  Preferred Pharmacy (with phone number or street name): [facility]  [place]                                Agent: Please be advised that RX refills may take up to 3 business days. We ask that you follow-up with your pharmacy.

## 2020-08-19 NOTE — Telephone Encounter (Signed)
Noted that pt. Is also requesting refill on Amlodipine.

## 2020-08-19 NOTE — Telephone Encounter (Signed)
Requested medication (s) are due for refill today: Hydrocodone, Yes  Requested medication (s) are on the active medication list: yes  Last refill:  07/26/20  Future visit scheduled: yes  Notes to clinic:  not delegated  Requested medication (s) are due for refill today: Empaglifozin, yes   Requested medication (s) are on the active medication list: yes  Last refill:  02/09/20  Future visit schedule: yes  Notes to clinic: insurance requests alternative medication        Requested Prescriptions  Pending Prescriptions Disp Refills   HYDROcodone-acetaminophen (NORCO) 5-325 MG tablet 60 tablet 0    Sig: Take 1-2 tablets by mouth every 6 (six) hours as needed for moderate pain.      Not Delegated - Analgesics:  Opioid Agonist Combinations Failed - 08/19/2020 10:41 AM      Failed - This refill cannot be delegated      Failed - Urine Drug Screen completed in last 360 days.      Passed - Valid encounter within last 6 months    Recent Outpatient Visits           1 week ago Type 2 diabetes mellitus with other specified complication, without long-term current use of insulin [facility])   [facility] [place], Dionne Bucy, MD   3 months ago Type 2 diabetes mellitus with other specified complication, without long-term current use of insulin [facility])   [facility], [place], [place]   6 months ago Acute flank pain   [facility], Dionne Bucy, MD   7 months ago Encounter for annual physical exam   [facility], Dionne Bucy, MD   1 year ago Type 2 diabetes mellitus with other specified complication, without long-term current use of insulin [facility])   [facility], Dionne Bucy, MD       Future Appointments             In 2 months Bacigalupo, Dionne Bucy, MD [facility], [facility]   In 5 months Bacigalupo, Dionne Bucy, MD [facility], [facility]              empagliflozin  (JARDIANCE) 25 MG TABS tablet 30 tablet 5    Sig: Take 1 tablet (25 mg total) by mouth daily.      Endocrinology:  Diabetes - SGLT2 Inhibitors Failed - 08/19/2020 10:41 AM      Failed - LDL in normal range and within 360 days    LDL Chol Calc (NIH)  Date Value Ref Range Status  08/09/2020 83 0 - 99 mg/dL Final          Passed - Cr in normal range and within 360 days    Creatinine, Ser  Date Value Ref Range Status  08/09/2020 1.06 0.76 - 1.27 mg/dL Final          Passed - HBA1C is between 0 and 7.9 and within 180 days    Hemoglobin A1C  Date Value Ref Range Status  08/09/2020 7.4 (A) 4.0 - 5.6 % Final   Hgb A1c MFr Bld  Date Value Ref Range Status  01/22/2020 7.8 (H) 4.8 - 5.6 % Final    Comment:             Prediabetes: 5.7 - 6.4          Diabetes: >6.4          Glycemic control for adults with diabetes: <7.0  Passed - eGFR in normal range and within 360 days    GFR calc Af Amer  Date Value Ref Range Status  08/09/2020 82 >59 mL/min/1.73 Final    Comment:    **In accordance with recommendations from the NKF-ASN Task force,**   [facility] is in the process of updating its eGFR calculation to the   2021 CKD-EPI creatinine equation that estimates kidney function   without a race variable.    GFR calc non Af Amer  Date Value Ref Range Status  08/09/2020 71 >59 mL/min/1.73 Final          Passed - Valid encounter within last 6 months    Recent Outpatient Visits           1 week ago Type 2 diabetes mellitus with other specified complication, without long-term current use of insulin [facility])   [facility] [place], Dionne Bucy, MD   3 months ago Type 2 diabetes mellitus with other specified complication, without long-term current use of insulin [facility]   [place], [place]   6 months ago Acute flank pain   [facility], Dionne Bucy, MD   7 months ago Encounter for annual physical exam    [facility], Dionne Bucy, MD   1 year ago Type 2 diabetes mellitus with other specified complication, without long-term current use of insulin [facility])   [place], Dionne Bucy, MD       Future Appointments             In 2 months Bacigalupo, Dionne Bucy, MD [facility], [place]   In 5 months Bacigalupo, Dionne Bucy, MD [facility], [place]

## 2020-08-23 MED ORDER — AMLODIPINE BESYLATE 5 MG PO TABS: 5.0000 mg | ORAL_TABLET | Freq: Every day | ORAL | 1 refills | Status: DC

## 2020-08-23 MED ORDER — HYDROCODONE-ACETAMINOPHEN 5-325 MG PO TABS: 1.0000 | ORAL_TABLET | Freq: Four times a day (QID) | ORAL | 0 refills | Status: DC | PRN

## 2020-08-23 MED ORDER — EMPAGLIFLOZIN 25 MG PO TABS
25.0000 mg | ORAL_TABLET | Freq: Every day | ORAL | 5 refills | Status: DC
Start: 2020-08-23 — End: 2020-11-23

## 2020-09-20 ENCOUNTER — Other Ambulatory Visit: Payer: Self-pay | Admitting: Family Medicine

## 2020-09-20 NOTE — Telephone Encounter (Signed)
HYDROcodone-acetaminophen (NORCO) 5-325 MG tablet Medication Date: 08/23/2020 Department: [facility] Practice Ordering/Authorizing: Erasmo Downer, MD   [facility] [phone] [place], [place] - [address] AT [facility] OF [address] [facility] Phone:  [phone]  Fax:  [phone]       pls refill last appt 10/18 nxt 1/18

## 2020-09-20 NOTE — Telephone Encounter (Signed)
Please review for refill. Refill not delegated per protocol. Last refill: 08/23/20 #60 Next OV: 11/09/20

## 2020-09-21 MED ORDER — HYDROCODONE-ACETAMINOPHEN 5-325 MG PO TABS: 1.0000 | ORAL_TABLET | Freq: Four times a day (QID) | ORAL | 0 refills | Status: DC | PRN

## 2020-10-25 ENCOUNTER — Other Ambulatory Visit: Payer: Self-pay | Admitting: Family Medicine

## 2020-10-25 NOTE — Telephone Encounter (Signed)
Please review for refill. Refill not delegated per protocol.  Next OV: 11/09/20 Last refill: 09/04/20 #60

## 2020-10-25 NOTE — Telephone Encounter (Signed)
Medication Refill - Medication: Hydrocodone   Has the patient contacted their pharmacy? Yes.   (Agent: If no, request that the patient contact the pharmacy for the refill.) (Agent: If yes, when and what did the pharmacy advise?)  Preferred Pharmacy (with phone number or street name):  [facility] #[id number] [place] D[place], [place] - [address] AT [facility] OF [address] [facility]  [address] [place] [address]  Phone: [phone] Fax: [phone]  Hours: Not open 24 hours     Agent: Please be advised that RX refills may take up to 3 business days. We ask that you follow-up with your pharmacy.

## 2020-10-26 MED ORDER — HYDROCODONE-ACETAMINOPHEN 5-325 MG PO TABS: 1.0000 | ORAL_TABLET | Freq: Four times a day (QID) | ORAL | 0 refills | Status: DC | PRN

## 2020-11-09 ENCOUNTER — Ambulatory Visit: Payer: Self-pay | Admitting: Family Medicine

## 2020-11-09 ENCOUNTER — Telehealth: Payer: Self-pay

## 2020-11-09 NOTE — Telephone Encounter (Signed)
Copied from CRM [id number]. Topic: General - Call Back - No Documentation >> Nov 08, 2020  4:25 PM Randol Kern wrote: Reason for CRM: Pt's daughter called requesting a call back from nurse concerning pt's trulicity shot. Because of the snow she is unable to get to him.   [phone]

## 2020-11-11 ENCOUNTER — Ambulatory Visit: Payer: Self-pay | Admitting: Family Medicine

## 2020-11-23 ENCOUNTER — Other Ambulatory Visit: Payer: Self-pay

## 2020-11-23 ENCOUNTER — Encounter: Payer: Self-pay | Admitting: Family Medicine

## 2020-11-23 ENCOUNTER — Ambulatory Visit (INDEPENDENT_AMBULATORY_CARE_PROVIDER_SITE_OTHER): Payer: PPO | Admitting: Family Medicine

## 2020-11-23 VITALS — BP 126/82 | HR 82 | Temp 98.0°F | Ht 72.0 in | Wt 201.1 lb

## 2020-11-23 DIAGNOSIS — K219 Gastro-esophageal reflux disease without esophagitis: Secondary | ICD-10-CM

## 2020-11-23 DIAGNOSIS — I152 Hypertension secondary to endocrine disorders: Secondary | ICD-10-CM

## 2020-11-23 DIAGNOSIS — E663 Overweight: Secondary | ICD-10-CM

## 2020-11-23 DIAGNOSIS — E1165 Type 2 diabetes mellitus with hyperglycemia: Secondary | ICD-10-CM

## 2020-11-23 DIAGNOSIS — E1159 Type 2 diabetes mellitus with other circulatory complications: Secondary | ICD-10-CM

## 2020-11-23 DIAGNOSIS — E1169 Type 2 diabetes mellitus with other specified complication: Secondary | ICD-10-CM | POA: Diagnosis not present

## 2020-11-23 DIAGNOSIS — E785 Hyperlipidemia, unspecified: Secondary | ICD-10-CM | POA: Diagnosis not present

## 2020-11-23 DIAGNOSIS — I2510 Atherosclerotic heart disease of native coronary artery without angina pectoris: Secondary | ICD-10-CM

## 2020-11-23 LAB — POCT GLYCOSYLATED HEMOGLOBIN (HGB A1C)
Estimated Average Glucose: 171
Hemoglobin A1C: 7.6 % — AB (ref 4.0–5.6)

## 2020-11-23 MED ORDER — FENOFIBRATE 145 MG PO TABS: 145.0000 mg | ORAL_TABLET | Freq: Every day | ORAL | 1 refills | Status: DC

## 2020-11-23 MED ORDER — AMLODIPINE BESYLATE 5 MG PO TABS: 5.0000 mg | ORAL_TABLET | Freq: Every day | ORAL | 1 refills | Status: DC

## 2020-11-23 MED ORDER — METFORMIN HCL 1000 MG PO TABS: ORAL_TABLET | ORAL | 1 refills | Status: DC

## 2020-11-23 MED ORDER — METOPROLOL TARTRATE 50 MG PO TABS: 50.0000 mg | ORAL_TABLET | Freq: Two times a day (BID) | ORAL | 1 refills | Status: DC

## 2020-11-23 MED ORDER — HYDROCODONE-ACETAMINOPHEN 5-325 MG PO TABS: 1.0000 | ORAL_TABLET | Freq: Four times a day (QID) | ORAL | 0 refills | Status: DC | PRN

## 2020-11-23 MED ORDER — LISINOPRIL 20 MG PO TABS: 20.0000 mg | ORAL_TABLET | Freq: Every day | ORAL | 3 refills | Status: DC

## 2020-11-23 MED ORDER — PANTOPRAZOLE SODIUM 40 MG PO TBEC: DELAYED_RELEASE_TABLET | ORAL | 1 refills | Status: DC

## 2020-11-23 MED ORDER — ATORVASTATIN CALCIUM 80 MG PO TABS: ORAL_TABLET | ORAL | 3 refills | Status: DC

## 2020-11-23 MED ORDER — CLOPIDOGREL BISULFATE 75 MG PO TABS: 75.0000 mg | ORAL_TABLET | Freq: Every day | ORAL | 1 refills | Status: DC

## 2020-11-23 MED ORDER — EZETIMIBE 10 MG PO TABS: 10.0000 mg | ORAL_TABLET | Freq: Every day | ORAL | 3 refills | Status: DC

## 2020-11-23 MED ORDER — HYDROCHLOROTHIAZIDE 25 MG PO TABS: 25.0000 mg | ORAL_TABLET | Freq: Every day | ORAL | 1 refills | Status: DC

## 2020-11-23 MED ORDER — EMPAGLIFLOZIN 25 MG PO TABS: 25.0000 mg | ORAL_TABLET | Freq: Every day | ORAL | 3 refills | Status: DC

## 2020-11-23 MED ORDER — TRULICITY 4.5 MG/0.5ML ~~LOC~~ SOAJ: 4.5000 mg | SUBCUTANEOUS | 5 refills | Status: DC

## 2020-11-23 NOTE — Assessment & Plan Note (Signed)
Well controlled Continue current medications Recheck metabolic panel F/u in 3 months  

## 2020-11-23 NOTE — Assessment & Plan Note (Signed)
Remains uncontrolled with hyperglycemia Associated with HTN, HLD, CAD Up-to-date on foot exam On ACE inhibitor and statin Upcoming eye exam Continue Jardiance, metformin, Trulicity-all at max doses Discussed possibility of needing to add insulin, but patient is hesitant He reports he will work on diet and exercise over the next 3 months and 1 week follow-up we will repeat A1c and make further decisions at that time We could consider CCM services to see if closer monitoring and checking with the patient about his medications may help with improving A1c

## 2020-11-23 NOTE — Assessment & Plan Note (Signed)
Continue fenofibrate,Zetia, statin Goal LDL <70 Recheck CMP and FLP at CPE in ~3 months Also f/b Cardiology

## 2020-11-23 NOTE — Progress Notes (Signed)
Established Patient Office Visit  Subjective:  Patient ID: [name], male    DOB: 04/04/1951  Age: 70 y.o. MRN: [id number]  CC:  Chief Complaint  Patient presents with  . Diabetes    HPI [name] presents for 3 m/o diabetes f/u.  Diabetes Mellitus Type II, Follow-up  Lab Results  Component Value Date   HGBA1C 7.6 (A) 11/23/2020   HGBA1C 7.4 (A) 08/09/2020   HGBA1C 7.8 (A) 05/07/2020   Wt Readings from Last 3 Encounters:  11/23/20 201 lb 1.6 oz (91.2 kg)  08/09/20 208 lb 3.2 oz (94.4 kg)  05/07/20 203 lb 3.2 oz (92.2 kg)   Last seen for diabetes 3 months ago.       Management since then includes continue Jardiance and Metformin at current/max doses, and increase Trulicity to 4.5 mg subcu weekly. He reports excellent compliance with treatment. He is not having side effects.  Symptoms: No fatigue No foot ulcerations  No appetite changes No nausea  No paresthesia of the feet  No polydipsia  No polyuria No visual disturbances   No vomiting     Home blood sugar records: 144 when fasting  Episodes of hypoglycemia? No    Current insulin regiment:  Most Recent Eye Exam: 11/24/2020 Current exercise: walking Current diet habits: well balanced  Pertinent Labs: Lab Results  Component Value Date   CHOL 139 08/09/2020   HDL 25 (L) 08/09/2020   LDLCALC 83 08/09/2020   TRIG 179 (H) 08/09/2020   CHOLHDL 5.6 (H) 08/09/2020   Lab Results  Component Value Date   NA 139 08/09/2020   K 4.0 08/09/2020   CREATININE 1.06 08/09/2020   GFRNONAA 71 08/09/2020   GFRAA 82 08/09/2020   GLUCOSE 129 (H) 08/09/2020     ---------------------------------------------------------------------------------------------------  Past Medical History:  Diagnosis Date  . Arthritis    osteoarthriis of back  . Coronary artery disease   . Depression   . Diabetes mellitus without complication ([name])   . GERD (gastroesophageal reflux disease)   . Hyperlipidemia   . Hypertension      Past Surgical History:  Procedure Laterality Date  . CATARACT EXTRACTION W/PHACO Right 03/01/2017   Procedure: CATARACT EXTRACTION PHACO AND INTRAOCULAR LENS PLACEMENT (IOC);  Surgeon: Eulogio Bear, MD;  Location: [facility] OR[facility];  Service: Ophthalmology;  Laterality: Right;  [place] 22.6 AP% 6.1 CDE 1.37 Fluid pack lot # [id number]  . CATARACT EXTRACTION W/PHACO Left 05/01/2017   Procedure: CATARACT EXTRACTION PHACO AND INTRAOCULAR LENS PLACEMENT (IOC)  Left Diabetic;  Surgeon: Eulogio Bear, MD;  Location: [facility];  Service: Ophthalmology;  Laterality: Left;  Block Diabetic  . CORONARY ANGIOPLASTY     STENT 2009  . POSTERIOR CERVICAL LAMINECTOMY N/A 02/25/2018   Procedure: POSTERIOR CERVICAL LAMINECTOMY-CERVICAL 3;  Surgeon: Kathlene November, MD;  Location: [facility] ORS;  Service: Neurosurgery;  Laterality: N/A;  . Stint  2007    Family History  Problem Relation Age of Onset  . Diabetes Mother        mellitus type2    Social History   Socioeconomic History  . Marital status: Divorced    Spouse name: Not on file  . Number of children: 2  . Years of education: Not on file  . Highest education level: 9th grade  Occupational History  . Occupation: retired  Tobacco Use  . Smoking status: Former Smoker    Types: Cigars    Quit date: 05/06/2020    Years since quitting:  0.5  . Smokeless tobacco: Never Used  . Tobacco comment: smokes cigars-about 10-15 cigars a week, quit cigarettes 20+ years ago  Vaping Use  . Vaping Use: Never used  Substance and Sexual Activity  . Alcohol use: No  . Drug use: No  . Sexual activity: Not on file  Other Topics Concern  . Not on file  Social History Narrative  . Not on file   Social Determinants of Health   Financial Resource Strain: Low Risk   . Difficulty of Paying Living Expenses: Not hard at all  Food Insecurity: No Food Insecurity  . Worried About Charity fundraiser in the Last Year: Never true  . Ran Out of Food in the  Last Year: Never true  Transportation Needs: No Transportation Needs  . Lack of Transportation (Medical): No  . Lack of Transportation (Non-Medical): No  Physical Activity: Insufficiently Active  . Days of Exercise per Week: 2 days  . Minutes of Exercise per Session: 30 min  Stress: No Stress Concern Present  . Feeling of Stress : Not at all  Social Connections: Moderately Isolated  . Frequency of Communication with Friends and Family: More than three times a week  . Frequency of Social Gatherings with Friends and Family: More than three times a week  . Attends Religious Services: More than 4 times per year  . Active Member of Clubs or Organizations: No  . Attends Archivist Meetings: Never  . Marital Status: Divorced  Human resources officer Violence: Not At Risk  . Fear of Current or Ex-Partner: No  . Emotionally Abused: No  . Physically Abused: No  . Sexually Abused: No    Outpatient Medications Prior to Visit  Medication Sig Dispense Refill  . aspirin 81 MG tablet Take 81 mg by mouth daily.     . blood glucose meter kit and supplies KIT Dispense based on patient and insurance preference. Check sugars once daily 1 each 0  . Calcium Citrate-Vitamin D (CALCIUM + D PO) Take 1 tablet by mouth daily.    Marland Kitchen glucose blood (ONETOUCH VERIO) test strip TEST BLOOD GLUCOSE EVERY DAY 50 each 5  . Potassium Gluconate 2.5 MEQ TABS Take by mouth daily.     Marland Kitchen amLODipine (NORVASC) 5 MG tablet Take 1 tablet (5 mg total) by mouth daily. 90 tablet 1  . atorvastatin (LIPITOR) 80 MG tablet TAKE ONE (1) TABLET EACH DAY 90 tablet 3  . clopidogrel (PLAVIX) 75 MG tablet Take 1 tablet (75 mg total) by mouth daily. 90 tablet 1  . Dulaglutide (TRULICITY) 4.5 HE/5.2DP SOPN Inject 4.5 mg as directed once a week. 2 mL 5  . empagliflozin (JARDIANCE) 25 MG TABS tablet Take 1 tablet (25 mg total) by mouth daily. 30 tablet 5  . ezetimibe (ZETIA) 10 MG tablet Take 1 tablet (10 mg total) by mouth daily. 90 tablet 3   . fenofibrate (TRICOR) 145 MG tablet Take 1 tablet (145 mg total) by mouth daily. 90 tablet 1  . hydrochlorothiazide (HYDRODIURIL) 25 MG tablet Take 1 tablet (25 mg total) by mouth daily. 90 tablet 1  . HYDROcodone-acetaminophen (NORCO) 5-325 MG tablet Take 1-2 tablets by mouth every 6 (six) hours as needed for moderate pain. 60 tablet 0  . lisinopril (ZESTRIL) 20 MG tablet Take 1 tablet (20 mg total) by mouth daily. 90 tablet 3  . metFORMIN (GLUCOPHAGE) 1000 MG tablet TAKE ONE (1) TABLET BY MOUTH TWO (2) TIMES DAILY WITH FOOD 180 tablet 1  .  metoprolol tartrate (LOPRESSOR) 50 MG tablet Take 1 tablet (50 mg total) by mouth 2 (two) times daily. 180 tablet 1  . pantoprazole (PROTONIX) 40 MG tablet TAKE ONE (1) TABLET BY MOUTH EVERY DAY AS NEEDED FOR HEARTBURN OR ACID REFLUX 90 tablet 1   No facility-administered medications prior to visit.    Allergies  Allergen Reactions  . Amoxicillin     diarrhea and headache Has patient had a PCN reaction causing immediate rash, facial/tongue/throat swelling, SOB or lightheadedness with hypotension: No Has patient had a PCN reaction causing severe rash involving mucus membranes or skin necrosis: No Has patient had a PCN reaction that required hospitalization: No Has patient had a PCN reaction occurring within the last 10 years: Unknown If all of the above answers are "NO", then may proceed with Cephalosporin use.   Marland Kitchen Fexofenadine     Unknown reaction     ROS Review of Systems  Respiratory: Negative.   Cardiovascular: Negative.   Neurological: Positive for numbness.      Objective:    Physical Exam Vitals reviewed.  Constitutional:      General: He is not in acute distress.    Appearance: Normal appearance.  HENT:     Head: Normocephalic and atraumatic.  Eyes:     Conjunctiva/sclera: Conjunctivae normal.  Cardiovascular:     Rate and Rhythm: Normal rate and regular rhythm.     Heart sounds: Normal heart sounds. No murmur  heard.   Pulmonary:     Effort: Pulmonary effort is normal. No respiratory distress.     Breath sounds: Normal breath sounds. No wheezing.  Abdominal:     General: There is no distension.     Palpations: Abdomen is soft.  Musculoskeletal:     Right lower leg: No edema.     Left lower leg: No edema.  Skin:    Findings: No rash.  Neurological:     Mental Status: He is alert and oriented to person, place, and time.  Psychiatric:        Behavior: Behavior normal.     BP 126/82 (BP Location: Right Arm, Patient Position: Sitting, Cuff Size: Large)   Pulse 82   Temp 98 F (36.7 C) (Oral)   Ht 6' (1.829 m)   Wt 201 lb 1.6 oz (91.2 kg)   BMI 27.27 kg/m  Wt Readings from Last 3 Encounters:  11/23/20 201 lb 1.6 oz (91.2 kg)  08/09/20 208 lb 3.2 oz (94.4 kg)  05/07/20 203 lb 3.2 oz (92.2 kg)     Health Maintenance Due  Topic Date Due  . OPHTHALMOLOGY EXAM  07/16/2020  . Fecal DNA (Cologuard)  10/29/2020    There are no preventive care reminders to display for this patient.  No results found for: TSH Lab Results  Component Value Date   WBC 6.7 08/09/2020   HGB 15.2 08/09/2020   HCT 44.0 08/09/2020   MCV 88 08/09/2020   PLT 231 08/09/2020   Lab Results  Component Value Date   NA 139 08/09/2020   K 4.0 08/09/2020   CO2 22 08/09/2020   GLUCOSE 129 (H) 08/09/2020   BUN 13 08/09/2020   CREATININE 1.06 08/09/2020   BILITOT 0.5 08/09/2020   ALKPHOS 43 (L) 08/09/2020   AST 26 08/09/2020   ALT 35 08/09/2020   PROT 7.4 08/09/2020   ALBUMIN 4.8 08/09/2020   CALCIUM 9.9 08/09/2020   ANIONGAP 4 (L) 02/26/2018   Lab Results  Component Value Date   CHOL  139 08/09/2020   Lab Results  Component Value Date   HDL 25 (L) 08/09/2020   Lab Results  Component Value Date   LDLCALC 83 08/09/2020   Lab Results  Component Value Date   TRIG 179 (H) 08/09/2020   Lab Results  Component Value Date   CHOLHDL 5.6 (H) 08/09/2020   Lab Results  Component Value Date    HGBA1C 7.6 (A) 11/23/2020      Assessment & Plan:   Problem List Items Addressed This Visit      Cardiovascular and Mediastinum   Hypertension associated with diabetes ([place])    Well controlled Continue current medications Recheck metabolic panel F/u in 3 months       Relevant Medications   empagliflozin (JARDIANCE) 25 MG TABS tablet   ezetimibe (ZETIA) 10 MG tablet   amLODipine (NORVASC) 5 MG tablet   atorvastatin (LIPITOR) 80 MG tablet   Dulaglutide (TRULICITY) 4.5 JO/8.4ZY SOPN   fenofibrate (TRICOR) 145 MG tablet   hydrochlorothiazide (HYDRODIURIL) 25 MG tablet   lisinopril (ZESTRIL) 20 MG tablet   metFORMIN (GLUCOPHAGE) 1000 MG tablet   metoprolol tartrate (LOPRESSOR) 50 MG tablet   Arteriosclerosis of coronary artery   Relevant Medications   ezetimibe (ZETIA) 10 MG tablet   amLODipine (NORVASC) 5 MG tablet   atorvastatin (LIPITOR) 80 MG tablet   clopidogrel (PLAVIX) 75 MG tablet   fenofibrate (TRICOR) 145 MG tablet   hydrochlorothiazide (HYDRODIURIL) 25 MG tablet   lisinopril (ZESTRIL) 20 MG tablet   metoprolol tartrate (LOPRESSOR) 50 MG tablet     Endocrine   Hyperlipidemia associated with type 2 diabetes mellitus (HCC)    Continue fenofibrate,Zetia, statin Goal LDL <70 Recheck CMP and FLP at CPE in ~3 months Also f/b Cardiology      Relevant Medications   empagliflozin (JARDIANCE) 25 MG TABS tablet   ezetimibe (ZETIA) 10 MG tablet   atorvastatin (LIPITOR) 80 MG tablet   Dulaglutide (TRULICITY) 4.5 SA/6.3KZ SOPN   fenofibrate (TRICOR) 145 MG tablet   lisinopril (ZESTRIL) 20 MG tablet   metFORMIN (GLUCOPHAGE) 1000 MG tablet   T2DM (type 2 diabetes mellitus) (HCC) - Primary    Remains uncontrolled with hyperglycemia Associated with HTN, HLD, CAD Up-to-date on foot exam On ACE inhibitor and statin Upcoming eye exam Continue Jardiance, metformin, Trulicity-all at max doses Discussed possibility of needing to add insulin, but patient is hesitant He  reports he will work on diet and exercise over the next 3 months and 1 week follow-up we will repeat A1c and make further decisions at that time We could consider CCM services to see if closer monitoring and checking with the patient about his medications may help with improving A1c      Relevant Medications   empagliflozin (JARDIANCE) 25 MG TABS tablet   atorvastatin (LIPITOR) 80 MG tablet   Dulaglutide (TRULICITY) 4.5 SW/1.0XN SOPN   lisinopril (ZESTRIL) 20 MG tablet   metFORMIN (GLUCOPHAGE) 1000 MG tablet   Other Relevant Orders   POCT HgB A1C (Completed)     Other   Overweight    Discussed importance of healthy weight management Discussed diet and exercise        Other Visit Diagnoses    Gastroesophageal reflux disease without esophagitis       Relevant Medications   pantoprazole (PROTONIX) 40 MG tablet       Follow-up: Return in about 3 months (around 02/20/2021) for CPE, AWV.   I, Lavon Paganini, MD, have reviewed all documentation for this  visit. The documentation on 11/23/20 for the exam, diagnosis, procedures, and orders are all accurate and complete.   Bacigalupo, Dionne Bucy, MD, MPH [facility]

## 2020-11-23 NOTE — Assessment & Plan Note (Signed)
Discussed importance of healthy weight management Discussed diet and exercise  

## 2020-12-01 DIAGNOSIS — E119 Type 2 diabetes mellitus without complications: Secondary | ICD-10-CM | POA: Diagnosis not present

## 2020-12-01 LAB — HM DIABETES EYE EXAM

## 2020-12-22 ENCOUNTER — Other Ambulatory Visit: Payer: Self-pay | Admitting: Family Medicine

## 2020-12-22 NOTE — Telephone Encounter (Signed)
Requested medication (s) are due for refill today - yes  Requested medication (s) are on the active medication list -yes  Future visit scheduled -yes  Last refill: 11/23/20  Notes to clinic: Request non delegated Rx  Requested Prescriptions  Pending Prescriptions Disp Refills   HYDROcodone-acetaminophen (NORCO) 5-325 MG tablet 60 tablet 0    Sig: Take 1-2 tablets by mouth every 6 (six) hours as needed for moderate pain.      Not Delegated - Analgesics:  Opioid Agonist Combinations Failed - 12/22/2020  4:07 PM      Failed - This refill cannot be delegated      Failed - Urine Drug Screen completed in last 360 days      Passed - Valid encounter within last 6 months    Recent Outpatient Visits           4 weeks ago Type 2 diabetes mellitus with hyperglycemia, without long-term current use of insulin [facility])   [facility] [place], Marzella Schlein, MD   4 months ago Type 2 diabetes mellitus with other specified complication, without long-term current use of insulin [facility])   [facility] [place], Marzella Schlein, MD   7 months ago Type 2 diabetes mellitus with other specified complication, without long-term current use of insulin [facility])   [facility], [place], [place]   10 months ago Acute flank pain   [facility] [place], Marzella Schlein, MD   11 months ago Encounter for annual physical exam   [facility] [place], Marzella Schlein, MD       Future Appointments             In 2 months Bacigalupo, Marzella Schlein, MD [facility], [place] [facility]                 Requested Prescriptions  Pending Prescriptions Disp Refills   HYDROcodone-acetaminophen (NORCO) 5-325 MG tablet 60 tablet 0    Sig: Take 1-2 tablets by mouth every 6 (six) hours as needed for moderate pain.      Not Delegated - Analgesics:  Opioid Agonist Combinations Failed - 12/22/2020  4:07 PM      Failed - This refill cannot be delegated      Failed -  Urine Drug Screen completed in last 360 days      Passed - Valid encounter within last 6 months    Recent Outpatient Visits           4 weeks ago Type 2 diabetes mellitus with hyperglycemia, without long-term current use of insulin [facility])   [facility] [place], Marzella Schlein, MD   4 months ago Type 2 diabetes mellitus with other specified complication, without long-term current use of insulin [facility])   [facility] [place], Marzella Schlein, MD   7 months ago Type 2 diabetes mellitus with other specified complication, without long-term current use of insulin [facility])   [facility] [place], [place]   10 months ago Acute flank pain   [facility] [place], Marzella Schlein, MD   11 months ago Encounter for annual physical exam   [facility], Marzella Schlein, MD       Future Appointments             In 2 months Bacigalupo, Marzella Schlein, MD [facility], [facility]

## 2020-12-22 NOTE — Telephone Encounter (Signed)
Medication Refill - Medication: Hydrocodone   Has the patient contacted their pharmacy? Yes.   Pts daughter states that she contacted pharmacy and they asked her to contact PCP. Please advise. (Agent: If no, request that the patient contact the pharmacy for the refill.) (Agent: If yes, when and what did the pharmacy advise?)  Preferred Pharmacy (with phone number or street name):  [id number] [place], [place] - [address] AT [facility] [address] [facility]  [address] [place] [address]  Phone: [phone] Fax: [phone]  Hours: Not open 24 hours     Agent: Please be advised that RX refills may take up to 3 business days. We ask that you follow-up with your pharmacy.

## 2020-12-24 MED ORDER — HYDROCODONE-ACETAMINOPHEN 5-325 MG PO TABS
1.0000 | ORAL_TABLET | Freq: Four times a day (QID) | ORAL | 0 refills | Status: DC | PRN
Start: 2020-12-24 — End: 2021-01-21

## 2021-01-06 ENCOUNTER — Telehealth: Payer: Self-pay

## 2021-01-06 NOTE — Telephone Encounter (Signed)
PT daughter has called back wanting to speak specifically with Dr Senaida Lange nurse, offered one of the triage nurse, wants to speak to Dr's B's nurse only

## 2021-01-06 NOTE — Telephone Encounter (Signed)
NA

## 2021-01-06 NOTE — Telephone Encounter (Signed)
Copied from CRM [id number]. Topic: General - Other >> Jan 06, 2021  3:09 PM Gwenlyn Fudge wrote: Reason for CRM: Pts daughter called and is requesting to speak with nurse regarding one of the pts medication and how it is written out. Please advise.

## 2021-01-07 NOTE — Telephone Encounter (Signed)
NA, mailbox is full. 

## 2021-01-10 ENCOUNTER — Telehealth: Payer: Self-pay

## 2021-01-10 NOTE — Telephone Encounter (Signed)
Copied from CRM [id number]. Topic: General - Other >> Jan 06, 2021  3:09 PM Gwenlyn Fudge wrote: Reason for CRM: Pts daughter called and is requesting to speak with nurse regarding one of the pts medication and how it is written out. Please advise. >> Jan 10, 2021 12:15 PM Daphine Deutscher D wrote: Pt's daughter Aggie Cosier called back asking to speak with a nurse regarding her fathers medications.  CB#  [phone]

## 2021-01-11 NOTE — Telephone Encounter (Signed)
Returned patients daughter Aggie Cosier) call and she states that she spoke with [place] today,01/11/2021 in the office. Also she states that the patient is complaining about leg pain since he has started moving around a lot more. She states that patient would like to increase his Norco doses if possible. Please advise.

## 2021-01-11 NOTE — Telephone Encounter (Signed)
I spoke with the patient's daughter and it sounds like the insurance is in need of a prior authorization.  She is going to bring in the paperwork she has been getting so I can look at it and know for sure what is needed.    It is of note that the daughter also mentioned that he is having to take extra Tylenol due to pain but it hasn't really been helping.  He complains of increased pain in legs and hands.  Advised that he would need to have visit to discuss this with the provider.

## 2021-01-13 NOTE — Telephone Encounter (Signed)
Would need to be seen to increase pain meds. Also would recommend them considering pain management referral to Dr Cherylann Ratel

## 2021-01-21 ENCOUNTER — Other Ambulatory Visit: Payer: Self-pay | Admitting: Family Medicine

## 2021-01-21 NOTE — Telephone Encounter (Signed)
Medication Refill - Medication: HYDROcodone-acetaminophen (NORCO) 5-325 MG tablet    Has the patient contacted their pharmacy? No. (Agent: If no, request that the patient contact the pharmacy for the refill.) (Agent: If yes, when and what did the pharmacy advise?)  Preferred Pharmacy (with phone number or street name): [id number] [place]ree D[place], [place] - [address] AT [facility] OF [address] [facility]  [address] [place], [place] [address]  Phone:  [phone] Fax:  [phone]   Agent: Please be advised that RX refills may take up to 3 business days. We ask that you follow-up with your pharmacy.

## 2021-01-21 NOTE — Telephone Encounter (Signed)
Requested medication (s) are due for refill today: yes  Requested medication (s) are on the active medication list: yes  Last refill:  12/24/20 #60 0 refills  Future visit scheduled: yes  Notes to clinic:  not delegated per protocol     Requested Prescriptions  Pending Prescriptions Disp Refills   HYDROcodone-acetaminophen (NORCO) 5-325 MG tablet 60 tablet 0    Sig: Take 1-2 tablets by mouth every 6 (six) hours as needed for moderate pain.      Not Delegated - Analgesics:  Opioid Agonist Combinations Failed - 01/21/2021  4:02 PM      Failed - This refill cannot be delegated      Failed - Urine Drug Screen completed in last 360 days      Passed - Valid encounter within last 6 months    Recent Outpatient Visits           1 month ago Type 2 diabetes mellitus with hyperglycemia, without long-term current use of insulin [facility])   [facility] [place], Marzella Schlein, MD   5 months ago Type 2 diabetes mellitus with other specified complication, without long-term current use of insulin [facility])   [facility] [place], Marzella Schlein, MD   8 months ago Type 2 diabetes mellitus with other specified complication, without long-term current use of insulin [facility])   [facility] Joy[place] Man [place], [place]   11 months ago Acute flank pain   [facility] [place], Marzella Schlein, MD   1 year ago Encounter for annual physical exam   [facility] Bacigalupo, Marzella Schlein, MD       Future Appointments             In 1 month Bacigalupo, Marzella Schlein, MD [facility]

## 2021-01-24 MED ORDER — HYDROCODONE-ACETAMINOPHEN 5-325 MG PO TABS: 1.0000 | ORAL_TABLET | Freq: Four times a day (QID) | ORAL | 0 refills | Status: DC | PRN

## 2021-01-24 NOTE — Progress Notes (Signed)
Subjective:   [name] is a 70 y.o. male who presents for Medicare Annual/Subsequent preventive examination.  I connected with [name] today by telephone and verified that I am speaking with the correct person using two identifiers. Location patient: home Location provider: work Persons participating in the virtual visit: patient, provider.   I discussed the limitations, risks, security and privacy concerns of performing an evaluation and management service by telephone and the availability of in person appointments. I also discussed with the patient that there may be a patient responsible charge related to this service. The patient expressed understanding and verbally consented to this telephonic visit.    Interactive audio and video telecommunications were attempted between this provider and patient, however failed, due to patient having technical difficulties OR patient did not have access to video capability.  We continued and completed visit with audio only.   Review of Systems    N/A  Cardiac Risk Factors include: advanced age (>3men, >17 women);diabetes mellitus;dyslipidemia;male gender;hypertension     Objective:    Today's Vitals   01/25/21 1431  PainSc: 5    There is no height or weight on file to calculate BMI.  Advanced Directives 01/25/2021 01/20/2020 02/25/2018 02/25/2018 02/21/2018 02/21/2018 12/29/2017  Does Patient Have a Medical Advance Directive? Yes Yes - Yes - Yes Yes  Type of Advance Directive [place];Living will [place];Living will - [place];Living will - [place];Living will [place];Living will  Does patient want to make changes to medical advance directive? - - No - Patient declined - - No - Patient declined No - Patient declined  Copy of [place] in Chart? Yes - validated most recent copy scanned in chart (See row information) Yes -  validated most recent copy scanned in chart (See row information) No - copy requested - Yes No - copy requested No - copy requested    Current Medications (verified) Outpatient Encounter Medications as of 01/25/2021  Medication Sig  . amLODipine (NORVASC) 5 MG tablet Take 1 tablet (5 mg total) by mouth daily.  Marland Kitchen aspirin 81 MG tablet Take 81 mg by mouth daily.   Marland Kitchen atorvastatin (LIPITOR) 80 MG tablet TAKE ONE (1) TABLET EACH DAY  . blood glucose meter kit and supplies KIT Dispense based on patient and insurance preference. Check sugars once daily  . Calcium Citrate-Vitamin D (CALCIUM + D PO) Take 1 tablet by mouth daily.  . clopidogrel (PLAVIX) 75 MG tablet Take 1 tablet (75 mg total) by mouth daily.  . Dulaglutide (TRULICITY) 4.5 MW/1.0UV SOPN Inject 4.5 mg as directed once a week.  . empagliflozin (JARDIANCE) 25 MG TABS tablet Take 1 tablet (25 mg total) by mouth daily.  Marland Kitchen ezetimibe (ZETIA) 10 MG tablet Take 1 tablet (10 mg total) by mouth daily.  . fenofibrate (TRICOR) 145 MG tablet Take 1 tablet (145 mg total) by mouth daily.  Marland Kitchen glucose blood (ONETOUCH VERIO) test strip TEST BLOOD GLUCOSE EVERY DAY  . hydrochlorothiazide (HYDRODIURIL) 25 MG tablet Take 1 tablet (25 mg total) by mouth daily.  Marland Kitchen HYDROcodone-acetaminophen (NORCO) 5-325 MG tablet Take 1-2 tablets by mouth every 6 (six) hours as needed for moderate pain.  Marland Kitchen lisinopril (ZESTRIL) 20 MG tablet Take 1 tablet (20 mg total) by mouth daily.  . metFORMIN (GLUCOPHAGE) 1000 MG tablet TAKE ONE (1) TABLET BY MOUTH TWO (2) TIMES DAILY WITH FOOD  . metoprolol tartrate (LOPRESSOR) 50 MG  tablet Take 1 tablet (50 mg total) by mouth 2 (two) times daily.  . pantoprazole (PROTONIX) 40 MG tablet TAKE ONE (1) TABLET BY MOUTH EVERY DAY AS NEEDED FOR HEARTBURN OR ACID REFLUX  . Potassium Gluconate 2.5 MEQ TABS Take by mouth daily.    No facility-administered encounter medications on file as of 01/25/2021.    Allergies (verified) Amoxicillin and  Fexofenadine   History: Past Medical History:  Diagnosis Date  . Arthritis    osteoarthriis of back  . Coronary artery disease   . Depression   . Diabetes mellitus without complication ([place])   . GERD (gastroesophageal reflux disease)   . Hyperlipidemia   . Hypertension    Past Surgical History:  Procedure Laterality Date  . CATARACT EXTRACTION W/PHACO Right 03/01/2017   Procedure: CATARACT EXTRACTION PHACO AND INTRAOCULAR LENS PLACEMENT (IOC);  Surgeon: Eulogio Bear, MD;  Location: [facility];  Service: Ophthalmology;  Laterality: Right;  [place] 22.6 AP% 6.1 CDE 1.37 Fluid pack lot # [id number]  . CATARACT EXTRACTION W/PHACO Left 05/01/2017   Procedure: CATARACT EXTRACTION PHACO AND INTRAOCULAR LENS PLACEMENT (IOC)  Left Diabetic;  Surgeon: Eulogio Bear, MD;  Location: [place];  Service: Ophthalmology;  Laterality: Left;  Block Diabetic  . CORONARY ANGIOPLASTY     STENT 2009  . POSTERIOR CERVICAL LAMINECTOMY N/A 02/25/2018   Procedure: POSTERIOR CERVICAL LAMINECTOMY-CERVICAL 3;  Surgeon: Kathlene November, MD;  Location: [facility];  Service: Neurosurgery;  Laterality: N/A;  . Stint  2007   Family History  Problem Relation Age of Onset  . Diabetes Mother        mellitus type2   Social History   Socioeconomic History  . Marital status: Divorced    Spouse name: Not on file  . Number of children: 2  . Years of education: Not on file  . Highest education level: 9th grade  Occupational History  . Occupation: retired  Tobacco Use  . Smoking status: Former Smoker    Types: Cigars    Quit date: 05/06/2020    Years since quitting: 0.7  . Smokeless tobacco: Never Used  . Tobacco comment: smokes cigars-about 10-15 cigars a week, quit cigarettes 20+ years ago  Vaping Use  . Vaping Use: Never used  Substance and Sexual Activity  . Alcohol use: No  . Drug use: No  . Sexual activity: Not on file  Other Topics Concern  . Not on file  Social History Narrative  .  Not on file   Social Determinants of Health   Financial Resource Strain: Low Risk   . Difficulty of Paying Living Expenses: Not hard at all  Food Insecurity: No Food Insecurity  . Worried About Charity fundraiser in the Last Year: Never true  . Ran Out of Food in the Last Year: Never true  Transportation Needs: No Transportation Needs  . Lack of Transportation (Medical): No  . Lack of Transportation (Non-Medical): No  Physical Activity: Inactive  . Days of Exercise per Week: 0 days  . Minutes of Exercise per Session: 0 min  Stress: No Stress Concern Present  . Feeling of Stress : Not at all  Social Connections: Moderately Isolated  . Frequency of Communication with Friends and Family: More than three times a week  . Frequency of Social Gatherings with Friends and Family: Three times a week  . Attends Religious Services: Never  . Active Member of Clubs or Organizations: No  . Attends Archivist Meetings: Never  .  Marital Status: Married    Tobacco Counseling Counseling given: Not Answered Comment: smokes cigars-about 10-15 cigars a week, quit cigarettes 20+ years ago   Clinical Intake:  Pre-visit preparation completed: Yes  Pain : 0-10 Pain Score: 5  Pain Type: Chronic pain Pain Location: Leg Pain Orientation: Lower,Right,Left Pain Descriptors / Indicators: Aching Pain Frequency: Intermittent Pain Relieving Factors: Takes Norco as needed for pain.  Pain Relieving Factors: Takes Norco as needed for pain.  Nutritional Risks: None Diabetes: Yes  How often do you need to have someone help you when you read instructions, pamphlets, or other written materials from your doctor or pharmacy?: 3 - Sometimes (Due to vision being 20/200)  Diabetic? Yes  Nutrition Risk Assessment:  Has the patient had any N/V/D within the last 2 months?  No  Does the patient have any non-healing wounds?  No  Has the patient had any unintentional weight loss or weight gain?  No    Diabetes:  Is the patient diabetic?  Yes  If diabetic, was a CBG obtained today?  No  Did the patient bring in their glucometer from home?  No  How often do you monitor your CBG's? Once a day in the morning.   Financial Strains and Diabetes Management:  Are you having any financial strains with the device, your supplies or your medication? No .  Does the patient want to be seen by Chronic Care Management for management of their diabetes?  No  Would the patient like to be referred to a Nutritionist or for Diabetic Management?  No   Diabetic Exams:  Diabetic Eye Exam: Completed 12/01/20 Diabetic Foot Exam: Completed 08/09/20   Interpreter Needed?: No  Information entered by :: [facility], LPN   Activities of Daily Living In your present state of health, do you have any difficulty performing the following activities: 01/25/2021 11/23/2020  Hearing? N N  Vision? Y Y  Comment Due to vision being 20/200. -  Difficulty concentrating or making decisions? N N  Walking or climbing stairs? N Y  Dressing or bathing? N Y  Doing errands, shopping? Y N  Comment Does not drive. -  Preparing Food and eating ? N -  Using the Toilet? N -  In the past six months, have you accidently leaked urine? N -  Do you have problems with loss of bowel control? N -  Managing your Medications? Y -  Comment Daughter assists with medications. -  Managing your Finances? Y -  Comment Daughter manages finances. -  Housekeeping or managing your Housekeeping? N -  Some recent data might be hidden    Patient Care Team: Virginia Crews, MD as PCP - General (Family Medicine) Isaias Cowman, MD as Consulting Physician (Cardiology) Eulogio Bear, MD as Consulting Physician (Ophthalmology) Clabe Seal, PA-C  Indicate any recent Medical Services you may have received from other than [facility] providers in the past year (date may be approximate).     Assessment:   This is a routine wellness  examination for [name].  Hearing/Vision screen No exam data present  Dietary issues and exercise activities discussed: Current Exercise Habits: The patient does not participate in regular exercise at present, Exercise limited by: None identified  Goals      Patient Stated   .  Weight (lb) < 185 lb (83.9 kg) (pt-stated)      Recommend to cut back on sugar intake and sweets in diet to help aid with weight loss.  Other   .  Exercise 3x per week (30 min per time)      Recommend to exercise for 3 days a week for at least 30 minutes at a time.       Depression Screen PHQ 2/9 Scores 01/25/2021 11/23/2020 01/20/2020 05/05/2019 09/21/2017 09/06/2015 09/06/2015  PHQ - 2 Score 0 0 0 0 0 0 0  PHQ- 9 Score - 6 - 0 2 - -    Fall Risk Fall Risk  01/25/2021 11/23/2020 01/20/2020 03/10/2019 09/21/2017  Falls in the past year? 0 0 0 1 No  Comment - - - - -  Number falls in past yr: 0 0 0 1 -  Injury with Fall? 0 0 0 0 -  Comment - - - - -  Follow up - - - - -    FALL RISK PREVENTION PERTAINING TO THE HOME:  Any stairs in or around the home? Yes  If so, are there any without handrails? No  Home free of loose throw rugs in walkways, pet beds, electrical cords, etc? Yes  Adequate lighting in your home to reduce risk of falls? Yes   ASSISTIVE DEVICES UTILIZED TO PREVENT FALLS:  Life alert? No  Use of a cane, walker or w/c? No  Grab bars in the bathroom? No  Shower chair or bench in shower? No  Elevated toilet seat or a handicapped toilet? No    Cognitive Function: Normal cognitive status assessed by observation by this Nurse Health Advisor. No abnormalities found.      6CIT Screen 01/20/2020  What Year? 0 points  What month? 0 points  What time? 0 points  Count back from 20 2 points  Months in reverse 2 points  Repeat phrase 6 points  Total Score 10    Immunizations Immunization History  Administered Date(s) Administered  . Fluad Quad(high Dose 65+) 08/04/2019, 08/09/2020  .  Influenza, High Dose Seasonal PF 09/06/2015, 09/08/2016, 09/21/2017, 06/26/2018  . Influenza-Unspecified 06/23/2014  . PFIZER(Purple Top)SARS-COV-2 Vaccination 12/12/2019, 01/02/2020, 08/19/2020  . Pneumococcal Conjugate-13 06/04/2015  . Pneumococcal Polysaccharide-23 09/21/2017  . Tdap 09/27/2012  . Zoster 09/06/2015  . Zoster Recombinat (Shingrix) 08/09/2020    TDAP status: Up to date  Flu Vaccine status: Up to date  Pneumococcal vaccine status: Up to date  Covid-19 vaccine status: Completed vaccines  Qualifies for Shingles Vaccine? Yes   Zostavax completed Yes   Shingrix Completed?: No.    Education has been provided regarding the importance of this vaccine. Patient has been advised to call insurance company to determine out of pocket expense if they have not yet received this vaccine. Advised may also receive vaccine at local pharmacy or Health Dept. Verbalized acceptance and understanding.  Screening Tests Health Maintenance  Topic Date Due  . Fecal DNA (Cologuard)  10/29/2020  . INFLUENZA VACCINE  05/23/2021  . HEMOGLOBIN A1C  05/23/2021  . FOOT EXAM  08/09/2021  . OPHTHALMOLOGY EXAM  12/01/2021  . TETANUS/TDAP  09/27/2022  . COVID-19 Vaccine  Completed  . Hepatitis C Screening  Completed  . PNA vac Low Risk Adult  Completed  . HPV VACCINES  Aged Out    Health Maintenance  Health Maintenance Due  Topic Date Due  . Fecal DNA (Cologuard)  10/29/2020    Colorectal cancer screening: No longer required.   Lung Cancer Screening: (Low Dose CT Chest recommended if Age 69-80 years, 30 pack-year currently smoking OR have quit w/in 15years.) does not qualify.   Additional Screening:  Hepatitis  C Screening: Up to date  Vision Screening: Recommended annual ophthalmology exams for early detection of glaucoma and other disorders of the eye. Is the patient up to date with their annual eye exam?  Yes  Who is the provider or what is the name of the office in which the  patient attends annual eye exams? Dr Edison Pace @ [facility] If pt is not established with a provider, would they like to be referred to a provider to establish care? No .   Dental Screening: Recommended annual dental exams for proper oral hygiene  Community Resource Referral / Chronic Care Management: CRR required this visit?  No   CCM required this visit?  No      Plan:     I have personally reviewed and noted the following in the patient's chart:   . Medical and social history . Use of alcohol, tobacco or illicit drugs  . Current medications and supplements . Functional ability and status . Nutritional status . Physical activity . Advanced directives . List of other physicians . Hospitalizations, surgeries, and ER visits in previous 12 months . Vitals . Screenings to include cognitive, depression, and falls . Referrals and appointments  In addition, I have reviewed and discussed with patient certain preventive protocols, quality metrics, and best practice recommendations. A written personalized care plan for preventive services as well as general preventive health recommendations were provided to patient.     Kaytee Taliercio [place], [place]   9/0/3009   Nurse Notes: Cologuard ordered today.

## 2021-01-25 ENCOUNTER — Other Ambulatory Visit: Payer: Self-pay

## 2021-01-25 ENCOUNTER — Ambulatory Visit (INDEPENDENT_AMBULATORY_CARE_PROVIDER_SITE_OTHER): Payer: PPO

## 2021-01-25 DIAGNOSIS — Z0000 Encounter for general adult medical examination without abnormal findings: Secondary | ICD-10-CM

## 2021-01-25 DIAGNOSIS — Z1211 Encounter for screening for malignant neoplasm of colon: Secondary | ICD-10-CM

## 2021-01-25 NOTE — Patient Instructions (Signed)
[name] , Thank you for taking time to come for your Medicare Wellness Visit. I appreciate your ongoing commitment to your health goals. Please review the following plan we discussed and let me know if I can assist you in the future.   Screening recommendations/referrals: Colonoscopy: Cologuard due, ordered today.  Recommended yearly ophthalmology/optometry visit for glaucoma screening and checkup Recommended yearly dental visit for hygiene and checkup  Vaccinations: Influenza vaccine: Done 08/09/20 Pneumococcal vaccine: Completed series Tdap vaccine: Up to date, due 09/2022 Shingles vaccine: Shingrix discussed. Please contact your pharmacy for coverage information.     Advanced directives: Currently on file.  Conditions/risks identified: Recommend to exercise for 3 days a week for at least 30 minutes at a time. Also recommend to continue to cut back on sweets and sugar intake to help with weight loss.   Next appointment: 02/25/21 @ 8:40 AM with Dr Beryle Flock   Preventive Care 65 Years and Older, Male Preventive care refers to lifestyle choices and visits with your health care provider that can promote health and wellness. What does preventive care include?  A yearly physical exam. This is also called an annual well check.  Dental exams once or twice a year.  Routine eye exams. Ask your health care provider how often you should have your eyes checked.  Personal lifestyle choices, including:  Daily care of your teeth and gums.  Regular physical activity.  Eating a healthy diet.  Avoiding tobacco and drug use.  Limiting alcohol use.  Practicing safe sex.  Taking low doses of aspirin every day.  Taking vitamin and mineral supplements as recommended by your health care provider. What happens during an annual well check? The services and screenings done by your health care provider during your annual well check will depend on your age, overall health, lifestyle risk  factors, and family history of disease. Counseling  Your health care provider may ask you questions about your:  Alcohol use.  Tobacco use.  Drug use.  Emotional well-being.  Home and relationship well-being.  Sexual activity.  Eating habits.  History of falls.  Memory and ability to understand (cognition).  Work and work Astronomer. Screening  You may have the following tests or measurements:  Height, weight, and BMI.  Blood pressure.  Lipid and cholesterol levels. These may be checked every 5 years, or more frequently if you are over 23 years old.  Skin check.  Lung cancer screening. You may have this screening every year starting at age 65 if you have a 30-pack-year history of smoking and currently smoke or have quit within the past 15 years.  Fecal occult blood test (FOBT) of the stool. You may have this test every year starting at age 53.  Flexible sigmoidoscopy or colonoscopy. You may have a sigmoidoscopy every 5 years or a colonoscopy every 10 years starting at age 1.  Prostate cancer screening. Recommendations will vary depending on your family history and other risks.  Hepatitis C blood test.  Hepatitis B blood test.  Sexually transmitted disease (STD) testing.  Diabetes screening. This is done by checking your blood sugar (glucose) after you have not eaten for a while (fasting). You may have this done every 1-3 years.  Abdominal aortic aneurysm (AAA) screening. You may need this if you are a current or former smoker.  Osteoporosis. You may be screened starting at age 50 if you are at high risk. Talk with your health care provider about your test results, treatment options, and if necessary,  the need for more tests. Vaccines  Your health care provider may recommend certain vaccines, such as:  Influenza vaccine. This is recommended every year.  Tetanus, diphtheria, and acellular pertussis (Tdap, Td) vaccine. You may need a Td booster every 10  years.  Zoster vaccine. You may need this after age 66.  Pneumococcal 13-valent conjugate (PCV13) vaccine. One dose is recommended after age 8.  Pneumococcal polysaccharide (PPSV23) vaccine. One dose is recommended after age 75. Talk to your health care provider about which screenings and vaccines you need and how often you need them. This information is not intended to replace advice given to you by your health care provider. Make sure you discuss any questions you have with your health care provider. Document Released: 11/05/2015 Document Revised: 06/28/2016 Document Reviewed: 08/10/2015 Elsevier Interactive Patient Education  2017 [employer] Prevention in the Home Falls can cause injuries. They can happen to people of all ages. There are many things you can do to make your home safe and to help prevent falls. What can I do on the outside of my home?  Regularly fix the edges of walkways and driveways and fix any cracks.  Remove anything that might make you trip as you walk through a door, such as a raised step or threshold.  Trim any bushes or trees on the path to your home.  Use bright outdoor lighting.  Clear any walking paths of anything that might make someone trip, such as rocks or tools.  Regularly check to see if handrails are loose or broken. Make sure that both sides of any steps have handrails.  Any raised decks and porches should have guardrails on the edges.  Have any leaves, snow, or ice cleared regularly.  Use sand or salt on walking paths during winter.  Clean up any spills in your garage right away. This includes oil or grease spills. What can I do in the bathroom?  Use night lights.  Install grab bars by the toilet and in the tub and shower. Do not use towel bars as grab bars.  Use non-skid mats or decals in the tub or shower.  If you need to sit down in the shower, use a plastic, non-slip stool.  Keep the floor dry. Clean up any water that  spills on the floor as soon as it happens.  Remove soap buildup in the tub or shower regularly.  Attach bath mats securely with double-sided non-slip rug tape.  Do not have throw rugs and other things on the floor that can make you trip. What can I do in the bedroom?  Use night lights.  Make sure that you have a light by your bed that is easy to reach.  Do not use any sheets or blankets that are too big for your bed. They should not hang down onto the floor.  Have a firm chair that has side arms. You can use this for support while you get dressed.  Do not have throw rugs and other things on the floor that can make you trip. What can I do in the kitchen?  Clean up any spills right away.  Avoid walking on wet floors.  Keep items that you use a lot in easy-to-reach places.  If you need to reach something above you, use a strong step stool that has a grab bar.  Keep electrical cords out of the way.  Do not use floor polish or wax that makes floors slippery. If you must use  wax, use non-skid floor wax.  Do not have throw rugs and other things on the floor that can make you trip. What can I do with my stairs?  Do not leave any items on the stairs.  Make sure that there are handrails on both sides of the stairs and use them. Fix handrails that are broken or loose. Make sure that handrails are as long as the stairways.  Check any carpeting to make sure that it is firmly attached to the stairs. Fix any carpet that is loose or worn.  Avoid having throw rugs at the top or bottom of the stairs. If you do have throw rugs, attach them to the floor with carpet tape.  Make sure that you have a light switch at the top of the stairs and the bottom of the stairs. If you do not have them, ask someone to add them for you. What else can I do to help prevent falls?  Wear shoes that:  Do not have high heels.  Have rubber bottoms.  Are comfortable and fit you well.  Are closed at the  toe. Do not wear sandals.  If you use a stepladder:  Make sure that it is fully opened. Do not climb a closed stepladder.  Make sure that both sides of the stepladder are locked into place.  Ask someone to hold it for you, if possible.  Clearly mark and make sure that you can see:  Any grab bars or handrails.  First and last steps.  Where the edge of each step is.  Use tools that help you move around (mobility aids) if they are needed. These include:  Canes.  Walkers.  Scooters.  Crutches.  Turn on the lights when you go into a dark area. Replace any light bulbs as soon as they burn out.  Set up your furniture so you have a clear path. Avoid moving your furniture around.  If any of your floors are uneven, fix them.  If there are any pets around you, be aware of where they are.  Review your medicines with your doctor. Some medicines can make you feel dizzy. This can increase your chance of falling. Ask your doctor what other things that you can do to help prevent falls. This information is not intended to replace advice given to you by your health care provider. Make sure you discuss any questions you have with your health care provider. Document Released: 08/05/2009 Document Revised: 03/16/2016 Document Reviewed: 11/13/2014 Elsevier Interactive Patient Education  2017 [employer].

## 2021-02-08 ENCOUNTER — Encounter: Payer: Self-pay | Admitting: Family Medicine

## 2021-02-21 ENCOUNTER — Telehealth: Payer: Self-pay | Admitting: Family Medicine

## 2021-02-21 NOTE — Telephone Encounter (Signed)
Requested medication (s) are due for refill today: yes  Requested medication (s) are on the active medication list: yes  Last refill:  01/24/2021  Future visit scheduled: yes  Notes to clinic: this refilled cannot be delegated    Requested Prescriptions  Pending Prescriptions Disp Refills   HYDROcodone-acetaminophen (NORCO) 5-325 MG tablet 60 tablet 0    Sig: Take 1-2 tablets by mouth every 6 (six) hours as needed for moderate pain.      There is no refill protocol information for this order

## 2021-02-21 NOTE — Telephone Encounter (Signed)
Medication Refill - Medication: HYDROcodone-acetaminophen (NORCO) 5-325 MG tablet     Preferred Pharmacy (with phone number or street name):  [id number] [place], [place] - [address] AT [facility] OF [address] [facility] Phone:  [phone]  Fax:  [phone]       Agent: Please be advised that RX refills may take up to 3 business days. We ask that you follow-up with your pharmacy.

## 2021-02-22 DIAGNOSIS — E119 Type 2 diabetes mellitus without complications: Secondary | ICD-10-CM | POA: Diagnosis not present

## 2021-02-22 DIAGNOSIS — Z9889 Other specified postprocedural states: Secondary | ICD-10-CM | POA: Diagnosis not present

## 2021-02-22 DIAGNOSIS — E785 Hyperlipidemia, unspecified: Secondary | ICD-10-CM | POA: Diagnosis not present

## 2021-02-22 DIAGNOSIS — I2510 Atherosclerotic heart disease of native coronary artery without angina pectoris: Secondary | ICD-10-CM | POA: Diagnosis not present

## 2021-02-22 DIAGNOSIS — I10 Essential (primary) hypertension: Secondary | ICD-10-CM | POA: Diagnosis not present

## 2021-02-22 DIAGNOSIS — Z955 Presence of coronary angioplasty implant and graft: Secondary | ICD-10-CM | POA: Diagnosis not present

## 2021-02-22 MED ORDER — HYDROCODONE-ACETAMINOPHEN 5-325 MG PO TABS
1.0000 | ORAL_TABLET | Freq: Four times a day (QID) | ORAL | 0 refills | Status: DC | PRN
Start: 2021-02-22 — End: 2021-03-22

## 2021-02-25 ENCOUNTER — Ambulatory Visit (INDEPENDENT_AMBULATORY_CARE_PROVIDER_SITE_OTHER): Payer: PPO | Admitting: Family Medicine

## 2021-02-25 ENCOUNTER — Encounter: Payer: Self-pay | Admitting: Family Medicine

## 2021-02-25 ENCOUNTER — Other Ambulatory Visit: Payer: Self-pay

## 2021-02-25 VITALS — BP 124/84 | HR 74 | Temp 98.3°F | Ht 72.0 in | Wt 197.3 lb

## 2021-02-25 DIAGNOSIS — Z79899 Other long term (current) drug therapy: Secondary | ICD-10-CM

## 2021-02-25 DIAGNOSIS — E1159 Type 2 diabetes mellitus with other circulatory complications: Secondary | ICD-10-CM | POA: Diagnosis not present

## 2021-02-25 DIAGNOSIS — I739 Peripheral vascular disease, unspecified: Secondary | ICD-10-CM | POA: Diagnosis not present

## 2021-02-25 DIAGNOSIS — E1169 Type 2 diabetes mellitus with other specified complication: Secondary | ICD-10-CM | POA: Diagnosis not present

## 2021-02-25 DIAGNOSIS — E785 Hyperlipidemia, unspecified: Secondary | ICD-10-CM

## 2021-02-25 DIAGNOSIS — I152 Hypertension secondary to endocrine disorders: Secondary | ICD-10-CM | POA: Diagnosis not present

## 2021-02-25 DIAGNOSIS — E663 Overweight: Secondary | ICD-10-CM | POA: Diagnosis not present

## 2021-02-25 DIAGNOSIS — Z0000 Encounter for general adult medical examination without abnormal findings: Secondary | ICD-10-CM

## 2021-02-25 NOTE — Assessment & Plan Note (Signed)
New problem, but ongoing for about a year Symptoms seem consistent with claudication This is concerning in the setting of his history of CAD for possible PAD Continue statin and Plavix Referral to vascular surgery for further evaluation and management

## 2021-02-25 NOTE — Assessment & Plan Note (Signed)
Discussed importance of healthy weight management Discussed diet and exercise  

## 2021-02-25 NOTE — Patient Instructions (Signed)
Preventive Care 65 Years and Older, Male Preventive care refers to lifestyle choices and visits with your health care provider that can promote health and wellness. This includes:  A yearly physical exam. This is also called an annual wellness visit.  Regular dental and eye exams.  Immunizations.  Screening for certain conditions.  Healthy lifestyle choices, such as: ? Eating a healthy diet. ? Getting regular exercise. ? Not using drugs or products that contain nicotine and tobacco. ? Limiting alcohol use. What can I expect for my preventive care visit? Physical exam Your health care provider will check your:  Height and weight. These may be used to calculate your BMI (body mass index). BMI is a measurement that tells if you are at a healthy weight.  Heart rate and blood pressure.  Body temperature.  Skin for abnormal spots. Counseling Your health care provider may ask you questions about your:  Past medical problems.  Family's medical history.  Alcohol, tobacco, and drug use.  Emotional well-being.  Home life and relationship well-being.  Sexual activity.  Diet, exercise, and sleep habits.  History of falls.  Memory and ability to understand (cognition).  Work and work environment.  Access to firearms. What immunizations do I need? Vaccines are usually given at various ages, according to a schedule. Your health care provider will recommend vaccines for you based on your age, medical history, and lifestyle or other factors, such as travel or where you work.   What tests do I need? Blood tests  Lipid and cholesterol levels. These may be checked every 5 years, or more often depending on your overall health.  Hepatitis C test.  Hepatitis B test. Screening  Lung cancer screening. You may have this screening every year starting at age 55 if you have a 30-pack-year history of smoking and currently smoke or have quit within the past 15 years.  Colorectal  cancer screening. ? All adults should have this screening starting at age 50 and continuing until age 75. ? Your health care provider may recommend screening at age 45 if you are at increased risk. ? You will have tests every 1-10 years, depending on your results and the type of screening test.  Prostate cancer screening. Recommendations will vary depending on your family history and other risks.  Genital exam to check for testicular cancer or hernias.  Diabetes screening. ? This is done by checking your blood sugar (glucose) after you have not eaten for a while (fasting). ? You may have this done every 1-3 years.  Abdominal aortic aneurysm (AAA) screening. You may need this if you are a current or former smoker.  STD (sexually transmitted disease) testing, if you are at risk. Follow these instructions at home: Eating and drinking  Eat a diet that includes fresh fruits and vegetables, whole grains, lean protein, and low-fat dairy products. Limit your intake of foods with high amounts of sugar, saturated fats, and salt.  Take vitamin and mineral supplements as recommended by your health care provider.  Do not drink alcohol if your health care provider tells you not to drink.  If you drink alcohol: ? Limit how much you have to 0-2 drinks a day. ? Be aware of how much alcohol is in your drink. In the [place] one drink equals one 12 oz bottle of beer (355 mL), one 5 oz glass of wine (148 mL), or one 1 oz glass of hard liquor (44 mL).   Lifestyle  Take daily care of your teeth   and gums. Brush your teeth every morning and night with fluoride toothpaste. Floss one time each day.  Stay active. Exercise for at least 30 minutes 5 or more days each week.  Do not use any products that contain nicotine or tobacco, such as cigarettes, e-cigarettes, and chewing tobacco. If you need help quitting, ask your health care provider.  Do not use drugs.  If you are sexually active, practice safe sex.  Use a condom or other form of protection to prevent STIs (sexually transmitted infections).  Talk with your health care provider about taking a low-dose aspirin or statin.  Find healthy ways to cope with stress, such as: ? Meditation, yoga, or listening to music. ? Journaling. ? Talking to a trusted person. ? Spending time with friends and family. Safety  Always wear your seat belt while driving or riding in a vehicle.  Do not drive: ? If you have been drinking alcohol. Do not ride with someone who has been drinking. ? When you are tired or distracted. ? While texting.  Wear a helmet and other protective equipment during sports activities.  If you have firearms in your house, make sure you follow all gun safety procedures. What's next?  Visit your health care provider once a year for an annual wellness visit.  Ask your health care provider how often you should have your eyes and teeth checked.  Stay up to date on all vaccines. This information is not intended to replace advice given to you by your health care provider. Make sure you discuss any questions you have with your health care provider. Document Revised: 07/08/2019 Document Reviewed: 10/03/2018 Elsevier Patient Education  2021 [employer]vier Inc.  

## 2021-02-25 NOTE — Assessment & Plan Note (Signed)
Previously with elevated A1c/hyperglycemia and Associated HTN, HLD, CAD Up-to-date on foot exam, eye exam On ACE inhibitor and statin Continue Jardiance, metformin, Trulicity-all at max dose Patient has been working on diet and exercise and is hesitant to add insulin Recheck A1c

## 2021-02-25 NOTE — Assessment & Plan Note (Signed)
Well controlled Continue current medications Recheck metabolic panel F/u in 6 months  

## 2021-02-25 NOTE — Assessment & Plan Note (Signed)
Continue fenofibrate, zetia, statin Goal LDL <70 Recheck CMP and FLP

## 2021-02-25 NOTE — Progress Notes (Signed)
Complete physical exam   Patient: [name]   DOB: [birth year]   70 y.o. Male  MRN: [id number] Visit Date: 02/25/2021  Today's healthcare provider: Lavon Paganini, MD   Chief Complaint  Patient presents with  . Annual Exam   Subjective    [name] is a 70 y.o. male who presents today for a complete physical exam.  He reports consuming a general diet. Home exercise routine includes walking and yard work. He generally feels well. He reports sleeping poorly. He does not have additional problems to discuss today.  HPI  AWE was on 01/25/2021 w/ [place]  Leg Pain He states that he is having right calf pain with exertion. The pain has been intermittent for the last year.   Past Medical History:  Diagnosis Date  . Arthritis    osteoarthriis of back  . Coronary artery disease   . Depression   . Diabetes mellitus without complication ([place])   . GERD (gastroesophageal reflux disease)   . Hyperlipidemia   . Hypertension    Past Surgical History:  Procedure Laterality Date  . CATARACT EXTRACTION W/PHACO Right 03/01/2017   Procedure: CATARACT EXTRACTION PHACO AND INTRAOCULAR LENS PLACEMENT (IOC);  Surgeon: Eulogio Bear, MD;  Location: [facility];  Service: Ophthalmology;  Laterality: Right;  [place] 22.6 AP% 6.1 CDE 1.37 Fluid pack lot # [id number]  . CATARACT EXTRACTION W/PHACO Left 05/01/2017   Procedure: CATARACT EXTRACTION PHACO AND INTRAOCULAR LENS PLACEMENT (IOC)  Left Diabetic;  Surgeon: Eulogio Bear, MD;  Location: [place];  Service: Ophthalmology;  Laterality: Left;  Block Diabetic  . CORONARY ANGIOPLASTY     STENT 2009  . POSTERIOR CERVICAL LAMINECTOMY N/A 02/25/2018   Procedure: POSTERIOR CERVICAL LAMINECTOMY-CERVICAL 3;  Surgeon: Kathlene November, MD;  Location: [facility];  Service: Neurosurgery;  Laterality: N/A;  . Stint  2007   Social History   Socioeconomic History  . Marital status: Divorced    Spouse name: Not on file  . Number of  children: 2  . Years of education: Not on file  . Highest education level: 9th grade  Occupational History  . Occupation: retired  Tobacco Use  . Smoking status: Former Smoker    Types: Cigars    Quit date: 05/06/2020    Years since quitting: 0.8  . Smokeless tobacco: Never Used  . Tobacco comment: smokes cigars-about 10-15 cigars a week, quit cigarettes 20+ years ago  Vaping Use  . Vaping Use: Never used  Substance and Sexual Activity  . Alcohol use: No  . Drug use: No  . Sexual activity: Not on file  Other Topics Concern  . Not on file  Social History Narrative  . Not on file   Social Determinants of Health   Financial Resource Strain: Low Risk   . Difficulty of Paying Living Expenses: Not hard at all  Food Insecurity: No Food Insecurity  . Worried About Charity fundraiser in the Last Year: Never true  . Ran Out of Food in the Last Year: Never true  Transportation Needs: No Transportation Needs  . Lack of Transportation (Medical): No  . Lack of Transportation (Non-Medical): No  Physical Activity: Inactive  . Days of Exercise per Week: 0 days  . Minutes of Exercise per Session: 0 min  Stress: No Stress Concern Present  . Feeling of Stress : Not at all  Social Connections: Moderately Isolated  . Frequency of Communication with Friends and Family: More than  three times a week  . Frequency of Social Gatherings with Friends and Family: Three times a week  . Attends Religious Services: Never  . Active Member of Clubs or Organizations: No  . Attends Archivist Meetings: Never  . Marital Status: Married  Human resources officer Violence: Not At Risk  . Fear of Current or Ex-Partner: No  . Emotionally Abused: No  . Physically Abused: No  . Sexually Abused: No   Family Status  Relation Name Status  . Mother  Alive  . Father  Deceased       Heart Attack  . Sister 3 Alive  . Brother 2 Alive   Family History  Problem Relation Age of Onset  . Diabetes Mother         mellitus type2   Allergies  Allergen Reactions  . Amoxicillin     diarrhea and headache Has patient had a PCN reaction causing immediate rash, facial/tongue/throat swelling, SOB or lightheadedness with hypotension: No Has patient had a PCN reaction causing severe rash involving mucus membranes or skin necrosis: No Has patient had a PCN reaction that required hospitalization: No Has patient had a PCN reaction occurring within the last 10 years: Unknown If all of the above answers are "NO", then may proceed with Cephalosporin use.   Marland Kitchen Fexofenadine     Unknown reaction     Patient Care Team: Virginia Crews, MD as PCP - General (Family Medicine) Isaias Cowman, MD as Consulting Physician (Cardiology) Eulogio Bear, MD as Consulting Physician (Ophthalmology) Clabe Seal, PA-C   Medications: Outpatient Medications Prior to Visit  Medication Sig  . amLODipine (NORVASC) 5 MG tablet Take 1 tablet (5 mg total) by mouth daily.  Marland Kitchen aspirin 81 MG tablet Take 81 mg by mouth daily.   Marland Kitchen atorvastatin (LIPITOR) 80 MG tablet TAKE ONE (1) TABLET EACH DAY  . blood glucose meter kit and supplies KIT Dispense based on patient and insurance preference. Check sugars once daily  . Calcium Citrate-Vitamin D (CALCIUM + D PO) Take 1 tablet by mouth daily.  . clopidogrel (PLAVIX) 75 MG tablet Take 1 tablet (75 mg total) by mouth daily.  . Dulaglutide (TRULICITY) 4.5 XK/5.5VZ SOPN Inject 4.5 mg as directed once a week.  . empagliflozin (JARDIANCE) 25 MG TABS tablet Take 1 tablet (25 mg total) by mouth daily.  Marland Kitchen ezetimibe (ZETIA) 10 MG tablet Take 1 tablet (10 mg total) by mouth daily.  . fenofibrate (TRICOR) 145 MG tablet Take 1 tablet (145 mg total) by mouth daily.  Marland Kitchen glucose blood (ONETOUCH VERIO) test strip TEST BLOOD GLUCOSE EVERY DAY  . hydrochlorothiazide (HYDRODIURIL) 25 MG tablet Take 1 tablet (25 mg total) by mouth daily.  Marland Kitchen HYDROcodone-acetaminophen (NORCO) 5-325 MG tablet Take 1-2  tablets by mouth every 6 (six) hours as needed for moderate pain.  Marland Kitchen lisinopril (ZESTRIL) 20 MG tablet Take 1 tablet (20 mg total) by mouth daily.  . metFORMIN (GLUCOPHAGE) 1000 MG tablet TAKE ONE (1) TABLET BY MOUTH TWO (2) TIMES DAILY WITH FOOD  . metoprolol tartrate (LOPRESSOR) 50 MG tablet Take 1 tablet (50 mg total) by mouth 2 (two) times daily.  . pantoprazole (PROTONIX) 40 MG tablet TAKE ONE (1) TABLET BY MOUTH EVERY DAY AS NEEDED FOR HEARTBURN OR ACID REFLUX  . Potassium Gluconate 2.5 MEQ TABS Take by mouth daily.    No facility-administered medications prior to visit.    Review of Systems  Constitutional: Negative.  Negative for chills, fatigue and fever.  HENT: Negative.  Negative for congestion, ear pain, rhinorrhea, sinus pain and sore throat.   Eyes: Negative.   Respiratory: Negative.  Negative for cough, shortness of breath and wheezing.   Cardiovascular: Negative.  Negative for chest pain and leg swelling.  Gastrointestinal: Negative.  Negative for abdominal pain, blood in stool, diarrhea, nausea and vomiting.  Endocrine: Negative.   Genitourinary: Negative.  Negative for dysuria, flank pain, frequency and urgency.  Musculoskeletal: Negative.   Skin: Negative.   Allergic/Immunologic: Negative.   Neurological: Negative.  Negative for dizziness and headaches.  Hematological: Negative.   Psychiatric/Behavioral: Negative.       Objective    BP 124/84 (BP Location: Right Arm, Patient Position: Sitting, Cuff Size: Normal)   Pulse 74   Temp 98.3 F (36.8 C) (Oral)   Ht 6' (1.829 m)   Wt 197 lb 4.8 oz (89.5 kg)   SpO2 100%   BMI 26.76 kg/m    Physical Exam Vitals reviewed.  Constitutional:      General: He is not in acute distress.    Appearance: Normal appearance. He is well-developed. He is not diaphoretic.  HENT:     Head: Normocephalic and atraumatic.  Eyes:     General: No scleral icterus.    Conjunctiva/sclera: Conjunctivae normal.  Neck:     Thyroid:  No thyromegaly.  Cardiovascular:     Rate and Rhythm: Normal rate and regular rhythm.     Heart sounds: Normal heart sounds. No murmur heard.   Pulmonary:     Effort: Pulmonary effort is normal. No respiratory distress.     Breath sounds: Normal breath sounds. No wheezing or rales.  Abdominal:     General: There is no distension.     Palpations: Abdomen is soft.     Tenderness: There is no abdominal tenderness.  Musculoskeletal:        General: No tenderness or deformity.     Cervical back: Neck supple.     Right lower leg: No edema.     Left lower leg: No edema.  Lymphadenopathy:     Cervical: No cervical adenopathy.  Skin:    General: Skin is warm and dry.     Findings: No rash.  Neurological:     Mental Status: He is alert and oriented to person, place, and time. Mental status is at baseline.     Sensory: No sensory deficit.     Motor: No weakness.     Gait: Gait normal.  Psychiatric:        Mood and Affect: Mood normal.        Behavior: Behavior normal.        Thought Content: Thought content normal.       Last depression screening scores PHQ 2/9 Scores 02/25/2021 01/25/2021 11/23/2020  PHQ - 2 Score 0 0 0  PHQ- 9 Score 3 - 6   Last fall risk screening Fall Risk  02/25/2021  Falls in the past year? 1  Comment -  Number falls in past yr: 1  Injury with Fall? 0  Comment -  Risk for fall due to : History of fall(s)  Follow up Falls evaluation completed;Education provided;Falls prevention discussed   Last Audit-C alcohol use screening Alcohol Use Disorder Test (AUDIT) 02/25/2021  1. How often do you have a drink containing alcohol? 0  2. How many drinks containing alcohol do you have on a typical day when you are drinking? 0  3. How often do you have six or  more drinks on one occasion? 0  AUDIT-C Score 0  Alcohol Brief Interventions/Follow-up -   A score of 3 or more in women, and 4 or more in men indicates increased risk for alcohol abuse, EXCEPT if all of the points  are from question 1   No results found for any visits on 02/25/21.  Assessment & Plan    Routine Health Maintenance and Physical Exam  Exercise Activities and Dietary recommendations Goals    .  Exercise 3x per week (30 min per time)      Recommend to exercise for 3 days a week for at least 30 minutes at a time.     .  Weight (lb) < 185 lb (83.9 kg) (pt-stated)      Recommend to cut back on sugar intake and sweets in diet to help aid with weight loss.        Immunization History  Administered Date(s) Administered  . Fluad Quad(high Dose 65+) 08/04/2019, 08/09/2020  . Influenza, High Dose Seasonal PF 09/06/2015, 09/08/2016, 09/21/2017, 06/26/2018  . Influenza-Unspecified 06/23/2014  . PFIZER(Purple Top)SARS-COV-2 Vaccination 12/12/2019, 01/02/2020, 08/19/2020  . Pneumococcal Conjugate-13 06/04/2015  . Pneumococcal Polysaccharide-23 09/21/2017  . Tdap 09/27/2012  . Zoster 09/06/2015  . Zoster Recombinat (Shingrix) 08/09/2020    Health Maintenance  Topic Date Due  . Fecal DNA (Cologuard)  10/29/2020  . INFLUENZA VACCINE  05/23/2021  . HEMOGLOBIN A1C  05/23/2021  . FOOT EXAM  08/09/2021  . OPHTHALMOLOGY EXAM  12/01/2021  . TETANUS/TDAP  09/27/2022  . COVID-19 Vaccine  Completed  . Hepatitis C Screening  Completed  . PNA vac Low Risk Adult  Completed  . HPV VACCINES  Aged Out    Discussed health benefits of physical activity, and encouraged him to engage in regular exercise appropriate for his age and condition.  Problem List Items Addressed This Visit      Cardiovascular and Mediastinum   Hypertension associated with diabetes ([place])    Well controlled Continue current medications Recheck metabolic panel F/u in 6 months       Relevant Orders   Comprehensive metabolic panel     Endocrine   Hyperlipidemia associated with type 2 diabetes mellitus ([place])    Continue fenofibrate, zetia, statin Goal LDL <70 Recheck CMP and FLP      Relevant Orders   Lipid panel    Comprehensive metabolic panel   W2XH (type 2 diabetes mellitus) ([place])    Previously with elevated A1c/hyperglycemia and Associated HTN, HLD, CAD Up-to-date on foot exam, eye exam On ACE inhibitor and statin Continue Jardiance, metformin, Trulicity-all at max dose Patient has been working on diet and exercise and is hesitant to add insulin Recheck A1c      Relevant Orders   Hemoglobin A1c     Other   Overweight    Discussed importance of healthy weight management Discussed diet and exercise       Intermittent claudication ([place])    New problem, but ongoing for about a year Symptoms seem consistent with claudication This is concerning in the setting of his history of CAD for possible PAD Continue statin and Plavix Referral to vascular surgery for further evaluation and management      Relevant Orders   Ambulatory referral to Vascular Surgery    Other Visit Diagnoses    Encounter for annual physical exam    -  Primary   Relevant Orders   Lipid panel   Comprehensive metabolic panel   CBC with Differential/Platelet  Hemoglobin A1c   Long-term use of high-risk medication       Relevant Orders   CBC with Differential/Platelet       Return in about 3 months (around 05/28/2021) for chronic disease f/u.     Frederic Jericho [place],acting as a Education administrator for Lavon Paganini, MD.,have documented all relevant documentation on the behalf of Lavon Paganini, MD,as directed by  Lavon Paganini, MD while in the presence of Lavon Paganini, MD.  I, Lavon Paganini, MD, have reviewed all documentation for this visit. The documentation on 02/25/21 for the exam, diagnosis, procedures, and orders are all accurate and complete.   Coltan Spinello, Dionne Bucy, MD, MPH [facility]

## 2021-02-26 LAB — LIPID PANEL
Chol/HDL Ratio: 5.9 ratio — ABNORMAL HIGH (ref 0.0–5.0)
Cholesterol, Total: 147 mg/dL (ref 100–199)
HDL: 25 mg/dL — ABNORMAL LOW (ref >39)
LDL Chol Calc (NIH): 86 mg/dL (ref 0–99)
Triglycerides: 214 mg/dL — ABNORMAL HIGH (ref 0–149)
VLDL Cholesterol Cal: 36 mg/dL (ref 5–40)

## 2021-02-26 LAB — CBC WITH DIFFERENTIAL/PLATELET
Basophils Absolute: 0.1 10*3/uL (ref 0.0–0.2)
Basos: 1 %
EOS (ABSOLUTE): 0.5 10*3/uL — ABNORMAL HIGH (ref 0.0–0.4)
Eos: 7 %
Hematocrit: 45.5 % (ref 37.5–51.0)
Hemoglobin: 14.8 g/dL (ref 13.0–17.7)
Immature Grans (Abs): 0 10*3/uL (ref 0.0–0.1)
Immature Granulocytes: 0 %
Lymphocytes Absolute: 2.5 10*3/uL (ref 0.7–3.1)
Lymphs: 33 %
MCH: 28.9 pg (ref 26.6–33.0)
MCHC: 32.5 g/dL (ref 31.5–35.7)
MCV: 89 fL (ref 79–97)
Monocytes Absolute: 0.7 10*3/uL (ref 0.1–0.9)
Monocytes: 9 %
Neutrophils Absolute: 3.8 10*3/uL (ref 1.4–7.0)
Neutrophils: 50 %
Platelets: 274 10*3/uL (ref 150–450)
RBC: 5.12 x10E6/uL (ref 4.14–5.80)
RDW: 13.9 % (ref 11.6–15.4)
WBC: 7.6 10*3/uL (ref 3.4–10.8)

## 2021-02-26 LAB — COMPREHENSIVE METABOLIC PANEL
ALT: 28 IU/L (ref 0–44)
AST: 24 IU/L (ref 0–40)
Albumin/Globulin Ratio: 1.9 (ref 1.2–2.2)
Albumin: 5 g/dL — ABNORMAL HIGH (ref 3.8–4.8)
Alkaline Phosphatase: 46 IU/L (ref 44–121)
BUN/Creatinine Ratio: 18 (ref 10–24)
BUN: 23 mg/dL (ref 8–27)
Bilirubin Total: 0.5 mg/dL (ref 0.0–1.2)
CO2: 23 mmol/L (ref 20–29)
Calcium: 10.4 mg/dL — ABNORMAL HIGH (ref 8.6–10.2)
Chloride: 95 mmol/L — ABNORMAL LOW (ref 96–106)
Creatinine, Ser: 1.3 mg/dL — ABNORMAL HIGH (ref 0.76–1.27)
Globulin, Total: 2.6 g/dL (ref 1.5–4.5)
Glucose: 137 mg/dL — ABNORMAL HIGH (ref 65–99)
Potassium: 4.2 mmol/L (ref 3.5–5.2)
Sodium: 137 mmol/L (ref 134–144)
Total Protein: 7.6 g/dL (ref 6.0–8.5)
eGFR: 59 mL/min/{1.73_m2} — ABNORMAL LOW (ref >59)

## 2021-02-26 LAB — HEMOGLOBIN A1C
Est. average glucose Bld gHb Est-mCnc: 174 mg/dL
Hgb A1c MFr Bld: 7.7 % — ABNORMAL HIGH (ref 4.8–5.6)

## 2021-03-01 ENCOUNTER — Telehealth: Payer: Self-pay

## 2021-03-01 ENCOUNTER — Other Ambulatory Visit: Payer: Self-pay

## 2021-03-01 DIAGNOSIS — E1159 Type 2 diabetes mellitus with other circulatory complications: Secondary | ICD-10-CM

## 2021-03-01 DIAGNOSIS — I152 Hypertension secondary to endocrine disorders: Secondary | ICD-10-CM

## 2021-03-01 NOTE — Telephone Encounter (Signed)
Daughter given lab results and instructions. Verbalizes understanding. °

## 2021-03-22 ENCOUNTER — Other Ambulatory Visit: Payer: Self-pay | Admitting: Family Medicine

## 2021-03-22 NOTE — Telephone Encounter (Signed)
Requested medication (s) are due for refill today: Yes  Requested medication (s) are on the active medication list: Yes  Last refill:  02/22/21  Future visit scheduled: Yes  Notes to clinic:  Unable to refill per protocol, cannot delgate.      Requested Prescriptions  Pending Prescriptions Disp Refills   HYDROcodone-acetaminophen (NORCO) 5-325 MG tablet 60 tablet 0    Sig: Take 1-2 tablets by mouth every 6 (six) hours as needed for moderate pain.      Not Delegated - Analgesics:  Opioid Agonist Combinations Failed - 03/22/2021  4:01 PM      Failed - This refill cannot be delegated      Failed - Urine Drug Screen completed in last 360 days      Passed - Valid encounter within last 6 months    Recent Outpatient Visits           3 weeks ago Encounter for annual physical exam   [facility] [place], Marzella Schlein, MD   3 months ago Type 2 diabetes mellitus with hyperglycemia, without long-term current use of insulin [facility])   [facility] [place], Marzella Schlein, MD   7 months ago Type 2 diabetes mellitus with other specified complication, without long-term current use of insulin [facility])   [facility] [place], Marzella Schlein, MD   10 months ago Type 2 diabetes mellitus with other specified complication, without long-term current use of insulin [facility])   [facility] [place], [place], [place]   1 year ago Acute flank pain   [facility] [place], Marzella Schlein, MD       Future Appointments             In 2 months Bacigalupo, Marzella Schlein, MD [facility], [facility]

## 2021-03-22 NOTE — Telephone Encounter (Signed)
Medication Refill - Medication: HYDROcodone-acetaminophen (NORCO) 5-325 MG tablet     Preferred Pharmacy (with phone number or street name): [id number] - [place], [place] - [address] AT [facility] OF [address]  Agent: Please be advised that RX refills may take up to 3 business days. We ask that you follow-up with your pharmacy.

## 2021-03-23 MED ORDER — HYDROCODONE-ACETAMINOPHEN 5-325 MG PO TABS: 1.0000 | ORAL_TABLET | Freq: Four times a day (QID) | ORAL | 0 refills | Status: DC | PRN

## 2021-03-31 ENCOUNTER — Other Ambulatory Visit (INDEPENDENT_AMBULATORY_CARE_PROVIDER_SITE_OTHER): Payer: Self-pay | Admitting: Vascular Surgery

## 2021-03-31 DIAGNOSIS — I739 Peripheral vascular disease, unspecified: Secondary | ICD-10-CM

## 2021-04-04 ENCOUNTER — Encounter (INDEPENDENT_AMBULATORY_CARE_PROVIDER_SITE_OTHER): Payer: Self-pay

## 2021-04-04 ENCOUNTER — Encounter (INDEPENDENT_AMBULATORY_CARE_PROVIDER_SITE_OTHER): Payer: Self-pay | Admitting: Vascular Surgery

## 2021-04-15 DIAGNOSIS — E1159 Type 2 diabetes mellitus with other circulatory complications: Secondary | ICD-10-CM | POA: Diagnosis not present

## 2021-04-15 DIAGNOSIS — I152 Hypertension secondary to endocrine disorders: Secondary | ICD-10-CM | POA: Diagnosis not present

## 2021-04-15 DIAGNOSIS — Z1211 Encounter for screening for malignant neoplasm of colon: Secondary | ICD-10-CM | POA: Diagnosis not present

## 2021-04-15 LAB — COLOGUARD: Cologuard: NEGATIVE

## 2021-04-16 LAB — BASIC METABOLIC PANEL
BUN/Creatinine Ratio: 15 (ref 10–24)
BUN: 20 mg/dL (ref 8–27)
CO2: 19 mmol/L — ABNORMAL LOW (ref 20–29)
Calcium: 10.1 mg/dL (ref 8.6–10.2)
Chloride: 102 mmol/L (ref 96–106)
Creatinine, Ser: 1.31 mg/dL — ABNORMAL HIGH (ref 0.76–1.27)
Glucose: 133 mg/dL — ABNORMAL HIGH (ref 65–99)
Potassium: 4.1 mmol/L (ref 3.5–5.2)
Sodium: 140 mmol/L (ref 134–144)
eGFR: 59 mL/min/{1.73_m2} — ABNORMAL LOW (ref >59)

## 2021-04-23 LAB — COLOGUARD: COLOGUARD: NEGATIVE

## 2021-04-26 ENCOUNTER — Other Ambulatory Visit: Payer: Self-pay | Admitting: Family Medicine

## 2021-04-26 MED ORDER — HYDROCODONE-ACETAMINOPHEN 5-325 MG PO TABS: 1.0000 | ORAL_TABLET | Freq: Four times a day (QID) | ORAL | 0 refills | Status: DC | PRN

## 2021-04-26 NOTE — Telephone Encounter (Signed)
Medication Refill - Medication: HYDROcodone-acetaminophen (NORCO) 5-325 MG tablet  Has the patient contacted their pharmacy? yes (Agent: If no, request that the patient contact the pharmacy for the refill.) (Agent: If yes, when and what did the pharmacy advise?)contact pcp  Preferred Pharmacy (with phone number or street name):  [id number] [place], [place] - [address] AT [facility] OF [address] & [facility] Phone:  [phone]  Fax:  [phone]      Agent: Please be advised that RX refills may take up to 3 business days. We ask that you follow-up with your pharmacy.

## 2021-04-26 NOTE — Telephone Encounter (Signed)
Requested medication (s) are due for refill today: yes  Requested medication (s) are on the active medication list: yes  Last refill:  03/22/2021  Future visit scheduled: yes  Notes to clinic:  this refill cannot be delegated    Requested Prescriptions  Pending Prescriptions Disp Refills   HYDROcodone-acetaminophen (NORCO) 5-325 MG tablet 60 tablet 0    Sig: Take 1-2 tablets by mouth every 6 (six) hours as needed for moderate pain.      Not Delegated - Analgesics:  Opioid Agonist Combinations Failed - 04/26/2021 12:33 PM      Failed - This refill cannot be delegated      Failed - Urine Drug Screen completed in last 360 days      Passed - Valid encounter within last 6 months    Recent Outpatient Visits           2 months ago Encounter for annual physical exam   [facility] [place], Marzella Schlein, MD   5 months ago Type 2 diabetes mellitus with hyperglycemia, without long-term current use of insulin [facility])   [facility], Marzella Schlein, MD   8 months ago Type 2 diabetes mellitus with other specified complication, without long-term current use of insulin [facility])   [facility] [place], Marzella Schlein, MD   11 months ago Type 2 diabetes mellitus with other specified complication, without long-term current use of insulin [facility])   [facility] [place], [place], [place]   1 year ago Acute flank pain   [facility] [place], Marzella Schlein, MD       Future Appointments             In 1 month Bacigalupo, Marzella Schlein, MD [facility], [facility]

## 2021-05-02 ENCOUNTER — Encounter: Payer: Self-pay | Admitting: Family Medicine

## 2021-05-03 ENCOUNTER — Other Ambulatory Visit: Payer: Self-pay | Admitting: Family Medicine

## 2021-05-03 DIAGNOSIS — K219 Gastro-esophageal reflux disease without esophagitis: Secondary | ICD-10-CM

## 2021-05-04 ENCOUNTER — Ambulatory Visit (INDEPENDENT_AMBULATORY_CARE_PROVIDER_SITE_OTHER): Payer: PPO | Admitting: Nurse Practitioner

## 2021-05-04 ENCOUNTER — Other Ambulatory Visit: Payer: Self-pay

## 2021-05-04 ENCOUNTER — Ambulatory Visit (INDEPENDENT_AMBULATORY_CARE_PROVIDER_SITE_OTHER): Payer: PPO

## 2021-05-04 VITALS — BP 121/74 | HR 70 | Ht 72.0 in | Wt 199.0 lb

## 2021-05-04 DIAGNOSIS — I739 Peripheral vascular disease, unspecified: Secondary | ICD-10-CM

## 2021-05-04 DIAGNOSIS — E785 Hyperlipidemia, unspecified: Secondary | ICD-10-CM

## 2021-05-04 DIAGNOSIS — I152 Hypertension secondary to endocrine disorders: Secondary | ICD-10-CM | POA: Diagnosis not present

## 2021-05-04 DIAGNOSIS — E1159 Type 2 diabetes mellitus with other circulatory complications: Secondary | ICD-10-CM | POA: Diagnosis not present

## 2021-05-04 DIAGNOSIS — E1169 Type 2 diabetes mellitus with other specified complication: Secondary | ICD-10-CM

## 2021-05-08 ENCOUNTER — Encounter (INDEPENDENT_AMBULATORY_CARE_PROVIDER_SITE_OTHER): Payer: Self-pay | Admitting: Nurse Practitioner

## 2021-05-08 NOTE — Progress Notes (Signed)
Subjective:    Patient ID: [name], male    DOB: [birth year], 70 y.o.   MRN: [id number] Chief Complaint  Patient presents with   New Patient (Initial Visit)    NP Bacigalupo claudication . ABI     The patient is seen for evaluation of painful lower extremities and diminished pulses.  Patient notes that the pain is not always associated with activity.  It is a throbbing sensation that occurs occasionally.  Activity makes no difference in the pain.  The pain has been progressive over the past several years. The patient states the inability to walk is now having a profound negative impact on quality of life and daily activities.  The patient denies rest pain or dangling of an extremity off the side of the bed during the night for relief. No open wounds or sores at this time. No prior interventions or surgeries.  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   The patient denies changes in claudication symptoms or new rest pain symptoms.  No new ulcers or wounds of the foot.  The patient's blood pressure has been stable and relatively well controlled. The patient denies amaurosis fugax or recent TIA symptoms. There are no recent neurological changes noted. The patient denies history of DVT, PE or superficial thrombophlebitis. The patient denies recent episodes of angina or shortness of breath.  Today noninvasive studies show an ABI of 1.20 on the right and 1.18 on the left.  The patient has triphasic tibial artery waveforms bilaterally with strong toe waveforms bilaterally.   Review of Systems  Musculoskeletal:  Positive for back pain and myalgias.  All other systems reviewed and are negative.     Objective:   Physical Exam Vitals reviewed.  HENT:     Head: Normocephalic.  Cardiovascular:     Rate and Rhythm: Normal rate.     Pulses:          Dorsalis pedis pulses are 1+ on the right side and 1+ on the left side.       Posterior tibial  pulses are 1+ on the right side and 1+ on the left side.  Pulmonary:     Effort: Pulmonary effort is normal.  Skin:    General: Skin is warm and dry.  Neurological:     Mental Status: He is alert and oriented to person, place, and time.  Psychiatric:        Mood and Affect: Mood normal.        Behavior: Behavior normal.        Thought Content: Thought content normal.        Judgment: Judgment normal.    BP 121/74   Pulse 70   Ht 6' (1.829 m)   Wt 199 lb (90.3 kg)   BMI 26.99 kg/m   Past Medical History:  Diagnosis Date   Arthritis    osteoarthriis of back   Coronary artery disease    Depression    Diabetes mellitus without complication (HCC)    GERD (gastroesophageal reflux disease)    Hyperlipidemia    Hypertension     Social History   Socioeconomic History   Marital status: Divorced    Spouse name: Not on file   Number of children: 2   Years of education: Not on file   Highest education level: 9th grade  Occupational History   Occupation: retired  Tobacco Use   Smoking status: Former  Types: Cigars    Quit date: 05/06/2020    Years since quitting: 1.0   Smokeless tobacco: Never   Tobacco comments:    smokes cigars-about 10-15 cigars a week, quit cigarettes 20+ years ago  Vaping Use   Vaping Use: Never used  Substance and Sexual Activity   Alcohol use: No   Drug use: No   Sexual activity: Not on file  Other Topics Concern   Not on file  Social History Narrative   Not on file   Social Determinants of Health   Financial Resource Strain: Low Risk    Difficulty of Paying Living Expenses: Not hard at all  Food Insecurity: No Food Insecurity   Worried About Charity fundraiser in the Last Year: Never true   Ran Out of Food in the Last Year: Never true  Transportation Needs: No Transportation Needs   Lack of Transportation (Medical): No   Lack of Transportation (Non-Medical): No  Physical Activity: Inactive   Days of Exercise per Week: 0 days    Minutes of Exercise per Session: 0 min  Stress: No Stress Concern Present   Feeling of Stress : Not at all  Social Connections: Moderately Isolated   Frequency of Communication with Friends and Family: More than three times a week   Frequency of Social Gatherings with Friends and Family: Three times a week   Attends Religious Services: Never   Active Member of Clubs or Organizations: No   Attends Archivist Meetings: Never   Marital Status: Married  Human resources officer Violence: Not At Risk   Fear of Current or Ex-Partner: No   Emotionally Abused: No   Physically Abused: No   Sexually Abused: No    Past Surgical History:  Procedure Laterality Date   CATARACT EXTRACTION W/PHACO Right 03/01/2017   Procedure: CATARACT EXTRACTION PHACO AND INTRAOCULAR LENS PLACEMENT ([place]);  Surgeon: Eulogio Bear, MD;  Location: [facility];  Service: Ophthalmology;  Laterality: Right;  [place] 22.6 AP% 6.1 CDE 1.37 Fluid pack lot # [id number]   CATARACT EXTRACTION W/PHACO Left 05/01/2017   Procedure: CATARACT EXTRACTION PHACO AND INTRAOCULAR LENS PLACEMENT ([facility])  Left Diabetic;  Surgeon: Eulogio Bear, MD;  Location: [place];  Service: Ophthalmology;  Laterality: Left;  Block Diabetic   CORONARY ANGIOPLASTY     STENT 2009   POSTERIOR CERVICAL LAMINECTOMY N/A 02/25/2018   Procedure: POSTERIOR CERVICAL LAMINECTOMY-CERVICAL 3;  Surgeon: Kathlene November, MD;  Location: [facility];  Service: Neurosurgery;  Laterality: N/A;   Stint  2007    Family History  Problem Relation Age of Onset   Diabetes Mother        mellitus type2    Allergies  Allergen Reactions   Amoxicillin     diarrhea and headache Has patient had a PCN reaction causing immediate rash, facial/tongue/throat swelling, SOB or lightheadedness with hypotension: No Has patient had a PCN reaction causing severe rash involving mucus membranes or skin necrosis: No Has patient had a PCN reaction that required hospitalization:  No Has patient had a PCN reaction occurring within the last 10 years: Unknown If all of the above answers are "NO", then may proceed with Cephalosporin use.    Fexofenadine     Unknown reaction     CBC Latest Ref Rng & Units 02/25/2021 08/09/2020 02/26/2018  WBC 3.4 - 10.8 x10E3/uL 7.6 6.7 8.4  Hemoglobin 13.0 - 17.7 g/dL 14.8 15.2 11.5(L)  Hematocrit 37.5 - 51.0 % 45.5 44.0 33.7(L)  Platelets  150 - 450 x10E3/uL 274 231 206      CMP     Component Value Date/Time   NA 140 04/15/2021 1032   K 4.1 04/15/2021 1032   CL 102 04/15/2021 1032   CO2 19 (L) 04/15/2021 1032   GLUCOSE 133 (H) 04/15/2021 1032   GLUCOSE 171 (H) 02/26/2018 0604   BUN 20 04/15/2021 1032   CREATININE 1.31 (H) 04/15/2021 1032   CALCIUM 10.1 04/15/2021 1032   PROT 7.6 02/25/2021 0923   ALBUMIN 5.0 (H) 02/25/2021 0923   AST 24 02/25/2021 0923   ALT 28 02/25/2021 0923   ALKPHOS 46 02/25/2021 0923   BILITOT 0.5 02/25/2021 0923   GFRNONAA 71 08/09/2020 1056   GFRAA 82 08/09/2020 1056     No results found.     Assessment & Plan:   1. Intermittent claudication (HCC) Recommend:  I do not find evidence of Vascular pathology that would explain the patient's symptoms  The patient has atypical pain symptoms for vascular disease  I do not find evidence of Vascular pathology that would explain the patient's symptoms and I suspect the patient is c/o pseudoclaudication.  Patient should have an evaluation of his LS spine which I defer to the primary service.  Noninvasive studies of the legs do not identify vascular problems  The patient should continue walking and begin a more formal exercise program. The patient should continue his antiplatelet therapy and aggressive treatment of the lipid abnormalities. The patient should begin wearing graduated compression socks 15-20 mmHg strength to control her mild edema.  Patient will follow-up with me on a PRN basis  Further work-up of her lower extremity pain is  deferred to the primary service      2. Hypertension associated with diabetes ([place]) Continue antihypertensive medications as already ordered, these medications have been reviewed and there are no changes at this time.   3. Hyperlipidemia associated with type 2 diabetes mellitus ([place]) Continue statin as ordered and reviewed, no changes at this time    Current Outpatient Medications on File Prior to Visit  Medication Sig Dispense Refill   amLODipine (NORVASC) 5 MG tablet Take 1 tablet (5 mg total) by mouth daily. 90 tablet 1   aspirin 81 MG tablet Take 81 mg by mouth daily.      atorvastatin (LIPITOR) 80 MG tablet TAKE ONE (1) TABLET EACH DAY 90 tablet 3   blood glucose meter kit and supplies KIT Dispense based on patient and insurance preference. Check sugars once daily 1 each 0   Calcium Citrate-Vitamin D (CALCIUM + D PO) Take 1 tablet by mouth daily.     clopidogrel (PLAVIX) 75 MG tablet Take 1 tablet (75 mg total) by mouth daily. 90 tablet 1   Dulaglutide (TRULICITY) 4.5 XQ/1.1HE SOPN Inject 4.5 mg as directed once a week. 2 mL 5   empagliflozin (JARDIANCE) 25 MG TABS tablet Take 1 tablet (25 mg total) by mouth daily. 90 tablet 3   ezetimibe (ZETIA) 10 MG tablet Take 1 tablet (10 mg total) by mouth daily. 90 tablet 3   fenofibrate (TRICOR) 145 MG tablet Take 1 tablet (145 mg total) by mouth daily. 90 tablet 1   furosemide (LASIX) 20 MG tablet Take by mouth.     glucose blood (ONETOUCH VERIO) test strip TEST BLOOD GLUCOSE EVERY DAY 50 each 5   hydrochlorothiazide (HYDRODIURIL) 25 MG tablet Take 1 tablet (25 mg total) by mouth daily. 90 tablet 1   HYDROcodone-acetaminophen (NORCO) 5-325 MG tablet Take  1-2 tablets by mouth every 6 (six) hours as needed for moderate pain. 60 tablet 0   lisinopril (ZESTRIL) 20 MG tablet Take 1 tablet (20 mg total) by mouth daily. 90 tablet 3   metFORMIN (GLUCOPHAGE) 1000 MG tablet TAKE ONE (1) TABLET BY MOUTH TWO (2) TIMES DAILY WITH FOOD 180 tablet 1    metoprolol tartrate (LOPRESSOR) 50 MG tablet Take 1 tablet (50 mg total) by mouth 2 (two) times daily. 180 tablet 1   pantoprazole (PROTONIX) 40 MG tablet TAKE 1 TABLET BY MOUTH EVERY DAY AS NEEDED FOR HEARTBURN OR ACID REFLUX 90 tablet 1   Potassium Gluconate 2.5 MEQ TABS Take by mouth daily.      No current facility-administered medications on file prior to visit.    There are no Patient Instructions on file for this visit. No follow-ups on file.   Kris Hartmann, NP

## 2021-05-18 ENCOUNTER — Other Ambulatory Visit: Payer: Self-pay | Admitting: Family Medicine

## 2021-05-18 DIAGNOSIS — E1165 Type 2 diabetes mellitus with hyperglycemia: Secondary | ICD-10-CM

## 2021-05-18 DIAGNOSIS — I152 Hypertension secondary to endocrine disorders: Secondary | ICD-10-CM

## 2021-05-18 DIAGNOSIS — I2510 Atherosclerotic heart disease of native coronary artery without angina pectoris: Secondary | ICD-10-CM

## 2021-05-18 DIAGNOSIS — E1169 Type 2 diabetes mellitus with other specified complication: Secondary | ICD-10-CM

## 2021-05-18 DIAGNOSIS — E785 Hyperlipidemia, unspecified: Secondary | ICD-10-CM

## 2021-05-18 DIAGNOSIS — E1159 Type 2 diabetes mellitus with other circulatory complications: Secondary | ICD-10-CM

## 2021-05-24 ENCOUNTER — Other Ambulatory Visit: Payer: Self-pay | Admitting: Family Medicine

## 2021-05-24 NOTE — Telephone Encounter (Signed)
Medication Refill - Medication: Hydrocodone   Has the patient contacted their pharmacy? No. Pts daughter states that she always has to call this into office. Please advise. (Agent: If no, request that the patient contact the pharmacy for the refill.) (Agent: If yes, when and what did the pharmacy advise?)  Preferred Pharmacy (with phone number or street name):  [facility][id number] [place], [place] - [address] AT [facility] OF [address] [facility]  [address] [place] [address]  Phone: [phone] Fax: [phone]  Hours: Not open 24 hours    Agent: Please be advised that RX refills may take up to 3 business days. We ask that you follow-up with your pharmacy.

## 2021-05-24 NOTE — Telephone Encounter (Signed)
Requested medication (s) are due for refill today: no- note attached to prescription "not to fill <30 days from last refill."   Requested medication (s) are on the active medication list: yes  Last refill:  04/26/21 #60  Future visit scheduled: yes  Notes to clinic:  med not delegated to NT to RF   Requested Prescriptions  Pending Prescriptions Disp Refills   HYDROcodone-acetaminophen (NORCO) 5-325 MG tablet 60 tablet 0    Sig: Take 1-2 tablets by mouth every 6 (six) hours as needed for moderate pain.      Not Delegated - Analgesics:  Opioid Agonist Combinations Failed - 05/24/2021  4:01 PM      Failed - This refill cannot be delegated      Failed - Urine Drug Screen completed in last 360 days      Passed - Valid encounter within last 6 months    Recent Outpatient Visits           2 months ago Encounter for annual physical exam   [facility] [place], Marzella Schlein, MD   6 months ago Type 2 diabetes mellitus with hyperglycemia, without long-term current use of insulin [facility])   [facility] [place], Marzella Schlein, MD   9 months ago Type 2 diabetes mellitus with other specified complication, without long-term current use of insulin [facility])   [facility] [place], Marzella Schlein, MD   1 year ago Type 2 diabetes mellitus with other specified complication, without long-term current use of insulin [facility])   [facility] [place], [place], [place]   1 year ago Acute flank pain   [facility] [place], Marzella Schlein, MD       Future Appointments             In 1 week Bacigalupo, Marzella Schlein, MD [facility]

## 2021-05-25 NOTE — Telephone Encounter (Signed)
Please review. Has f/u appt with you on 05/31/21. THanks!

## 2021-05-26 MED ORDER — HYDROCODONE-ACETAMINOPHEN 5-325 MG PO TABS: 1.0000 | ORAL_TABLET | Freq: Four times a day (QID) | ORAL | 0 refills | Status: DC | PRN

## 2021-05-26 NOTE — Telephone Encounter (Signed)
Pt is out of medication and hoping to get today.

## 2021-05-31 ENCOUNTER — Encounter: Payer: Self-pay | Admitting: Family Medicine

## 2021-05-31 ENCOUNTER — Other Ambulatory Visit: Payer: Self-pay

## 2021-05-31 ENCOUNTER — Ambulatory Visit (INDEPENDENT_AMBULATORY_CARE_PROVIDER_SITE_OTHER): Payer: PPO | Admitting: Family Medicine

## 2021-05-31 VITALS — BP 126/84 | HR 78 | Temp 98.2°F | Resp 16 | Wt 198.2 lb

## 2021-05-31 DIAGNOSIS — E1159 Type 2 diabetes mellitus with other circulatory complications: Secondary | ICD-10-CM

## 2021-05-31 DIAGNOSIS — E1169 Type 2 diabetes mellitus with other specified complication: Secondary | ICD-10-CM

## 2021-05-31 DIAGNOSIS — N182 Chronic kidney disease, stage 2 (mild): Secondary | ICD-10-CM | POA: Insufficient documentation

## 2021-05-31 DIAGNOSIS — I152 Hypertension secondary to endocrine disorders: Secondary | ICD-10-CM

## 2021-05-31 DIAGNOSIS — E785 Hyperlipidemia, unspecified: Secondary | ICD-10-CM | POA: Diagnosis not present

## 2021-05-31 DIAGNOSIS — M47816 Spondylosis without myelopathy or radiculopathy, lumbar region: Secondary | ICD-10-CM | POA: Diagnosis not present

## 2021-05-31 DIAGNOSIS — N1832 Chronic kidney disease, stage 3b: Secondary | ICD-10-CM | POA: Insufficient documentation

## 2021-05-31 LAB — POCT GLYCOSYLATED HEMOGLOBIN (HGB A1C): Hemoglobin A1C: 7.5 % — AB (ref 4.0–5.6)

## 2021-05-31 MED ORDER — HYDROCODONE-ACETAMINOPHEN 5-325 MG PO TABS: 1.0000 | ORAL_TABLET | Freq: Four times a day (QID) | ORAL | 0 refills | Status: DC | PRN

## 2021-05-31 NOTE — Assessment & Plan Note (Signed)
Well controlled ?Continue current medications ?Reviewed recent metabolic panel ?F/u in 3 months  ?

## 2021-05-31 NOTE — Assessment & Plan Note (Signed)
With associated chroinc pain Continue Norco at current dose Database reviewed

## 2021-05-31 NOTE — Assessment & Plan Note (Signed)
Stable Higher goal A1c of 7.5 with advanced age Associated with HTN and HLD UTD on screenings and vaccines On ACEi and statin Continue jardiance, metformin, trulicity - all at max dose Continue working on lifestyle

## 2021-05-31 NOTE — Assessment & Plan Note (Signed)
Previously well controlled Continue statin and zetia and fenofibrates

## 2021-05-31 NOTE — Assessment & Plan Note (Signed)
Continue to monitor Avoid nephrotoxic meds 

## 2021-05-31 NOTE — Progress Notes (Signed)
Established patient visit   Patient: [name]   DOB: 09/16/1951   70 y.o. Male  MRN: [id number] Visit Date: 05/31/2021  Today's healthcare provider: Lavon Paganini, MD   Chief Complaint  Patient presents with   Diabetes   Hypertension   Hyperlipidemia   Subjective    Diabetes Pertinent negatives for hypoglycemia include no dizziness or headaches. Pertinent negatives for diabetes include no chest pain, no fatigue and no weakness.  Hypertension Pertinent negatives include no chest pain, headaches, neck pain, palpitations or shortness of breath.  Hyperlipidemia Pertinent negatives include no chest pain, myalgias or shortness of breath.    Diabetes Mellitus Type II, follow-up  Lab Results  Component Value Date   HGBA1C 7.5 (A) 05/31/2021   HGBA1C 7.7 (H) 02/25/2021   HGBA1C 7.6 (A) 11/23/2020   Last seen for diabetes 3 months ago.  Management since then includes continuing the same treatment.   Tolerating 25 mg jardiance, 1000 mg metformin and 4.5 mg trulicity.   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  He is not having side effects.   Home blood sugar records:  149-155  Episodes of hypoglycemia? No    Current insulin regiment: none Most Recent Eye Exam: 12/01/2020  --------------------------------------------------------------------------------------------------- Hypertension, follow-up  BP Readings from Last 3 Encounters:  05/31/21 126/84  05/04/21 121/74  02/25/21 124/84   Wt Readings from Last 3 Encounters:  05/31/21 198 lb 3.2 oz (89.9 kg)  05/04/21 199 lb (90.3 kg)  02/25/21 197 lb 4.8 oz (89.5 kg)     He was last seen for hypertension 3 months ago.   BP at that visit was 124/84. Management since that visit includes none.  He reports excellent compliance with treatment. He is not having side effects.   He is not exercising. He is adherent to low salt diet.    Outside blood pressures are not being checked.  He does not smoke.  Use of agents associated with hypertension: NSAIDS.   --------------------------------------------------------------------------------------------------- Lipid/Cholesterol, follow-up  Last Lipid Panel: Lab Results  Component Value Date   CHOL 147 02/25/2021   LDLCALC 86 02/25/2021   HDL 25 (L) 02/25/2021   TRIG 214 (H) 02/25/2021   He was last seen for this 3 months ago.  Management since that visit includes Continue 145 mg fenofibrate, 10 mg zetia, 80 mg atorvastatin .  He reports excellent compliance with treatment. He is not having side effects.   Symptoms: No appetite changes No foot ulcerations  No chest pain No chest pressure/discomfort  No dyspnea No orthopnea  No fatigue Yes lower extremity edema  No palpitations No paroxysmal nocturnal dyspnea  No nausea No numbness or tingling of extremity  No polydipsia No polyuria  No speech difficulty No syncope   He is following a Regular diet. Current exercise: no regular exercise  Last metabolic panel Lab Results  Component Value Date   GLUCOSE 133 (H) 04/15/2021   NA 140 04/15/2021   K 4.1 04/15/2021   BUN 20 04/15/2021   CREATININE 1.31 (H) 04/15/2021   GFRNONAA 71 08/09/2020   GFRAA 82 08/09/2020   CALCIUM 10.1 04/15/2021   AST 24 02/25/2021   ALT 28 02/25/2021   The 10-year ASCVD risk score (Mikey BussingDC Jr., et al., 2013) is: 42.5%  ---------------------------------------------------------------------------------------------------   Patient Active Problem List   Diagnosis Date Noted   Stage 2 chronic kidney disease 05/31/2021   Intermittent claudication ([place] 02/25/2021  Overweight 01/22/2020   Spondylosis of lumbar region without myelopathy or radiculopathy 03/08/2016   Arteriosclerosis of coronary artery 06/04/2015   S/P coronary artery stent placement 06/04/2015   CAD in native artery 06/04/2015   Hypertension associated with  diabetes ([place]) 05/28/2015   Hyperlipidemia associated with type 2 diabetes mellitus ([place]) 05/28/2015   Insomnia 05/28/2015   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 05/28/2015   Gastro-esophageal reflux disease without esophagitis 05/28/2015   T2DM (type 2 diabetes mellitus) ([place]) 05/28/2015   Past Medical History:  Diagnosis Date   Arthritis    osteoarthriis of back   Coronary artery disease    Depression    Diabetes mellitus without complication (HCC)    GERD (gastroesophageal reflux disease)    Hyperlipidemia    Hypertension                                    Allergies  Allergen Reactions   Amoxicillin     diarrhea and headache Has patient had a PCN reaction causing immediate rash, facial/tongue/throat swelling, SOB or lightheadedness with hypotension: No Has patient had a PCN reaction causing severe rash involving mucus membranes or skin necrosis: No Has patient had a PCN reaction that required hospitalization: No Has patient had a PCN reaction occurring within the last 10 years: Unknown If all of the above answers are "NO", then may proceed with Cephalosporin use.    Fexofenadine     Unknown reaction        Medications: Outpatient Medications Prior to Visit  Medication Sig   amLODipine (NORVASC) 5 MG tablet Take 1 tablet (5 mg total) by mouth daily.   aspirin 81 MG tablet Take 81 mg by mouth daily.    atorvastatin (LIPITOR) 80 MG tablet TAKE ONE (1) TABLET EACH DAY   blood glucose meter kit and supplies KIT Dispense based on patient and insurance preference. Check sugars once daily   Calcium Citrate-Vitamin D (CALCIUM + D PO) Take 1 tablet by mouth daily.   clopidogrel (PLAVIX) 75 MG tablet TAKE 1 TABLET(75 MG) BY MOUTH DAILY   Dulaglutide (TRULICITY) 4.5 IR/4.4RX SOPN Inject 4.5 mg as directed once a week.   empagliflozin (JARDIANCE) 25 MG TABS tablet Take 1 tablet (25 mg total) by mouth daily.   ezetimibe (ZETIA) 10 MG tablet Take 1 tablet (10 mg total) by mouth daily.    fenofibrate (TRICOR) 145 MG tablet TAKE 1 TABLET(145 MG) BY MOUTH DAILY   furosemide (LASIX) 20 MG tablet Take by mouth.   glucose blood (ONETOUCH VERIO) test strip TEST BLOOD GLUCOSE EVERY DAY   hydrochlorothiazide (HYDRODIURIL) 25 MG tablet TAKE 1 TABLET(25 MG) BY MOUTH DAILY   lisinopril (ZESTRIL) 20 MG tablet Take 1 tablet (20 mg total) by mouth daily.   metFORMIN (GLUCOPHAGE) 1000 MG tablet TAKE 1 TABLET BY MOUTH TWICE DAILY WITH FOOD   metoprolol tartrate (LOPRESSOR) 50 MG tablet TAKE 1 TABLET(50 MG) BY MOUTH TWICE DAILY   pantoprazole (PROTONIX) 40 MG tablet TAKE 1 TABLET BY MOUTH EVERY DAY AS NEEDED FOR HEARTBURN OR ACID REFLUX   Potassium Gluconate 2.5 MEQ TABS Take by mouth daily.    [DISCONTINUED] HYDROcodone-acetaminophen (NORCO) 5-325 MG tablet Take 1-2 tablets by mouth every 6 (six) hours as needed for moderate pain.   No facility-administered medications prior to visit.   Review of Systems  Constitutional:  Negative for chills, fatigue and fever.  HENT:  Negative for ear  pain, sinus pressure, sinus pain and sore throat.   Eyes:  Negative for pain and visual disturbance.  Respiratory:  Negative for cough, chest tightness, shortness of breath and wheezing.   Cardiovascular:  Negative for chest pain, palpitations and leg swelling.  Gastrointestinal:  Negative for abdominal pain, blood in stool, diarrhea, nausea and vomiting.  Genitourinary:  Negative for dysuria, flank pain, frequency and urgency.  Musculoskeletal:  Negative for back pain, myalgias and neck pain.  Neurological:  Negative for dizziness, weakness, light-headedness, numbness and headaches.      Objective    BP 126/84   Pulse 78   Temp 98.2 F (36.8 C) (Oral)   Resp 16   Wt 198 lb 3.2 oz (89.9 kg)   SpO2 99%   BMI 26.88 kg/m     Physical Exam Vitals reviewed.  Constitutional:      General: He is not in acute distress.    Appearance: Normal appearance. He is not diaphoretic.  HENT:     Head:  Normocephalic and atraumatic.  Eyes:     General: No scleral icterus.    Conjunctiva/sclera: Conjunctivae normal.  Cardiovascular:     Rate and Rhythm: Normal rate and regular rhythm.     Pulses: Normal pulses.     Heart sounds: Normal heart sounds. No murmur heard. Pulmonary:     Effort: Pulmonary effort is normal. No respiratory distress.     Breath sounds: Normal breath sounds. No wheezing or rhonchi.  Abdominal:     General: There is no distension.     Palpations: Abdomen is soft.     Tenderness: There is no abdominal tenderness.  Musculoskeletal:     Cervical back: Neck supple.     Right lower leg: No edema.     Left lower leg: No edema.  Lymphadenopathy:     Cervical: No cervical adenopathy.  Skin:    General: Skin is warm and dry.     Capillary Refill: Capillary refill takes less than 2 seconds.     Findings: No rash.  Neurological:     Mental Status: He is alert and oriented to person, place, and time.     Cranial Nerves: No cranial nerve deficit.  Psychiatric:        Mood and Affect: Mood normal.        Behavior: Behavior normal.     Results for orders placed or performed in visit on 05/31/21  POCT glycosylated hemoglobin (Hb A1C)  Result Value Ref Range   Hemoglobin A1C 7.5 (A) 4.0 - 5.6 %   HbA1c POC (<> result, manual entry)     HbA1c, POC (prediabetic range)     HbA1c, POC (controlled diabetic range)      Assessment & Plan     Problem List Items Addressed This Visit       Cardiovascular and Mediastinum   Hypertension associated with diabetes ([place])    Well controlled Continue current medications Reviewed recent metabolic panel F/u in 3 months         Endocrine   Hyperlipidemia associated with type 2 diabetes mellitus ([place])    Previously well controlled Continue statin and zetia and fenofibrates      T2DM (type 2 diabetes mellitus) ([place]) - Primary    Stable Higher goal A1c of 7.5 with advanced age Associated with HTN and HLD UTD on  screenings and vaccines On ACEi and statin Continue jardiance, metformin, trulicity - all at max dose Continue working on lifestyle  Relevant Orders   POCT glycosylated hemoglobin (Hb A1C) (Completed)     Musculoskeletal and Integument   Spondylosis of lumbar region without myelopathy or radiculopathy    With associated chroinc pain Continue Norco at current dose Database reviewed      Relevant Medications   HYDROcodone-acetaminophen (NORCO) 5-325 MG tablet   HYDROcodone-acetaminophen (NORCO) 5-325 MG tablet   HYDROcodone-acetaminophen (NORCO) 5-325 MG tablet     Genitourinary   Stage 2 chronic kidney disease    Continue to monitor  Avoid nephrotoxic meds       Return in about 3 months (around 08/31/2021) for chronic disease f/u.      I,Essence Turner,acting as a Education administrator for Lavon Paganini, MD.,have documented all relevant documentation on the behalf of Lavon Paganini, MD,as directed by  Lavon Paganini, MD while in the presence of Lavon Paganini, MD.  I, Lavon Paganini, MD, have reviewed all documentation for this visit. The documentation on 06/02/21 for the exam, diagnosis, procedures, and orders are all accurate and complete.   Jakell Trusty, Dionne Bucy, MD, MPH [facility]

## 2021-06-21 ENCOUNTER — Telehealth: Payer: Self-pay | Admitting: Family Medicine

## 2021-06-21 ENCOUNTER — Encounter: Payer: Self-pay | Admitting: Family Medicine

## 2021-06-21 ENCOUNTER — Other Ambulatory Visit: Payer: Self-pay

## 2021-06-21 ENCOUNTER — Ambulatory Visit (INDEPENDENT_AMBULATORY_CARE_PROVIDER_SITE_OTHER): Payer: PPO | Admitting: Family Medicine

## 2021-06-21 VITALS — BP 115/76 | HR 86 | Temp 98.3°F | Resp 16 | Wt 200.0 lb

## 2021-06-21 DIAGNOSIS — N182 Chronic kidney disease, stage 2 (mild): Secondary | ICD-10-CM | POA: Diagnosis not present

## 2021-06-21 DIAGNOSIS — E1169 Type 2 diabetes mellitus with other specified complication: Secondary | ICD-10-CM

## 2021-06-21 DIAGNOSIS — M545 Low back pain, unspecified: Secondary | ICD-10-CM | POA: Insufficient documentation

## 2021-06-21 DIAGNOSIS — M47816 Spondylosis without myelopathy or radiculopathy, lumbar region: Secondary | ICD-10-CM | POA: Diagnosis not present

## 2021-06-21 DIAGNOSIS — E663 Overweight: Secondary | ICD-10-CM

## 2021-06-21 LAB — POCT URINALYSIS DIPSTICK
Bilirubin, UA: NEGATIVE
Blood, UA: NEGATIVE
Glucose, UA: POSITIVE — AB
Ketones, UA: NEGATIVE
Leukocytes, UA: NEGATIVE
Nitrite, UA: NEGATIVE
Protein, UA: NEGATIVE
Spec Grav, UA: 1.01 (ref 1.010–1.025)
Urobilinogen, UA: 1 E.U./dL
pH, UA: 6.5 (ref 5.0–8.0)

## 2021-06-21 MED ORDER — MELOXICAM 15 MG PO TABS
15.0000 mg | ORAL_TABLET | Freq: Every day | ORAL | 0 refills | Status: DC
Start: 2021-06-21 — End: 2021-11-01

## 2021-06-21 MED ORDER — EZETIMIBE 10 MG PO TABS: 10.0000 mg | ORAL_TABLET | Freq: Every day | ORAL | 1 refills | Status: DC

## 2021-06-21 MED ORDER — CYCLOBENZAPRINE HCL 5 MG PO TABS
5.0000 mg | ORAL_TABLET | Freq: Three times a day (TID) | ORAL | 1 refills | Status: DC | PRN
Start: 2021-06-21 — End: 2021-12-08

## 2021-06-21 NOTE — Telephone Encounter (Signed)
[facility] faxed refill request for the following medications:  ezetimibe (ZETIA) 10 MG tablet   Please advise.  

## 2021-06-21 NOTE — Assessment & Plan Note (Signed)
Weight stable No fluid shift noticed on exam in abd or back/flank

## 2021-06-21 NOTE — Progress Notes (Signed)
Established patient visit   Patient: [name]   DOB: December 11, 1950   70 y.o. Male  MRN: [id number] Visit Date: 06/21/2021  Today's healthcare provider: Gwyneth Sprout, FNP   Chief Complaint  Patient presents with   Back Pain   Subjective  -------------------------------------------------------------------------------------------------------------------- Back Pain This is a new problem. The current episode started in the past 7 days. The problem occurs intermittently. The problem is unchanged. The pain is present in the lumbar spine. The pain is at a severity of 8/10. The symptoms are aggravated by bending, position and twisting. Pertinent negatives include no abdominal pain, bladder incontinence, bowel incontinence, chest pain, dysuria, fever, headaches, leg pain, numbness, paresis, paresthesias, pelvic pain, perianal numbness, tingling, weakness or weight loss.       Medications: Outpatient Medications Prior to Visit  Medication Sig   amLODipine (NORVASC) 5 MG tablet Take 1 tablet (5 mg total) by mouth daily.   aspirin 81 MG tablet Take 81 mg by mouth daily.    atorvastatin (LIPITOR) 80 MG tablet TAKE ONE (1) TABLET EACH DAY   blood glucose meter kit and supplies KIT Dispense based on patient and insurance preference. Check sugars once daily   Calcium Citrate-Vitamin D (CALCIUM + D PO) Take 1 tablet by mouth daily.   clopidogrel (PLAVIX) 75 MG tablet TAKE 1 TABLET(75 MG) BY MOUTH DAILY   Dulaglutide (TRULICITY) 4.5 PO/1.4DC SOPN Inject 4.5 mg as directed once a week.   empagliflozin (JARDIANCE) 25 MG TABS tablet Take 1 tablet (25 mg total) by mouth daily.   ezetimibe (ZETIA) 10 MG tablet Take 1 tablet (10 mg total) by mouth daily.   fenofibrate (TRICOR) 145 MG tablet TAKE 1 TABLET(145 MG) BY MOUTH DAILY   furosemide (LASIX) 20 MG tablet Take by mouth.   glucose blood (ONETOUCH VERIO) test strip TEST BLOOD GLUCOSE EVERY DAY   hydrochlorothiazide (HYDRODIURIL) 25 MG tablet  TAKE 1 TABLET(25 MG) BY MOUTH DAILY   HYDROcodone-acetaminophen (NORCO) 5-325 MG tablet Take 1-2 tablets by mouth every 6 (six) hours as needed for moderate pain.   HYDROcodone-acetaminophen (NORCO) 5-325 MG tablet Take 1-2 tablets by mouth every 6 (six) hours as needed for moderate pain.   HYDROcodone-acetaminophen (NORCO) 5-325 MG tablet Take 1-2 tablets by mouth every 6 (six) hours as needed for moderate pain.   lisinopril (ZESTRIL) 20 MG tablet Take 1 tablet (20 mg total) by mouth daily.   metFORMIN (GLUCOPHAGE) 1000 MG tablet TAKE 1 TABLET BY MOUTH TWICE DAILY WITH FOOD   metoprolol tartrate (LOPRESSOR) 50 MG tablet TAKE 1 TABLET(50 MG) BY MOUTH TWICE DAILY   pantoprazole (PROTONIX) 40 MG tablet TAKE 1 TABLET BY MOUTH EVERY DAY AS NEEDED FOR HEARTBURN OR ACID REFLUX   Potassium Gluconate 2.5 MEQ TABS Take by mouth daily.    No facility-administered medications prior to visit.    Review of Systems  Constitutional:  Negative for fever and weight loss.  Cardiovascular:  Negative for chest pain.  Gastrointestinal:  Negative for abdominal pain and bowel incontinence.  Genitourinary:  Negative for bladder incontinence, dysuria and pelvic pain.  Musculoskeletal:  Positive for back pain.  Neurological:  Negative for tingling, weakness, numbness, headaches and paresthesias.      Objective  -------------------------------------------------------------------------------------------------------------------- BP 115/76   Pulse 86   Temp 98.3 F (36.8 C) (Oral)   Resp 16   Wt 200 lb (90.7 kg)   BMI 27.12 kg/m  Physical Exam Vitals and nursing note reviewed.  Constitutional:  General: He is not in acute distress.    Appearance: Normal appearance. He is not ill-appearing, toxic-appearing or diaphoretic.  Cardiovascular:     Rate and Rhythm: Normal rate and regular rhythm.     Pulses: Normal pulses.     Heart sounds: No murmur heard.   No friction rub. No gallop.  Abdominal:      General: Bowel sounds are normal. There is no distension.     Palpations: Abdomen is soft. There is no mass.     Tenderness: There is no abdominal tenderness. There is no right CVA tenderness, left CVA tenderness, guarding or rebound.     Hernia: No hernia is present.  Musculoskeletal:        General: Tenderness present. No swelling, deformity or signs of injury. Normal range of motion.       Arms:     Cervical back: Normal range of motion.     Right lower leg: No edema.     Left lower leg: No edema.     Comments: Denies CVA tenderness Skin WDL Denies changes to urine Complaints of pulling with certain movements/bending  Skin:    General: Skin is warm and dry.     Capillary Refill: Capillary refill takes less than 2 seconds.     Coloration: Skin is not jaundiced or pale.     Findings: No bruising, erythema, lesion or rash.  Neurological:     General: No focal deficit present.     Mental Status: He is alert and oriented to person, place, and time. Mental status is at baseline.     Cranial Nerves: No cranial nerve deficit.     Sensory: No sensory deficit.     Motor: No weakness.     Coordination: Coordination normal.     Gait: Gait normal.     Deep Tendon Reflexes: Reflexes normal.  Psychiatric:        Mood and Affect: Mood normal.        Behavior: Behavior normal.        Thought Content: Thought content normal.        Judgment: Judgment normal.      Results for orders placed or performed in visit on 06/21/21  POCT urinalysis dipstick  Result Value Ref Range   Color, UA yellow    Clarity, UA clear    Glucose, UA Positive (A) Negative   Bilirubin, UA negative    Ketones, UA negative    Spec Grav, UA 1.010 1.010 - 1.025   Blood, UA negative    pH, UA 6.5 5.0 - 8.0   Protein, UA Negative Negative   Urobilinogen, UA 1.0 0.2 or 1.0 E.U./dL   Nitrite, UA negative    Leukocytes, UA Negative Negative   Appearance     Odor      Assessment & Plan   ---------------------------------------------------------------------------------------------------------------------- Problem List Items Addressed This Visit       Endocrine   T2DM (type 2 diabetes mellitus) (HCC)    Glucose seen on UA No acute concerns with T2DM mgmt        Musculoskeletal and Integument   Spondylosis of lumbar region without myelopathy or radiculopathy    Known back pain; different from today's presentation      Relevant Medications   meloxicam (MOBIC) 15 MG tablet   cyclobenzaprine (FLEXERIL) 5 MG tablet     Genitourinary   Stage 2 chronic kidney disease    Known concern No blood, protein in urine on UA  Other   Overweight    Weight stable No fluid shift noticed on exam in abd or back/flank      Acute bilateral low back pain without sciatica - Primary    R>L Taking home opioid for maintenance; does not improve Avoid prednisone today given DM control Start Mobic and Flexeril- precautions given      Relevant Medications   meloxicam (MOBIC) 15 MG tablet   cyclobenzaprine (FLEXERIL) 5 MG tablet   Other Relevant Orders   POCT urinalysis dipstick (Completed)     Return if symptoms worsen or fail to improve.      Vonna Kotyk, FNP, have reviewed all documentation for this visit. The documentation on 06/21/21 for the exam, diagnosis, procedures, and orders are all accurate and complete.    Gwyneth Sprout, [place] [phone] (phone) [phone] (fax)  [address] [place]

## 2021-06-21 NOTE — Assessment & Plan Note (Signed)
Known back pain; different from today's presentation

## 2021-06-21 NOTE — Assessment & Plan Note (Signed)
Known concern No blood, protein in urine on UA

## 2021-06-21 NOTE — Assessment & Plan Note (Signed)
R>L Taking home opioid for maintenance; does not improve Avoid prednisone today given DM control Start Mobic and Flexeril- precautions given

## 2021-06-21 NOTE — Assessment & Plan Note (Signed)
Glucose seen on UA No acute concerns with T2DM mgmt

## 2021-07-05 ENCOUNTER — Telehealth: Payer: Self-pay | Admitting: Family Medicine

## 2021-07-05 MED ORDER — TRULICITY 4.5 MG/0.5ML ~~LOC~~ SOAJ: 4.5000 mg | SUBCUTANEOUS | 5 refills | Status: DC

## 2021-07-05 NOTE — Telephone Encounter (Signed)
[facility] faxed refill request for the following medications:   TRULICITY 4.5MG /0.5 SDP 0.5ML   Please advise.

## 2021-07-18 DIAGNOSIS — R202 Paresthesia of skin: Secondary | ICD-10-CM | POA: Diagnosis not present

## 2021-07-18 DIAGNOSIS — M79601 Pain in right arm: Secondary | ICD-10-CM | POA: Diagnosis not present

## 2021-07-18 DIAGNOSIS — R7309 Other abnormal glucose: Secondary | ICD-10-CM | POA: Diagnosis not present

## 2021-07-18 DIAGNOSIS — E669 Obesity, unspecified: Secondary | ICD-10-CM | POA: Diagnosis not present

## 2021-07-18 DIAGNOSIS — G629 Polyneuropathy, unspecified: Secondary | ICD-10-CM | POA: Diagnosis not present

## 2021-07-18 DIAGNOSIS — M4713 Other spondylosis with myelopathy, cervicothoracic region: Secondary | ICD-10-CM | POA: Diagnosis not present

## 2021-07-18 DIAGNOSIS — M79602 Pain in left arm: Secondary | ICD-10-CM | POA: Diagnosis not present

## 2021-07-18 LAB — HEMOGLOBIN A1C: Hemoglobin A1C: 8.1

## 2021-07-26 ENCOUNTER — Telehealth: Payer: Self-pay | Admitting: Family Medicine

## 2021-07-26 ENCOUNTER — Other Ambulatory Visit: Payer: Self-pay | Admitting: Family Medicine

## 2021-07-26 DIAGNOSIS — I152 Hypertension secondary to endocrine disorders: Secondary | ICD-10-CM

## 2021-07-26 DIAGNOSIS — E1159 Type 2 diabetes mellitus with other circulatory complications: Secondary | ICD-10-CM

## 2021-07-26 MED ORDER — AMLODIPINE BESYLATE 5 MG PO TABS: 5.0000 mg | ORAL_TABLET | Freq: Every day | ORAL | 1 refills | Status: DC

## 2021-07-26 NOTE — Telephone Encounter (Unsigned)
Copied from CRM [id number]. Topic: Quick Communication - Rx Refill/Question >> Jul 26, 2021  3:46 PM Marylen Ponto wrote: Medication: HYDROcodone-acetaminophen (NORCO) 5-325 MG tablet  Has the patient contacted their pharmacy? Yes.   (Agent: If no, request that the patient contact the pharmacy for the refill.) (Agent: If yes, when and what did the pharmacy advise?)  Preferred Pharmacy (with phone number or street name): [id number] Cheree Ditto, [place] - [address] AT [facility] OF [address] [facility] Phone: [phone]  Fax: [phone]  Has the patient been seen for an appointment in the last year OR does the patient have an upcoming appointment? Yes.    Agent: Please be advised that RX refills may take up to 3 business days. We ask that you follow-up with your pharmacy.

## 2021-07-26 NOTE — Telephone Encounter (Signed)
[facility] faxed refill request for the following medications:   amLODipine (NORVASC) 5 MG tablet   Please advise.  

## 2021-07-27 ENCOUNTER — Other Ambulatory Visit: Payer: Self-pay

## 2021-07-27 DIAGNOSIS — I152 Hypertension secondary to endocrine disorders: Secondary | ICD-10-CM

## 2021-07-27 DIAGNOSIS — E1159 Type 2 diabetes mellitus with other circulatory complications: Secondary | ICD-10-CM

## 2021-07-27 MED ORDER — AMLODIPINE BESYLATE 5 MG PO TABS: 5.0000 mg | ORAL_TABLET | Freq: Every day | ORAL | 0 refills | Status: DC

## 2021-07-27 NOTE — Telephone Encounter (Signed)
Requested medication (s) are due for refill today: yes  Requested medication (s) are on the active medication list: yes   Last refill: 05/31/21  #60  0 refills  Future visit scheduled yes 09/02/21  Notes to clinic: not delegated  Requested Prescriptions  Pending Prescriptions Disp Refills   HYDROcodone-acetaminophen (NORCO) 5-325 MG tablet 60 tablet 0    Sig: Take 1-2 tablets by mouth every 6 (six) hours as needed for moderate pain.     Not Delegated - Analgesics:  Opioid Agonist Combinations Failed - 07/26/2021  4:22 PM      Failed - This refill cannot be delegated      Failed - Urine Drug Screen completed in last 360 days      Passed - Valid encounter within last 6 months    Recent Outpatient Visits           1 month ago Acute bilateral low back pain without sciatica   [facility] Merita Norton T, FNP   1 month ago Type 2 diabetes mellitus with other specified complication, without long-term current use of insulin [facility])   [facility], Marzella Schlein, MD   5 months ago Encounter for annual physical exam   [facility], Marzella Schlein, MD   8 months ago Type 2 diabetes mellitus with hyperglycemia, without long-term current use of insulin [facility])   [facility], Marzella Schlein, MD   11 months ago Type 2 diabetes mellitus with other specified complication, without long-term current use of insulin [facility])   [facility] Bacigalupo, Marzella Schlein, MD       Future Appointments             In 1 month Bacigalupo, Marzella Schlein, MD [facility], [facility]

## 2021-07-28 NOTE — Telephone Encounter (Signed)
Due 1/19

## 2021-08-03 DIAGNOSIS — R202 Paresthesia of skin: Secondary | ICD-10-CM | POA: Diagnosis not present

## 2021-08-03 DIAGNOSIS — M79602 Pain in left arm: Secondary | ICD-10-CM | POA: Diagnosis not present

## 2021-08-03 DIAGNOSIS — M79601 Pain in right arm: Secondary | ICD-10-CM | POA: Diagnosis not present

## 2021-08-16 ENCOUNTER — Telehealth: Payer: Self-pay

## 2021-08-16 MED ORDER — SEMAGLUTIDE (2 MG/DOSE) 8 MG/3ML ~~LOC~~ SOPN: 2.0000 mg | PEN_INJECTOR | SUBCUTANEOUS | 3 refills | Status: DC

## 2021-08-16 NOTE — Telephone Encounter (Signed)
If we have samples, please give them those.  If not, we can try Ozempic in place of Trulicity.  He would need the 2 mg weekly dose.

## 2021-08-16 NOTE — Addendum Note (Signed)
Addended by: Hyacinth Meeker on: 08/16/2021 03:18 PM   Modules accepted: Orders

## 2021-08-16 NOTE — Telephone Encounter (Signed)
Copied from CRM [id number]. Topic: General - Other >> Aug 15, 2021  2:41 PM Marylen Ponto wrote: Reason for CRM: Pt daughter stated they have tried filling there Rx for Dulaglutide (TRULICITY) 4.5 MG/0.5ML SOPN but it is on backorder. Pt has no more medication after today and requests call back to advise. Cb# [phone]

## 2021-08-16 NOTE — Telephone Encounter (Signed)
We do not have any samples. Sent in new prescription for Ozempic. Left detailed message for patient with new prescription.

## 2021-08-19 NOTE — Telephone Encounter (Signed)
[name] advised that all providers have left for the day. Will fu on Monday.

## 2021-08-19 NOTE — Telephone Encounter (Signed)
Caller stated that Ozempic is also out of stock, please advise if there is an alternative since the pt is out of medication.

## 2021-08-22 NOTE — Telephone Encounter (Signed)
Can we find out if another pharmacy has Trulicity in stock or if the 3mg  dose is in stock?

## 2021-08-23 ENCOUNTER — Other Ambulatory Visit: Payer: Self-pay | Admitting: Family Medicine

## 2021-08-23 ENCOUNTER — Telehealth: Payer: Self-pay

## 2021-08-23 NOTE — Telephone Encounter (Signed)
Copied from CRM [id number]. Topic: General - Other >> Aug 23, 2021  2:37 PM [name] A wrote: Reason for CRM: The patient's daughter has made a call for an update about their glucose concerns for the patient  [name] would like to know if staff have been able to find a pharmacy that has the patient's Trulicity  in stock   The patient's Blood Sugar was 132 when taken around 6 AM today 08/23/21  Please contact further when possible

## 2021-08-23 NOTE — Telephone Encounter (Signed)
Copied from CRM [id number]. Topic: Quick Communication - Rx Refill/Question >> Aug 23, 2021  2:40 PM Gaetana Michaelis A wrote: Medication: HYDROcodone-acetaminophen (NORCO) 5-325 MG tablet [[id number]  Has the patient contacted their pharmacy? No.   (Agent: If no, request that the patient contact the pharmacy for the refill. If patient does not wish to contact the pharmacy document the reason why and proceed with request.) (Agent: If yes, when and what did the pharmacy advise?)  Preferred Pharmacy (with phone number or street name): [id number] [place], [place] - [address] AT [facility] OF [address] [facility]  Phone:  [phone] Fax:  [phone]  Has the patient been seen for an appointment in the last year OR does the patient have an upcoming appointment? Yes.    Agent: Please be advised that RX refills may take up to 3 business days. We ask that you follow-up with your pharmacy.

## 2021-08-23 NOTE — Telephone Encounter (Signed)
Requested medication (s) are due for refill today - yes  Requested medication (s) are on the active medication list -yes  Future visit scheduled -yes  Last refill: 05/31/21 #60 - 3 Rx  Notes to clinic: Request RF: non delegated Rx  Requested Prescriptions  Pending Prescriptions Disp Refills   HYDROcodone-acetaminophen (NORCO) 5-325 MG tablet 60 tablet 0    Sig: Take 1-2 tablets by mouth every 6 (six) hours as needed for moderate pain.     Not Delegated - Analgesics:  Opioid Agonist Combinations Failed - 08/23/2021  3:04 PM      Failed - This refill cannot be delegated      Failed - Urine Drug Screen completed in last 360 days      Passed - Valid encounter within last 6 months    Recent Outpatient Visits           2 months ago Acute bilateral low back pain without sciatica   [facility] Merita Norton T, FNP   2 months ago Type 2 diabetes mellitus with other specified complication, without long-term current use of insulin [facility])   [facility], Marzella Schlein, MD   5 months ago Encounter for annual physical exam   [facility], Marzella Schlein, MD   9 months ago Type 2 diabetes mellitus with hyperglycemia, without long-term current use of insulin [facility])   [facility], Marzella Schlein, MD   1 year ago Type 2 diabetes mellitus with other specified complication, without long-term current use of insulin [facility])   [facility], Marzella Schlein, MD       Future Appointments             In 1 week Bacigalupo, Marzella Schlein, MD [facility]               Requested Prescriptions  Pending Prescriptions Disp Refills   HYDROcodone-acetaminophen (NORCO) 5-325 MG tablet 60 tablet 0    Sig: Take 1-2 tablets by mouth every 6 (six) hours as needed for moderate pain.     Not Delegated - Analgesics:  Opioid Agonist Combinations Failed - 08/23/2021  3:04 PM      Failed - This refill cannot  be delegated      Failed - Urine Drug Screen completed in last 360 days      Passed - Valid encounter within last 6 months    Recent Outpatient Visits           2 months ago Acute bilateral low back pain without sciatica   [facility] Merita Norton T, FNP   2 months ago Type 2 diabetes mellitus with other specified complication, without long-term current use of insulin [facility])   [facility], Marzella Schlein, MD   5 months ago Encounter for annual physical exam   [facility], Marzella Schlein, MD   9 months ago Type 2 diabetes mellitus with hyperglycemia, without long-term current use of insulin [facility])   [facility], Marzella Schlein, MD   1 year ago Type 2 diabetes mellitus with other specified complication, without long-term current use of insulin [facility])   [facility] Bacigalupo, Marzella Schlein, MD       Future Appointments             In 1 week Bacigalupo, Marzella Schlein, MD [facility]

## 2021-08-23 NOTE — Telephone Encounter (Signed)
[facility] has Trulicity ready for patient. Patient's daughter advised.

## 2021-08-23 NOTE — Telephone Encounter (Signed)
Updated crystal on trulicity being available at [facility].

## 2021-08-25 MED ORDER — HYDROCODONE-ACETAMINOPHEN 5-325 MG PO TABS: 1.0000 | ORAL_TABLET | Freq: Four times a day (QID) | ORAL | 0 refills | Status: DC | PRN

## 2021-08-29 DIAGNOSIS — I2510 Atherosclerotic heart disease of native coronary artery without angina pectoris: Secondary | ICD-10-CM | POA: Diagnosis not present

## 2021-08-29 DIAGNOSIS — Z955 Presence of coronary angioplasty implant and graft: Secondary | ICD-10-CM | POA: Diagnosis not present

## 2021-08-29 DIAGNOSIS — E119 Type 2 diabetes mellitus without complications: Secondary | ICD-10-CM | POA: Diagnosis not present

## 2021-08-29 DIAGNOSIS — Z23 Encounter for immunization: Secondary | ICD-10-CM | POA: Diagnosis not present

## 2021-08-29 DIAGNOSIS — Z9889 Other specified postprocedural states: Secondary | ICD-10-CM | POA: Diagnosis not present

## 2021-08-29 DIAGNOSIS — E785 Hyperlipidemia, unspecified: Secondary | ICD-10-CM | POA: Diagnosis not present

## 2021-08-29 DIAGNOSIS — I10 Essential (primary) hypertension: Secondary | ICD-10-CM | POA: Diagnosis not present

## 2021-09-01 DIAGNOSIS — I10 Essential (primary) hypertension: Secondary | ICD-10-CM | POA: Diagnosis not present

## 2021-09-01 DIAGNOSIS — N1832 Chronic kidney disease, stage 3b: Secondary | ICD-10-CM | POA: Diagnosis not present

## 2021-09-01 DIAGNOSIS — E1122 Type 2 diabetes mellitus with diabetic chronic kidney disease: Secondary | ICD-10-CM | POA: Diagnosis not present

## 2021-09-01 DIAGNOSIS — R8290 Unspecified abnormal findings in urine: Secondary | ICD-10-CM | POA: Diagnosis not present

## 2021-09-02 ENCOUNTER — Other Ambulatory Visit: Payer: Self-pay

## 2021-09-02 ENCOUNTER — Ambulatory Visit (INDEPENDENT_AMBULATORY_CARE_PROVIDER_SITE_OTHER): Payer: PPO | Admitting: Family Medicine

## 2021-09-02 ENCOUNTER — Encounter: Payer: Self-pay | Admitting: Family Medicine

## 2021-09-02 VITALS — BP 128/80 | HR 81 | Temp 98.0°F | Resp 16 | Ht 68.0 in | Wt 196.0 lb

## 2021-09-02 DIAGNOSIS — N1832 Chronic kidney disease, stage 3b: Secondary | ICD-10-CM

## 2021-09-02 DIAGNOSIS — I152 Hypertension secondary to endocrine disorders: Secondary | ICD-10-CM | POA: Diagnosis not present

## 2021-09-02 DIAGNOSIS — E785 Hyperlipidemia, unspecified: Secondary | ICD-10-CM

## 2021-09-02 DIAGNOSIS — E1159 Type 2 diabetes mellitus with other circulatory complications: Secondary | ICD-10-CM

## 2021-09-02 DIAGNOSIS — E1169 Type 2 diabetes mellitus with other specified complication: Secondary | ICD-10-CM | POA: Diagnosis not present

## 2021-09-02 DIAGNOSIS — M47816 Spondylosis without myelopathy or radiculopathy, lumbar region: Secondary | ICD-10-CM | POA: Diagnosis not present

## 2021-09-02 DIAGNOSIS — Z23 Encounter for immunization: Secondary | ICD-10-CM

## 2021-09-02 MED ORDER — HYDROCODONE-ACETAMINOPHEN 5-325 MG PO TABS: 1.0000 | ORAL_TABLET | Freq: Four times a day (QID) | ORAL | 0 refills | Status: DC | PRN

## 2021-09-02 MED ORDER — TRULICITY 4.5 MG/0.5ML ~~LOC~~ SOAJ: 4.5000 mg | SUBCUTANEOUS | 3 refills | Status: DC

## 2021-09-02 NOTE — Assessment & Plan Note (Signed)
Refilled pain meds x3 - no change, no early refills

## 2021-09-02 NOTE — Assessment & Plan Note (Signed)
Previously well controlled Continue statin and zetia and fenofibrates

## 2021-09-02 NOTE — Assessment & Plan Note (Signed)
Uncontrolled on recent A1c at [facility] of 8.1 Hyperglycemic related to recent prednisone use No changes to meds, but will be able to resume Trulicity that was on backorder Foot exam completed today

## 2021-09-02 NOTE — Assessment & Plan Note (Signed)
Now following with Nephrology Think recent GFR decline may be related to recent hyperglycemia from prednisone and may recover Reviewed recent labs

## 2021-09-02 NOTE — Assessment & Plan Note (Signed)
Well controlled Continue current medications Reviewed recent metabolic panel 

## 2021-09-02 NOTE — Progress Notes (Signed)
I,[name],acting as a scribe for Stephen Paganini, MD.,have documented all relevant documentation on the behalf of Stephen Paganini, MD,as directed by  Stephen Paganini, MD while in the presence of Stephen Paganini, MD.   Established patient visit   Patient: [name]   DOB: 1951-07-16   70 y.o. Male  MRN: [id number] Visit Date: 09/02/2021  Today's healthcare provider: Lavon Paganini, MD   Chief Complaint  Patient presents with   Follow-up   Diabetes   Hypertension   Subjective    HPI  Diabetes Mellitus Type II, follow-up  Lab Results  Component Value Date   HGBA1C 7.5 (A) 05/31/2021   HGBA1C 7.7 (H) 02/25/2021   HGBA1C 7.6 (A) 11/23/2020   Last seen for diabetes 3 months ago.  Management since then includes continuing the same treatment. He reports good compliance with treatment. He is not having side effects. none  Home blood sugar records: fasting range: 132  Episodes of hypoglycemia? No none   Current insulin regiment: n/a Most Recent Eye Exam: 04/15/21  Sugar has been high when on Steroids, but since stopping back in 130s range for home random blood sugars  --------------------------------------------------------------------------------------------------- Hypertension, follow-up  BP Readings from Last 3 Encounters:  09/02/21 128/80  06/21/21 115/76  05/31/21 126/84   Wt Readings from Last 3 Encounters:  09/02/21 196 lb (88.9 kg)  06/21/21 200 lb (90.7 kg)  05/31/21 198 lb 3.2 oz (89.9 kg)     He was last seen for hypertension 3 months ago.  BP at that visit was 126/84. Management since that visit includes; Continue statin and zetia and fenofibrates. He reports good compliance with treatment. He is not having side effects. none He is not exercising. He is adherent to low salt diet.   Outside blood pressures are not being checked.  He does not smoke.  Use of agents associated with hypertension: NSAIDS.    ---------------------------------------------------------------------------------------------------     Medications: Outpatient Medications Prior to Visit  Medication Sig   amLODipine (NORVASC) 5 MG tablet Take 1 tablet (5 mg total) by mouth daily.   aspirin 81 MG tablet Take 81 mg by mouth daily.    atorvastatin (LIPITOR) 80 MG tablet TAKE ONE (1) TABLET EACH DAY   blood glucose meter kit and supplies KIT Dispense based on patient and insurance preference. Check sugars once daily   Calcium Citrate-Vitamin D (CALCIUM + D PO) Take 1 tablet by mouth daily.   clopidogrel (PLAVIX) 75 MG tablet TAKE 1 TABLET(75 MG) BY MOUTH DAILY   cyclobenzaprine (FLEXERIL) 5 MG tablet Take 1 tablet (5 mg total) by mouth 3 (three) times daily as needed for muscle spasms.   empagliflozin (JARDIANCE) 25 MG TABS tablet Take 1 tablet (25 mg total) by mouth daily.   ezetimibe (ZETIA) 10 MG tablet Take 1 tablet (10 mg total) by mouth daily.   fenofibrate (TRICOR) 145 MG tablet TAKE 1 TABLET(145 MG) BY MOUTH DAILY   furosemide (LASIX) 20 MG tablet Take by mouth.   glucose blood (ONETOUCH VERIO) test strip TEST BLOOD GLUCOSE EVERY DAY   hydrochlorothiazide (HYDRODIURIL) 25 MG tablet TAKE 1 TABLET(25 MG) BY MOUTH DAILY   lisinopril (ZESTRIL) 20 MG tablet Take 1 tablet (20 mg total) by mouth daily.   meloxicam (MOBIC) 15 MG tablet Take 1 tablet (15 mg total) by mouth daily.   metFORMIN (GLUCOPHAGE) 1000 MG tablet TAKE 1 TABLET BY MOUTH TWICE DAILY WITH FOOD   metoprolol tartrate (LOPRESSOR) 50 MG tablet TAKE 1  TABLET(50 MG) BY MOUTH TWICE DAILY   pantoprazole (PROTONIX) 40 MG tablet TAKE 1 TABLET BY MOUTH EVERY DAY AS NEEDED FOR HEARTBURN OR ACID REFLUX   Potassium Gluconate 2.5 MEQ TABS Take by mouth daily.    [DISCONTINUED] HYDROcodone-acetaminophen (NORCO) 5-325 MG tablet Take 1-2 tablets by mouth every 6 (six) hours as needed for moderate pain.   [DISCONTINUED] HYDROcodone-acetaminophen (NORCO) 5-325 MG tablet  Take 1-2 tablets by mouth every 6 (six) hours as needed for moderate pain.   [DISCONTINUED] HYDROcodone-acetaminophen (NORCO) 5-325 MG tablet Take 1-2 tablets by mouth every 6 (six) hours as needed for moderate pain.   [DISCONTINUED] Semaglutide, 2 MG/DOSE, 8 MG/3ML SOPN Inject 2 mg as directed once a week.   No facility-administered medications prior to visit.    Review of Systems     Objective    BP 128/80 (BP Location: Left Arm, Patient Position: Sitting, Cuff Size: Large)   Pulse 81   Temp 98 F (36.7 C) (Temporal)   Resp 16   Ht $R'5\' 8"'hl$  (1.727 m)   Wt 196 lb (88.9 kg)   SpO2 94%   BMI 29.80 kg/m  {Show previous vital signs (optional):23777}  Physical Exam Vitals reviewed.  Constitutional:      General: He is not in acute distress.    Appearance: Normal appearance. He is not diaphoretic.  HENT:     Head: Normocephalic and atraumatic.  Eyes:     General: No scleral icterus.    Conjunctiva/sclera: Conjunctivae normal.  Cardiovascular:     Rate and Rhythm: Normal rate and regular rhythm.     Pulses: Normal pulses.     Heart sounds: Normal heart sounds. No murmur heard. Pulmonary:     Effort: Pulmonary effort is normal. No respiratory distress.     Breath sounds: Normal breath sounds. No wheezing or rhonchi.  Abdominal:     General: There is no distension.     Palpations: Abdomen is soft.     Tenderness: There is no abdominal tenderness.  Musculoskeletal:     Cervical back: Neck supple.     Right lower leg: No edema.     Left lower leg: No edema.  Lymphadenopathy:     Cervical: No cervical adenopathy.  Skin:    General: Skin is warm and dry.     Capillary Refill: Capillary refill takes less than 2 seconds.     Findings: No rash.  Neurological:     Mental Status: He is alert and oriented to person, place, and time.     Cranial Nerves: No cranial nerve deficit.  Psychiatric:        Mood and Affect: Mood normal.        Behavior: Behavior normal.     Diabetic  Foot Exam - Simple   Simple Foot Form Diabetic Foot exam was performed with the following findings: Yes 09/02/2021 12:18 PM  Visual Inspection No deformities, no ulcerations, no other skin breakdown bilaterally: Yes Sensation Testing Intact to touch and monofilament testing bilaterally: Yes Pulse Check Posterior Tibialis and Dorsalis pulse intact bilaterally: Yes Comments      No results found for any visits on 09/02/21.  Assessment & Plan     Problem List Items Addressed This Visit       Cardiovascular and Mediastinum   Hypertension associated with diabetes ([employer])    Well controlled Continue current medications Reviewed recent metabolic panel      Relevant Medications   Dulaglutide (TRULICITY) 4.5 BB/0.4UG SOPN  Endocrine   Hyperlipidemia associated with type 2 diabetes mellitus (HCC)    Previously well controlled Continue statin and zetia and fenofibrates      Relevant Medications   Dulaglutide (TRULICITY) 4.5 YW/9.0PP SOPN   T2DM (type 2 diabetes mellitus) ([place]) - Primary    Uncontrolled on recent A1c at [facility] of 8.1 Hyperglycemic related to recent prednisone use No changes to meds, but will be able to resume Trulicity that was on backorder Foot exam completed today      Relevant Medications   Dulaglutide (TRULICITY) 4.5 ND/5.8PR SOPN     Musculoskeletal and Integument   Spondylosis of lumbar region without myelopathy or radiculopathy    Refilled pain meds x3 - no change, no early refills      Relevant Medications   HYDROcodone-acetaminophen (NORCO) 5-325 MG tablet   HYDROcodone-acetaminophen (NORCO) 5-325 MG tablet   HYDROcodone-acetaminophen (NORCO) 5-325 MG tablet     Genitourinary   Stage 3b chronic kidney disease (CKD) ([place])    Now following with Nephrology Think recent GFR decline may be related to recent hyperglycemia from prednisone and may recover Reviewed recent labs      Other Visit Diagnoses     Need for influenza vaccination        Relevant Orders   Flu Vaccine QUAD High Dose(Fluad) (Completed)        Return in about 3 months (around 12/03/2021) for chronic disease f/u.      I, Stephen Paganini, MD, have reviewed all documentation for this visit. The documentation on 09/02/21 for the exam, diagnosis, procedures, and orders are all accurate and complete.   [facility] [place], Dionne Bucy, MD, MPH [facility]

## 2021-09-06 ENCOUNTER — Other Ambulatory Visit: Payer: Self-pay | Admitting: Nephrology

## 2021-09-06 DIAGNOSIS — R8290 Unspecified abnormal findings in urine: Secondary | ICD-10-CM

## 2021-09-06 DIAGNOSIS — N1832 Chronic kidney disease, stage 3b: Secondary | ICD-10-CM

## 2021-09-06 DIAGNOSIS — E1122 Type 2 diabetes mellitus with diabetic chronic kidney disease: Secondary | ICD-10-CM

## 2021-09-12 DIAGNOSIS — M1811 Unilateral primary osteoarthritis of first carpometacarpal joint, right hand: Secondary | ICD-10-CM | POA: Diagnosis not present

## 2021-09-12 DIAGNOSIS — I2510 Atherosclerotic heart disease of native coronary artery without angina pectoris: Secondary | ICD-10-CM | POA: Diagnosis not present

## 2021-09-12 DIAGNOSIS — M1812 Unilateral primary osteoarthritis of first carpometacarpal joint, left hand: Secondary | ICD-10-CM | POA: Diagnosis not present

## 2021-09-12 DIAGNOSIS — Z955 Presence of coronary angioplasty implant and graft: Secondary | ICD-10-CM | POA: Diagnosis not present

## 2021-09-12 DIAGNOSIS — G5603 Carpal tunnel syndrome, bilateral upper limbs: Secondary | ICD-10-CM | POA: Diagnosis not present

## 2021-09-14 ENCOUNTER — Ambulatory Visit
Admission: RE | Admit: 2021-09-14 | Discharge: 2021-09-14 | Disposition: A | Discharge status: Home / Self Care | Payer: PPO | Source: Ambulatory Visit | Attending: Nephrology | Admitting: Nephrology

## 2021-09-14 ENCOUNTER — Other Ambulatory Visit: Payer: Self-pay

## 2021-09-14 DIAGNOSIS — N281 Cyst of kidney, acquired: Secondary | ICD-10-CM | POA: Diagnosis not present

## 2021-09-14 DIAGNOSIS — R8290 Unspecified abnormal findings in urine: Secondary | ICD-10-CM | POA: Insufficient documentation

## 2021-09-14 DIAGNOSIS — N1832 Chronic kidney disease, stage 3b: Secondary | ICD-10-CM | POA: Insufficient documentation

## 2021-09-14 DIAGNOSIS — N3289 Other specified disorders of bladder: Secondary | ICD-10-CM | POA: Diagnosis not present

## 2021-09-14 DIAGNOSIS — E1122 Type 2 diabetes mellitus with diabetic chronic kidney disease: Secondary | ICD-10-CM | POA: Diagnosis not present

## 2021-09-14 DIAGNOSIS — N183 Chronic kidney disease, stage 3 unspecified: Secondary | ICD-10-CM | POA: Insufficient documentation

## 2021-09-14 DIAGNOSIS — N400 Enlarged prostate without lower urinary tract symptoms: Secondary | ICD-10-CM | POA: Diagnosis not present

## 2021-09-19 DIAGNOSIS — I2510 Atherosclerotic heart disease of native coronary artery without angina pectoris: Secondary | ICD-10-CM | POA: Diagnosis not present

## 2021-10-07 DIAGNOSIS — G5603 Carpal tunnel syndrome, bilateral upper limbs: Secondary | ICD-10-CM | POA: Diagnosis not present

## 2021-10-07 DIAGNOSIS — M1812 Unilateral primary osteoarthritis of first carpometacarpal joint, left hand: Secondary | ICD-10-CM | POA: Diagnosis not present

## 2021-10-07 DIAGNOSIS — M1811 Unilateral primary osteoarthritis of first carpometacarpal joint, right hand: Secondary | ICD-10-CM | POA: Diagnosis not present

## 2021-10-11 ENCOUNTER — Other Ambulatory Visit: Payer: Self-pay | Admitting: Orthopedic Surgery

## 2021-10-20 ENCOUNTER — Other Ambulatory Visit: Payer: Self-pay

## 2021-10-20 ENCOUNTER — Encounter
Admission: RE | Admit: 2021-10-20 | Discharge: 2021-10-20 | Disposition: A | Discharge status: Home / Self Care | Payer: PPO | Source: Ambulatory Visit | Attending: Orthopedic Surgery | Admitting: Orthopedic Surgery

## 2021-10-20 VITALS — Ht 68.0 in | Wt 198.0 lb

## 2021-10-20 DIAGNOSIS — Z01812 Encounter for preprocedural laboratory examination: Secondary | ICD-10-CM

## 2021-10-20 DIAGNOSIS — N1832 Chronic kidney disease, stage 3b: Secondary | ICD-10-CM

## 2021-10-20 DIAGNOSIS — E1159 Type 2 diabetes mellitus with other circulatory complications: Secondary | ICD-10-CM

## 2021-10-20 DIAGNOSIS — I2510 Atherosclerotic heart disease of native coronary artery without angina pectoris: Secondary | ICD-10-CM

## 2021-10-20 HISTORY — DX: Chronic kidney disease, stage 3b: N18.32

## 2021-10-20 NOTE — Patient Instructions (Addendum)
Your procedure is scheduled on: Tuesday, January 10 Report to the Registration Desk on the 1st floor of the [facility] To find out your arrival time, please call [phone] between 1PM - 3PM on: Monday, January 9  REMEMBER: Instructions that are not followed completely may result in serious medical risk, up to and including death; or upon the discretion of your surgeon and anesthesiologist your surgery may need to be rescheduled.  Do not eat food after midnight the night before surgery.  No gum chewing, lozengers or hard candies.  You may however, drink water up to 2 hours before you are scheduled to arrive for your surgery. Do not drink anything within 2 hours of your scheduled arrival time.  TAKE THESE MEDICATIONS THE MORNING OF SURGERY WITH A SIP OF WATER:  Amlodipine Atorvastatin Duloxetine (cymbalta) Ezetimibe (Zetia) Fenofibrate  Metoprolol Pantoprazole - (take one the night before and one on the morning of surgery - helps to prevent nausea after surgery.) Tamsulosin (flomax)  Stop metformin 2 days prior to surgery. Last day to take Metformin is Saturday, January 7. Resume AFTER surgery.  Stop jardiance 3 days prior to surgery. Last day to take Jardiance is Friday, January 6. Resume AFTER surgery.  Do NOT take any insulin on the morning of surgery.  Follow recommendations from Cardiologist, Pulmonologist or PCP regarding stopping Aspirin and Plavix. Usual time is stop 5 days prior to surgery. Resume AFTER surgery per surgeon instruction.  One week prior to surgery: starting January 3 Stop Anti-inflammatories (NSAIDS) such as Advil, Aleve, Ibuprofen, Motrin, Naproxen, Naprosyn and Aspirin based products such as Excedrin, Goodys Powder, BC Powder. Stop ANY OVER THE COUNTER supplements until after surgery. Stop vitamin D. You may however, continue to take Tylenol if needed for pain up until the day of surgery.  No Alcohol for 24 hours before or after surgery.  No  Smoking including e-cigarettes for 24 hours prior to surgery.  No chewable tobacco products for at least 6 hours prior to surgery.  No nicotine patches on the day of surgery.  On the morning of surgery brush your teeth with toothpaste and water, you may rinse your mouth with mouthwash if you wish. Do not swallow any toothpaste or mouthwash.  Use CHG Soap as directed on instruction sheet.  Do not wear jewelry.  Do not wear lotions, powders, or perfumes.   Do not shave body from the neck down 48 hours prior to surgery just in case you cut yourself which could leave a site for infection.  Also, freshly shaved skin may become irritated if using the CHG soap.  Contact lenses, hearing aids and dentures may not be worn into surgery.  Do not bring valuables to the hospital. [facility] is not responsible for any missing/lost belongings or valuables.   Notify your doctor if there is any change in your medical condition (cold, fever, infection).  Wear comfortable clothing (specific to your surgery type) to the hospital.  After surgery, you can help prevent lung complications by doing breathing exercises.  Take deep breaths and cough every 1-2 hours. Your doctor may order a device called an Incentive Spirometer to help you take deep breaths.  If you are being discharged the day of surgery, you will not be allowed to drive home. You will need a responsible adult (18 years or older) to drive you home and stay with you that night.   If you are taking public transportation, you will need to have a responsible adult (18  years or older) with you. Please confirm with your physician that it is acceptable to use public transportation.   Please call the Pre-admissions Testing Dept. at [phone] if you have any questions about these instructions.  Surgery Visitation Policy:  Patients undergoing a surgery or procedure may have one family member or support person with them as long as that person is  not COVID-19 positive or experiencing its symptoms.  That person may remain in the waiting area during the procedure and may rotate out with other people.

## 2021-10-21 ENCOUNTER — Encounter
Admission: RE | Admit: 2021-10-21 | Discharge: 2021-10-21 | Disposition: A | Discharge status: Home / Self Care | Payer: PPO | Source: Ambulatory Visit | Attending: Orthopedic Surgery | Admitting: Orthopedic Surgery

## 2021-10-21 DIAGNOSIS — E1122 Type 2 diabetes mellitus with diabetic chronic kidney disease: Secondary | ICD-10-CM | POA: Insufficient documentation

## 2021-10-21 DIAGNOSIS — Z01818 Encounter for other preprocedural examination: Secondary | ICD-10-CM | POA: Diagnosis not present

## 2021-10-21 DIAGNOSIS — N1832 Chronic kidney disease, stage 3b: Secondary | ICD-10-CM | POA: Insufficient documentation

## 2021-10-21 DIAGNOSIS — E1159 Type 2 diabetes mellitus with other circulatory complications: Secondary | ICD-10-CM | POA: Diagnosis not present

## 2021-10-21 DIAGNOSIS — Z01812 Encounter for preprocedural laboratory examination: Secondary | ICD-10-CM

## 2021-10-21 DIAGNOSIS — Z01810 Encounter for preprocedural cardiovascular examination: Secondary | ICD-10-CM | POA: Diagnosis not present

## 2021-10-21 DIAGNOSIS — I2510 Atherosclerotic heart disease of native coronary artery without angina pectoris: Secondary | ICD-10-CM | POA: Insufficient documentation

## 2021-10-21 DIAGNOSIS — I152 Hypertension secondary to endocrine disorders: Secondary | ICD-10-CM | POA: Insufficient documentation

## 2021-10-21 LAB — BASIC METABOLIC PANEL
Anion gap: 9 (ref 5–15)
BUN: 23 mg/dL (ref 8–23)
CO2: 29 mmol/L (ref 22–32)
Calcium: 9.4 mg/dL (ref 8.9–10.3)
Chloride: 100 mmol/L (ref 98–111)
Creatinine, Ser: 1.35 mg/dL — ABNORMAL HIGH (ref 0.61–1.24)
GFR, Estimated: 56 mL/min — ABNORMAL LOW (ref >60)
Glucose, Bld: 114 mg/dL — ABNORMAL HIGH (ref 70–99)
Potassium: 3.5 mmol/L (ref 3.5–5.1)
Sodium: 138 mmol/L (ref 135–145)

## 2021-10-21 LAB — CBC
HCT: 42.1 % (ref 39.0–52.0)
Hemoglobin: 14.1 g/dL (ref 13.0–17.0)
MCH: 29.1 pg (ref 26.0–34.0)
MCHC: 33.5 g/dL (ref 30.0–36.0)
MCV: 87 fL (ref 80.0–100.0)
Platelets: 243 10*3/uL (ref 150–400)
RBC: 4.84 MIL/uL (ref 4.22–5.81)
RDW: 15.2 % (ref 11.5–15.5)
WBC: 7.2 10*3/uL (ref 4.0–10.5)
nRBC: 0 % (ref 0.0–0.2)

## 2021-10-21 LAB — SURGICAL PCR SCREEN
MRSA, PCR: NEGATIVE
Staphylococcus aureus: NEGATIVE

## 2021-10-25 ENCOUNTER — Telehealth: Payer: Self-pay | Admitting: Urgent Care

## 2021-10-25 ENCOUNTER — Encounter: Payer: Self-pay | Admitting: Orthopedic Surgery

## 2021-10-25 NOTE — Progress Notes (Signed)
Perioperative Services  Pre-Admission/Anesthesia Testing Clinical Review  Date: 10/25/21  Patient Demographics:  Name: [name] DOB:   03/19/1951 MRN:   [id number]  Planned Surgical Procedure(s):    Case: [id number] Date/Time: 11/01/21 1210   Procedure: CARPOMETACARPAL ([place]) FUSION OF THUMB (Right: Thumb)   Anesthesia type: Choice   Pre-op diagnosis: Arthritis of carpometacarpal joint of right hand  M18.11   Location: [facility] OR ROOM 01 / [place] [facility]   Surgeons: Hessie Knows, MD   NOTE: Available PAT nursing documentation and vital signs have been reviewed. Clinical nursing staff has updated patient's PMH/PSHx, current medication list, and drug allergies/intolerances to ensure comprehensive history available to assist in medical decision making as it pertains to the aforementioned surgical procedure and anticipated anesthetic course. Extensive review of available clinical information performed. [place] PMH and PSHx updated with any diagnoses/procedures that  may have been inadvertently omitted during his intake with the pre-admission testing department's nursing staff.  Clinical Discussion:  [name] is a 71 y.o. male who is submitted for pre-surgical anesthesia review and clearance prior to him undergoing the above procedure. Patient is a Former Smoker (quit 04/2020). Pertinent PMH includes: CAD, HTN, HLD, T2DM, GERD (on daily PPI), CKD-III, OA, depression.  Patient is followed by cardiology Saralyn Pilar, MD). He was last seen in the cardiology clinic on 08/29/2021; notes reviewed.  At the time of his clinic visit, patient doing well overall from a cardiovascular perspective.  He denied any episodes of chest pain, shortness breath, PND, orthopnea, palpitations, vertiginous symptoms, or presyncope/syncope.  Patient with chronic peripheral edema for which he uses diuretic therapy; effective and symptom at baseline.  PMH significant for cardiovascular  diagnoses.  Patient underwent diagnostic left heart catheterization on 04/08/2008 revealing a mildly reduced left ventricular systolic function with an EF of 48%.  There was 75% stenosis of the mid RCA noted.  Subsequent PCI was performed placing a 3.5 x 18 mm Xience V DES x1 resulting in 0% residual stenosis and TIMI-3 flow.  Patient underwent a stress echocardiogram on 09/12/2021 revealing normal right ventricular systolic function with trivial to mild pan valvular regurgitation.  There was no evidence of a transvalvular gradient to suggest stenosis.  Study determined to be normal.  Patient remains on daily DAPT therapy (ASA + clopidogrel); compliant with therapy with no evidence or reports of GI bleeding.  Blood pressure well controlled 126/78 on currently prescribed diuretic and ACEi therapies.  Patient is on ezetimibe + fenofibrate + a statin for his HLD and further ASCVD prevention.  T2DM loosely controlled on currently prescribed regimen; last Hgb A1c was 8.1% when checked on 07/18/2021. Functional capacity, as defined by DASI, is documented as being >/= 4 METS.  No changes were made to his medication regimen.  Patient follow-up with outpatient cardiology in 6 months or sooner if needed.  [name] is scheduled for an elective RIGHT CARPOMETACARPAL ([place]) FUSION OF THUMB on 11/01/2021 with Dr. Hessie Knows, MD. Given patient's past medical history significant for cardiovascular diagnoses, presurgical cardiac clearance was sought by the PAT team. Per cardiology, "this patient is optimized for surgery and may proceed with the planned procedural course with a LOW risk of significant perioperative cardiovascular complications".  Again patient is on daily DAPT therapy.  He has been instructed on recommendations from cardiology for holding both his ASA and clopidogrel for 5 days prior to his procedure with plans to restart as soon as postoperative bleeding risk felt to be minimized  by his primary  attending surgeon.  Patient is aware that his last dose of that medication should be on 10/26/2021.  Patient denies previous perioperative complications with anesthesia in the past. In review of the available records, it is noted that patient underwent a general anesthetic course here (ASA III) in 02/2018 without documented complications.   Vitals with BMI 10/20/2021 09/02/2021 06/21/2021  Height _0  _1  -  Weight 198 lbs 196 lbs 200 lbs  BMI 40.97 35.32 -  Systolic - 992 426  Diastolic - 80 76  Pulse - 81 86    Providers/Specialists:   NOTE: Primary physician provider listed below. Patient may have been seen by APP or partner within same practice.   PROVIDER ROLE / SPECIALTY LAST Fabio Bering, MD Orthopedic Surgeon 10/07/2021  Virginia Crews, MD Primary Care Provider 09/02/2021  Isaias Cowman, MD Cardiology 08/29/2021  Murlean Iba, MD Nephrology 09/01/2021   Allergies:  Amoxicillin and Fexofenadine  Current Home Medications:   No current facility-administered medications for this encounter.    acetaminophen (TYLENOL) 500 MG tablet   amLODipine (NORVASC) 5 MG tablet   aspirin 81 MG tablet   atorvastatin (LIPITOR) 80 MG tablet   Cholecalciferol (VITAMIN D3 PO)   clopidogrel (PLAVIX) 75 MG tablet   Dulaglutide (TRULICITY) 4.5 ST/4.1DQ SOPN   DULoxetine (CYMBALTA) 20 MG capsule   empagliflozin (JARDIANCE) 25 MG TABS tablet   ezetimibe (ZETIA) 10 MG tablet   fenofibrate (TRICOR) 145 MG tablet   furosemide (LASIX) 20 MG tablet   hydrochlorothiazide (HYDRODIURIL) 25 MG tablet   HYDROcodone-acetaminophen (NORCO) 5-325 MG tablet   Lidocaine 4 % PTCH   Liniments (BEN GAY EX)   lisinopril (ZESTRIL) 20 MG tablet   metFORMIN (GLUCOPHAGE) 1000 MG tablet   metoprolol tartrate (LOPRESSOR) 50 MG tablet   pantoprazole (PROTONIX) 40 MG tablet   sitaGLIPtin (JANUVIA) 100 MG tablet   tamsulosin (FLOMAX) 0.4 MG CAPS capsule   blood glucose meter kit and supplies  KIT   cyclobenzaprine (FLEXERIL) 5 MG tablet   glucose blood (ONETOUCH VERIO) test strip   meloxicam (MOBIC) 15 MG tablet   History:   Past Medical History:  Diagnosis Date   Arthritis    back   Cataracts, bilateral    a.) s/p extractions in 2018   Coronary artery disease 04/08/2008   a.)  LHC 04/08/2008: EF 48%; 75% stenosis mRCA --> PCI performed placing a 3.5 x 18 mm Xience V DES x1.   Depression    GERD (gastroesophageal reflux disease)    Glaucoma    Hyperlipidemia    Hypertension    Stage 3b chronic kidney disease (HCC)    T2DM (type 2 diabetes mellitus) ([place])    Past Surgical History:  Procedure Laterality Date   CAROTID STENT     CATARACT EXTRACTION W/PHACO Right 03/01/2017   Procedure: CATARACT EXTRACTION PHACO AND INTRAOCULAR LENS PLACEMENT ([place]);  Surgeon: Eulogio Bear, MD;  Location: [facility] ORS;  Service: Ophthalmology;  Laterality: Right;  [place] 22.6 AP% 6.1 CDE 1.37 Fluid pack lot # [id number]   CATARACT EXTRACTION W/PHACO Left 05/01/2017   Procedure: CATARACT EXTRACTION PHACO AND INTRAOCULAR LENS PLACEMENT ([facility])  Left Diabetic;  Surgeon: Eulogio Bear, MD;  Location: [place];  Service: Ophthalmology;  Laterality: Left;  Block Diabetic   CORONARY ANGIOPLASTY WITH STENT PLACEMENT Left 04/08/2008   Procedure: CORONARY ANGIOPLASTY WITH STENT PLACEMENT (3.5 x 18 mm Xience V DES x1 to mRCA); Location: [place]; Surgeon: Jonetta Speak,  MD   POSTERIOR CERVICAL LAMINECTOMY N/A 02/25/2018   Procedure: POSTERIOR CERVICAL LAMINECTOMY-CERVICAL 3;  Surgeon: Kathlene November, MD;  Location: [facility] ORS;  Service: Neurosurgery;  Laterality: N/A;   Family History  Problem Relation Age of Onset   Diabetes Mother        mellitus type2   Social History   Tobacco Use   Smoking status: Former    Types: Cigars    Quit date: 05/06/2020    Years since quitting: 1.4   Smokeless tobacco: Never   Tobacco comments:    smokes cigars-about 10-15 cigars a week, quit  cigarettes 20+ years ago  Vaping Use   Vaping Use: Never used  Substance Use Topics   Alcohol use: No   Drug use: No    Pertinent Clinical Results:  LABS: Labs reviewed: Acceptable for surgery.  No visits with results within 3 Day(s) from this visit.  Latest known visit with results is:  Hospital Outpatient Visit on 10/21/2021  Component Date Value Ref Range Status   MRSA, PCR 10/21/2021 NEGATIVE  NEGATIVE Final   Staphylococcus aureus 10/21/2021 NEGATIVE  NEGATIVE Final   Comment: (NOTE) The Xpert SA Assay (FDA approved for NASAL specimens in patients 41 years of age and older), is one component of a comprehensive surveillance program. It is not intended to diagnose infection nor to guide or monitor treatment. Performed at [facility], [place], [place] [address]    Sodium 10/21/2021 138  135 - 145 mmol/L Final   Potassium 10/21/2021 3.5  3.5 - 5.1 mmol/L Final   Chloride 10/21/2021 100  98 - 111 mmol/L Final   CO2 10/21/2021 29  22 - 32 mmol/L Final   Glucose, Bld 10/21/2021 114 (H)  70 - 99 mg/dL Final   Glucose reference range applies only to samples taken after fasting for at least 8 hours.   BUN 10/21/2021 23  8 - 23 mg/dL Final   Creatinine, Ser 10/21/2021 1.35 (H)  0.61 - 1.24 mg/dL Final   Calcium 10/21/2021 9.4  8.9 - 10.3 mg/dL Final   GFR, Estimated 10/21/2021 56 (L)  >60 mL/min Final   Comment: (NOTE) Calculated using the CKD-EPI Creatinine Equation (2021)    Anion gap 10/21/2021 9  5 - 15 Final   Performed at [facility], [address] [place], [place] [address]   WBC 10/21/2021 7.2  4.0 - 10.5 K/uL Final   RBC 10/21/2021 4.84  4.22 - 5.81 MIL/uL Final   Hemoglobin 10/21/2021 14.1  13.0 - 17.0 g/dL Final   HCT 10/21/2021 42.1  39.0 - 52.0 % Final   MCV 10/21/2021 87.0  80.0 - 100.0 fL Final   MCH 10/21/2021 29.1  26.0 - 34.0 pg Final   MCHC 10/21/2021 33.5  30.0 - 36.0 g/dL Final   RDW 10/21/2021 15.2  11.5 - 15.5 %  Final   Platelets 10/21/2021 243  150 - 400 K/uL Final   nRBC 10/21/2021 0.0  0.0 - 0.2 % Final   Performed at [facility], [address] [place], [place] [address]    ECG: Date: 10/21/2021 Time ECG obtained: 0817 AM Rate: 76 bpm Rhythm: normal sinus Axis (leads I and aVF): Normal Intervals: PR 176 ms. QRS 98 ms. QTc 452 ms. ST segment and T wave changes: No evidence of acute ST segment elevation or depression Comparison: Similar to previous tracing obtained on 02/21/2018   IMAGING / PROCEDURES: STRESS ECHOCARDIOGRAM performed on 09/12/2021 LVEF >55% Normal  right ventricular systolic function Trivial AR and PR Mild MR and TR No valvular stenosis No pericardial effusion  DIAGNOSTIC RADIOGRAPHS OF BILATERAL HANDS performed on 09/12/2021 Severe RIGHT thumb CMC degenerative changes, subluxation, osteophyte, and subchondral cyst formation on both sides of the joint. The LEFT has subluxation with minimal degenerative changes Subluxation (L >R)  Impression and Plan:  [name] has been referred for pre-anesthesia review and clearance prior to him undergoing the planned anesthetic and procedural courses. Available labs, pertinent testing, and imaging results were personally reviewed by me. This patient has been appropriately cleared by cardiology with an overall LOW risk of significant perioperative cardiovascular complications.  Based on clinical review performed today (10/25/21), barring any significant acute changes in the patient's overall condition, it is anticipated that he will be able to proceed with the planned surgical intervention. Any acute changes in clinical condition may necessitate his procedure being postponed and/or cancelled. Patient will meet with anesthesia team (MD and/or CRNA) on the day of his procedure for preoperative evaluation/assessment. Questions regarding anesthetic course will be fielded at that time.   Pre-surgical instructions were  reviewed with the patient during his PAT appointment and questions were fielded by PAT clinical staff. Patient was advised that if any questions or concerns arise prior to his procedure then he should return a call to PAT and/or his surgeon's office to discuss.  Honor Loh, MSN, APRN, FNP-C, CEN [facility]  Peri-operative Services Nurse Practitioner Phone: [phone] Fax: [phone] 10/25/21 1:52 PM  NOTE: This note has been prepared using Dragon dictation software. Despite my best ability to proofread, there is always the potential that unintentional transcriptional errors may still occur from this process.

## 2021-10-25 NOTE — Progress Notes (Signed)
Erroneous encounter - disregard.  Honor Loh, MSN, APRN, FNP-C, CEN [facility]  Peri-operative Services Nurse Practitioner Phone: [phone] 10/25/21 2:26 PM

## 2021-10-26 ENCOUNTER — Other Ambulatory Visit: Payer: Self-pay | Admitting: Family Medicine

## 2021-10-26 NOTE — Telephone Encounter (Signed)
Spoke with [employer] patient's daughter (DPR). Per daughter patient was instructed to stop Metformin 2 days prior to surgery and Jardiance 3 days prior and any Apirin/plavix 5 days to a week prior to surgery. She was a little confused with the one's that he was told to take in the morning the day of surgery which is Metoprolol but that he always take his Metoprolol and Metformin in the morning and they asked him not to take the Metformin but yes to take the Metoprolol. Told patient to go ahead and follow the preadmission recommendations but that I will let the provider know.   She also stated patient will be due for refill on his pain med 01/09 and if possible to send that to the pharmacy so she doesn't have to call again on Monday.

## 2021-10-26 NOTE — Telephone Encounter (Signed)
Pt is having surgery on Tuesday and he was given instructions to come off of medications/ pts daughter has question about two of the medications and the instructions / please advise asap/ pt is to start coming off of medication this week   Pt also needs a refill for HYDROcodone-acetaminophen (NORCO) 5-325 MG tablet

## 2021-10-27 MED ORDER — HYDROCODONE-ACETAMINOPHEN 5-325 MG PO TABS: 1.0000 | ORAL_TABLET | Freq: Four times a day (QID) | ORAL | 0 refills | Status: DC | PRN

## 2021-11-01 ENCOUNTER — Ambulatory Visit
Admission: RE | Admit: 2021-11-01 | Discharge: 2021-11-01 | Disposition: A | Discharge status: Home / Self Care | Payer: No Typology Code available for payment source | Attending: Orthopedic Surgery | Admitting: Orthopedic Surgery

## 2021-11-01 ENCOUNTER — Telehealth: Payer: Self-pay | Admitting: Family Medicine

## 2021-11-01 ENCOUNTER — Ambulatory Visit: Payer: No Typology Code available for payment source | Admitting: Urgent Care

## 2021-11-01 ENCOUNTER — Ambulatory Visit: Payer: No Typology Code available for payment source

## 2021-11-01 ENCOUNTER — Other Ambulatory Visit: Payer: Self-pay

## 2021-11-01 ENCOUNTER — Encounter: Payer: Self-pay | Admitting: Orthopedic Surgery

## 2021-11-01 ENCOUNTER — Encounter
Admission: RE | Disposition: A | Discharge status: Home / Self Care | Payer: Self-pay | Source: Home / Self Care | Attending: Orthopedic Surgery

## 2021-11-01 ENCOUNTER — Other Ambulatory Visit: Payer: Self-pay | Admitting: Family Medicine

## 2021-11-01 DIAGNOSIS — I2510 Atherosclerotic heart disease of native coronary artery without angina pectoris: Secondary | ICD-10-CM

## 2021-11-01 DIAGNOSIS — M25541 Pain in joints of right hand: Secondary | ICD-10-CM | POA: Diagnosis not present

## 2021-11-01 DIAGNOSIS — I129 Hypertensive chronic kidney disease with stage 1 through stage 4 chronic kidney disease, or unspecified chronic kidney disease: Secondary | ICD-10-CM | POA: Diagnosis not present

## 2021-11-01 DIAGNOSIS — E1122 Type 2 diabetes mellitus with diabetic chronic kidney disease: Secondary | ICD-10-CM | POA: Diagnosis not present

## 2021-11-01 DIAGNOSIS — E1169 Type 2 diabetes mellitus with other specified complication: Secondary | ICD-10-CM

## 2021-11-01 DIAGNOSIS — E785 Hyperlipidemia, unspecified: Secondary | ICD-10-CM | POA: Insufficient documentation

## 2021-11-01 DIAGNOSIS — Z9889 Other specified postprocedural states: Secondary | ICD-10-CM | POA: Diagnosis not present

## 2021-11-01 DIAGNOSIS — R52 Pain, unspecified: Secondary | ICD-10-CM

## 2021-11-01 DIAGNOSIS — K219 Gastro-esophageal reflux disease without esophagitis: Secondary | ICD-10-CM

## 2021-11-01 DIAGNOSIS — Z79899 Other long term (current) drug therapy: Secondary | ICD-10-CM | POA: Diagnosis not present

## 2021-11-01 DIAGNOSIS — Z7982 Long term (current) use of aspirin: Secondary | ICD-10-CM | POA: Diagnosis not present

## 2021-11-01 DIAGNOSIS — G8918 Other acute postprocedural pain: Secondary | ICD-10-CM | POA: Diagnosis not present

## 2021-11-01 DIAGNOSIS — F32A Depression, unspecified: Secondary | ICD-10-CM | POA: Insufficient documentation

## 2021-11-01 DIAGNOSIS — I152 Hypertension secondary to endocrine disorders: Secondary | ICD-10-CM

## 2021-11-01 DIAGNOSIS — N1832 Chronic kidney disease, stage 3b: Secondary | ICD-10-CM | POA: Insufficient documentation

## 2021-11-01 DIAGNOSIS — Z87891 Personal history of nicotine dependence: Secondary | ICD-10-CM | POA: Insufficient documentation

## 2021-11-01 DIAGNOSIS — G5603 Carpal tunnel syndrome, bilateral upper limbs: Secondary | ICD-10-CM | POA: Diagnosis not present

## 2021-11-01 DIAGNOSIS — M1811 Unilateral primary osteoarthritis of first carpometacarpal joint, right hand: Secondary | ICD-10-CM | POA: Insufficient documentation

## 2021-11-01 DIAGNOSIS — E1159 Type 2 diabetes mellitus with other circulatory complications: Secondary | ICD-10-CM

## 2021-11-01 DIAGNOSIS — E1165 Type 2 diabetes mellitus with hyperglycemia: Secondary | ICD-10-CM

## 2021-11-01 HISTORY — PX: CARPOMETACARPAL (CMC) FUSION OF THUMB: SHX6290

## 2021-11-01 HISTORY — DX: Unspecified glaucoma: H40.9

## 2021-11-01 HISTORY — DX: Type 2 diabetes mellitus without complications: E11.9

## 2021-11-01 HISTORY — DX: Unspecified cataract: H26.9

## 2021-11-01 LAB — GLUCOSE, CAPILLARY
Glucose-Capillary: 134 mg/dL — ABNORMAL HIGH (ref 70–99)
Glucose-Capillary: 172 mg/dL — ABNORMAL HIGH (ref 70–99)

## 2021-11-01 SURGERY — CARPOMETACARPAL (CMC) FUSION OF THUMB
Anesthesia: General | Site: Thumb | Laterality: Right

## 2021-11-01 MED ORDER — SODIUM CHLORIDE 0.9 % IR SOLN
Status: DC | PRN
  Administered 2021-11-01: 502 mL

## 2021-11-01 MED ORDER — PHENYLEPHRINE 40 MCG/ML (10ML) SYRINGE FOR IV PUSH (FOR BLOOD PRESSURE SUPPORT)
PREFILLED_SYRINGE | INTRAVENOUS | Status: DC | PRN
  Administered 2021-11-01 (×5): 160 ug via INTRAVENOUS

## 2021-11-01 MED ORDER — LIDOCAINE HCL (CARDIAC) PF 100 MG/5ML IV SOSY
PREFILLED_SYRINGE | INTRAVENOUS | Status: DC | PRN
  Administered 2021-11-01: 40 mg via INTRAVENOUS

## 2021-11-01 MED ORDER — LACTATED RINGERS IV SOLN: INTRAVENOUS | Status: DC | PRN

## 2021-11-01 MED ORDER — CHLORHEXIDINE GLUCONATE 0.12 % MT SOLN
OROMUCOSAL | Status: AC
  Administered 2021-11-01: 15 mL via OROMUCOSAL
  Filled 2021-11-01: qty 15

## 2021-11-01 MED ORDER — ORAL CARE MOUTH RINSE: 15.0000 mL | Freq: Once | OROMUCOSAL | Status: AC

## 2021-11-01 MED ORDER — HYDROCODONE-ACETAMINOPHEN 5-325 MG PO TABS: 1.0000 | ORAL_TABLET | ORAL | 0 refills | Status: DC | PRN

## 2021-11-01 MED ORDER — PROPOFOL 10 MG/ML IV BOLUS
INTRAVENOUS | Status: AC
  Filled 2021-11-01: qty 20

## 2021-11-01 MED ORDER — CEFAZOLIN SODIUM-DEXTROSE 2-4 GM/100ML-% IV SOLN
2.0000 g | INTRAVENOUS | Status: AC
  Administered 2021-11-01: 2 g via INTRAVENOUS

## 2021-11-01 MED ORDER — SODIUM CHLORIDE 0.9 % IV SOLN: INTRAVENOUS | Status: DC

## 2021-11-01 MED ORDER — EPHEDRINE SULFATE 50 MG/ML IJ SOLN
INTRAMUSCULAR | Status: DC | PRN
  Administered 2021-11-01 (×2): 10 mg via INTRAVENOUS
  Administered 2021-11-01: 5 mg via INTRAVENOUS

## 2021-11-01 MED ORDER — FENTANYL CITRATE (PF) 100 MCG/2ML IJ SOLN
INTRAMUSCULAR | Status: AC
  Filled 2021-11-01: qty 2

## 2021-11-01 MED ORDER — PROPOFOL 10 MG/ML IV BOLUS
INTRAVENOUS | Status: DC | PRN
Start: 2021-11-01 — End: 2021-11-01
  Administered 2021-11-01: 120 mg via INTRAVENOUS
  Administered 2021-11-01: 40 mg via INTRAVENOUS

## 2021-11-01 MED ORDER — VASOPRESSIN 20 UNIT/ML IV SOLN
INTRAVENOUS | Status: AC
  Filled 2021-11-01: qty 1

## 2021-11-01 MED ORDER — BUPIVACAINE HCL (PF) 0.5 % IJ SOLN
INTRAMUSCULAR | Status: AC
  Filled 2021-11-01: qty 30

## 2021-11-01 MED ORDER — MIDAZOLAM HCL 2 MG/2ML IJ SOLN
INTRAMUSCULAR | Status: DC | PRN
  Administered 2021-11-01: 2 mg via INTRAVENOUS

## 2021-11-01 MED ORDER — LIDOCAINE HCL (PF) 1 % IJ SOLN
INTRAMUSCULAR | Status: DC | PRN
  Administered 2021-11-01: 1 mL via SUBCUTANEOUS

## 2021-11-01 MED ORDER — GLYCOPYRROLATE 0.2 MG/ML IJ SOLN
INTRAMUSCULAR | Status: DC | PRN
Start: 2021-11-01 — End: 2021-11-01
  Administered 2021-11-01: .2 mg via INTRAVENOUS

## 2021-11-01 MED ORDER — NEOMYCIN-POLYMYXIN B GU 40-200000 IR SOLN
Status: AC
  Filled 2021-11-01: qty 2

## 2021-11-01 MED ORDER — FENOFIBRATE 145 MG PO TABS: ORAL_TABLET | ORAL | 1 refills | Status: DC

## 2021-11-01 MED ORDER — ONDANSETRON HCL 4 MG/2ML IJ SOLN: 4.0000 mg | Freq: Four times a day (QID) | INTRAMUSCULAR | Status: DC | PRN

## 2021-11-01 MED ORDER — METOCLOPRAMIDE HCL 10 MG PO TABS: 5.0000 mg | ORAL_TABLET | Freq: Three times a day (TID) | ORAL | Status: DC | PRN

## 2021-11-01 MED ORDER — LIDOCAINE HCL (PF) 1 % IJ SOLN
INTRAMUSCULAR | Status: AC
  Filled 2021-11-01: qty 5

## 2021-11-01 MED ORDER — CHLORHEXIDINE GLUCONATE 0.12 % MT SOLN: 15.0000 mL | Freq: Once | OROMUCOSAL | Status: AC

## 2021-11-01 MED ORDER — BUPIVACAINE HCL (PF) 0.5 % IJ SOLN
INTRAMUSCULAR | Status: DC | PRN
  Administered 2021-11-01: 15 mL

## 2021-11-01 MED ORDER — ONDANSETRON HCL 4 MG/2ML IJ SOLN
INTRAMUSCULAR | Status: DC | PRN
Start: 2021-11-01 — End: 2021-11-01
  Administered 2021-11-01: 4 mg via INTRAVENOUS

## 2021-11-01 MED ORDER — GELATIN ABSORBABLE 12-7 MM EX MISC
CUTANEOUS | Status: DC | PRN
  Administered 2021-11-01: 1

## 2021-11-01 MED ORDER — VASOPRESSIN 20 UNIT/ML IV SOLN
INTRAVENOUS | Status: DC | PRN
  Administered 2021-11-01: 2 [IU] via INTRAVENOUS

## 2021-11-01 MED ORDER — GELATIN ABSORBABLE 12-7 MM EX MISC
CUTANEOUS | Status: AC
  Filled 2021-11-01: qty 1

## 2021-11-01 MED ORDER — BUPIVACAINE HCL (PF) 0.5 % IJ SOLN
INTRAMUSCULAR | Status: DC | PRN
  Administered 2021-11-01: 50 mg via PERINEURAL

## 2021-11-01 MED ORDER — METOCLOPRAMIDE HCL 5 MG/ML IJ SOLN: 5.0000 mg | Freq: Three times a day (TID) | INTRAMUSCULAR | Status: DC | PRN

## 2021-11-01 MED ORDER — FENTANYL CITRATE (PF) 100 MCG/2ML IJ SOLN
INTRAMUSCULAR | Status: DC | PRN
  Administered 2021-11-01: 50 ug via INTRAVENOUS

## 2021-11-01 MED ORDER — MIDAZOLAM HCL 2 MG/2ML IJ SOLN
INTRAMUSCULAR | Status: AC
  Filled 2021-11-01: qty 2

## 2021-11-01 MED ORDER — BUPIVACAINE HCL (PF) 0.5 % IJ SOLN
INTRAMUSCULAR | Status: AC
  Filled 2021-11-01: qty 10

## 2021-11-01 MED ORDER — ATORVASTATIN CALCIUM 80 MG PO TABS: ORAL_TABLET | ORAL | 1 refills | Status: DC

## 2021-11-01 MED ORDER — ONDANSETRON HCL 4 MG PO TABS: 4.0000 mg | ORAL_TABLET | Freq: Four times a day (QID) | ORAL | Status: DC | PRN

## 2021-11-01 MED ORDER — CEFAZOLIN SODIUM-DEXTROSE 2-4 GM/100ML-% IV SOLN
INTRAVENOUS | Status: AC
  Filled 2021-11-01: qty 100

## 2021-11-01 MED ORDER — GLYCOPYRROLATE 0.2 MG/ML IJ SOLN
INTRAMUSCULAR | Status: AC
  Filled 2021-11-01: qty 1

## 2021-11-01 SURGICAL SUPPLY — 35 items
APL PRP STRL LF DISP 70% ISPRP (MISCELLANEOUS) ×1
BLADE OSC/SAGITTAL 5.5X25 (BLADE) ×2 IMPLANT
BNDG CMPR STD VLCR NS LF 5.8X3 (GAUZE/BANDAGES/DRESSINGS) ×1
BNDG CONFORM 3 STRL LF (GAUZE/BANDAGES/DRESSINGS) ×2 IMPLANT
BNDG ELASTIC 3X5.8 VLCR NS LF (GAUZE/BANDAGES/DRESSINGS) ×2 IMPLANT
CAST PADDING 3X4FT ST 30246 (SOFTGOODS) ×1
CHLORAPREP W/TINT 26 (MISCELLANEOUS) ×2 IMPLANT
DRAPE FLUOR MINI C-ARM 54X84 (DRAPES) ×2 IMPLANT
ELECT CAUTERY BLADE 6.4 (BLADE) ×2 IMPLANT
GAUZE 4X4 16PLY ~~LOC~~+RFID DBL (SPONGE) ×2 IMPLANT
GAUZE SPONGE 4X4 12PLY STRL (GAUZE/BANDAGES/DRESSINGS) ×2 IMPLANT
GAUZE XEROFORM 1X8 LF (GAUZE/BANDAGES/DRESSINGS) ×2 IMPLANT
GLOVE SURG SYN 9.0  PF PI (GLOVE) ×1
GLOVE SURG SYN 9.0 PF PI (GLOVE) ×1 IMPLANT
GOWN SRG 2XL LVL 4 RGLN SLV (GOWNS) ×1 IMPLANT
GOWN STRL NON-REIN 2XL LVL4 (GOWNS) ×2
GOWN STRL REUS W/ TWL LRG LVL3 (GOWN DISPOSABLE) ×1 IMPLANT
GOWN STRL REUS W/TWL LRG LVL3 (GOWN DISPOSABLE) ×2
KIT TURNOVER KIT A (KITS) ×2 IMPLANT
MANIFOLD NEPTUNE II (INSTRUMENTS) ×2 IMPLANT
NS IRRIG 500ML POUR BTL (IV SOLUTION) ×2 IMPLANT
PACK EXTREMITY ARMC (MISCELLANEOUS) ×2 IMPLANT
PAD CAST CTTN 3X4 STRL (SOFTGOODS) ×1 IMPLANT
PADDING CAST COTTON 3X4 STRL (SOFTGOODS) ×1
SCALPEL PROTECTED #15 DISP (BLADE) ×4 IMPLANT
SPLINT CAST 1 STEP 3X12 (MISCELLANEOUS) ×2 IMPLANT
SPLINT WRIST M LT TX990308 (SOFTGOODS) ×2 IMPLANT
SUT ETHILON 4-0 (SUTURE) ×2
SUT ETHILON 4-0 FS2 18XMFL BLK (SUTURE) ×1
SUT VIC AB 0 CT2 27 (SUTURE) ×4 IMPLANT
SUT VIC AB 3-0 SH 27 (SUTURE) ×2
SUT VIC AB 3-0 SH 27X BRD (SUTURE) ×1 IMPLANT
SUTURE ETHLN 4-0 FS2 18XMF BLK (SUTURE) ×1 IMPLANT
SYSTEM IMPLANT TIGHTROPE MINI (Anchor) ×2 IMPLANT
WIRE Z .062 C-WIRE SPADE TIP (WIRE) ×4 IMPLANT

## 2021-11-01 NOTE — Transfer of Care (Signed)
Immediate Anesthesia Transfer of Care Note  Patient: [name]  Procedure(s) Performed: CARPOMETACARPAL ([place]) FUSION OF THUMB (Right: Thumb)  Patient Location: PACU  Anesthesia Type:General  Level of Consciousness: sedated  Airway & Oxygen Therapy: Patient Spontanous Breathing and Patient connected to face mask oxygen  Post-op Assessment: Report given to RN and Post -op Vital signs reviewed and stable  Post vital signs: Reviewed and stable  Last Vitals:  Vitals Value Taken Time  BP 108/72 11/01/21 1521  Temp    Pulse 84 11/01/21 1522  Resp 19 11/01/21 1522  SpO2 100 % 11/01/21 1522  Vitals shown include unvalidated device data.  Last Pain:  Vitals:   11/01/21 1234  TempSrc: Temporal  PainSc: 0-No pain         Complications: No notable events documented.

## 2021-11-01 NOTE — Anesthesia Procedure Notes (Addendum)
Procedure Name: LMA Insertion Date/Time: 11/01/2021 2:06 PM Performed by: Iran Planas, CRNA Pre-anesthesia Checklist: Patient identified, Patient being monitored, Timeout performed, Emergency Drugs available and Suction available Patient Re-evaluated:Patient Re-evaluated prior to induction Oxygen Delivery Method: Circle system utilized Preoxygenation: Pre-oxygenation with 100% oxygen Induction Type: IV induction Ventilation: Mask ventilation without difficulty LMA: LMA inserted LMA Size: 5.0 Tube type: Oral Number of attempts: 1 Placement Confirmation: positive ETCO2 and breath sounds checked- equal and bilateral Tube secured with: Tape Dental Injury: Teeth and Oropharynx as per pre-operative assessment

## 2021-11-01 NOTE — Anesthesia Preprocedure Evaluation (Signed)
Anesthesia Evaluation  Patient identified by MRN, date of birth, ID band Patient awake    Reviewed: Allergy & Precautions, H&P , NPO status , reviewed documented beta blocker date and time   Airway Mallampati: I  TM Distance: >3 FB Neck ROM: full    Dental  (+) Teeth Intact   Pulmonary Current Smoker and Patient abstained from smoking., former smoker,    Pulmonary exam normal        Cardiovascular hypertension, Pt. 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  Arteriosclerosis of coronary artery  S/P coronary artery stent placement  Drug eluting stent 2009             CAD in native artery  Gastro-esophageal reflux disease without esophagitis  Controlled type 2 diabetes mellitus without complication Spondylosis of lumbar region without myelopathy or radiculopathy    Reproductive/Obstetrics                             Anesthesia Physical  Anesthesia Plan  ASA: III  Anesthesia Plan:    Post-op Pain Management:    Induction: Intravenous  PONV Risk Score and Plan:   Airway Management Planned:   Additional Equipment:   Intra-op Plan:   Post-operative Plan: Extubation in OR  Informed Consent:   Plan Discussed with: CRNA and Surgeon  Anesthesia Plan Comments: (P)        Anesthesia Quick Evaluation

## 2021-11-01 NOTE — Telephone Encounter (Signed)
Requested Prescriptions  Pending Prescriptions Disp Refills   JARDIANCE 25 MG TABS tablet [Pharmacy Med Name: JARDIANCE 25MG  TABLETS] 90 tablet 0    Sig: TAKE 1 TABLET(25 MG) BY MOUTH DAILY     Endocrinology:  Diabetes - SGLT2 Inhibitors Failed - 11/01/2021  7:13 AM      Failed - Cr in normal range and within 360 days    Creatinine, Ser  Date Value Ref Range Status  10/21/2021 1.35 (H) 0.61 - 1.24 mg/dL Final         Failed - eGFR in normal range and within 360 days    GFR calc Af Amer  Date Value Ref Range Status  08/09/2020 82 >59 mL/min/1.73 Final    Comment:    **In accordance with recommendations from the NKF-ASN Task force,**   [facility] is in the process of updating its eGFR calculation to the   2021 CKD-EPI creatinine equation that estimates kidney function   without a race variable.    GFR, Estimated  Date Value Ref Range Status  10/21/2021 56 (L) >60 mL/min Final    Comment:    (NOTE) Calculated using the CKD-EPI Creatinine Equation (2021)    eGFR  Date Value Ref Range Status  04/15/2021 59 (L) >59 mL/min/1.73 Final         Passed - LDL in normal range and within 360 days    LDL Chol Calc (NIH)  Date Value Ref Range Status  02/25/2021 86 0 - 99 mg/dL Final         Passed - HBA1C is between 0 and 7.9 and within 180 days    Hemoglobin A1C  Date Value Ref Range Status  05/31/2021 7.5 (A) 4.0 - 5.6 % Final   Hgb A1c MFr Bld  Date Value Ref Range Status  02/25/2021 7.7 (H) 4.8 - 5.6 % Final    Comment:             Prediabetes: 5.7 - 6.4          Diabetes: >6.4          Glycemic control for adults with diabetes: <7.0          Passed - Valid encounter within last 6 months    Recent Outpatient Visits          2 months ago Type 2 diabetes mellitus with other specified complication, without long-term current use of insulin [facility])   [facility] [place], Dionne Bucy, MD   4 months ago Acute bilateral low back pain without sciatica   [facility] Tally Joe T, FNP   5 months ago Type 2 diabetes mellitus with other specified complication, without long-term current use of insulin [facility])   [facility], [facility]ne Bu[facility], MD   8 months ago Encounter for annual physical exam   [facility], Dionne Bucy, MD   11 months ago Type 2 diabetes mellitus with hyperglycemia, without long-term current use of insulin [facility])   [place], Dionne Bucy, MD      Future Appointments            In 1 month Bacigalupo, Dionne Bucy, MD [facility], [facility]            pantoprazole (PROTONIX) 40 MG tablet [Pharmacy Med Name: PANTOPRAZOLE 40MG  TABLETS] 90 tablet 0    Sig: TAKE 1 TABLET BY MOUTH EVERY DAY AS NEEDED FOR HEARTBURN OR ACID REFLUX     Gastroenterology: Proton Pump  Inhibitors Passed - 11/01/2021  7:13 AM      Passed - Valid encounter within last 12 months    Recent Outpatient Visits          2 months ago Type 2 diabetes mellitus with other specified complication, without long-term current use of insulin ([place])   [facility] [place], Dionne Bucy, MD   4 months ago Acute bilateral low back pain without sciatica   [facility] Tally Joe T, FNP   5 months ago Type 2 diabetes mellitus with other specified complication, without long-term current use of insulin ([place])   [facility], Dionne Bucy, MD   8 months ago Encounter for annual physical exam   [facility], Dionne Bucy, MD   11 months ago Type 2 diabetes mellitus with hyperglycemia, without long-term current use of insulin ([place])   [facility], [facility] Bu[facility], MD      Future Appointments            In 1 month Bacigalupo, Dionne Bucy, MD [facility], [facility]            hydrochlorothiazide (HYDRODIURIL) 25 MG tablet [Pharmacy Med Name: HYDROCHLOROTHIAZIDE 25MG  TABLETS] 90 tablet 0    Sig: TAKE 1  TABLET(25 MG) BY MOUTH DAILY     Cardiovascular: Diuretics - Thiazide Failed - 11/01/2021  7:13 AM      Failed - Cr in normal range and within 360 days    Creatinine, Ser  Date Value Ref Range Status  10/21/2021 1.35 (H) 0.61 - 1.24 mg/dL Final         Passed - Ca in normal range and within 360 days    Calcium  Date Value Ref Range Status  10/21/2021 9.4 8.9 - 10.3 mg/dL Final         Passed - K in normal range and within 360 days    Potassium  Date Value Ref Range Status  10/21/2021 3.5 3.5 - 5.1 mmol/L Final         Passed - Na in normal range and within 360 days    Sodium  Date Value Ref Range Status  10/21/2021 138 135 - 145 mmol/L Final  04/15/2021 140 134 - 144 mmol/L Final         Passed - Last BP in normal range    BP Readings from Last 1 Encounters:  09/02/21 128/80         Passed - Valid encounter within last 6 months    Recent Outpatient Visits          2 months ago Type 2 diabetes mellitus with other specified complication, without long-term current use of insulin [facility])   [facility], Dionne Bucy, MD   4 months ago Acute bilateral low back pain without sciatica   [facility] Tally Joe T, FNP   5 months ago Type 2 diabetes mellitus with other specified complication, without long-term current use of insulin [facility])   [facility], Dionne Bucy, MD   8 months ago Encounter for annual physical exam   [facility] [place], Dionne Bucy, MD   11 months ago Type 2 diabetes mellitus with hyperglycemia, without long-term current use of insulin [facility])   [place], Dionne Bucy, MD      Future Appointments            In 1 month Bacigalupo, Dionne Bucy, MD [facility], [facility]  metFORMIN (GLUCOPHAGE) 1000 MG tablet [Pharmacy Med Name: METFORMIN 1000MG  TABLETS] 180 tablet 0    Sig: TAKE 1 TABLET BY MOUTH TWICE DAILY WITH FOOD     Endocrinology:   Diabetes - Biguanides Failed - 11/01/2021  7:13 AM      Failed - Cr in normal range and within 360 days    Creatinine, Ser  Date Value Ref Range Status  10/21/2021 1.35 (H) 0.61 - 1.24 mg/dL Final         Failed - eGFR in normal range and within 360 days    GFR calc Af Amer  Date Value Ref Range Status  08/09/2020 82 >59 mL/min/1.73 Final    Comment:    **In accordance with recommendations from the NKF-ASN Task force,**   [facility] is in the process of updating its eGFR calculation to the   2021 CKD-EPI creatinine equation that estimates kidney function   without a race variable.    GFR, Estimated  Date Value Ref Range Status  10/21/2021 56 (L) >60 mL/min Final    Comment:    (NOTE) Calculated using the CKD-EPI Creatinine Equation (2021)    eGFR  Date Value Ref Range Status  04/15/2021 59 (L) >59 mL/min/1.73 Final         Passed - HBA1C is between 0 and 7.9 and within 180 days    Hemoglobin A1C  Date Value Ref Range Status  05/31/2021 7.5 (A) 4.0 - 5.6 % Final   Hgb A1c MFr Bld  Date Value Ref Range Status  02/25/2021 7.7 (H) 4.8 - 5.6 % Final    Comment:             Prediabetes: 5.7 - 6.4          Diabetes: >6.4          Glycemic control for adults with diabetes: <7.0          Passed - Valid encounter within last 6 months    Recent Outpatient Visits          2 months ago Type 2 diabetes mellitus with other specified complication, without long-term current use of insulin [facility])   [facility] [place], [place], MD   4 months ago Acute bilateral low back pain without sciatica   [facility] T, FNP   5 months ago Type 2 diabetes mellitus with other specified complication, without long-term current use of insulin [facility])   [facility], [facility], MD   8 months ago Encounter for annual physical exam   [facility], [facility], MD   11 months ago Type 2 diabetes mellitus with  hyperglycemia, without long-term current use of insulin [facility])   [facility] Bacigalupo, [facility], MD      Future Appointments            In 1 month Bacigalupo, Marzella Schlein, MD [facility], [facility]            metoprolol tartrate (LOPRESSOR) 50 MG tablet [Pharmacy Med Name: METOPROLOL TARTRATE 50MG  TABLETS] 180 tablet 0    Sig: TAKE 1 TABLET(50 MG) BY MOUTH TWICE DAILY     Cardiovascular:  Beta Blockers Passed - 11/01/2021  7:13 AM      Passed - Last BP in normal range    BP Readings from Last 1 Encounters:  09/02/21 128/80         Passed - Last Heart Rate in normal range  Pulse Readings from Last 1 Encounters:  09/02/21 81         Passed - Valid encounter within last 6 months    Recent Outpatient Visits          2 months ago Type 2 diabetes mellitus with other specified complication, without long-term current use of insulin [facility])   [facility] [place], Dionne Bucy, MD   4 months ago Acute bilateral low back pain without sciatica   [facility] Tally Joe T, FNP   5 months ago Type 2 diabetes mellitus with other specified complication, without long-term current use of insulin [facility])   [facility], Dionne Bucy, MD   8 months ago Encounter for annual physical exam   [facility], Dionne Bucy, MD   11 months ago Type 2 diabetes mellitus with hyperglycemia, without long-term current use of insulin [facility])   [facility] Bacigalupo, Dionne Bucy, MD      Future Appointments            In 1 month Bacigalupo, Dionne Bucy, MD [facility], [facility]

## 2021-11-01 NOTE — Discharge Instructions (Addendum)
Keep arm elevated is much as possible the next few days. Work fingers but not the thumb for range of motion Call office if having problems Pain medication has been sent take as directed.   AMBULATORY SURGERY  DISCHARGE INSTRUCTIONS   The drugs that you were given will stay in your system until tomorrow so for the next 24 hours you should not:  Drive an automobile Make any legal decisions Drink any alcoholic beverage   You may resume regular meals tomorrow.  Today it is better to start with liquids and gradually work up to solid foods.  You may eat anything you prefer, but it is better to start with liquids, then soup and crackers, and gradually work up to solid foods.   Please notify your doctor immediately if you have any unusual bleeding, trouble breathing, redness and pain at the surgery site, drainage, fever, or pain not relieved by medication.    Additional Instructions:        Please contact your physician with any problems or Same Day Surgery at [phone], Monday through Friday 6 am to 4 pm, or [place] at [facility] number at [phone].

## 2021-11-01 NOTE — Anesthesia Procedure Notes (Addendum)
Anesthesia Regional Block: Axillary brachial plexus block   Pre-Anesthetic Checklist: , timeout performed,  Correct Patient, Correct Site, Correct Laterality,  Correct Procedure, Correct Position, site marked,  Risks and benefits discussed,  Surgical consent,  Pre-op evaluation,  At surgeon's request and post-op pain management  Laterality: Right  Prep: chloraprep       Needles:  Injection technique: Single-shot  Needle Type: Stimiplex     Needle Length: 9cm  Needle Gauge: 22     Additional Needles:   Procedures:,,,, ultrasound used (permanent image in chart),,    Narrative:  Start time: 11/01/2021 1:37 PM End time: 11/01/2021 1:38 PM Injection made incrementally with aspirations every 20 mL.  Performed by: Personally  Anesthesiologist: Foye Deer, MD  Additional Notes: Patient consented for risk and benefits of nerve block including but not limited to nerve damage, failed block, bleeding and infection.  Patient voiced understanding.  Functioning IV was confirmed and monitors were applied.  Timeout done prior to procedure and prior to any sedation being given to the patient.  Patient confirmed procedure site prior to any sedation given to the patient.  A 79mm 22ga Stimuplex needle was used. Sterile prep,hand hygiene and sterile gloves were used.  Minimal sedation used for procedure.  No paresthesia endorsed by patient during the procedure.  Negative aspiration and negative test dose prior to incremental administration of local anesthetic. The patient tolerated the procedure well with no immediate complications.

## 2021-11-01 NOTE — Anesthesia Postprocedure Evaluation (Signed)
Anesthesia Post Note  Patient: [name]  Procedure(s) Performed: CARPOMETACARPAL (CMC) FUSION OF THUMB (Right: Thumb)  Patient location during evaluation: PACU Anesthesia Type: General Level of consciousness: awake and alert Pain management: pain level controlled Vital Signs Assessment: post-procedure vital signs reviewed and stable Respiratory status: spontaneous breathing, nonlabored ventilation, respiratory function stable and patient connected to nasal cannula oxygen Cardiovascular status: blood pressure returned to baseline and stable Postop Assessment: no apparent nausea or vomiting Anesthetic complications: no   No notable events documented.   Last Vitals:  Vitals:   11/01/21 1600 11/01/21 1614  BP: (!) 112/99   Pulse: 80 82  Resp: (!) 25 16  Temp:  (!) 36.2 C  SpO2: 95% 100%    Last Pain:  Vitals:   11/01/21 1614  TempSrc: Temporal  PainSc: 0-No pain                 Cleda Mccreedy Marjan Rosman

## 2021-11-01 NOTE — Anesthesia Procedure Notes (Deleted)
Procedure Name: LMA Insertion Date/Time: 11/01/2021 2:05 PM Performed by: Iran Planas, CRNA Pre-anesthesia Checklist: Patient identified, Patient being monitored, Timeout performed, Emergency Drugs available and Suction available Patient Re-evaluated:Patient Re-evaluated prior to induction Oxygen Delivery Method: Circle system utilized Preoxygenation: Pre-oxygenation with 100% oxygen Induction Type: IV induction Ventilation: Mask ventilation without difficulty LMA: LMA inserted Tube type: Oral Number of attempts: 1 Placement Confirmation: positive ETCO2 and breath sounds checked- equal and bilateral Tube secured with: Tape Dental Injury: Teeth and Oropharynx as per pre-operative assessment

## 2021-11-01 NOTE — Telephone Encounter (Signed)
[facility] faxed refill request for the following medications:  atorvastatin (LIPITOR) 80 MG tablet   fenofibrate (TRICOR) 145 MG tablet   Please advise.

## 2021-11-01 NOTE — Op Note (Signed)
11/01/2021  3:29 PM  PATIENT:  [name]  71 y.o. 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  BLOOD ADMINISTERED:none  DRAINS: none   LOCAL MEDICATIONS USED:  MARCAINE     SPECIMEN:  No Specimen  DISPOSITION OF SPECIMEN:  N/A  COUNTS:  YES  TOURNIQUET:   Total Tourniquet Time Documented: Upper Arm (Right) - 51 minutes Total: Upper Arm (Right) - 51 minutes   IMPLANTS: Mini tight rope x1  DICTATION: .Dragon Dictation   patient was brought to the operating room and after adequate anesthesia was obtained the right arm was prepped and draped in the usual sterile fashion.  After patient identification timeout procedure completed tourniquet was raised.  Local anesthetic was the injected in the area of the planned incisions on both the thumb and second metacarpal.  15 cc half percent Marcaine injected.  Incision was made between the volar and dorsal skin at the base of the first metacarpal over the [facility] joint.  Skin nerves were preserved.  The joint was then opened and the trapezium removed in pieces using an oscillating saw and quartering it.  Mini C-arm was used to assess removal of the bone.  Next the wire for the mini tight rope was passed from the base of the first metacarpal into the proximal shaft of the second metacarpal.  A small incision was made at the dorsum of the second metacarpal and the wire pulled through the skin and the mini tight rope then pulled through using this wire as a anchor to pull it through.  The anchor was then placed against the base of the first metacarpal and the anchor on the second metacarpal side was then tightened with slight tension on the first metacarpal.  There is  no pistoning of the first [facility] joint on fluoroscopic exam and the sutures were further tied and sutures cut with stable fixation.  The wound was thoroughly irrigated..  Gelfoam was placed in the gap of the trapezium the capsule was closed in a running fashion with 0 Vicryl 2-0 Vicryl subcutaneously and 4-0 nylon for the skin.  Xeroform 4 x 4 thumb spica splint and Ace wrap applied with the finger in an abducted position.  Tourniquet let down after Ace wrap applied   PLAN OF CARE: Discharge to home after PACU  PATIENT DISPOSITION:  PACU - hemodynamically stable.

## 2021-11-01 NOTE — H&P (Signed)
Chief Complaint  Patient presents with   Follow-up  Right thumb pain    History of the Present Illness: [name] is a 71 y.o. male here today.   The patient presents for evaluation of carpal tunnel and thumb CMC arthritis. He has severe thumb CMC arthritis and that seemed to be more of an issue than the carpal tunnel. He had received an injection without relief. He comes in today to discuss possible [facility] arthroplasty. The patient is accompanied by her daughter today.  The injection provided relief for a few days, but then the pain returned. His carpal tunnel is somewhat bothersome, but he is worried about the pain.  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  I have reviewed past medical, surgical, social and family history, and allergies as documented in the EMR.  Past Medical History: Past Medical History:  Diagnosis Date   Arthritis   CAD (coronary artery disease)   Glaucoma (increased eye pressure)   Hyperlipidemia   Hypertension   Past Surgical History: Past Surgical History:  Procedure Laterality Date   C3 Laminoplasty 02/25/2018  Dr Erskine Emery   CARDIAC CATHETERIZATION 04/08/2008  Xience V everolimus drug eluting stent to the 75% stenosis in the mid RCA   CAROTID STENT   cataract surgery Right   cervical surgery 02/25/2018   Past Family History: Family History  Problem Relation Age of Onset   Myocardial Infarction (Heart attack) Father   Diabetes Mother   Deep vein thrombosis (DVT or abnormal blood clot formation) Sister   High blood pressure (Hypertension) Brother   Medications: Current Outpatient Medications Ordered in Epic  Medication Sig Dispense Refill   amLODIPine (NORVASC) 5 MG tablet Take 1 tablet by mouth once daily 0   aspirin-calcium carbonate 81 mg-300 mg calcium(777 mg) Tab Take 1 tablet by mouth once daily   atorvastatin (LIPITOR) 80 MG tablet Take 80 mg by mouth once daily   blood glucose diagnostic test strip Check  blood glucose daily   calcium 500 mg Tab Take 1 tablet by mouth once daily.   calcium citrate-vitamin D3 (CITRACAL+D PETITES) 200 mg calcium -250 unit tablet Take 1 tablet by mouth once daily   clopidogrel (PLAVIX) 75 mg tablet Take 75 mg by mouth once daily.   cyclobenzaprine (FLEXERIL) 5 MG tablet Take 1 tablet (5 mg total) by mouth once daily   dulaglutide (TRULICITY SUBQ) Inject subcutaneously   DULoxetine (CYMBALTA) 20 MG DR capsule Take 1 capsule (20 mg total) by mouth once daily 30 capsule 11   empagliflozin (JARDIANCE) 25 mg tablet Take 1 tablet (25 mg total) by mouth daily with breakfast 30 tablet 11   ezetimibe (ZETIA) 10 mg tablet   fenofibrate nanocrystallized (TRICOR) 145 MG tablet Take 145 mg by mouth once daily.   FUROsemide (LASIX) 20 MG tablet Take 1 tablet (20 mg total) by mouth every other day for leg swelling 30 tablet 6   hydrochlorothiazide (HYDRODIURIL) 25 MG tablet Take 25 mg by mouth every morning.   HYDROcodone-acetaminophen (NORCO) 5-325 mg tablet Take 1 tablet by mouth every 8 (eight) hours as needed   lisinopril (PRINIVIL,ZESTRIL) 20 MG tablet Take 10 mg by mouth once daily   meloxicam (MOBIC) 15 MG tablet Take 1 tablet (15 mg total) by mouth once daily   metFORMIN (GLUCOPHAGE) 1000 MG tablet Take 1,000 mg by mouth 2 (two) times daily with meals.    metoprolol tartrate (LOPRESSOR) 50 MG tablet Take 50 mg by mouth  2 (two) times daily.   miscellaneous medical supply Misc Dispense based on patient and insurance preference. Check sugars once daily   ONETOUCH DELICA LANCETS 33 gauge Misc   pantoprazole (PROTONIX) 40 MG DR tablet Take 40 mg by mouth once daily.   potassium 99 mg Tab Take 1 tablet by mouth once daily.   semaglutide (OZEMPIC) 2 mg/dose (8 mg/3 mL) pen injector Inject subcutaneously as directed   sitaGLIPtin (JANUVIA) 100 MG tablet Take 100 mg by mouth once daily   tamsulosin (FLOMAX) 0.4 mg capsule Take by mouth Take 1 capsule (0.4 mg total) by mouth 1  (one) time each day   traZODone (DESYREL) 50 MG tablet Take by mouth   TRULICITY 4.5 mg/0.5 mL subcutaneous pen injector INJECT 1 PEN UNDER THE SKIN 1 TIME A WEEK AS DIRECTED   No current Epic-ordered facility-administered medications on file.   Allergies: Allergies  Allergen Reactions   Amoxicillin Other (See Comments)  diarrhea and headache   Codeine Unknown   Fexofenadine Other (See Comments)    Body mass index is 30.14 kg/m.  Review of Systems: A comprehensive 14 point ROS was performed, reviewed, and the pertinent orthopaedic findings are documented in the HPI.  Vitals:  10/07/21 0818  BP: 120/78    General Physical Examination:  General/Constitutional: No apparent distress: well-nourished and well developed. Eyes: Pupils equal, round with synchronous movement. Lungs: Clear to auscultation HEENT: Normal Vascular: No edema, swelling or tenderness, except as noted in detailed exam. Cardiac: Heart rate and rhythm is regular. Integumentary: No impressive skin lesions present, except as noted in detailed exam. Neuro/Psych: Normal mood and affect, oriented to person, place and time.  Radiographs: No new imaging studies were obtained today.  Assessment: ICD-10-CM  1. Arthritis of carpometacarpal (CMC) joint of left thumb M18.12  2. Arthritis of carpometacarpal (CMC) joint of right thumb M18.11  3. Carpal tunnel syndrome, bilateral upper limbs G56.03   Plan:  The patient has clinical findings of severe thumb CMC arthritis and carpal tunnel syndrome.  We discussed the patient's prior x-ray findings. I recommend thumb CMC arthroplasty. I explained the surgery and postoperative course in detail.  We will schedule the patient for surgery in the near future.  Surgical Risks:  The nature of the condition and the proposed procedure has been reviewed in detail with the patient. Surgical versus non-surgical options and prognosis for recovery have been reviewed and the  inherent risks and benefits of each have been discussed including the risks of infection, bleeding, injury to nerves/blood vessels/tendons, incomplete relief of symptoms, persisting pain and/or stiffness, loss of function, complex regional pain syndrome, failure of the procedure, as appropriate.  I, Sowjanya Panditi, Quality Documentation Specialist, completed documentation using DAX technology.  Electronically signed by Marlena Clipper, MD at 10/10/2021 8:19 AM EST   Reviewed  H+P. No changes noted.

## 2021-11-02 ENCOUNTER — Encounter: Payer: Self-pay | Admitting: Orthopedic Surgery

## 2021-11-04 DIAGNOSIS — M1811 Unilateral primary osteoarthritis of first carpometacarpal joint, right hand: Secondary | ICD-10-CM | POA: Diagnosis not present

## 2021-11-09 ENCOUNTER — Encounter: Payer: Self-pay | Admitting: Orthopedic Surgery

## 2021-11-17 ENCOUNTER — Ambulatory Visit: Payer: No Typology Code available for payment source | Admitting: Occupational Therapy

## 2021-11-22 ENCOUNTER — Encounter: Payer: Self-pay | Admitting: Occupational Therapy

## 2021-11-22 ENCOUNTER — Other Ambulatory Visit: Payer: Self-pay

## 2021-11-22 ENCOUNTER — Ambulatory Visit: Payer: No Typology Code available for payment source | Attending: Orthopedic Surgery | Admitting: Occupational Therapy

## 2021-11-22 DIAGNOSIS — M25631 Stiffness of right wrist, not elsewhere classified: Secondary | ICD-10-CM

## 2021-11-22 DIAGNOSIS — M25641 Stiffness of right hand, not elsewhere classified: Secondary | ICD-10-CM | POA: Diagnosis not present

## 2021-11-22 DIAGNOSIS — M79641 Pain in right hand: Secondary | ICD-10-CM

## 2021-11-22 DIAGNOSIS — M6281 Muscle weakness (generalized): Secondary | ICD-10-CM

## 2021-11-22 NOTE — Therapy (Signed)
[facility] [address] [place], [address] Phone: [phone]   Fax:  [phone]  Occupational Therapy Evaluation  Patient Details  Name: [name] MRN: [id number] Date of Birth: 08-27-1951 Referring Provider (OT): Dr Rudene Christians   Encounter Date: 11/22/2021   OT End of Session - 11/22/21 1555     Visit Number 1    Number of Visits 16    Date for OT Re-Evaluation 01/24/22    OT Start Time 1200    OT Stop Time 1248    OT Time Calculation (min) 48 min    Activity Tolerance Patient tolerated treatment well;Patient limited by pain    Behavior During Therapy [facility] for tasks assessed/performed;Impulsive             Past Medical History:  Diagnosis Date   Arthritis    back   Cataracts, bilateral    a.) s/p extractions in 2018   Coronary artery disease 04/08/2008   a.)  LHC 04/08/2008: EF 48%; 75% stenosis mRCA --> PCI performed placing a 3.5 x 18 mm Xience V DES x1.   Depression    GERD (gastroesophageal reflux disease)    Glaucoma    Hyperlipidemia    Hypertension    Stage 3b chronic kidney disease ([place])    T2DM (type 2 diabetes mellitus) ([place])     Past Surgical History:  Procedure Laterality Date   CAROTID STENT     CARPOMETACARPAL ([place]) FUSION OF THUMB Right 11/01/2021   Procedure: CARPOMETACARPAL [facility]) FUSION OF THUMB;  Surgeon: Hessie Knows, MD;  Location: [facility];  Service: Orthopedics;  Laterality: Right;   CATARACT EXTRACTION W/PHACO Right 03/01/2017   Procedure: CATARACT EXTRACTION PHACO AND INTRAOCULAR LENS PLACEMENT (IOC);  Surgeon: Eulogio Bear, MD;  Location: [facility];  Service: Ophthalmology;  Laterality: Right;  [place] 22.6 AP% 6.1 CDE 1.37 Fluid pack lot # [id number]   CATARACT EXTRACTION W/PHACO Left 05/01/2017   Procedure: CATARACT EXTRACTION PHACO AND INTRAOCULAR LENS PLACEMENT (IOC)  Left Diabetic;  Surgeon: Eulogio Bear, MD;  Location: [place];  Service:  Ophthalmology;  Laterality: Left;  Block Diabetic   CORONARY ANGIOPLASTY WITH STENT PLACEMENT Left 04/08/2008   Procedure: CORONARY ANGIOPLASTY WITH STENT PLACEMENT (3.5 x 18 mm Xience V DES x1 to mRCA); Location: [place]; Surgeon: Jonetta Speak, MD   POSTERIOR CERVICAL LAMINECTOMY N/A 02/25/2018   Procedure: POSTERIOR CERVICAL LAMINECTOMY-CERVICAL 3;  Surgeon: Kathlene November, MD;  Location: [facility];  Service: Neurosurgery;  Laterality: N/A;    There were no vitals filed for this visit.   Subjective Assessment - 11/22/21 1431     Subjective  Doing okay - I am taking the splint off some and massage -but it is sore can do feel to good - pain about 4/10 - did take some pain medication before I came  - the Dr said  I can take splint off durign day  - before my surgery my thumb hanged and was really pain full    Pertinent History Pt s/p  right thumb CMC arthroplasty by Dr. Hessie Knows on 11/01/2021. Seen by PA to remove stitches on 11/15/21 - at that time Patient still having moderate pain. Swelling has significantly improved. He is taking Norco for his chronic pain but is having to take more than normal for breakthrough pain. Denies any numbness or tingling. No warmth erythema or drainage.Stitches were remove and stay in thumb spica until about 3 wks and then  during day can take off and sleep still with it -refer to OT    Patient Stated Goals I want the pain better in my thumb and able to use it again  in yardwork , mowing lawn and do things around the house and help my neighbors    Currently in Pain? Yes    Pain Score 4     Pain Location Hand    Pain Orientation Right    Pain Descriptors / Indicators Aching;Tender;Sore    Pain Type Surgical pain    Pain Onset More than a month ago    Pain Frequency Constant               [facility] OT Assessment - 11/22/21 0001       Assessment   Medical Diagnosis R CMCarthroplasty    Referring Provider (OT) Dr Rudene Christians    Onset Date/Surgical Date  11/01/21    Hand Dominance Right      Precautions   Required Braces or Orthoses --   Keep thumb spica on during day and night tine -of for HEP or when sitting     [place] Retired    Leisure likes to work in yard, Community education officer, help neighbours - outside Art therapist,  daughter close by      AROM   Right Wrist Extension 50 Degrees    Right Wrist Flexion 50 Degrees    Right Wrist Radial Deviation 12 Degrees    Right Wrist Ulnar Deviation 20 Degrees      Right Hand AROM   R Thumb Radial ABduction/ADduction 0-55 42    R Thumb Palmar ABduction/ADduction 0-45 42    R Thumb Opposition to Index --   Opposition to only 2nd and 3rd - collapsing into ADD when to 4th and 5th                 Contrast done with pt and ed pt and daughter on doing HEP  2 -3 x day  Ed on thumb PA and RA  Opposition to 2nd and 3rd digits- focus on OVAL and open or extend thumb each time  8 reps  Pt to slow down and quality movement more than quantity   AROM wrist flexion, ext,RD ,UD - 1 0reps pain free  Keep pain under 1-2/10  And keep splint on in between HEP to get thumb stronger not to collapse into palm              OT Education - 11/22/21 1554     Education Details Findings of eval and HEP/splint wearing    Person(s) Educated Patient;Child(ren)    Methods Explanation;Demonstration;Tactile cues;Verbal cues;Handout    Comprehension Verbal cues required;Returned demonstration;Verbalized understanding              OT Short Term Goals - 11/22/21 1600       OT SHORT TERM GOAL #1   Title Pt to be independent in HEP and  splint wearing  to decrease pain to tolerate HEP for thumb and wrist AROM in all planes    Baseline pain coming in 4/10 at rest and splint off - with pain medication- could not tolerate all the HEP that OT wanted to provide    Time 2    Period Weeks    Status New    Target Date 12/06/21  OT Long Term Goals - 11/22/21 1602       OT LONG TERM GOAL #1   Title R thumb AROM increase for PA /RA and oppositon to 5th without increase pain to wean out of splint and initiate strengthening    Baseline AROM for thumb in all planes decrease - splint thumb spica - 3 wks s/p and pain rest 4/10    Time 3    Status New    Target Date 12/13/21      OT LONG TERM GOAL #2   Title R thumb strength increase to 4+/5 to pick up cup , turn doorknob    Baseline no strength- thumb spica - sterri strips off today - 3 wks s/p - pain rest 4/10 and AROM limited in all planes    Time 5    Period Weeks    Status New    Target Date 12/27/21      OT LONG TERM GOAL #3   Title R wrist AROM and strength increase to WNL to push up from chair , do bathing and dressing with not issues    Baseline wrist AROM decrease in all planes and some pain with RD and flexion, ext -4 /10 pain    Time 6    Period Weeks    Status New    Target Date 01/03/22      OT LONG TERM GOAL #4   Title R grip and prehension strength increase for pt to hold plate , pull and push door and carry more than 8 lbs    Baseline NT - 3 wks s/p - pain at rest 4/10 - decrease AROM in wrist and thumb    Time 9    Period Weeks    Status New    Target Date 01/24/22                   Plan - 11/22/21 1556     Clinical Impression Statement Pt present at OT eval s/p R thumb CMC arthroplasty on 11/01/21 - pt is now 3 wks s/p and present with splint off and pain increase to 4/10 with pain medication. Thumb hanging into flexion coming in. Pt ed on wearing splint inbetween HEP for 1-2 wks to decrease pain for OT to increase AROM and strength pain free. Pt show decrease AROM in R wrist and thumb in all planes - decrease strength and increase pain. Removed this date sterri strips and ed on scar massage. Pt limited in use of R dominant hand in ADL'sand IADL's because of deficits - pt can benefit from skilled OT services.    OT Occupational  Profile and History Problem Focused Assessment - Including review of records relating to presenting problem    Occupational performance deficits (Please refer to evaluation for details): ADL's;IADL's;Play;Leisure;Social Participation    Body Structure / Function / Physical Skills ADL;Strength;Dexterity;Pain;Edema;UE functional use;ROM;IADL;Scar mobility;Flexibility;FMC    Rehab Potential Good    Clinical Decision Making Limited treatment options, no task modification necessary    Comorbidities Affecting Occupational Performance: None    Modification or Assistance to Complete Evaluation  No modification of tasks or assist necessary to complete eval    OT Frequency --   1-2 x wk   OT Duration --   9 wks   OT Treatment/Interventions Self-care/ADL training;Fluidtherapy;Splinting;Therapeutic activities;Contrast Bath;Therapeutic exercise;Scar mobilization;Passive range of motion;Paraffin;Manual Therapy;Patient/family education    Consulted and Agree with Plan of Care Patient  Patient will benefit from skilled therapeutic intervention in order to improve the following deficits and impairments:   Body Structure / Function / Physical Skills: ADL, Strength, Dexterity, Pain, Edema, UE functional use, ROM, IADL, Scar mobility, Flexibility, FMC       Visit Diagnosis: Pain in right hand - Plan: Ot plan of care cert/re-cert  Stiffness of right hand, not elsewhere classified - Plan: Ot plan of care cert/re-cert  Stiffness of right wrist, not elsewhere classified - Plan: Ot plan of care cert/re-cert  Muscle weakness (generalized) - Plan: Ot plan of care cert/re-cert    Problem List Patient Active Problem List   Diagnosis Date Noted   Acute bilateral low back pain without sciatica 06/21/2021   Stage 3b chronic kidney disease (CKD) ([place]) 05/31/2021   Intermittent claudication ([place]) 02/25/2021   Overweight 01/22/2020   Spondylosis of lumbar region without myelopathy or radiculopathy  03/08/2016   Arteriosclerosis of coronary artery 06/04/2015   S/P coronary artery stent placement 06/04/2015   CAD in native artery 06/04/2015   Hypertension associated with diabetes ([place]) 05/28/2015   Hyperlipidemia associated with type 2 diabetes mellitus ([place]) 05/28/2015   Insomnia 05/28/2015   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, [place], [address] Phone: [phone]   Fax:  [phone]  Name: [name] MRN: [id number] Date of Birth: 1950-11-23

## 2021-11-29 ENCOUNTER — Other Ambulatory Visit: Payer: Self-pay

## 2021-11-29 ENCOUNTER — Ambulatory Visit: Payer: No Typology Code available for payment source | Attending: Orthopedic Surgery | Admitting: Occupational Therapy

## 2021-11-29 DIAGNOSIS — M25631 Stiffness of right wrist, not elsewhere classified: Secondary | ICD-10-CM

## 2021-11-29 DIAGNOSIS — M79641 Pain in right hand: Secondary | ICD-10-CM | POA: Diagnosis not present

## 2021-11-29 DIAGNOSIS — M6281 Muscle weakness (generalized): Secondary | ICD-10-CM

## 2021-11-29 DIAGNOSIS — M25641 Stiffness of right hand, not elsewhere classified: Secondary | ICD-10-CM

## 2021-11-29 NOTE — Therapy (Signed)
[facility] [address], [place], [address] Phone: [phone]   Fax:  [phone]  Occupational Therapy Treatment  Patient Details  Name: [name] MRN: [id number] Date of Birth: 11-Aug-1951 Referring Provider (OT): Dr Rudene Christians   Encounter Date: 11/29/2021   OT End of Session - 11/29/21 1159     Visit Number 2    Number of Visits 16    Date for OT Re-Evaluation 01/24/22    OT Start Time 1200    OT Stop Time 1245    OT Time Calculation (min) 45 min    Activity Tolerance Patient tolerated treatment well;Patient limited by pain    Behavior During Therapy [facility] for tasks assessed/performed;Impulsive             Past Medical History:  Diagnosis Date   Arthritis    back   Cataracts, bilateral    a.) s/p extractions in 2018   Coronary artery disease 04/08/2008   a.)  LHC 04/08/2008: EF 48%; 75% stenosis mRCA --> PCI performed placing a 3.5 x 18 mm Xience V DES x1.   Depression    GERD (gastroesophageal reflux disease)    Glaucoma    Hyperlipidemia    Hypertension    Stage 3b chronic kidney disease ([place])    T2DM (type 2 diabetes mellitus) ([place])     Past Surgical History:  Procedure Laterality Date   CAROTID STENT     CARPOMETACARPAL ([place]) FUSION OF THUMB Right 11/01/2021   Procedure: CARPOMETACARPAL [facility]) FUSION OF THUMB;  Surgeon: Hessie Knows, MD;  Location: [facility];  Service: Orthopedics;  Laterality: Right;   CATARACT EXTRACTION W/PHACO Right 03/01/2017   Procedure: CATARACT EXTRACTION PHACO AND INTRAOCULAR LENS PLACEMENT (IOC);  Surgeon: Eulogio Bear, MD;  Location: [facility];  Service: Ophthalmology;  Laterality: Right;  [place] 22.6 AP% 6.1 CDE 1.37 Fluid pack lot # [id number]   CATARACT EXTRACTION W/PHACO Left 05/01/2017   Procedure: CATARACT EXTRACTION PHACO AND INTRAOCULAR LENS PLACEMENT (IOC)  Left Diabetic;  Surgeon: Eulogio Bear, MD;  Location: [place];  Service:  Ophthalmology;  Laterality: Left;  Block Diabetic   CORONARY ANGIOPLASTY WITH STENT PLACEMENT Left 04/08/2008   Procedure: CORONARY ANGIOPLASTY WITH STENT PLACEMENT (3.5 x 18 mm Xience V DES x1 to mRCA); Location: [place]; Surgeon: Jonetta Speak, MD   POSTERIOR CERVICAL LAMINECTOMY N/A 02/25/2018   Procedure: POSTERIOR CERVICAL LAMINECTOMY-CERVICAL 3;  Surgeon: Kathlene November, MD;  Location: [facility];  Service: Neurosurgery;  Laterality: N/A;    There were no vitals filed for this visit.   Subjective Assessment - 11/29/21 1158     Subjective  Pain is better -and doing your exercises 3 x day -but had to look after my great grand baby- but made sure I have my splint on when with her    Pertinent History Pt s/p  right thumb CMC arthroplasty by Dr. Hessie Knows on 11/01/2021. Seen by PA to remove stitches on 11/15/21 - at that time Patient still having moderate pain. Swelling has significantly improved. He is taking Norco for his chronic pain but is having to take more than normal for breakthrough pain. Denies any numbness or tingling. No warmth erythema or drainage.Stitches were remove and stay in thumb spica until about 3 wks and then during day can take off and sleep still with it -refer to OT    Patient Stated Goals I want the pain better in my thumb and able to  use it again  in yardwork , mowing lawn and do things around the house and help my neighbors    Currently in Pain? Yes    Pain Score 1     Pain Location Hand    Pain Orientation Right    Pain Descriptors / Indicators Aching;Tender;Sore    Pain Type Surgical pain    Pain Onset More than a month ago    Pain Frequency Constant                [facility] OT Assessment - 11/29/21 0001       AROM   Right Wrist Extension 60 Degrees    Right Wrist Flexion 50 Degrees    Right Wrist Radial Deviation 18 Degrees    Right Wrist Ulnar Deviation 28 Degrees      Right Hand AROM   R Thumb Radial ABduction/ADduction 0-55 50    R Thumb  Palmar ABduction/ADduction 0-45 48             MC spreads done and webspace massage prior ROM - scar massage done -but hold off on cica scar pad - small area of scab still  Can start shower hand but not gripping or squeezing with thumb          OT Treatments/Exercises (OP) - 11/29/21 0001       RUE Fluidotherapy   Number Minutes Fluidotherapy 8 Minutes    RUE Fluidotherapy Location Hand;Wrist    Comments AROM in wrist and thumb in all planes             Pt can switch to heat alone if pain and edema is less than 2/10  Other wise cont with Contrast 2 -3 x day  Ed on thumb PA and RA - reinforce RA on table on paper to slide  Opposition to  all digits and pick up 2 cm foam block but reinforce open thumb into RA every time time and making OVAL  8 reps  Pt to slow down and quality movement more than quantity  AGAIN REINFORCE Add and review AAROM FOR wrist flexion, ext , RD< UD over edge of table   AROM wrist flexion, ext,RD ,UD - 1 0reps pain free  Keep pain under 1-2/10  And keep splint on in between HEP to get thumb stronger not to collapse into palm to take off sitting watching tv        OT Education - 11/29/21 1159     Education Details progress and changes to HEP    Person(s) Educated Patient;Child(ren)    Methods Explanation;Demonstration;Tactile cues;Verbal cues;Handout    Comprehension Verbal cues required;Returned demonstration;Verbalized understanding              OT Short Term Goals - 11/22/21 1600       OT SHORT TERM GOAL #1   Title Pt to be independent in HEP and  splint wearing  to decrease pain to tolerate HEP for thumb and wrist AROM in all planes    Baseline pain coming in 4/10 at rest and splint off - with pain medication- could not tolerate all the HEP that OT wanted to provide    Time 2    Period Weeks    Status New    Target Date 12/06/21               OT Long Term Goals - 11/22/21 1602       OT LONG TERM GOAL #1   Title  R  thumb AROM increase for PA /RA and oppositon to 5th without increase pain to wean out of splint and initiate strengthening    Baseline AROM for thumb in all planes decrease - splint thumb spica - 3 wks s/p and pain rest 4/10    Time 3    Status New    Target Date 12/13/21      OT LONG TERM GOAL #2   Title R thumb strength increase to 4+/5 to pick up cup , turn doorknob    Baseline no strength- thumb spica - sterri strips off today - 3 wks s/p - pain rest 4/10 and AROM limited in all planes    Time 5    Period Weeks    Status New    Target Date 12/27/21      OT LONG TERM GOAL #3   Title R wrist AROM and strength increase to WNL to push up from chair , do bathing and dressing with not issues    Baseline wrist AROM decrease in all planes and some pain with RD and flexion, ext -4 /10 pain    Time 6    Period Weeks    Status New    Target Date 01/03/22      OT LONG TERM GOAL #4   Title R grip and prehension strength increase for pt to hold plate , pull and push door and carry more than 8 lbs    Baseline NT - 3 wks s/p - pain at rest 4/10 - decrease AROM in wrist and thumb    Time 9    Period Weeks    Status New    Target Date 01/24/22                   Plan - 11/29/21 1159     Clinical Impression Statement Pt present at OT eval s/p R thumb CMC arthroplasty on 11/01/21 - pt is now 4 wks s/p  - pain decrease from 4 to 1/10 today compare to last week - making progress in thumb and wrist AROM -but pt to focus on thumb RA and oppositoin to 4th and 5th -and wrist flexion - pt impulsive and quick  Pt ed on wearing splint inbetween HEP for  anothe 1-2 wks to decrease pain for OT to increase AROM and strength pain free range. Pt show decrease AROM in R wrist and thumb in all planes - decrease strength and increase pain. Pt ed on scar massage but still have little scab on and did not do cica scar pad yet. Pt limited in use of R dominant hand in ADL'sand IADL's because of deficits - pt  can benefit from skilled OT services.    OT Occupational Profile and History Problem Focused Assessment - Including review of records relating to presenting problem    Occupational performance deficits (Please refer to evaluation for details): ADL's;IADL's;Play;Leisure;Social Participation    Body Structure / Function / Physical Skills ADL;Strength;Dexterity;Pain;Edema;UE functional use;ROM;IADL;Scar mobility;Flexibility;FMC    Rehab Potential Good    Clinical Decision Making Limited treatment options, no task modification necessary    Comorbidities Affecting Occupational Performance: None    Modification or Assistance to Complete Evaluation  No modification of tasks or assist necessary to complete eval    OT Frequency --   1-2 x wk   OT Duration 8 weeks    OT Treatment/Interventions Self-care/ADL training;Fluidtherapy;Splinting;Therapeutic activities;Contrast Bath;Therapeutic exercise;Scar mobilization;Passive range of motion;Paraffin;Manual Therapy;Patient/family education    Consulted and Agree with Plan of  Care Patient             Patient will benefit from skilled therapeutic intervention in order to improve the following deficits and impairments:   Body Structure / Function / Physical Skills: ADL, Strength, Dexterity, Pain, Edema, UE functional use, ROM, IADL, Scar mobility, Flexibility, [facility]       Visit Diagnosis: Pain in right hand  Stiffness of right hand, not elsewhere classified  Stiffness of right wrist, not elsewhere classified  Muscle weakness (generalized)    Problem List Patient Active Problem List   Diagnosis Date Noted   Acute bilateral low back pain without sciatica 06/21/2021   Stage 3b chronic kidney disease (CKD) ([facility]) 05/31/2021   Intermittent claudication ([place]) 02/25/2021   Overweight 01/22/2020   Spondylosis of lumbar region without myelopathy or radiculopathy 03/08/2016   Arteriosclerosis of coronary artery 06/04/2015   S/P coronary artery stent  placement 06/04/2015   CAD in native artery 06/04/2015   Hypertension associated with diabetes ([place]) 05/28/2015   Hyperlipidemia associated with type 2 diabetes mellitus ([place]) 05/28/2015   Insomnia 05/28/2015   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, [place], [address] Phone: [phone]   Fax:  [phone]  Name: DONTRAIL SLIVINSKI MRN: [id number] Date of Birth: Sep 10, 1951

## 2021-12-01 ENCOUNTER — Ambulatory Visit: Payer: No Typology Code available for payment source | Admitting: Occupational Therapy

## 2021-12-01 ENCOUNTER — Other Ambulatory Visit: Payer: Self-pay

## 2021-12-01 DIAGNOSIS — E119 Type 2 diabetes mellitus without complications: Secondary | ICD-10-CM | POA: Diagnosis not present

## 2021-12-01 DIAGNOSIS — M25641 Stiffness of right hand, not elsewhere classified: Secondary | ICD-10-CM

## 2021-12-01 DIAGNOSIS — M25631 Stiffness of right wrist, not elsewhere classified: Secondary | ICD-10-CM

## 2021-12-01 DIAGNOSIS — M79641 Pain in right hand: Secondary | ICD-10-CM

## 2021-12-01 DIAGNOSIS — M6281 Muscle weakness (generalized): Secondary | ICD-10-CM

## 2021-12-01 LAB — HM DIABETES EYE EXAM

## 2021-12-01 NOTE — Therapy (Signed)
[place] [facility] [address] [place], [address] Phone: [phone]   Fax:  [phone]  Occupational Therapy Treatment  Patient Details  Name: [name] MRN: [id number] Date of Birth: 07-02-1951 Referring Provider (OT): Dr Rudene Christians   Encounter Date: 12/01/2021   OT End of Session - 12/01/21 1304     Visit Number 3    Number of Visits 16    Date for OT Re-Evaluation 01/24/22    OT Start Time [id number]    OT Stop Time 1346    OT Time Calculation (min) 41 min    Activity Tolerance Patient tolerated treatment well    Behavior During Therapy [facility] for tasks assessed/performed;Impulsive             Past Medical History:  Diagnosis Date   Arthritis    back   Cataracts, bilateral    a.) s/p extractions in 2018   Coronary artery disease 04/08/2008   a.)  LHC 04/08/2008: EF 48%; 75% stenosis mRCA --> PCI performed placing a 3.5 x 18 mm Xience V DES x1.   Depression    GERD (gastroesophageal reflux disease)    Glaucoma    Hyperlipidemia    Hypertension    Stage 3b chronic kidney disease ([place])    T2DM (type 2 diabetes mellitus) ([place])     Past Surgical History:  Procedure Laterality Date   CAROTID STENT     CARPOMETACARPAL ([place]) FUSION OF THUMB Right 11/01/2021   Procedure: CARPOMETACARPAL [facility] FUSION OF THUMB;  Surgeon: Hessie Knows, MD;  Location: [facility];  Service: Orthopedics;  Laterality: Right;   CATARACT EXTRACTION W/PHACO Right 03/01/2017   Procedure: CATARACT EXTRACTION PHACO AND INTRAOCULAR LENS PLACEMENT (IOC);  Surgeon: Eulogio Bear, MD;  Location: [facility];  Service: Ophthalmology;  Laterality: Right;  [place] 22.6 AP% 6.1 CDE 1.37 Fluid pack lot # [id number]   CATARACT EXTRACTION W/PHACO Left 05/01/2017   Procedure: CATARACT EXTRACTION PHACO AND INTRAOCULAR LENS PLACEMENT (IOC)  Left Diabetic;  Surgeon: Eulogio Bear, MD;  Location: [place];  Service: Ophthalmology;  Laterality:  Left;  Block Diabetic   CORONARY ANGIOPLASTY WITH STENT PLACEMENT Left 04/08/2008   Procedure: CORONARY ANGIOPLASTY WITH STENT PLACEMENT (3.5 x 18 mm Xience V DES x1 to mRCA); Location: [place]; Surgeon: Jonetta Speak, MD   POSTERIOR CERVICAL LAMINECTOMY N/A 02/25/2018   Procedure: POSTERIOR CERVICAL LAMINECTOMY-CERVICAL 3;  Surgeon: Kathlene November, MD;  Location: [facility];  Service: Neurosurgery;  Laterality: N/A;    There were no vitals filed for this visit.   Subjective Assessment - 12/01/21 1304     Subjective  Doing okay -I don't have as much time with having my 20 month old granddaugther with me - but I am doing it at night time - doing my best - pain good    Pertinent History Pt s/p  right thumb CMC arthroplasty by Dr. Hessie Knows on 11/01/2021. Seen by PA to remove stitches on 11/15/21 - at that time Patient still having moderate pain. Swelling has significantly improved. He is taking Norco for his chronic pain but is having to take more than normal for breakthrough pain. Denies any numbness or tingling. No warmth erythema or drainage.Stitches were remove and stay in thumb spica until about 3 wks and then during day can take off and sleep still with it -refer to OT    Patient Stated Goals I want the pain better in my thumb and able to  use it again  in yardwork , mowing lawn and do things around the house and help my neighbors    Currently in Pain? Yes    Pain Score 1     Pain Location Hand    Pain Orientation Right    Pain Descriptors / Indicators Sore    Pain Type Surgical pain    Pain Onset More than a month ago    Pain Frequency Constant                [facility] OT Assessment - 12/01/21 0001       AROM   Right Wrist Extension 65 Degrees    Right Wrist Flexion 45 Degrees    Right Wrist Radial Deviation 18 Degrees    Right Wrist Ulnar Deviation 28 Degrees             Flexion of wrist decrease did focus after fluido on wrist flexion stretch          OT  Treatments/Exercises (OP) - 12/01/21 0001       RUE Fluidotherapy   Number Minutes Fluidotherapy 8 Minutes    RUE Fluidotherapy Location Hand;Wrist    Comments AROM for wrist and thumb in all planes            Pt can switch to heat alone if pain and edema is less than 21-/10  Other wise cont with Contrast 2 -3 x day  Soft tissue mobs done to webspace and graston tool for volar and dorsal forearm and wrist prior to stretches for wrist flexion , ext   AAROM and PROM by OT for wrist flexion and extention  Ed on thumb PA and RA - reinforce RA on table on paper to slide  Opposition to  all digits and pick up 2 cm foam block but reinforce open thumb into RA every time time and making OVAL  8 reps  Pt to slow down and quality movement more than quantity  AGAIN REINFORCE review AAROM FOR wrist flexion, ext , RD< UD over edge of table - pt to cont with at home  AROM wrist flexion, ext,RD ,UD - 1 0reps pain free  Keep pain under 1-2/10  And keep splint on in between HEP and around grand daughter but fitted with thumb CMC neoprene to use inbetween during day hour to 2 hrs at time to support [facility] and maybe prevent hyper extention of MC during PA and RA         OT Education - 12/01/21 1304     Education Details progress and changes to HEP    Person(s) Educated Patient;Child(ren)    Methods Explanation;Demonstration;Tactile cues;Verbal cues;Handout    Comprehension Verbal cues required;Returned demonstration;Verbalized understanding              OT Short Term Goals - 11/22/21 1600       OT SHORT TERM GOAL #1   Title Pt to be independent in HEP and  splint wearing  to decrease pain to tolerate HEP for thumb and wrist AROM in all planes    Baseline pain coming in 4/10 at rest and splint off - with pain medication- could not tolerate all the HEP that OT wanted to provide    Time 2    Period Weeks    Status New    Target Date 12/06/21               OT Long Term Goals -  11/22/21 [id number]  OT LONG TERM GOAL #1   Title R thumb AROM increase for PA /RA and oppositon to 5th without increase pain to wean out of splint and initiate strengthening    Baseline AROM for thumb in all planes decrease - splint thumb spica - 3 wks s/p and pain rest 4/10    Time 3    Status New    Target Date 12/13/21      OT LONG TERM GOAL #2   Title R thumb strength increase to 4+/5 to pick up cup , turn doorknob    Baseline no strength- thumb spica - sterri strips off today - 3 wks s/p - pain rest 4/10 and AROM limited in all planes    Time 5    Period Weeks    Status New    Target Date 12/27/21      OT LONG TERM GOAL #3   Title R wrist AROM and strength increase to WNL to push up from chair , do bathing and dressing with not issues    Baseline wrist AROM decrease in all planes and some pain with RD and flexion, ext -4 /10 pain    Time 6    Period Weeks    Status New    Target Date 01/03/22      OT LONG TERM GOAL #4   Title R grip and prehension strength increase for pt to hold plate , pull and push door and carry more than 8 lbs    Baseline NT - 3 wks s/p - pain at rest 4/10 - decrease AROM in wrist and thumb    Time 9    Period Weeks    Status New    Target Date 01/24/22                   Plan - 12/01/21 1305     Clinical Impression Statement Pt present at OT eval s/p R thumb CMC arthroplasty on 11/01/21 - pt is now 4 1/2  wks s/p  - pain decrease from 4 to 1/10 compare to last week - making progress in thumb and wrist AROM  but hyper extending at [facility] of thumb during PA and RA - and wrist flexion limited . Review again with pt his AAROM and opposition to all digits -  pt impulsive and quick  Pt ed on wearing thumb spica when around his grand daughter but can go into soft neoprene CMC splint few times during day for 1-2 hrs at time. Pt do show decrease pain but need soft tissue to webspace and not to hyper extend at [facility] of thumb . Cont to show decrease AROM in R  wrist and thumb - decrease strength. Pt ed on scar massage and use of cica scar pad. Pt limited in use of R dominant hand in ADL'sand IADL's because of deficits - pt can benefit from skilled OT services.    OT Occupational Profile and History Problem Focused Assessment - Including review of records relating to presenting problem    Occupational performance deficits (Please refer to evaluation for details): ADL's;IADL's;Play;Leisure;Social Participation    Body Structure / Function / Physical Skills ADL;Strength;Dexterity;Pain;Edema;UE functional use;ROM;IADL;Scar mobility;Flexibility;FMC    Rehab Potential Good    Clinical Decision Making Limited treatment options, no task modification necessary    Comorbidities Affecting Occupational Performance: None    Modification or Assistance to Complete Evaluation  No modification of tasks or assist necessary to complete eval    OT Frequency --  1-2 x wk   OT Duration 8 weeks    OT Treatment/Interventions Self-care/ADL training;Fluidtherapy;Splinting;Therapeutic activities;Contrast Bath;Therapeutic exercise;Scar mobilization;Passive range of motion;Paraffin;Manual Therapy;Patient/family education    Consulted and Agree with Plan of Care Patient             Patient will benefit from skilled therapeutic intervention in order to improve the following deficits and impairments:   Body Structure / Function / Physical Skills: ADL, Strength, Dexterity, Pain, Edema, UE functional use, ROM, IADL, Scar mobility, Flexibility, [facility]       Visit Diagnosis: Pain in right hand  Stiffness of right hand, not elsewhere classified  Stiffness of right wrist, not elsewhere classified  Muscle weakness (generalized)    Problem List Patient Active Problem List   Diagnosis Date Noted   Acute bilateral low back pain without sciatica 06/21/2021   Stage 3b chronic kidney disease (CKD) ([place]) 05/31/2021   Intermittent claudication ([place]) 02/25/2021   Overweight  01/22/2020   Spondylosis of lumbar region without myelopathy or radiculopathy 03/08/2016   Arteriosclerosis of coronary artery 06/04/2015   S/P coronary artery stent placement 06/04/2015   CAD in native artery 06/04/2015   Hypertension associated with diabetes ([place]) 05/28/2015   Hyperlipidemia associated with type 2 diabetes mellitus ([place]) 05/28/2015   Insomnia 05/28/2015   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 [facility] [address], [place], [address] Phone: [phone]   Fax:  [phone]  Name: [name] MRN: [id number] Date of Birth: 1950-12-19

## 2021-12-06 ENCOUNTER — Ambulatory Visit: Payer: No Typology Code available for payment source | Admitting: Occupational Therapy

## 2021-12-06 ENCOUNTER — Other Ambulatory Visit: Payer: Self-pay

## 2021-12-06 DIAGNOSIS — M25631 Stiffness of right wrist, not elsewhere classified: Secondary | ICD-10-CM

## 2021-12-06 DIAGNOSIS — M79641 Pain in right hand: Secondary | ICD-10-CM

## 2021-12-06 DIAGNOSIS — M25641 Stiffness of right hand, not elsewhere classified: Secondary | ICD-10-CM

## 2021-12-06 DIAGNOSIS — M6281 Muscle weakness (generalized): Secondary | ICD-10-CM

## 2021-12-06 NOTE — Therapy (Signed)
[place] [facility] [address] [place], [address] Phone: [phone]   Fax:  [phone]  Occupational Therapy Treatment  Patient Details  Name: [name] MRN: [id number] Date of Birth: 06-May-1951 Referring Provider (OT): Dr Rudene Christians   Encounter Date: 12/06/2021   OT End of Session - 12/06/21 0919     Visit Number 4    Number of Visits 16    Date for OT Re-Evaluation 01/24/22    OT Start Time 0900    OT Stop Time 0943    OT Time Calculation (min) 43 min    Activity Tolerance Patient tolerated treatment well    Behavior During Therapy [facility] for tasks assessed/performed             Past Medical History:  Diagnosis Date   Arthritis    back   Cataracts, bilateral    a.) s/p extractions in 2018   Coronary artery disease 04/08/2008   a.)  LHC 04/08/2008: EF 48%; 75% stenosis mRCA --> PCI performed placing a 3.5 x 18 mm Xience V DES x1.   Depression    GERD (gastroesophageal reflux disease)    Glaucoma    Hyperlipidemia    Hypertension    Stage 3b chronic kidney disease ([place])    T2DM (type 2 diabetes mellitus) ([place])     Past Surgical History:  Procedure Laterality Date   CAROTID STENT     CARPOMETACARPAL ([place]) FUSION OF THUMB Right 11/01/2021   Procedure: CARPOMETACARPAL [facility]) FUSION OF THUMB;  Surgeon: Hessie Knows, MD;  Location: [facility];  Service: Orthopedics;  Laterality: Right;   CATARACT EXTRACTION W/PHACO Right 03/01/2017   Procedure: CATARACT EXTRACTION PHACO AND INTRAOCULAR LENS PLACEMENT (IOC);  Surgeon: Eulogio Bear, MD;  Location: [facility];  Service: Ophthalmology;  Laterality: Right;  [place] 22.6 AP% 6.1 CDE 1.37 Fluid pack lot # [id number]   CATARACT EXTRACTION W/PHACO Left 05/01/2017   Procedure: CATARACT EXTRACTION PHACO AND INTRAOCULAR LENS PLACEMENT (IOC)  Left Diabetic;  Surgeon: Eulogio Bear, MD;  Location: [place];  Service: Ophthalmology;  Laterality: Left;   Block Diabetic   CORONARY ANGIOPLASTY WITH STENT PLACEMENT Left 04/08/2008   Procedure: CORONARY ANGIOPLASTY WITH STENT PLACEMENT (3.5 x 18 mm Xience V DES x1 to mRCA); Location: [place]; Surgeon: Jonetta Speak, MD   POSTERIOR CERVICAL LAMINECTOMY N/A 02/25/2018   Procedure: POSTERIOR CERVICAL LAMINECTOMY-CERVICAL 3;  Surgeon: Kathlene November, MD;  Location: [facility];  Service: Neurosurgery;  Laterality: N/A;    There were no vitals filed for this visit.   Subjective Assessment - 12/06/21 0917     Subjective  My thumb is getting better used it some - had the soft splint and was picking up some sticks- using it with bathing and dressing - I can do buttons better    Pertinent History Pt s/p  right thumb CMC arthroplasty by Dr. Hessie Knows on 11/01/2021. Seen by PA to remove stitches on 11/15/21 - at that time Patient still having moderate pain. Swelling has significantly improved. He is taking Norco for his chronic pain but is having to take more than normal for breakthrough pain. Denies any numbness or tingling. No warmth erythema or drainage.Stitches were remove and stay in thumb spica until about 3 wks and then during day can take off and sleep still with it -refer to OT    Patient Stated Goals I want the pain better in my thumb and able to use it  again  in yardwork , mowing lawn and do things around the house and help my neighbors    Currently in Pain? Yes    Pain Score 1     Pain Location Hand    Pain Orientation Right    Pain Descriptors / Indicators Sore    Pain Type Surgical pain    Pain Onset More than a month ago    Pain Frequency Intermittent                          OT Treatments/Exercises (OP) - 12/06/21 0001       RUE Paraffin   Number Minutes Paraffin 8 Minutes    RUE Paraffin Location Hand    Comments prior to soft tissue , ROM , - decrease stiffenss            Pt can switch to heat alone if pain and edema is less than 2/10  Other wise cont  with Contrast 2 -3 x day  Soft tissue mobs done to webspace and graston tool for volar and dorsal forearm and wrist prior to stretches for wrist flexion , ext   AAROM and PROM by OT for wrist flexion and extention- pt cont to be tight  Done CPM for wrist flexion, extention 200 sec - done great  Did in clinic 1 lbs for wrist in all planes 12 reps pain free    Ed on thumb PA and RA - reinforce RA on table on paper to slide - ? If pt doing correctly  Review several times and hand out provided again  Done and add rubber band for PA and RA -10 reps - pt to do 1 set of 12 -  2 x  day  Opposition to  all digits and pick up 2 cm foam block but reinforce open thumb into RA every time time and making OVAL  8 reps - pt do hyper extend out of MC of thumb   Pt to slow down and quality movement more than quantity  AGAIN REINFORCE review AAROM FOR wrist flexion, ext , RD<,UD over edge of table - pt to cont with at home    Keep pain under 1-2/10  And keep splint on in between HEP and around grand daughter but fitted with thumb CMC neoprene to use inbetween during day hour to 2 hrs at time to support [facility] and maybe prevent hyper extention of MC during PA and RA           OT Education - 12/06/21 0919     Education Details progress and changes to HEP    Person(s) Educated Patient    Methods Explanation;Demonstration;Verbal cues;Tactile cues;Handout    Comprehension Verbal cues required;Returned demonstration;Verbalized understanding              OT Short Term Goals - 11/22/21 1600       OT SHORT TERM GOAL #1   Title Pt to be independent in HEP and  splint wearing  to decrease pain to tolerate HEP for thumb and wrist AROM in all planes    Baseline pain coming in 4/10 at rest and splint off - with pain medication- could not tolerate all the HEP that OT wanted to provide    Time 2    Period Weeks    Status New    Target Date 12/06/21               OT Long Term  Goals - 11/22/21 1602        OT LONG TERM GOAL #1   Title R thumb AROM increase for PA /RA and oppositon to 5th without increase pain to wean out of splint and initiate strengthening    Baseline AROM for thumb in all planes decrease - splint thumb spica - 3 wks s/p and pain rest 4/10    Time 3    Status New    Target Date 12/13/21      OT LONG TERM GOAL #2   Title R thumb strength increase to 4+/5 to pick up cup , turn doorknob    Baseline no strength- thumb spica - sterri strips off today - 3 wks s/p - pain rest 4/10 and AROM limited in all planes    Time 5    Period Weeks    Status New    Target Date 12/27/21      OT LONG TERM GOAL #3   Title R wrist AROM and strength increase to WNL to push up from chair , do bathing and dressing with not issues    Baseline wrist AROM decrease in all planes and some pain with RD and flexion, ext -4 /10 pain    Time 6    Period Weeks    Status New    Target Date 01/03/22      OT LONG TERM GOAL #4   Title R grip and prehension strength increase for pt to hold plate , pull and push door and carry more than 8 lbs    Baseline NT - 3 wks s/p - pain at rest 4/10 - decrease AROM in wrist and thumb    Time 9    Period Weeks    Status New    Target Date 01/24/22                   Plan - 12/06/21 0920     Clinical Impression Statement Pt  s/p R thumb CMC arthroplasty on 11/01/21 and now 5 wks s/p - making good progress in pain , AROM and initiated this date strength for thumb PA and RA - wrist flexion, ext stiff -did do CPM today and review with pt stretches. ? follow thru with carry over of HEP correctly for thumb  PA and RA and wrist  . Pt hyper extending thumb MC  from [facility] - Pt to focus on quality more than quantity during HEP - pt very fast and impulsive - did add rubber band for PA and RA of thumb  - and done in clinic 1 lbs for wrist in all planes - pain was 1/10 - cont to upgrade HEP - pt to wear thumb spica when out and about    OT Occupational Profile and  History Problem Focused Assessment - Including review of records relating to presenting problem    Occupational performance deficits (Please refer to evaluation for details): ADL's;IADL's;Leisure;Social Participation    Body Structure / Function / Physical Skills ADL;Strength;Dexterity;Pain;Edema;IADL;ROM;Flexibility;UE functional use    Rehab Potential Good    Clinical Decision Making Limited treatment options, no task modification necessary    Comorbidities Affecting Occupational Performance: None    Modification or Assistance to Complete Evaluation  No modification of tasks or assist necessary to complete eval    OT Frequency --   1-2 x wk   OT Duration 8 weeks    OT Treatment/Interventions Self-care/ADL training;Splinting;Therapeutic activities;Contrast Bath;Therapeutic exercise;Paraffin;Manual Therapy;Patient/family education;Passive range of motion;Scar mobilization;Fluidtherapy  Consulted and Agree with Plan of Care Patient             Patient will benefit from skilled therapeutic intervention in order to improve the following deficits and impairments:   Body Structure / Function / Physical Skills: ADL, Strength, Dexterity, Pain, Edema, IADL, ROM, Flexibility, UE functional use       Visit Diagnosis: Pain in right hand  Stiffness of right hand, not elsewhere classified  Stiffness of right wrist, not elsewhere classified  Muscle weakness (generalized)    Problem List Patient Active Problem List   Diagnosis Date Noted   Acute bilateral low back pain without sciatica 06/21/2021   Stage 3b chronic kidney disease (CKD) ([place]) 05/31/2021   Intermittent claudication ([place]) 02/25/2021   Overweight 01/22/2020   Spondylosis of lumbar region without myelopathy or radiculopathy 03/08/2016   Arteriosclerosis of coronary artery 06/04/2015   S/P coronary artery stent placement 06/04/2015   CAD in native artery 06/04/2015   Hypertension associated with diabetes ([place]) 05/28/2015    Hyperlipidemia associated with type 2 diabetes mellitus ([place]) 05/28/2015   Insomnia 05/28/2015   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, [place], [address] Phone: [phone]   Fax:  [phone]  Name: [name] MRN: [id number] Date of Birth: June 19, 1951

## 2021-12-07 NOTE — Progress Notes (Signed)
Established patient visit   Patient: [name]   DOB: Jun 06, 1951   71 y.o. Male  MRN: [id number] Visit Date: 12/08/2021  Today's healthcare provider: Lavon Paganini, MD   Chief Complaint  Patient presents with   Diabetes   I,[name],acting as a scribe for Lavon Paganini, MD.,have documented all relevant documentation on the behalf of Lavon Paganini, MD,as directed by  Lavon Paganini, MD while in the presence of Lavon Paganini, MD.  Subjective    HPI  Diabetes Mellitus Type II, follow-up  Lab Results  Component Value Date   HGBA1C 8.1 07/18/2021   HGBA1C 7.5 (A) 05/31/2021   HGBA1C 7.7 (H) 02/25/2021   Last seen for diabetes 3 months ago.  Management since then includes continuing the same treatment. F/B endo recent A1c 07/18/21 8.1 He reports excellent compliance with treatment. He is not having side effects.   Home blood sugar records: fasting range: 120-140s  Episodes of hypoglycemia? No    Current insulin regiment: none Most Recent Eye Exam: UTD  --------------------------------------------------------------------------------------------------- Hypertension, follow-up  BP Readings from Last 3 Encounters:  12/08/21 116/86  11/01/21 (!) 112/99  09/02/21 128/80   Wt Readings from Last 3 Encounters:  12/08/21 198 lb 9.6 oz (90.1 kg)  11/01/21 196 lb (88.9 kg)  10/20/21 198 lb (89.8 kg)     He was last seen for hypertension 3 months ago.  BP at that visit was 115/76. Management since that visit includes no changes. He reports excellent compliance with treatment. He is not having side effects.  He is not exercising. He is adherent to low salt diet.   Outside blood pressures are stable, not being checked.  He does not smoke.  Use of agents associated with hypertension: none.   --------------------------------------------------------------------------------------------------- Lipid/Cholesterol, follow-up  Last Lipid  Panel: Lab Results  Component Value Date   CHOL 147 02/25/2021   LDLCALC 86 02/25/2021   HDL 25 (L) 02/25/2021   TRIG 214 (H) 02/25/2021    He was last seen for this 3 months ago.  Management since that visit includes no changes continue statin and zetia and fenofibrates.  He reports excellent compliance with treatment. He is not having side effects.   Symptoms: No appetite changes No foot ulcerations  No chest pain No chest pressure/discomfort  No dyspnea No orthopnea  No fatigue No lower extremity edema  No palpitations No paroxysmal nocturnal dyspnea  No nausea No numbness or tingling of extremity  No polydipsia No polyuria  No speech difficulty No syncope   He is following a Low Sodium diet. Current exercise: walking  Last metabolic panel Lab Results  Component Value Date   GLUCOSE 114 (H) 10/21/2021   NA 138 10/21/2021   K 3.5 10/21/2021   BUN 23 10/21/2021   CREATININE 1.35 (H) 10/21/2021   EGFR 59 (L) 04/15/2021   GFRNONAA 56 (L) 10/21/2021   CALCIUM 9.4 10/21/2021   AST 24 02/25/2021   ALT 28 02/25/2021   The 10-year ASCVD risk score (Arnett DK, et al., 2019) is: 37.9%  --------------------------------------------------------------------------------------------------- Follow up for spondylosis of lumbar region  The patient was last seen for this 3 months ago. Changes made at last visit include continue Norco 5-$RemoveBeforeDE'325mg'IsWaNFRnBrHjPcL$  .  He reports excellent compliance with treatment. He feels that condition is Unchanged. He is not having side effects.   -----------------------------------------------------------------------------------------   Medications: Outpatient Medications Prior to Visit  Medication Sig   acetaminophen (TYLENOL) 500 MG tablet Take 1,000 mg by  mouth every 6 (six) hours as needed for mild pain or moderate pain.   amLODipine (NORVASC) 5 MG tablet Take 1 tablet (5 mg total) by mouth daily.   aspirin 81 MG tablet Take 81 mg by mouth daily.     atorvastatin (LIPITOR) 80 MG tablet TAKE ONE (1) TABLET EACH DAY   blood glucose meter kit and supplies KIT Dispense based on patient and insurance preference. Check sugars once daily   Cholecalciferol (VITAMIN D3 PO) Take 1 tablet by mouth daily.   clopidogrel (PLAVIX) 75 MG tablet TAKE 1 TABLET(75 MG) BY MOUTH DAILY   DULoxetine (CYMBALTA) 20 MG capsule Take 20 mg by mouth daily.   ezetimibe (ZETIA) 10 MG tablet Take 1 tablet (10 mg total) by mouth daily.   fenofibrate (TRICOR) 145 MG tablet TAKE 1 TABLET(145 MG) BY MOUTH DAILY   furosemide (LASIX) 20 MG tablet Take 20 mg by mouth daily.   glucose blood (ONETOUCH VERIO) test strip TEST BLOOD GLUCOSE EVERY DAY   hydrochlorothiazide (HYDRODIURIL) 25 MG tablet TAKE 1 TABLET(25 MG) BY MOUTH DAILY   JARDIANCE 25 MG TABS tablet TAKE 1 TABLET(25 MG) BY MOUTH DAILY   Lidocaine 4 % PTCH Apply 1 patch topically daily as needed (pain).   Liniments (BEN GAY EX) Apply 1 application topically daily as needed (pain).   lisinopril (ZESTRIL) 20 MG tablet Take 1 tablet (20 mg total) by mouth daily.   metFORMIN (GLUCOPHAGE) 1000 MG tablet TAKE 1 TABLET BY MOUTH TWICE DAILY WITH FOOD   metoprolol tartrate (LOPRESSOR) 50 MG tablet TAKE 1 TABLET(50 MG) BY MOUTH TWICE DAILY   pantoprazole (PROTONIX) 40 MG tablet TAKE 1 TABLET BY MOUTH EVERY DAY AS NEEDED FOR HEARTBURN OR ACID REFLUX   tamsulosin (FLOMAX) 0.4 MG CAPS capsule Take 0.4 mg by mouth daily.   [DISCONTINUED] Dulaglutide (TRULICITY) 4.5 PY/0.9XI SOPN Inject 4.5 mg as directed once a week. (Patient taking differently: Inject 4.5 mg as directed once a week. tuesday)   [DISCONTINUED] HYDROcodone-acetaminophen (NORCO) 5-325 MG tablet Take 1-2 tablets by mouth every 4 (four) hours as needed for moderate pain or severe pain.   [DISCONTINUED] sitaGLIPtin (JANUVIA) 100 MG tablet Take 100 mg by mouth daily.   [DISCONTINUED] cyclobenzaprine (FLEXERIL) 5 MG tablet Take 1 tablet (5 mg total) by mouth 3 (three) times  daily as needed for muscle spasms. (Patient not taking: Reported on 10/18/2021)   No facility-administered medications prior to visit.    Review of Systems per HPI     Objective    BP 116/86 (BP Location: Left Arm, Patient Position: Sitting, Cuff Size: Large)    Pulse 69    Temp 98.3 F (36.8 C) (Temporal)    Resp 16    Wt 198 lb 9.6 oz (90.1 kg)    SpO2 99%    BMI 30.20 kg/m  {Show previous vital signs (optional):23777}  Physical Exam Vitals reviewed.  Constitutional:      General: He is not in acute distress.    Appearance: Normal appearance. He is not diaphoretic.  HENT:     Head: Normocephalic and atraumatic.  Eyes:     General: No scleral icterus.    Conjunctiva/sclera: Conjunctivae normal.  Cardiovascular:     Rate and Rhythm: Normal rate and regular rhythm.     Pulses: Normal pulses.     Heart sounds: Normal heart sounds. No murmur heard. Pulmonary:     Effort: Pulmonary effort is normal. No respiratory distress.     Breath sounds: Normal breath  sounds. No wheezing or rhonchi.  Abdominal:     General: There is no distension.     Palpations: Abdomen is soft.     Tenderness: There is no abdominal tenderness.  Musculoskeletal:     Cervical back: Neck supple.     Right lower leg: No edema.     Left lower leg: No edema.  Lymphadenopathy:     Cervical: No cervical adenopathy.  Skin:    General: Skin is warm and dry.     Capillary Refill: Capillary refill takes less than 2 seconds.     Findings: No rash.  Neurological:     Mental Status: He is alert and oriented to person, place, and time.     Cranial Nerves: No cranial nerve deficit.  Psychiatric:        Mood and Affect: Mood normal.        Behavior: Behavior normal.      Results for orders placed or performed in visit on 12/08/21  Hemoglobin A1c  Result Value Ref Range   Hemoglobin A1C 8.1     Assessment & Plan     Problem List Items Addressed This Visit       Cardiovascular and Mediastinum    Hypertension associated with diabetes ([place])    Well controlled Continue current medications Recheck metabolic panel F/u in 6 months       Relevant Medications   Dulaglutide (TRULICITY) 4.5 XM/4.6OE SOPN   Other Relevant Orders   Comprehensive metabolic panel     Endocrine   Hyperlipidemia associated with type 2 diabetes mellitus (HCC)    Continue statin, zetia, fenofibrates Repeat FLP and CMP Goal LDL < 70       Relevant Medications   Dulaglutide (TRULICITY) 4.5 HO/1.2YQ SOPN   Other Relevant Orders   Comprehensive metabolic panel   Lipid panel   T2DM (type 2 diabetes mellitus) ([place]) - Primary    Previously uncontrolled Back on trulicity - no longer on backorder Recheck A1c today Stop [place] as he does not need to take DPP4 and GLP1 together (unclear who as Rx'ing DPP4) ROI sent for eye exam F/u in 39m      Relevant Medications   Dulaglutide (TRULICITY) 4.5 MG/5.0IB SOPN   Other Relevant Orders   Hemoglobin A1c     Musculoskeletal and Integument   Spondylosis of lumbar region without myelopathy or radiculopathy    Chronic and stable Refilled pain meds      Relevant Medications   HYDROcodone-acetaminophen (NORCO) 5-325 MG tablet     Genitourinary   Stage 3b chronic kidney disease (CKD) (HCC)    F/b Nephrology Chronic and stable Recheck metabolic panel Avoid nephrotoxic meds       Relevant Orders   Comprehensive metabolic panel     Other   Obesity    Discussed importance of healthy weight management Discussed diet and exercise       Relevant Medications   Dulaglutide (TRULICITY) 4.5 BC/4.8GQ SOPN     Return in about 3 months (around 03/07/2022) for chronic disease f/u.      I, Lavon Paganini, MD, have reviewed all documentation for this visit. The documentation on 12/08/21 for the exam, diagnosis, procedures, and orders are all accurate and complete.   Teagen Bucio, Dionne Bucy, MD, MPH [facility]

## 2021-12-08 ENCOUNTER — Encounter: Payer: Self-pay | Admitting: Family Medicine

## 2021-12-08 ENCOUNTER — Other Ambulatory Visit: Payer: Self-pay

## 2021-12-08 ENCOUNTER — Ambulatory Visit (INDEPENDENT_AMBULATORY_CARE_PROVIDER_SITE_OTHER): Payer: No Typology Code available for payment source | Admitting: Family Medicine

## 2021-12-08 ENCOUNTER — Ambulatory Visit: Payer: No Typology Code available for payment source | Admitting: Occupational Therapy

## 2021-12-08 VITALS — BP 116/86 | HR 69 | Temp 98.3°F | Resp 16 | Wt 198.6 lb

## 2021-12-08 DIAGNOSIS — M25641 Stiffness of right hand, not elsewhere classified: Secondary | ICD-10-CM

## 2021-12-08 DIAGNOSIS — M79641 Pain in right hand: Secondary | ICD-10-CM

## 2021-12-08 DIAGNOSIS — I152 Hypertension secondary to endocrine disorders: Secondary | ICD-10-CM | POA: Diagnosis not present

## 2021-12-08 DIAGNOSIS — M25631 Stiffness of right wrist, not elsewhere classified: Secondary | ICD-10-CM

## 2021-12-08 DIAGNOSIS — E785 Hyperlipidemia, unspecified: Secondary | ICD-10-CM | POA: Diagnosis not present

## 2021-12-08 DIAGNOSIS — E669 Obesity, unspecified: Secondary | ICD-10-CM | POA: Diagnosis not present

## 2021-12-08 DIAGNOSIS — N1832 Chronic kidney disease, stage 3b: Secondary | ICD-10-CM | POA: Diagnosis not present

## 2021-12-08 DIAGNOSIS — E1169 Type 2 diabetes mellitus with other specified complication: Secondary | ICD-10-CM | POA: Diagnosis not present

## 2021-12-08 DIAGNOSIS — M6281 Muscle weakness (generalized): Secondary | ICD-10-CM

## 2021-12-08 DIAGNOSIS — M47816 Spondylosis without myelopathy or radiculopathy, lumbar region: Secondary | ICD-10-CM | POA: Diagnosis not present

## 2021-12-08 DIAGNOSIS — E1159 Type 2 diabetes mellitus with other circulatory complications: Secondary | ICD-10-CM

## 2021-12-08 DIAGNOSIS — E66811 Obesity, class 1: Secondary | ICD-10-CM

## 2021-12-08 DIAGNOSIS — Z6830 Body mass index (BMI) 30.0-30.9, adult: Secondary | ICD-10-CM | POA: Diagnosis not present

## 2021-12-08 MED ORDER — TRULICITY 4.5 MG/0.5ML ~~LOC~~ SOAJ: 4.5000 mg | SUBCUTANEOUS | 3 refills | Status: DC

## 2021-12-08 MED ORDER — HYDROCODONE-ACETAMINOPHEN 5-325 MG PO TABS: 1.0000 | ORAL_TABLET | Freq: Four times a day (QID) | ORAL | 0 refills | Status: DC | PRN

## 2021-12-08 NOTE — Therapy (Signed)
[facility] [address], [place], [address] Phone: [phone]   Fax:  [phone]  Occupational Therapy Treatment  Patient Details  Name: [name] MRN: [id number] Date of Birth: 1951/06/15 Referring Provider (OT): Dr Rudene Christians   Encounter Date: 12/08/2021   OT End of Session - 12/08/21 1325     Visit Number 5    Number of Visits 16    Date for OT Re-Evaluation 01/24/22    OT Start Time 1208    OT Stop Time 1249    OT Time Calculation (min) 41 min    Activity Tolerance Patient tolerated treatment well    Behavior During Therapy [facility] for tasks assessed/performed             Past Medical History:  Diagnosis Date   Arthritis    back   Cataracts, bilateral    a.) s/p extractions in 2018   Coronary artery disease 04/08/2008   a.)  LHC 04/08/2008: EF 48%; 75% stenosis mRCA --> PCI performed placing a 3.5 x 18 mm Xience V DES x1.   Depression    GERD (gastroesophageal reflux disease)    Glaucoma    Hyperlipidemia    Hypertension    Stage 3b chronic kidney disease ([place])    T2DM (type 2 diabetes mellitus) ([place])     Past Surgical History:  Procedure Laterality Date   CAROTID STENT     CARPOMETACARPAL ([place]) FUSION OF THUMB Right 11/01/2021   Procedure: CARPOMETACARPAL [facility]) FUSION OF THUMB;  Surgeon: Hessie Knows, MD;  Location: [facility] ORS;  Service: Orthopedics;  Laterality: Right;   CATARACT EXTRACTION W/PHACO Right 03/01/2017   Procedure: CATARACT EXTRACTION PHACO AND INTRAOCULAR LENS PLACEMENT (IOC);  Surgeon: Eulogio Bear, MD;  Location: [facility];  Service: Ophthalmology;  Laterality: Right;  [place] 22.6 AP% 6.1 CDE 1.37 Fluid pack lot # [id number]   CATARACT EXTRACTION W/PHACO Left 05/01/2017   Procedure: CATARACT EXTRACTION PHACO AND INTRAOCULAR LENS PLACEMENT (IOC)  Left Diabetic;  Surgeon: Eulogio Bear, MD;  Location: [place];  Service: Ophthalmology;  Laterality: Left;   Block Diabetic   CORONARY ANGIOPLASTY WITH STENT PLACEMENT Left 04/08/2008   Procedure: CORONARY ANGIOPLASTY WITH STENT PLACEMENT (3.5 x 18 mm Xience V DES x1 to mRCA); Location: [place]; Surgeon: Jonetta Speak, MD   POSTERIOR CERVICAL LAMINECTOMY N/A 02/25/2018   Procedure: POSTERIOR CERVICAL LAMINECTOMY-CERVICAL 3;  Surgeon: Kathlene November, MD;  Location: [facility];  Service: Neurosurgery;  Laterality: N/A;    There were no vitals filed for this visit.   Subjective Assessment - 12/08/21 1322     Subjective  I put my hard splint on when I have my great granddaughter - but otherwise I am wearing the soft one - my thumb was like this prior to my surgery -that is why Dr Rudene Christians done it    Pertinent History Pt s/p  right thumb CMC arthroplasty by Dr. Hessie Knows on 11/01/2021. Seen by PA to remove stitches on 11/15/21 - at that time Patient still having moderate pain. Swelling has significantly improved. He is taking Norco for his chronic pain but is having to take more than normal for breakthrough pain. Denies any numbness or tingling. No warmth erythema or drainage.Stitches were remove and stay in thumb spica until about 3 wks and then during day can take off and sleep still with it -refer to OT    Patient Stated Goals I want the pain better in  my thumb and able to use it again  in yardwork , mowing lawn and do things around the house and help my neighbors    Currently in Pain? Yes    Pain Score 2     Pain Location Hand    Pain Orientation Right    Pain Descriptors / Indicators Sore    Pain Type Surgical pain    Pain Onset More than a month ago    Pain Frequency Intermittent                     Digits flexion WNL - but thumb ADD at [facility] and cont to hyper extend out of MC of thumb  Wrist ext, RD, UD WNL but flexion decrease      OT Treatments/Exercises (OP) - 12/08/21 0001       RUE Paraffin   Number Minutes Paraffin 8 Minutes    RUE Paraffin Location Hand   wrist    Comments prior to soft tissue and wrist flexion strecth               Pt can switch to heat alone if pain and increase flexibility prior to ROM and stretches    Soft tissue mobs done to webspace and MC spreads by OT with wrist flexion and extention stretches    AAROM and PROM by OT for wrist flexion and extention- pt cont to be tight in flexion Done CPM for wrist flexion, extention 200 sec - done great  1 lbs or 16oz hammer for wrist in all planes - 12 reps pain free  Needed mod A - hand out and review and repeat 3 x to follow hand out and demo  Can do 2 x day after stretches    Cont thumb PA and RA - reinforce RA on table on paper to slide  Review several times and hand out provided again last time add rubber band for PA and RA  last time -10 reps - pt to do 1 set of 12 -  2 x  day  Review again - needed mod A for RA to do correctly  Opposition to  all digits and pick up 2 cm foam block but reinforce open thumb into RA every time time and making OVAL  8 reps - pt do hyper extend out of MC of thumb since [place]   Pt to slow down and quality movement more than quantity  AGAIN REINFORCE    Keep pain under 1-2/10  And keep splint on in between HEP and around grand daughter but thumb CMC neoprene to use inbetween during day to support [facility] and maybe prevent hyper extention of MC during PA and RA          OT Education - 12/08/21 1325     Education Details progress and changes to HEP    Person(s) Educated Patient    Methods Explanation;Demonstration;Verbal cues;Tactile cues;Handout    Comprehension Verbal cues required;Returned demonstration;Verbalized understanding              OT Short Term Goals - 11/22/21 1600       OT SHORT TERM GOAL #1   Title Pt to be independent in HEP and  splint wearing  to decrease pain to tolerate HEP for thumb and wrist AROM in all planes    Baseline pain coming in 4/10 at rest and splint off - with pain medication- could not tolerate all the  HEP that OT wanted to provide  Time 2    Period Weeks    Status New    Target Date 12/06/21               OT Long Term Goals - 11/22/21 1602       OT LONG TERM GOAL #1   Title R thumb AROM increase for PA /RA and oppositon to 5th without increase pain to wean out of splint and initiate strengthening    Baseline AROM for thumb in all planes decrease - splint thumb spica - 3 wks s/p and pain rest 4/10    Time 3    Status New    Target Date 12/13/21      OT LONG TERM GOAL #2   Title R thumb strength increase to 4+/5 to pick up cup , turn doorknob    Baseline no strength- thumb spica - sterri strips off today - 3 wks s/p - pain rest 4/10 and AROM limited in all planes    Time 5    Period Weeks    Status New    Target Date 12/27/21      OT LONG TERM GOAL #3   Title R wrist AROM and strength increase to WNL to push up from chair , do bathing and dressing with not issues    Baseline wrist AROM decrease in all planes and some pain with RD and flexion, ext -4 /10 pain    Time 6    Period Weeks    Status New    Target Date 01/03/22      OT LONG TERM GOAL #4   Title R grip and prehension strength increase for pt to hold plate , pull and push door and carry more than 8 lbs    Baseline NT - 3 wks s/p - pain at rest 4/10 - decrease AROM in wrist and thumb    Time 9    Period Weeks    Status New    Target Date 01/24/22                   Plan - 12/08/21 1325     Clinical Impression Statement Pt  s/p R thumb CMC arthroplasty on 11/01/21 and now 5 1/2 wks s/p - making good progress in pain , AROM - but cont to have thumb CMC into ADD from [facility] - pt report his thumb was like that prior to surgery - Initiated last session strength for thumb PA and RA. Review again with pt and needed mod A to do correctly. Wrist flexion still decrease done again this date CPM and review with pt stretches. ? follow thru with carry over of HEP correctly for thumb  PA and RA and wrist flexion . Pt  cont to  hyper extending thumb MC during RA. Initiated 1 lbs weight for wrist in all planes - review and hand  out provided and reviewed 3 x. Pt to focus on quality more than quantity during HEP - pt very fast and impulsive - pt to slow down. Cont to upgrade HEP to tolerance - pt to wear thumb spica when out and about and with great grand daughter otherwise doing Soft CMC neoprene    OT Occupational Profile and History Problem Focused Assessment - Including review of records relating to presenting problem    Occupational performance deficits (Please refer to evaluation for details): ADL's;IADL's;Leisure;Social Participation    Body Structure / Function / Physical Skills ADL;Strength;Dexterity;Pain;Edema;IADL;ROM;Flexibility;UE functional use    Rehab Potential Good  Clinical Decision Making Limited treatment options, no task modification necessary    Comorbidities Affecting Occupational Performance: None    Modification or Assistance to Complete Evaluation  No modification of tasks or assist necessary to complete eval    OT Frequency --   1-2 x wk   OT Duration 8 weeks    OT Treatment/Interventions Self-care/ADL training;Splinting;Therapeutic activities;Contrast Bath;Therapeutic exercise;Paraffin;Manual Therapy;Patient/family education;Passive range of motion;Scar mobilization;Fluidtherapy    Consulted and Agree with Plan of Care Patient             Patient will benefit from skilled therapeutic intervention in order to improve the following deficits and impairments:   Body Structure / Function / Physical Skills: ADL, Strength, Dexterity, Pain, Edema, IADL, ROM, Flexibility, UE functional use       Visit Diagnosis: Pain in right hand  Stiffness of right wrist, not elsewhere classified  Muscle weakness (generalized)  Stiffness of right hand, not elsewhere classified    Problem List Patient Active Problem List   Diagnosis Date Noted   Stage 3b chronic kidney disease (CKD) ([place])  05/31/2021   Intermittent claudication ([place]) 02/25/2021   Obesity 01/22/2020   Spondylosis of lumbar region without myelopathy or radiculopathy 03/08/2016   Arteriosclerosis of coronary artery 06/04/2015   S/P coronary artery stent placement 06/04/2015   CAD in native artery 06/04/2015   Hypertension associated with diabetes ([place]) 05/28/2015   Hyperlipidemia associated with type 2 diabetes mellitus ([place]) 05/28/2015   Insomnia 05/28/2015   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 [place], [address] Phone: [phone]   Fax:  [phone]  Name: [name] MRN: [id number] Date of Birth: [birth year]

## 2021-12-08 NOTE — Assessment & Plan Note (Signed)
Well controlled Continue current medications Recheck metabolic panel F/u in 6 months  

## 2021-12-08 NOTE — Assessment & Plan Note (Signed)
Chronic and stable Refilled pain meds 

## 2021-12-08 NOTE — Assessment & Plan Note (Signed)
Previously uncontrolled Back on trulicity - no longer on backorder Recheck A1c today Stop [place] as he does not need to take DPP4 and GLP1 together (unclear who as Rx'ing DPP4) ROI sent for eye exam F/u in 26m

## 2021-12-08 NOTE — Assessment & Plan Note (Signed)
Discussed importance of healthy weight management Discussed diet and exercise  

## 2021-12-08 NOTE — Assessment & Plan Note (Signed)
F/b Nephrology °Chronic and stable °Recheck metabolic panel °Avoid nephrotoxic meds  °

## 2021-12-08 NOTE — Assessment & Plan Note (Signed)
Continue statin, zetia, fenofibrates Repeat FLP and CMP Goal LDL < 70

## 2021-12-09 LAB — LIPID PANEL
Chol/HDL Ratio: 5.4 ratio — ABNORMAL HIGH (ref 0.0–5.0)
Cholesterol, Total: 145 mg/dL (ref 100–199)
HDL: 27 mg/dL — ABNORMAL LOW (ref >39)
LDL Chol Calc (NIH): 92 mg/dL (ref 0–99)
Triglycerides: 147 mg/dL (ref 0–149)
VLDL Cholesterol Cal: 26 mg/dL (ref 5–40)

## 2021-12-09 LAB — HEMOGLOBIN A1C
Est. average glucose Bld gHb Est-mCnc: 171 mg/dL
Hgb A1c MFr Bld: 7.6 % — ABNORMAL HIGH (ref 4.8–5.6)

## 2021-12-09 LAB — COMPREHENSIVE METABOLIC PANEL
ALT: 21 IU/L (ref 0–44)
AST: 23 IU/L (ref 0–40)
Albumin/Globulin Ratio: 2.2 (ref 1.2–2.2)
Albumin: 4.6 g/dL (ref 3.7–4.7)
Alkaline Phosphatase: 36 IU/L — ABNORMAL LOW (ref 44–121)
BUN/Creatinine Ratio: 20 (ref 10–24)
BUN: 22 mg/dL (ref 8–27)
Bilirubin Total: 0.4 mg/dL (ref 0.0–1.2)
CO2: 21 mmol/L (ref 20–29)
Calcium: 10 mg/dL (ref 8.6–10.2)
Chloride: 101 mmol/L (ref 96–106)
Creatinine, Ser: 1.11 mg/dL (ref 0.76–1.27)
Globulin, Total: 2.1 g/dL (ref 1.5–4.5)
Glucose: 114 mg/dL — ABNORMAL HIGH (ref 70–99)
Potassium: 4.4 mmol/L (ref 3.5–5.2)
Sodium: 137 mmol/L (ref 134–144)
Total Protein: 6.7 g/dL (ref 6.0–8.5)
eGFR: 71 mL/min/{1.73_m2} (ref >59)

## 2021-12-12 ENCOUNTER — Other Ambulatory Visit: Payer: Self-pay | Admitting: Family Medicine

## 2021-12-12 MED ORDER — HYDROCODONE-ACETAMINOPHEN 5-325 MG PO TABS: 1.0000 | ORAL_TABLET | Freq: Four times a day (QID) | ORAL | 0 refills | Status: DC | PRN

## 2021-12-12 NOTE — Telephone Encounter (Signed)
Pts daughter was told to call office when they found a pharmacy that has HYDROcodone-acetaminophen (NORCO) 5-325 MG tablet/ [facility] doesn't have any in stock / pt has found that  [id number] [place], [place] - [address] AT [facility] OF [address] [facility] Phone:  [phone]  Fax:  [phone]    Has them available and would like refill resent to them / please advise

## 2021-12-13 ENCOUNTER — Other Ambulatory Visit: Payer: Self-pay

## 2021-12-13 ENCOUNTER — Ambulatory Visit: Payer: No Typology Code available for payment source | Admitting: Occupational Therapy

## 2021-12-13 DIAGNOSIS — M6281 Muscle weakness (generalized): Secondary | ICD-10-CM

## 2021-12-13 DIAGNOSIS — M25641 Stiffness of right hand, not elsewhere classified: Secondary | ICD-10-CM

## 2021-12-13 DIAGNOSIS — M79641 Pain in right hand: Secondary | ICD-10-CM | POA: Diagnosis not present

## 2021-12-13 DIAGNOSIS — M25631 Stiffness of right wrist, not elsewhere classified: Secondary | ICD-10-CM

## 2021-12-13 NOTE — Therapy (Signed)
[facility] [address], [place], [address] Phone: [phone]   Fax:  [phone]  Occupational Therapy Treatment  Patient Details  Name: [name] MRN: [id number] Date of Birth: [birth year] Referring Provider (OT): Dr Rudene Christians   Encounter Date: 12/13/2021   OT End of Session - 12/13/21 1157     Visit Number 6    Number of Visits 16    Date for OT Re-Evaluation 01/24/22    OT Start Time 1157    OT Stop Time [id number]    OT Time Calculation (min) 38 min    Activity Tolerance Patient tolerated treatment well    Behavior During Therapy [facility] for tasks assessed/performed             Past Medical History:  Diagnosis Date   Arthritis    back   Cataracts, bilateral    a.) s/p extractions in 2018   Coronary artery disease 04/08/2008   a.)  LHC 04/08/2008: EF 48%; 75% stenosis mRCA --> PCI performed placing a 3.5 x 18 mm Xience V DES x1.   Depression    GERD (gastroesophageal reflux disease)    Glaucoma    Hyperlipidemia    Hypertension    Stage 3b chronic kidney disease ([place])    T2DM (type 2 diabetes mellitus) ([place])     Past Surgical History:  Procedure Laterality Date   CAROTID STENT     CARPOMETACARPAL ([place]) FUSION OF THUMB Right 11/01/2021   Procedure: CARPOMETACARPAL [facility]) FUSION OF THUMB;  Surgeon: Hessie Knows, MD;  Location: [facility];  Service: Orthopedics;  Laterality: Right;   CATARACT EXTRACTION W/PHACO Right 03/01/2017   Procedure: CATARACT EXTRACTION PHACO AND INTRAOCULAR LENS PLACEMENT (IOC);  Surgeon: Eulogio Bear, MD;  Location: [facility];  Service: Ophthalmology;  Laterality: Right;  [place] 22.6 AP% 6.1 CDE 1.37 Fluid pack lot # [id number]   CATARACT EXTRACTION W/PHACO Left 05/01/2017   Procedure: CATARACT EXTRACTION PHACO AND INTRAOCULAR LENS PLACEMENT (IOC)  Left Diabetic;  Surgeon: Eulogio Bear, MD;  Location: [place];  Service: Ophthalmology;  Laterality: Left;   Block Diabetic   CORONARY ANGIOPLASTY WITH STENT PLACEMENT Left 04/08/2008   Procedure: CORONARY ANGIOPLASTY WITH STENT PLACEMENT (3.5 x 18 mm Xience V DES x1 to mRCA); Location: [place]; Surgeon: Jonetta Speak, MD   POSTERIOR CERVICAL LAMINECTOMY N/A 02/25/2018   Procedure: POSTERIOR CERVICAL LAMINECTOMY-CERVICAL 3;  Surgeon: Kathlene November, MD;  Location: [facility];  Service: Neurosurgery;  Laterality: N/A;    There were no vitals filed for this visit.   Subjective Assessment - 12/13/21 1157     Subjective  No pain - done my exercises - try and do it when my great granddaughter is not around -where my hard splint around her - wearing it less than 50% of the time    Pertinent History Pt s/p  right thumb [facility] arthroplasty by Dr. Hessie Knows on 11/01/2021. Seen by PA to remove stitches on 11/15/21 - at that time Patient still having moderate pain. Swelling has significantly improved. He is taking Norco for his chronic pain but is having to take more than normal for breakthrough pain. Denies any numbness or tingling. No warmth erythema or drainage.Stitches were remove and stay in thumb spica until about 3 wks and then during day can take off and sleep still with it -refer to OT    Patient Stated Goals I want the pain better in my thumb and able to  use it again  in yardwork , mowing lawn and do things around the house and help my neighbors    Currently in Pain? No/denies                [facility] OT Assessment - 12/13/21 0001       AROM   Right Wrist Extension 70 Degrees    Right Wrist Flexion 40 Degrees      Strength   Right Hand Grip (lbs) 30    Right Hand Lateral Pinch 7 lbs    Right Hand 3 Point Pinch 6 lbs    Left Hand Grip (lbs) 80    Left Hand Lateral Pinch 15 lbs    Left Hand 3 Point Pinch 13 lbs                      OT Treatments/Exercises (OP) - 12/13/21 0001       RUE Paraffin   Number Minutes Paraffin 8 Minutes    RUE Paraffin Location Hand     Comments prior to soft tissue and flexion wrist stretch             Pt can switch to heat alone if pain and increase flexibility prior to ROM and stretches    Soft tissue mobs done to webspace and MC spreads by OT with wrist flexion stretches    AAROM and PROM by OT for wrist flexion and extention- pt cont to be tight in flexion Review 1 lbs or 16oz hammer for wrist in all planes -20  reps pain free  Needed min A - hand out and review and repeat 3 x to follow hand out and demo  Can increase to 2 x 20 reps 2 x day after stretches     Cont thumb PA and RA - reinforce RA on table on paper to slide  Review several times and hand out provided again last time rubber band for PA and RA  last time -10 reps - pt to do 2 set of 12 -  2 x  day  Review again - needed mod A for RA to do correctly  Opposition to  all digits and pick up 2 cm foam block but reinforce open thumb into RA every time time and making OVAL  8 reps - pt do hyper extend out of MC of thumb since [facility] Add and review putty teal med - for gripping 20 reps 2 x day - pain free  HOLD of on prehension    Pt to slow down and quality movement more than quantity  AGAIN REINFORCE    Keep pain under 1-2/10  And keep splint on around grand daughter but thumb CMC neoprene to use inbetween during day to support [facility] and maybe prevent hyper extention of MC during PA and RA        OT Education - 12/13/21 1157     Education Details progress and changes to HEP    Person(s) Educated Patient    Methods Explanation;Demonstration;Verbal cues;Tactile cues;Handout    Comprehension Verbal cues required;Returned demonstration;Verbalized understanding              OT Short Term Goals - 11/22/21 1600       OT SHORT TERM GOAL #1   Title Pt to be independent in HEP and  splint wearing  to decrease pain to tolerate HEP for thumb and wrist AROM in all planes    Baseline pain coming in 4/10 at rest  and splint off - with pain medication-  could not tolerate all the HEP that OT wanted to provide    Time 2    Period Weeks    Status New    Target Date 12/06/21               OT Long Term Goals - 11/22/21 1602       OT LONG TERM GOAL #1   Title R thumb AROM increase for PA /RA and oppositon to 5th without increase pain to wean out of splint and initiate strengthening    Baseline AROM for thumb in all planes decrease - splint thumb spica - 3 wks s/p and pain rest 4/10    Time 3    Status New    Target Date 12/13/21      OT LONG TERM GOAL #2   Title R thumb strength increase to 4+/5 to pick up cup , turn doorknob    Baseline no strength- thumb spica - sterri strips off today - 3 wks s/p - pain rest 4/10 and AROM limited in all planes    Time 5    Period Weeks    Status New    Target Date 12/27/21      OT LONG TERM GOAL #3   Title R wrist AROM and strength increase to WNL to push up from chair , do bathing and dressing with not issues    Baseline wrist AROM decrease in all planes and some pain with RD and flexion, ext -4 /10 pain    Time 6    Period Weeks    Status New    Target Date 01/03/22      OT LONG TERM GOAL #4   Title R grip and prehension strength increase for pt to hold plate , pull and push door and carry more than 8 lbs    Baseline NT - 3 wks s/p - pain at rest 4/10 - decrease AROM in wrist and thumb    Time 9    Period Weeks    Status New    Target Date 01/24/22                   Plan - 12/13/21 1157     Clinical Impression Statement Pt  s/p R thumb CMC arthroplasty on 11/01/21 and now 6 1/2 wks s/p - making good progress in pain , AROM  and strength- but cont to have thumb CMC into ADD since [facility] - pt report his thumb was like that prior to surgery - Tolerating strength for thumb PA and RA , wrist using 16oz hammer well - This date assess grip and prehension strength- add putty for gripping this date. Wrist flexion still decrease but able to show increase flexion after paraffin with  stretch.. ? follow thru with carry over of HEP correctly for thumb  PA and RA and wrist flexion . Pt cont to  hyper extending thumb MC during RA. Review HEP  and hand  out provided and reviewed 3 x. Pt to focus on quality more than quantity during HEP - pt very fast and impulsive - pt to slow down. Cont to upgrade HEP to tolerance - pt to wear thumb spica when out and about and with great grand daughter otherwise doing Soft CMC neoprene    OT Occupational Profile and History Problem Focused Assessment - Including review of records relating to presenting problem    Occupational performance deficits (Please refer to evaluation  for details): ADL's;IADL's;Leisure;Social Participation    Body Structure / Function / Physical Skills ADL;Strength;Dexterity;Pain;Edema;IADL;ROM;Flexibility;UE functional use    Rehab Potential Good    Clinical Decision Making Limited treatment options, no task modification necessary    Comorbidities Affecting Occupational Performance: None    Modification or Assistance to Complete Evaluation  No modification of tasks or assist necessary to complete eval    OT Frequency --   1-2xwk   OT Duration 8 weeks    OT Treatment/Interventions Self-care/ADL training;Splinting;Therapeutic activities;Contrast Bath;Therapeutic exercise;Paraffin;Manual Therapy;Patient/family education;Passive range of motion;Scar mobilization;Fluidtherapy    Consulted and Agree with Plan of Care Patient             Patient will benefit from skilled therapeutic intervention in order to improve the following deficits and impairments:   Body Structure / Function / Physical Skills: ADL, Strength, Dexterity, Pain, Edema, IADL, ROM, Flexibility, UE functional use       Visit Diagnosis: Pain in right hand  Stiffness of right wrist, not elsewhere classified  Muscle weakness (generalized)  Stiffness of right hand, not elsewhere classified    Problem List Patient Active Problem List   Diagnosis  Date Noted   Stage 3b chronic kidney disease (CKD) ([place]) 05/31/2021   Intermittent claudication ([place]) 02/25/2021   Obesity 01/22/2020   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) 05/28/2015    Rosalyn Gess, OTR/L,CLT 12/13/2021, 12:45 PM  [facility] [address] [place], [address] Phone: [phone]   Fax:  [phone]  Name: AERYK MCIRVIN MRN: [id number] Date of Birth: 1951/08/27

## 2021-12-14 ENCOUNTER — Other Ambulatory Visit: Payer: Self-pay | Admitting: Family Medicine

## 2021-12-14 DIAGNOSIS — M1811 Unilateral primary osteoarthritis of first carpometacarpal joint, right hand: Secondary | ICD-10-CM | POA: Diagnosis not present

## 2021-12-14 NOTE — Telephone Encounter (Signed)
Requested Prescriptions  Pending Prescriptions Disp Refills   lisinopril (ZESTRIL) 20 MG tablet [Pharmacy Med Name: LISINOPRIL 20 MG TABLET] 90 tablet 1    Sig: TAKE 1 TABLET BY MOUTH DAILY     Cardiovascular:  ACE Inhibitors Passed - 12/14/2021 11:41 AM      Passed - Cr in normal range and within 180 days    Creatinine, Ser  Date Value Ref Range Status  12/08/2021 1.11 0.76 - 1.27 mg/dL Final         Passed - K in normal range and within 180 days    Potassium  Date Value Ref Range Status  12/08/2021 4.4 3.5 - 5.2 mmol/L Final         Passed - Patient is not pregnant      Passed - Last BP in normal range    BP Readings from Last 1 Encounters:  12/08/21 116/86         Passed - Valid encounter within last 6 months    Recent Outpatient Visits          6 days ago Type 2 diabetes mellitus with other specified complication, without long-term current use of insulin (HCC)   [facility] [place], Marzella Schlein, MD   3 months ago Type 2 diabetes mellitus with other specified complication, without long-term current use of insulin [facility])   [facility] [place], Marzella Schlein, MD   5 months ago Acute bilateral low back pain without sciatica   [facility] Norton T, FNP   6 months ago Type 2 diabetes mellitus with other specified complication, without long-term current use of insulin [facility])   [facility] [place], Marzella Schlein, MD   9 months ago Encounter for annual physical exam   [facility] [place], Marzella Schlein, MD      Future Appointments            In 2 months Bacigalupo, Marzella Schlein, MD [facility]

## 2021-12-15 ENCOUNTER — Ambulatory Visit: Payer: No Typology Code available for payment source | Admitting: Occupational Therapy

## 2021-12-16 ENCOUNTER — Encounter: Payer: No Typology Code available for payment source | Admitting: Occupational Therapy

## 2021-12-16 ENCOUNTER — Other Ambulatory Visit: Payer: Self-pay | Admitting: Family Medicine

## 2021-12-16 MED ORDER — LISINOPRIL 20 MG PO TABS: 20.0000 mg | ORAL_TABLET | Freq: Every day | ORAL | 1 refills | Status: DC

## 2021-12-16 NOTE — Telephone Encounter (Signed)
Requested Prescriptions  Pending Prescriptions Disp Refills   lisinopril (ZESTRIL) 20 MG tablet [Pharmacy Med Name: LISINOPRIL 20 MG TABLET] 90 tablet 1    Sig: TAKE 1 TABLET BY MOUTH DAILY     Cardiovascular:  ACE Inhibitors Passed - 12/16/2021  1:41 PM      Passed - Cr in normal range and within 180 days    Creatinine, Ser  Date Value Ref Range Status  12/08/2021 1.11 0.76 - 1.27 mg/dL Final         Passed - K in normal range and within 180 days    Potassium  Date Value Ref Range Status  12/08/2021 4.4 3.5 - 5.2 mmol/L Final         Passed - Patient is not pregnant      Passed - Last BP in normal range    BP Readings from Last 1 Encounters:  12/08/21 116/86         Passed - Valid encounter within last 6 months    Recent Outpatient Visits          1 week ago Type 2 diabetes mellitus with other specified complication, without long-term current use of insulin (HCC)   [facility] [place], Marzella Schlein, MD   3 months ago Type 2 diabetes mellitus with other specified complication, without long-term current use of insulin [facility])   [facility] [place], Marzella Schlein, MD   5 months ago Acute bilateral low back pain without sciatica   [facility] Merita Norton T, FNP   6 months ago Type 2 diabetes mellitus with other specified complication, without long-term current use of insulin [facility])   [facility] [place], Marzella Schlein, MD   9 months ago Encounter for annual physical exam   [facility] [place], Marzella Schlein, MD      Future Appointments            In 2 months Bacigalupo, Marzella Schlein, MD [facility]

## 2021-12-16 NOTE — Addendum Note (Signed)
Addended by: Wilford Corner on: 12/16/2021 04:06 PM   Modules accepted: Orders

## 2021-12-16 NOTE — Telephone Encounter (Signed)
Resubmitted due to failed transmission.

## 2021-12-20 ENCOUNTER — Other Ambulatory Visit: Payer: Self-pay

## 2021-12-20 ENCOUNTER — Ambulatory Visit: Payer: No Typology Code available for payment source | Admitting: Occupational Therapy

## 2021-12-20 DIAGNOSIS — M79641 Pain in right hand: Secondary | ICD-10-CM | POA: Diagnosis not present

## 2021-12-20 DIAGNOSIS — M25631 Stiffness of right wrist, not elsewhere classified: Secondary | ICD-10-CM

## 2021-12-20 DIAGNOSIS — M6281 Muscle weakness (generalized): Secondary | ICD-10-CM

## 2021-12-20 DIAGNOSIS — M25641 Stiffness of right hand, not elsewhere classified: Secondary | ICD-10-CM

## 2021-12-20 NOTE — Therapy (Signed)
[facility] [address], [place], [address] Phone: [phone]   Fax:  [phone]  Occupational Therapy Treatment  Patient Details  Name: [name] MRN: [id number] Date of Birth: 07-28-1951 Referring Provider (OT): Dr Rudene Christians   Encounter Date: 12/20/2021   OT End of Session - 12/20/21 1311     Visit Number 7    Number of Visits 16    Date for OT Re-Evaluation 01/24/22    OT Start Time 1116    OT Stop Time 1200    OT Time Calculation (min) 44 min    Activity Tolerance Patient tolerated treatment well    Behavior During Therapy [facility] for tasks assessed/performed             Past Medical History:  Diagnosis Date   Arthritis    back   Cataracts, bilateral    a.) s/p extractions in 2018   Coronary artery disease 04/08/2008   a.)  LHC 04/08/2008: EF 48%; 75% stenosis mRCA --> PCI performed placing a 3.5 x 18 mm Xience V DES x1.   Depression    GERD (gastroesophageal reflux disease)    Glaucoma    Hyperlipidemia    Hypertension    Stage 3b chronic kidney disease ([place])    T2DM (type 2 diabetes mellitus) ([place])     Past Surgical History:  Procedure Laterality Date   CAROTID STENT     CARPOMETACARPAL ([place]) FUSION OF THUMB Right 11/01/2021   Procedure: CARPOMETACARPAL [facility]) FUSION OF THUMB;  Surgeon: Hessie Knows, MD;  Location: [facility];  Service: Orthopedics;  Laterality: Right;   CATARACT EXTRACTION W/PHACO Right 03/01/2017   Procedure: CATARACT EXTRACTION PHACO AND INTRAOCULAR LENS PLACEMENT (IOC);  Surgeon: Eulogio Bear, MD;  Location: [facility];  Service: Ophthalmology;  Laterality: Right;  [place] 22.6 AP% 6.1 CDE 1.37 Fluid pack lot # [id number]   CATARACT EXTRACTION W/PHACO Left 05/01/2017   Procedure: CATARACT EXTRACTION PHACO AND INTRAOCULAR LENS PLACEMENT (IOC)  Left Diabetic;  Surgeon: Eulogio Bear, MD;  Location: [place];  Service: Ophthalmology;  Laterality: Left;   Block Diabetic   CORONARY ANGIOPLASTY WITH STENT PLACEMENT Left 04/08/2008   Procedure: CORONARY ANGIOPLASTY WITH STENT PLACEMENT (3.5 x 18 mm Xience V DES x1 to mRCA); Location: [place]; Surgeon: Jonetta Speak, MD   POSTERIOR CERVICAL LAMINECTOMY N/A 02/25/2018   Procedure: POSTERIOR CERVICAL LAMINECTOMY-CERVICAL 3;  Surgeon: Kathlene November, MD;  Location: [facility];  Service: Neurosurgery;  Laterality: N/A;    There were no vitals filed for this visit.   Subjective Assessment - 12/20/21 1118     Subjective  I did see DR Rudene Christians and he told me too -that I am ahead of the game and to slow down - will talk about mowing the lawn in 5 wks - pain still good    Pertinent History Pt s/p  right thumb CMC arthroplasty by Dr. Hessie Knows on 11/01/2021. Seen by PA to remove stitches on 11/15/21 - at that time Patient still having moderate pain. Swelling has significantly improved. He is taking Norco for his chronic pain but is having to take more than normal for breakthrough pain. Denies any numbness or tingling. No warmth erythema or drainage.Stitches were remove and stay in thumb spica until about 3 wks and then during day can take off and sleep still with it -refer to OT    Patient Stated Goals I want the pain better in my thumb and  able to use it again  in yardwork , mowing lawn and do things around the house and help my neighbors    Currently in Pain? No/denies                [facility] OT Assessment - 12/20/21 0001       AROM   Right Wrist Extension 70 Degrees    Right Wrist Flexion 60 Degrees      Strength   Right Hand Grip (lbs) 30    Right Hand Lateral Pinch 8 lbs    Right Hand 3 Point Pinch 8 lbs    Left Hand Grip (lbs) 80    Left Hand Lateral Pinch 15 lbs    Left Hand 3 Point Pinch 13 lbs                      OT Treatments/Exercises (OP) - 12/20/21 0001       RUE Paraffin   Number Minutes Paraffin 8 Minutes    RUE Paraffin Location Hand    Comments prior to  soft tissue and ROM            Pt can switch to heat alone if pain and increase flexibility prior to ROM and stretches    Soft tissue mobs done to webspace and MC spreads by OT with wrist flexion stretches    AAROM and PROM by OT for wrist flexion and extention- pt cont to be tight in flexion Pt to cont with  16oz hammer for wrist in all planes -20  reps pain free  Needed min A - hand out and review and repeat 3 x to follow hand out and demo  Cont 2 x 20 reps 2 x day after stretches     Cont thumb PA and RA - reinforce RA on table on paper to slide  Review several times and hand out provided again last week  rubber band for PA and RA  last time -10 reps - pt to do 2 set of 12 -  2 x  day  Review again - needed mod A for RA to do correctly  Opposition to  all digits and pick up 2 cm foam block but reinforce open thumb into RA every time time and making OVAL  8 reps - pt do hyper extend out of MC of thumb since [facility] Upgrade putty with 1/2 firm green add to  teal med - for gripping 20 reps but gripping mostly with digits not thumb ADD 2 x day - pain free  And then add this date lat and 3 point pinch - review several times and hand out - use CMC neoprene for prehension grips  And increase gradually over the next 10  days  Pain free    Pt to slow down and quality movement more than quantity  AGAIN REINFORCE    Keep pain under 1-2/10  And keep splint on around grand daughter but thumb CMC neoprene to use inbetween during day to support [facility] and maybe prevent hyper extention of MC during PA and RA             OT Education - 12/20/21 1311     Education Details progress and changes to HEP    Person(s) Educated Patient    Methods Explanation;Demonstration;Verbal cues;Tactile cues;Handout    Comprehension Verbal cues required;Returned demonstration;Verbalized understanding              OT Short Term Goals - 11/22/21  1600       OT SHORT TERM GOAL #1   Title Pt to be  independent in HEP and  splint wearing  to decrease pain to tolerate HEP for thumb and wrist AROM in all planes    Baseline pain coming in 4/10 at rest and splint off - with pain medication- could not tolerate all the HEP that OT wanted to provide    Time 2    Period Weeks    Status New    Target Date 12/06/21               OT Long Term Goals - 11/22/21 1602       OT LONG TERM GOAL #1   Title R thumb AROM increase for PA /RA and oppositon to 5th without increase pain to wean out of splint and initiate strengthening    Baseline AROM for thumb in all planes decrease - splint thumb spica - 3 wks s/p and pain rest 4/10    Time 3    Status New    Target Date 12/13/21      OT LONG TERM GOAL #2   Title R thumb strength increase to 4+/5 to pick up cup , turn doorknob    Baseline no strength- thumb spica - sterri strips off today - 3 wks s/p - pain rest 4/10 and AROM limited in all planes    Time 5    Period Weeks    Status New    Target Date 12/27/21      OT LONG TERM GOAL #3   Title R wrist AROM and strength increase to WNL to push up from chair , do bathing and dressing with not issues    Baseline wrist AROM decrease in all planes and some pain with RD and flexion, ext -4 /10 pain    Time 6    Period Weeks    Status New    Target Date 01/03/22      OT LONG TERM GOAL #4   Title R grip and prehension strength increase for pt to hold plate , pull and push door and carry more than 8 lbs    Baseline NT - 3 wks s/p - pain at rest 4/10 - decrease AROM in wrist and thumb    Time 9    Period Weeks    Status New    Target Date 01/24/22                   Plan - 12/20/21 1311     Clinical Impression Statement Pt  s/p R thumb CMC arthroplasty on 11/01/21 and now 7 1/2  wks s/p - making good progress in pain , AROM  and strength- but cont to have thumb CMC into ADD since [facility] - pt report his thumb was like that prior to surgery - Tolerating strength for thumb PA and RA , wrist  using 16oz hammer well - Pt prehension strength increased since last week but grip same- pt sister past away - funeral next week - will cont with HEP until follow up in 10 days. Wrist flexion still decrease but able to show increase flexion after paraffin with stretch.. ? follow thru with carry over of HEP correctly for thumb  PA and RA and wrist flexion . Pt cont to  hyper extending thumb MC during RA. Review HEP  and hand  out provided and reviewed 3 x every session. Pt to focus on quality more than quantity  during HEP - pt very fast and impulsive - pt to slow down and use CMC neoprene to keep [place] out of palm during putty - did upgrade putty for gripping - had hard time with prehension HEP review - pt cont to stay painfree with HEP/. Remind pt again his ahead with her strength and ROM. Marland Kitchen Cont to upgrade HEP to tolerance - pt to wear thumb spica when out and about and with great grand daughter otherwise doing Soft CMC neoprene    OT Occupational Profile and History Problem Focused Assessment - Including review of records relating to presenting problem    Occupational performance deficits (Please refer to evaluation for details): ADL's;IADL's;Leisure;Social Participation    Body Structure / Function / Physical Skills ADL;Strength;Dexterity;Pain;Edema;IADL;ROM;Flexibility;UE functional use    Rehab Potential Good    Clinical Decision Making Limited treatment options, no task modification necessary    Comorbidities Affecting Occupational Performance: None    Modification or Assistance to Complete Evaluation  No modification of tasks or assist necessary to complete eval    OT Frequency --   1-2 x wk   OT Duration 6 weeks    OT Treatment/Interventions Self-care/ADL training;Splinting;Therapeutic activities;Contrast Bath;Therapeutic exercise;Paraffin;Manual Therapy;Patient/family education;Passive range of motion;Scar mobilization;Fluidtherapy    Consulted and Agree with Plan of Care Patient              Patient will benefit from skilled therapeutic intervention in order to improve the following deficits and impairments:   Body Structure / Function / Physical Skills: ADL, Strength, Dexterity, Pain, Edema, IADL, ROM, Flexibility, UE functional use       Visit Diagnosis: Pain in right hand  Stiffness of right wrist, not elsewhere classified  Muscle weakness (generalized)  Stiffness of right hand, not elsewhere classified    Problem List Patient Active Problem List   Diagnosis Date Noted   Stage 3b chronic kidney disease (CKD) ([place]) 05/31/2021   Intermittent claudication ([place]) 02/25/2021   Obesity 01/22/2020   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 [place], [address] Phone: [phone]   Fax:  [phone]  Name: [name] MRN: [id number] Date of Birth: 02-23-1951

## 2021-12-27 ENCOUNTER — Other Ambulatory Visit: Payer: Self-pay | Admitting: Family Medicine

## 2021-12-27 DIAGNOSIS — I2510 Atherosclerotic heart disease of native coronary artery without angina pectoris: Secondary | ICD-10-CM

## 2021-12-28 NOTE — Telephone Encounter (Signed)
Requested medication (s) are due for refill today: na  ? ?Requested medication (s) are on the active medication list: yes ? ?Last refill:  05/18/21 #90 1 refills ? ?Future visit scheduled: yes in 2 months ? ?Notes to clinic:  pharmacy requesting 13 tabs. Do you want to refill Rx? ? ? ?  ?Requested Prescriptions  ?Pending Prescriptions Disp Refills  ? clopidogrel (PLAVIX) 75 MG tablet [Pharmacy Med Name: CLOPIDOGREL 75 MG TABLET] 13 tablet   ?  Sig: TAKE 1 TABLET BY MOUTH DAILY  ?  ? Hematology: Antiplatelets - clopidogrel Passed - 12/27/2021  8:12 PM  ?  ?  Passed - HCT in normal range and within 180 days  ?  HCT  ?Date Value Ref Range Status  ?10/21/2021 42.1 39.0 - 52.0 % Final  ? ?Hematocrit  ?Date Value Ref Range Status  ?02/25/2021 45.5 37.5 - 51.0 % Final  ?  ?  ?  ?  Passed - HGB in normal range and within 180 days  ?  Hemoglobin  ?Date Value Ref Range Status  ?10/21/2021 14.1 13.0 - 17.0 g/dL Final  ?95/18/8416 60.6 13.0 - 17.7 g/dL Final  ?  ?  ?  ?  Passed - PLT in normal range and within 180 days  ?  Platelets  ?Date Value Ref Range Status  ?10/21/2021 243 150 - 400 K/uL Final  ?02/25/2021 274 150 - 450 x10E3/uL Final  ?  ?  ?  ?  Passed - Cr in normal range and within 360 days  ?  Creatinine, Ser  ?Date Value Ref Range Status  ?12/08/2021 1.11 0.76 - 1.27 mg/dL Final  ?  ?  ?  ?  Passed - Valid encounter within last 6 months  ?  Recent Outpatient Visits   ? ?      ? 2 weeks ago Type 2 diabetes mellitus with other specified complication, without long-term current use of insulin (HCC)  ? [facility] [place], Marzella Schlein, MD  ? 3 months ago Type 2 diabetes mellitus with other specified complication, without long-term current use of insulin (HCC)  ? [facility] [place], Marzella Schlein, MD  ? 6 months ago Acute bilateral low back pain without sciatica  ? [facility] Jacky Kindle, FNP  ? 7 months ago Type 2 diabetes mellitus with other specified complication, without  long-term current use of insulin (HCC)  ? [facility] [place], Marzella Schlein, MD  ? 10 months ago Encounter for annual physical exam  ? [facility], Marzella Schlein, MD  ? ?  ?  ?Future Appointments   ? ?        ? In 2 months Bacigalupo, Marzella Schlein, MD [facility]  ? ?  ? ?  ?  ?  ? ?

## 2021-12-29 ENCOUNTER — Ambulatory Visit: Payer: No Typology Code available for payment source | Admitting: Occupational Therapy

## 2021-12-29 ENCOUNTER — Other Ambulatory Visit: Payer: Self-pay | Admitting: Family Medicine

## 2021-12-29 DIAGNOSIS — K219 Gastro-esophageal reflux disease without esophagitis: Secondary | ICD-10-CM

## 2021-12-29 DIAGNOSIS — I2510 Atherosclerotic heart disease of native coronary artery without angina pectoris: Secondary | ICD-10-CM

## 2021-12-29 DIAGNOSIS — E1165 Type 2 diabetes mellitus with hyperglycemia: Secondary | ICD-10-CM

## 2021-12-29 DIAGNOSIS — I152 Hypertension secondary to endocrine disorders: Secondary | ICD-10-CM

## 2021-12-30 NOTE — Telephone Encounter (Signed)
Requested medication (s) are due for refill today:   Yes for all 4  Requested medication (s) are on the active medication list:   Yes for all 4  Future visit scheduled:   Yes in 2 mo.   Last ordered: All 4 - 11/01/2021 90 days with no refills.   Returned because B12 level and GFR out of range.  Protocol criteria not met.  Requested Prescriptions  Pending Prescriptions Disp Refills   pantoprazole (PROTONIX) 40 MG tablet [Pharmacy Med Name: PANTOPRAZOLE SOD DR 40 MG TAB] 90 tablet 0    Sig: TAKE 1 TABLET BY MOUTH EVERY DAY AS NEEDED FOR HEARTBURN OR ACID REFLUX     Gastroenterology: Proton Pump Inhibitors Passed - 12/29/2021  8:42 PM      Passed - Valid encounter within last 12 months    Recent Outpatient Visits           3 weeks ago Type 2 diabetes mellitus with other specified complication, without long-term current use of insulin ([place])   [facility] [place], Dionne Bucy, MD   3 months ago Type 2 diabetes mellitus with other specified complication, without long-term current use of insulin ([place])   [facility], Dionne Bucy, MD   6 months ago Acute bilateral low back pain without sciatica   [facility] Tally Joe T, FNP   7 months ago Type 2 diabetes mellitus with other specified complication, without long-term current use of insulin ([place])   [facility], Dionne Bucy, MD   10 months ago Encounter for annual physical exam   [facility], Dionne Bucy, MD       Future Appointments             In 2 months Bacigalupo, Dionne Bucy, MD [facility]             metFORMIN (GLUCOPHAGE) 1000 MG tablet [Pharmacy Med Name: METFORMIN HCL 1,000 MG TABLET] 180 tablet 0    Sig: TAKE 1 TABLET BY MOUTH TWICE A DAY WITH FOOD     Endocrinology:  Diabetes - Biguanides Failed - 12/29/2021  8:42 PM      Failed - B12 Level in normal range and within 720 days    Vitamin B-12  Date  Value Ref Range Status  08/09/2020 139 (L) 232 - 1,245 pg/mL Final          Passed - Cr in normal range and within 360 days    Creatinine, Ser  Date Value Ref Range Status  12/08/2021 1.11 0.76 - 1.27 mg/dL Final          Passed - HBA1C is between 0 and 7.9 and within 180 days    Hemoglobin A1C  Date Value Ref Range Status  07/18/2021 8.1  Final   Hgb A1c MFr Bld  Date Value Ref Range Status  12/08/2021 7.6 (H) 4.8 - 5.6 % Final    Comment:             Prediabetes: 5.7 - 6.4          Diabetes: >6.4          Glycemic control for adults with diabetes: <7.0           Passed - eGFR in normal range and within 360 days    GFR calc Af Amer  Date Value Ref Range Status  08/09/2020 82 >59 mL/min/1.73 Final    Comment:    **In accordance with recommendations  from the NKF-ASN Task force,**   [facility] is in the process of updating its eGFR calculation to the   2021 CKD-EPI creatinine equation that estimates kidney function   without a race variable.    GFR, Estimated  Date Value Ref Range Status  10/21/2021 56 (L) >60 mL/min Final    Comment:    (NOTE) Calculated using the CKD-EPI Creatinine Equation (2021)    eGFR  Date Value Ref Range Status  12/08/2021 71 >59 mL/min/1.73 Final          Passed - Valid encounter within last 6 months    Recent Outpatient Visits           3 weeks ago Type 2 diabetes mellitus with other specified complication, without long-term current use of insulin ([place])   [facility] [place], Dionne Bucy, MD   3 months ago Type 2 diabetes mellitus with other specified complication, without long-term current use of insulin ([place])   [facility], Dionne Bucy, MD   6 months ago Acute bilateral low back pain without sciatica   [facility] Tally Joe T, FNP   7 months ago Type 2 diabetes mellitus with other specified complication, without long-term current use of insulin ([place])   [facility] [place], Dionne Bucy, MD   10 months ago Encounter for annual physical exam   [facility] [place], Dionne Bucy, MD       Future Appointments             In 2 months Bacigalupo, Dionne Bucy, MD [facility]            Passed - CBC within normal limits and completed in the last 12 months    WBC  Date Value Ref Range Status  10/21/2021 7.2 4.0 - 10.5 K/uL Final   RBC  Date Value Ref Range Status  10/21/2021 4.84 4.22 - 5.81 MIL/uL Final   Hemoglobin  Date Value Ref Range Status  10/21/2021 14.1 13.0 - 17.0 g/dL Final  02/25/2021 14.8 13.0 - 17.7 g/dL Final   HCT  Date Value Ref Range Status  10/21/2021 42.1 39.0 - 52.0 % Final   Hematocrit  Date Value Ref Range Status  02/25/2021 45.5 37.5 - 51.0 % Final   MCHC  Date Value Ref Range Status  10/21/2021 33.5 30.0 - 36.0 g/dL Final   [facility]  Date Value Ref Range Status  10/21/2021 29.1 26.0 - 34.0 pg Final   MCV  Date Value Ref Range Status  10/21/2021 87.0 80.0 - 100.0 fL Final  02/25/2021 89 79 - 97 fL Final   No results found for: PLTCOUNTKUC, LABPLAT, POCPLA RDW  Date Value Ref Range Status  10/21/2021 15.2 11.5 - 15.5 % Final  02/25/2021 13.9 11.6 - 15.4 % Final          metoprolol tartrate (LOPRESSOR) 50 MG tablet [Pharmacy Med Name: METOPROLOL TARTRATE 50 MG TAB] 180 tablet 0    Sig: TAKE 1 TABLET BY MOUTH TWICE A DAY     Cardiovascular:  Beta Blockers Passed - 12/29/2021  8:42 PM      Passed - Last BP in normal range    BP Readings from Last 1 Encounters:  12/08/21 116/86          Passed - Last Heart Rate in normal range    Pulse Readings from Last 1 Encounters:  12/08/21 69          Passed - Valid  encounter within last 6 months    Recent Outpatient Visits           3 weeks ago Type 2 diabetes mellitus with other specified complication, without long-term current use of insulin ([place])   [facility] [place], Dionne Bucy, MD   3 months  ago Type 2 diabetes mellitus with other specified complication, without long-term current use of insulin [facility])   [facility] [place], Dionne Bucy, MD   6 months ago Acute bilateral low back pain without sciatica   [facility] Tally Joe T, FNP   7 months ago Type 2 diabetes mellitus with other specified complication, without long-term current use of insulin ([place])   [facility], Dionne Bucy, MD   10 months ago Encounter for annual physical exam   [facility], Dionne Bucy, MD       Future Appointments             In 2 months Bacigalupo, Dionne Bucy, MD [facility]             hydrochlorothiazide (HYDRODIURIL) 25 MG tablet [Pharmacy Med Name: HYDROCHLOROTHIAZIDE 25 MG TAB] 90 tablet 0    Sig: TAKE 1 TABLET BY MOUTH DAILY     Cardiovascular: Diuretics - Thiazide Passed - 12/29/2021  8:42 PM      Passed - Cr in normal range and within 180 days    Creatinine, Ser  Date Value Ref Range Status  12/08/2021 1.11 0.76 - 1.27 mg/dL Final          Passed - K in normal range and within 180 days    Potassium  Date Value Ref Range Status  12/08/2021 4.4 3.5 - 5.2 mmol/L Final          Passed - Na in normal range and within 180 days    Sodium  Date Value Ref Range Status  12/08/2021 137 134 - 144 mmol/L Final          Passed - Last BP in normal range    BP Readings from Last 1 Encounters:  12/08/21 116/86          Passed - Valid encounter within last 6 months    Recent Outpatient Visits           3 weeks ago Type 2 diabetes mellitus with other specified complication, without long-term current use of insulin ([place])   [facility] [place], Dionne Bucy, MD   3 months ago Type 2 diabetes mellitus with other specified complication, without long-term current use of insulin [facility])   [facility] [place], Dionne Bucy, MD   6 months ago Acute bilateral low back  pain without sciatica   [facility] Tally Joe T, FNP   7 months ago Type 2 diabetes mellitus with other specified complication, without long-term current use of insulin [facility])   [facility] [place], Dionne Bucy, MD   10 months ago Encounter for annual physical exam   [facility] [place], Dionne Bucy, MD       Future Appointments             In 2 months Bacigalupo, Dionne Bucy, MD [facility]

## 2022-01-03 ENCOUNTER — Other Ambulatory Visit: Payer: Self-pay

## 2022-01-03 ENCOUNTER — Ambulatory Visit: Payer: No Typology Code available for payment source | Attending: Orthopedic Surgery | Admitting: Occupational Therapy

## 2022-01-03 DIAGNOSIS — M79641 Pain in right hand: Secondary | ICD-10-CM

## 2022-01-03 DIAGNOSIS — M25631 Stiffness of right wrist, not elsewhere classified: Secondary | ICD-10-CM

## 2022-01-03 DIAGNOSIS — M25641 Stiffness of right hand, not elsewhere classified: Secondary | ICD-10-CM

## 2022-01-03 DIAGNOSIS — M6281 Muscle weakness (generalized): Secondary | ICD-10-CM | POA: Diagnosis not present

## 2022-01-03 NOTE — Therapy (Signed)
[place] ?[place] [facility] ?[address] ?[place], [place], [address] ?Phone: [phone]   Fax:  [phone] ? ?Occupational Therapy Treatment ? ?Patient Details  ?Name: [name] ?MRN: [id number] ?Date of Birth: 1950/11/18 ?Referring Provider (OT): Dr Rudene Christians ? ? ?Encounter Date: 01/03/2022 ? ? OT End of Session - 01/03/22 1613   ? ? Visit Number 8   ? Number of Visits 16   ? Date for OT Re-Evaluation 01/24/22   ? OT Start Time 1530   ? OT Stop Time 1600   ? OT Time Calculation (min) 30 min   ? Activity Tolerance Patient tolerated treatment well   ? Behavior During Therapy [facility] for tasks assessed/performed   ? ?  ?  ? ?  ? ? ?Past Medical History:  ?Diagnosis Date  ? Arthritis   ? back  ? Cataracts, bilateral   ? a.) s/p extractions in 2018  ? Coronary artery disease 04/08/2008  ? a.)  LHC 04/08/2008: EF 48%; 75% stenosis mRCA --> PCI performed placing a 3.5 x 18 mm Xience V DES x1.  ? Depression   ? GERD (gastroesophageal reflux disease)   ? Glaucoma   ? Hyperlipidemia   ? Hypertension   ? Stage 3b chronic kidney disease ([place])   ? T2DM (type 2 diabetes mellitus) ([place])   ? ? ?Past Surgical History:  ?Procedure Laterality Date  ? CAROTID STENT    ? CARPOMETACARPAL (CMC) FUSION OF THUMB Right 11/01/2021  ? Procedure: CARPOMETACARPAL [facility]) FUSION OF THUMB;  Surgeon: Hessie Knows, MD;  Location: [facility] OR[facility];  Service: Orthopedics;  Laterality: Right;  ? CATARACT EXTRACTION W/PHACO Right 03/01/2017  ? Procedure: CATARACT EXTRACTION PHACO AND INTRAOCULAR LENS PLACEMENT (IOC);  Surgeon: Eulogio Bear, MD;  Location: [facility];  Service: Ophthalmology;  Laterality: Right;  [place] 22.6 ?AP% 6.1 ?CDE 1.37 ?Fluid pack lot # [id number]  ? CATARACT EXTRACTION W/PHACO Left 05/01/2017  ? Procedure: CATARACT EXTRACTION PHACO AND INTRAOCULAR LENS PLACEMENT (IOC)  Left Diabetic;  Surgeon: Eulogio Bear, MD;  Location: [place];  Service: Ophthalmology;  Laterality: Left;   Block ?Diabetic  ? CORONARY ANGIOPLASTY WITH STENT PLACEMENT Left 04/08/2008  ? Procedure: CORONARY ANGIOPLASTY WITH STENT PLACEMENT (3.5 x 18 mm Xience V DES x1 to mRCA); Location: [place]; Surgeon: Jonetta Speak, MD  ? POSTERIOR CERVICAL LAMINECTOMY N/A 02/25/2018  ? Procedure: POSTERIOR CERVICAL LAMINECTOMY-CERVICAL 3;  Surgeon: Kathlene November, MD;  Location: [facility];  Service: Neurosurgery;  Laterality: N/A;  ? ? ?There were no vitals filed for this visit. ? ? Subjective Assessment - 01/03/22 1612   ? ? Subjective  I know Dr Rudene Christians told me not to cut the lawn but I started but doing the zero turn - and then this thumb hurts with the cold weather   ? Pertinent History Pt s/p  right thumb CMC arthroplasty by Dr. Hessie Knows on 11/01/2021. Seen by PA to remove stitches on 11/15/21 - at that time Patient still having moderate pain. Swelling has significantly improved. He is taking Norco for his chronic pain but is having to take more than normal for breakthrough pain. Denies any numbness or tingling. No warmth erythema or drainage.Stitches were remove and stay in thumb spica until about 3 wks and then during day can take off and sleep still with it -refer to OT   ? Patient Stated Goals I want the pain better in my thumb and able to use it again  in yardwork , mowing lawn and do things around the house and help my neighbors   ? Currently in Pain? No/denies   ? ?  ?  ? ?  ? ? ? ? ? [facility] OT Assessment - 01/03/22 0001   ? ?  ? AROM  ? Right Wrist Extension 70 Degrees   ? Right Wrist Flexion 60 Degrees   ?  ? Strength  ? Right Hand Grip (lbs) 46   ? Right Hand Lateral Pinch 9 lbs   ? Right Hand 3 Point Pinch 9 lbs   ? Left Hand Grip (lbs) 80   ? Left Hand Lateral Pinch 15 lbs   ? Left Hand 3 Point Pinch 15 lbs   ? ?  ?  ? ?  ? ? ? ? ? heat increase flexibility prior to ROM and stretches  ?  ?  ?rubber band for PA and RA  last time -10 reps - pt to do 2 set of 12 -  2 x  day  ?Review again - needed mod A for RA and PA  to do correctly  ?Med teal/green putty  gripping 20 reps   hug with thumb around fist ?2 x day - pain free  ?lat and 3 point pinch - review several times and hand out - ?15 reps pain free - 2  x day increase to 2nd set over the weekend ?Pain free  ?  ?Pt to slow down and quality movement more than quantity  AGAIN REINFORCE technique for HEP  ? Pt using hand around the house and outside - grip increase greatly but thumb RA and PA  ?And prehension strength still decrease ?  ?  ?  ?  ? ? ? ? ? ? ? ? ? ? ? ? ? OT Education - 01/03/22 1613   ? ? Education Details progress and changes to HEP   ? Person(s) Educated Patient   ? Methods Explanation;Demonstration;Verbal cues;Tactile cues;Handout   ? Comprehension Verbal cues required;Returned demonstration;Verbalized understanding   ? ?  ?  ? ?  ? ? ? OT Short Term Goals - 11/22/21 1600   ? ?  ? OT SHORT TERM GOAL #1  ? Title Pt to be independent in HEP and  splint wearing  to decrease pain to tolerate HEP for thumb and wrist AROM in all planes   ? Baseline pain coming in 4/10 at rest and splint off - with pain medication- could not tolerate all the HEP that OT wanted to provide   ? Time 2   ? Period Weeks   ? Status New   ? Target Date 12/06/21   ? ?  ?  ? ?  ? ? ? ? OT Long Term Goals - 11/22/21 1602   ? ?  ? OT LONG TERM GOAL #1  ? Title R thumb AROM increase for PA /RA and oppositon to 5th without increase pain to wean out of splint and initiate strengthening   ? Baseline AROM for thumb in all planes decrease - splint thumb spica - 3 wks s/p and pain rest 4/10   ? Time 3   ? Status New   ? Target Date 12/13/21   ?  ? OT LONG TERM GOAL #2  ? Title R thumb strength increase to 4+/5 to pick up cup , turn doorknob   ? Baseline no strength- thumb spica - sterri strips off today - 3 wks s/p - pain rest 4/10 and  AROM limited in all planes   ? Time 5   ? Period Weeks   ? Status New   ? Target Date 12/27/21   ?  ? OT LONG TERM GOAL #3  ? Title R wrist AROM and strength increase to  WNL to push up from chair , do bathing and dressing with not issues   ? Baseline wrist AROM decrease in all planes and some pain with RD and flexion, ext -4 /10 pain   ? Time 6   ? Period Weeks   ? Status New   ? Target Date 01/03/22   ?  ? OT LONG TERM GOAL #4  ? Title R grip and prehension strength increase for pt to hold plate , pull and push door and carry more than 8 lbs   ? Baseline NT - 3 wks s/p - pain at rest 4/10 - decrease AROM in wrist and thumb   ? Time 9   ? Period Weeks   ? Status New   ? Target Date 01/24/22   ? ?  ?  ? ?  ? ? ? ? ? ? ? ? Plan - 01/03/22 1614   ? ? Clinical Impression Statement Patient is postop right thumb CMC arthroplasty on 11/01/2020 and now about 9 weeks postop-patient made great progress from start of care in pain active range of motion and strength at wrist.  Patient from start of care with thumb more in abduction coming out of cast.  Patient report is using his hand more and activities around the house and mow the lawn using 0 turn lawnmower.  Patient grip strength improved greatly in the last 2 weeks but prehension strength still decreased.  Denies pain.  Reviewed with patient's home exercises again for resistance for thumb and palmar radial abduction using rubber band-continues to need mod assist for correct technique.  Patient to focus the next 2 weeks on prehension strength with medium firm putty as well as grip strength.  Patient needed mod assist for putty home exercises for correct technique.   ? OT Occupational Profile and History Problem Focused Assessment - Including review of records relating to presenting problem   ? Occupational performance deficits (Please refer to evaluation for details): ADL's;IADL's;Leisure;Social Participation   ? Body Structure / Function / Physical Skills ADL;Strength;Dexterity;Pain;Edema;IADL;ROM;Flexibility;UE functional use   ? Rehab Potential Good   ? Clinical Decision Making Limited treatment options, no task modification necessary   ?  Comorbidities Affecting Occupational Performance: None   ? Modification or Assistance to Complete Evaluation  No modification of tasks or assist necessary to complete eval   ? OT Frequency --   1-2 x

## 2022-01-09 ENCOUNTER — Other Ambulatory Visit: Payer: Self-pay | Admitting: Family Medicine

## 2022-01-09 NOTE — Telephone Encounter (Signed)
Medication Refill - Medication: HYDROcodone-acetaminophen (NORCO) 5-325 MG tablet ? ?Has the patient contacted their pharmacy? Yes.   ?( ?Preferred Pharmacy (with phone number or street name): [id number] - [place], [place] - [address] AT [facility] OF [address] ?Has the patient been seen for an appointment in the last year OR does the patient have an upcoming appointment? Yes.   ? ?Agent: Please be advised that RX refills may take up to 3 business days. We ask that you follow-up with your pharmacy. ? ?

## 2022-01-10 ENCOUNTER — Ambulatory Visit: Payer: No Typology Code available for payment source | Admitting: Occupational Therapy

## 2022-01-10 ENCOUNTER — Other Ambulatory Visit: Payer: Self-pay

## 2022-01-10 DIAGNOSIS — M79641 Pain in right hand: Secondary | ICD-10-CM

## 2022-01-10 DIAGNOSIS — M25641 Stiffness of right hand, not elsewhere classified: Secondary | ICD-10-CM

## 2022-01-10 DIAGNOSIS — M6281 Muscle weakness (generalized): Secondary | ICD-10-CM

## 2022-01-10 DIAGNOSIS — M25631 Stiffness of right wrist, not elsewhere classified: Secondary | ICD-10-CM

## 2022-01-10 NOTE — Telephone Encounter (Signed)
Requested medication (s) are due for refill today: Yes ? ?Requested medication (s) are on the active medication list: Yes ? ?Last refill:  12/12/21 ? ?Future visit scheduled: Yes ? ?Notes to clinic:  Unable to refill per protocol, cannot delegate. ? ? ? ? ? ?Requested Prescriptions  ?Pending Prescriptions Disp Refills  ? HYDROcodone-acetaminophen (NORCO) 5-325 MG tablet 60 tablet 0  ?  Sig: Take 1-2 tablets by mouth every 6 (six) hours as needed for moderate pain or severe pain.  ?  ? Not Delegated - Analgesics:  Opioid Agonist Combinations Failed - 01/10/2022  2:22 PM  ?  ?  Failed - This refill cannot be delegated  ?  ?  Failed - Urine Drug Screen completed in last 360 days  ?  ?  Passed - Valid encounter within last 3 months  ?  Recent Outpatient Visits   ? ?      ? 1 month ago Type 2 diabetes mellitus with other specified complication, without long-term current use of insulin (HCC)  ? [facility] [place], Marzella Schlein, MD  ? 4 months ago Type 2 diabetes mellitus with other specified complication, without long-term current use of insulin (HCC)  ? [facility] [place], Marzella Schlein, MD  ? 6 months ago Acute bilateral low back pain without sciatica  ? [facility] Jacky Kindle, FNP  ? 7 months ago Type 2 diabetes mellitus with other specified complication, without long-term current use of insulin (HCC)  ? [facility] [place], Marzella Schlein, MD  ? 10 months ago Encounter for annual physical exam  ? [facility], Marzella Schlein, MD  ? ?  ?  ?Future Appointments   ? ?        ? In 1 month [place], Marzella Schlein, MD [facility]  ? ?  ? ?  ?  ?  ? ? ? ? ?

## 2022-01-10 NOTE — Telephone Encounter (Signed)
Pt's daughter called to report that the patient is almost out, please advise  ?

## 2022-01-10 NOTE — Therapy (Signed)
[place] ?[place] [facility] ?[address] ?[place], [place], [address] ?Phone: [phone]   Fax:  [phone] ? ?Occupational Therapy Treatment ? ?Patient Details  ?Name: [name] ?MRN: [id number] ?Date of Birth: November 13, 1950 ?Referring Provider (OT): Dr Rudene Christians ? ? ?Encounter Date: 01/10/2022 ? ? OT End of Session - 01/10/22 1433   ? ? Visit Number 9   ? Number of Visits 16   ? Date for OT Re-Evaluation 01/24/22   ? OT Start Time 1402   ? OT Stop Time 1426   ? OT Time Calculation (min) 24 min   ? Activity Tolerance Patient tolerated treatment well   ? Behavior During Therapy [facility] for tasks assessed/performed   ? ?  ?  ? ?  ? ? ?Past Medical History:  ?Diagnosis Date  ? Arthritis   ? back  ? Cataracts, bilateral   ? a.) s/p extractions in 2018  ? Coronary artery disease 04/08/2008  ? a.)  LHC 04/08/2008: EF 48%; 75% stenosis mRCA --> PCI performed placing a 3.5 x 18 mm Xience V DES x1.  ? Depression   ? GERD (gastroesophageal reflux disease)   ? Glaucoma   ? Hyperlipidemia   ? Hypertension   ? Stage 3b chronic kidney disease ([place])   ? T2DM (type 2 diabetes mellitus) ([place])   ? ? ?Past Surgical History:  ?Procedure Laterality Date  ? CAROTID STENT    ? CARPOMETACARPAL (CMC) FUSION OF THUMB Right 11/01/2021  ? Procedure: CARPOMETACARPAL [facility]) FUSION OF THUMB;  Surgeon: Hessie Knows, MD;  Location: [facility];  Service: Orthopedics;  Laterality: Right;  ? CATARACT EXTRACTION W/PHACO Right 03/01/2017  ? Procedure: CATARACT EXTRACTION PHACO AND INTRAOCULAR LENS PLACEMENT (IOC);  Surgeon: Eulogio Bear, MD;  Location: [facility];  Service: Ophthalmology;  Laterality: Right;  [place] 22.6 ?AP% 6.1 ?CDE 1.37 ?Fluid pack lot # [id number]  ? CATARACT EXTRACTION W/PHACO Left 05/01/2017  ? Procedure: CATARACT EXTRACTION PHACO AND INTRAOCULAR LENS PLACEMENT (IOC)  Left Diabetic;  Surgeon: Eulogio Bear, MD;  Location: [place];  Service: Ophthalmology;  Laterality: Left;   Block ?Diabetic  ? CORONARY ANGIOPLASTY WITH STENT PLACEMENT Left 04/08/2008  ? Procedure: CORONARY ANGIOPLASTY WITH STENT PLACEMENT (3.5 x 18 mm Xience V DES x1 to mRCA); Location: [place]; Surgeon: Jonetta Speak, MD  ? POSTERIOR CERVICAL LAMINECTOMY N/A 02/25/2018  ? Procedure: POSTERIOR CERVICAL LAMINECTOMY-CERVICAL 3;  Surgeon: Kathlene November, MD;  Location: [facility];  Service: Neurosurgery;  Laterality: N/A;  ? ? ?There were no vitals filed for this visit. ? ? Subjective Assessment - 01/10/22 1430   ? ? Subjective  I am seeing Dr Kyla Balzarine now this Monday they moved up my-but I am using the rake, pick up sticks,  I did mow the lawn last week - more strenght I am working that putty   ? Pertinent History Pt s/p  right thumb CMC arthroplasty by Dr. Hessie Knows on 11/01/2021. Seen by PA to remove stitches on 11/15/21 - at that time Patient still having moderate pain. Swelling has significantly improved. He is taking Norco for his chronic pain but is having to take more than normal for breakthrough pain. Denies any numbness or tingling. No warmth erythema or drainage.Stitches were remove and stay in thumb spica until about 3 wks and then during day can take off and sleep still with it -refer to OT   ? Patient Stated Goals I want the pain better in my thumb  and able to use it again  in yardwork , mowing lawn and do things around the house and help my neighbors   ? Currently in Pain? No/denies   ? ?  ?  ? ?  ? ? ? ? ? [facility] OT Assessment - 01/10/22 0001   ? ?  ? AROM  ? Right Wrist Extension 70 Degrees   ? Right Wrist Flexion 60 Degrees   ?  ? Strength  ? Right Hand Grip (lbs) 48   ? Right Hand Lateral Pinch 10 lbs   ? Right Hand 3 Point Pinch 12 lbs   ? Left Hand Grip (lbs) 80   ? Left Hand Lateral Pinch 15 lbs   ? Left Hand 3 Point Pinch 15 lbs   ? ?  ?  ? ?  ? ? ? ? ? ?  ? heat increase flexibility prior to ROM and stretches or to decrease soreness at home ?  ? Pt to cont at home for 2 wks with  ?rubber band for PA  and RA  last time -10 reps - pt to do 2 set of 12 -  2 x  day  ? ?Med teal/green putty  gripping 20 reps   hug with thumb around fist ?2 x day - pain free  ?lat and 3 point pinch - review several times and hand out - ?15 reps pain free - 2  x day increase to 3rd set over the weekend ?Pain free  ?Pt doing own amount of reps and sets  ?  ?Pt to slow down and quality movement more than quantity  AGAIN REINFORCE technique for HEP  ? Pt using hand around the house and outside  ? ?Pt able to push , pull heavy door , push up from chair -but not using thumb automatic- favor still  ?Carry 10 lbs but wide grip using thumb 6 lbs  ?Pt to cont with strengthening and follow up 2 wks  ? ? ? ? ? ? ? ? ? ? ? ? OT Education - 01/10/22 1433   ? ? Education Details progress and changes to HEP   ? Person(s) Educated Patient   ? Methods Explanation;Demonstration;Verbal cues;Tactile cues;Handout   ? Comprehension Verbal cues required;Returned demonstration;Verbalized understanding   ? ?  ?  ? ?  ? ? ? OT Short Term Goals - 01/10/22 1436   ? ?  ? OT SHORT TERM GOAL #1  ? Title Pt to be independent in HEP and  splint wearing  to decrease pain to tolerate HEP for thumb and wrist AROM in all planes   ? Status Achieved   ? ?  ?  ? ?  ? ? ? ? OT Long Term Goals - 01/10/22 1437   ? ?  ? OT LONG TERM GOAL #1  ? Title R thumb AROM increase for PA /RA and oppositon to 5th without increase pain to wean out of splint and initiate strengthening   ? Status Achieved   ?  ? OT LONG TERM GOAL #2  ? Title R thumb strength increase to 4+/5 to pick up cup , turn doorknob   ? Status Achieved   ?  ? OT LONG TERM GOAL #3  ? Title R wrist AROM and strength increase to WNL to push up from chair , do bathing and dressing with not issues   ? Status Achieved   ?  ? OT LONG TERM GOAL #4  ? Title R grip  and prehension strength increase for pt to hold plate , pull and push door and carry more than 8 lbs   ? Baseline NT - 3 wks s/p - pain at rest 4/10 - decrease AROM in  wrist and thumb - cannot pick up or carry wide grip more than 6 lbs   ? Time 2   ? Period Weeks   ? Status On-going   ? Target Date 01/24/22   ? ?  ?  ? ?  ? ? ? ? ? ? ? ? Plan - 01/10/22 1434   ? ? Clinical Impression Statement Patient is postop right thumb CMC arthroplasty on 11/01/2020 and now about 10 weeks postop-patient made great progress from start of care in  ROM  and strength at wrist and thumb.  Patient from start of care with thumb more in abduction coming out of cast.  Patient report is using his hand more and activities around the house and mow the lawn using 0 turn lawnmower.  Patient grip strength improved greatly  last week and prehension strenght now above 10 lbs.  Denies pain.  Reviewed with patient's home exercises again for resistance for thumb and palmar radial abduction using rubber band-continue with putty but need some  mod assist for correct technique.  Patient to focus the next 2 weeks on  grip , prehension strength with medium firm putty.  Pt to increase functional use and strength without pain - could carry 10 lbs but not using thumb for wide grip- push and pull heavy door - pt to follow up in 2 more wks - possible discharge   ? OT Occupational Profile and History Problem Focused Assessment - Including review of records relating to presenting problem   ? Occupational performance deficits (Please refer to evaluation for details): ADL's;IADL's;Leisure;Social Participation   ? Body Structure / Function / Physical Skills ADL;Strength;Dexterity;Pain;Edema;IADL;ROM;Flexibility;UE functional use   ? Rehab Potential Good   ? Clinical Decision Making Limited treatment options, no task modification necessary   ? Comorbidities Affecting Occupational Performance: None   ? Modification or Assistance to Complete Evaluation  No modification of tasks or assist necessary to complete eval   ? OT Frequency Biweekly   ? OT Duration 4 weeks   ? OT Treatment/Interventions Self-care/ADL  training;Splinting;Therapeutic activities;Contrast Bath;Therapeutic exercise;Paraffin;Manual Therapy;Patient/family education;Passive range of motion;Scar mobilization;Fluidtherapy   ? Consulted and Agree with Plan of Care P

## 2022-01-11 MED ORDER — HYDROCODONE-ACETAMINOPHEN 5-325 MG PO TABS: 1.0000 | ORAL_TABLET | Freq: Four times a day (QID) | ORAL | 0 refills | Status: DC | PRN

## 2022-01-24 ENCOUNTER — Ambulatory Visit: Payer: No Typology Code available for payment source | Attending: Orthopedic Surgery | Admitting: Occupational Therapy

## 2022-01-24 DIAGNOSIS — M25631 Stiffness of right wrist, not elsewhere classified: Secondary | ICD-10-CM | POA: Insufficient documentation

## 2022-01-24 DIAGNOSIS — M6281 Muscle weakness (generalized): Secondary | ICD-10-CM | POA: Insufficient documentation

## 2022-01-24 DIAGNOSIS — M25641 Stiffness of right hand, not elsewhere classified: Secondary | ICD-10-CM | POA: Diagnosis not present

## 2022-01-24 DIAGNOSIS — M79641 Pain in right hand: Secondary | ICD-10-CM | POA: Diagnosis not present

## 2022-01-24 NOTE — Therapy (Signed)
[place] [facility] ?[address] ?[place], [place], [address] ?Phone: [phone]   Fax:  [phone] ? ?Occupational Therapy Treatment/Discharge ? ?Patient Details  ?Name: [name] ?MRN: [id number] ?Date of Birth: 30-Nov-1950 ?Referring Provider (OT): Dr Rudene Christians ? ? ?Encounter Date: 01/24/2022 ? ? OT End of Session - 01/24/22 1437   ? ? Visit Number 10   ? Number of Visits 10   ? Date for OT Re-Evaluation 01/24/22   ? OT Start Time 1440   ? OT Stop Time 1455   ? OT Time Calculation (min) 15 min   ? Activity Tolerance Patient tolerated treatment well   ? Behavior During Therapy [facility] for tasks assessed/performed   ? ?  ?  ? ?  ? ? ?Past Medical History:  ?Diagnosis Date  ? Arthritis   ? back  ? Cataracts, bilateral   ? a.) s/p extractions in 2018  ? Coronary artery disease 04/08/2008  ? a.)  LHC 04/08/2008: EF 48%; 75% stenosis mRCA --> PCI performed placing a 3.5 x 18 mm Xience V DES x1.  ? Depression   ? GERD (gastroesophageal reflux disease)   ? Glaucoma   ? Hyperlipidemia   ? Hypertension   ? Stage 3b chronic kidney disease ([place])   ? T2DM (type 2 diabetes mellitus) ([place])   ? ? ?Past Surgical History:  ?Procedure Laterality Date  ? CAROTID STENT    ? CARPOMETACARPAL (CMC) FUSION OF THUMB Right 11/01/2021  ? Procedure: CARPOMETACARPAL [facility]) FUSION OF THUMB;  Surgeon: Hessie Knows, MD;  Location: [facility];  Service: Orthopedics;  Laterality: Right;  ? CATARACT EXTRACTION W/PHACO Right 03/01/2017  ? Procedure: CATARACT EXTRACTION PHACO AND INTRAOCULAR LENS PLACEMENT (IOC);  Surgeon: Eulogio Bear, MD;  Location: [facility];  Service: Ophthalmology;  Laterality: Right;  [place] 22.6 ?AP% 6.1 ?CDE 1.37 ?Fluid pack lot # [id number]  ? CATARACT EXTRACTION W/PHACO Left 05/01/2017  ? Procedure: CATARACT EXTRACTION PHACO AND INTRAOCULAR LENS PLACEMENT (IOC)  Left Diabetic;  Surgeon: Eulogio Bear, MD;  Location: [place];  Service: Ophthalmology;  Laterality:  Left;  Block ?Diabetic  ? CORONARY ANGIOPLASTY WITH STENT PLACEMENT Left 04/08/2008  ? Procedure: CORONARY ANGIOPLASTY WITH STENT PLACEMENT (3.5 x 18 mm Xience V DES x1 to mRCA); Location: [place]; Surgeon: Jonetta Speak, MD  ? POSTERIOR CERVICAL LAMINECTOMY N/A 02/25/2018  ? Procedure: POSTERIOR CERVICAL LAMINECTOMY-CERVICAL 3;  Surgeon: Kathlene November, MD;  Location: [facility];  Service: Neurosurgery;  Laterality: N/A;  ? ? ?There were no vitals filed for this visit. ? ? Subjective Assessment - 01/24/22 1437   ? ? Subjective  And seen Dr. Rudene Christians who released me but said I am not to do for the next 6 months any heavy stuff in the chart and gardening   ? Pertinent History Pt s/p  right thumb CMC arthroplasty by Dr. Hessie Knows on 11/01/2021. Seen by PA to remove stitches on 11/15/21 - at that time Patient still having moderate pain. Swelling has significantly improved. He is taking Norco for his chronic pain but is having to take more than normal for breakthrough pain. Denies any numbness or tingling. No warmth erythema or drainage.Stitches were remove and stay in thumb spica until about 3 wks and then during day can take off and sleep still with it -refer to OT   ? Patient Stated Goals I want the pain better in my thumb and able to use it again  in [place] ,  mowing lawn and do things around the house and help my neighbors   ? Currently in Pain? No/denies   ? ?  ?  ? ?  ? ? ? ? ? [facility] OT Assessment - 01/24/22 0001   ? ?  ? AROM  ? Right Wrist Extension 70 Degrees   ? Right Wrist Flexion 60 Degrees   ?  ? Strength  ? Right Hand Grip (lbs) 48   ? Right Hand Lateral Pinch 10 lbs   ? Right Hand 3 Point Pinch 12 lbs   ? Left Hand Grip (lbs) 80   ? Left Hand Lateral Pinch 15 lbs   ? Left Hand 3 Point Pinch 15 lbs   ? ?  ?  ? ?  ? ? ? ? ?  Patient was seen 2 weeks ago patient arrived today after seeing Dr. Rudene Christians last week.  Patient reports no increased pain since then patient's grip and prehension same than 2 weeks ago.   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  Patient can easily carry and lift 10 pounds but with a wide grip including thumb increase this date to 7 pounds but unable to do 8 pounds.  Dr. Tery Sanfilippo recommend for patient not to do heavy pounding weed eating for 6 months.  OT agree patient still favoring thumb during strengthening.  Patient can continue with putty home exercises as well as functional increase of strength gradually pain-free patient in agreement to be discharged at this time. ? ? ? ? ? ? ? ? ? ? ? ? ? ? OT Education - 01/24/22 1437   ? ? Education Details Discharge instructions   ? Person(s) Educated Patient   ? Methods Explanation;Demonstration;Verbal cues;Tactile cues;Handout   ? Comprehension Verbal cues required;Returned demonstration;Verbalized understanding   ? ?  ?  ? ?  ? ? ? OT Short Term Goals - 01/10/22 1436   ? ?  ? OT SHORT TERM GOAL #1  ? Title Pt to be independent in HEP and  splint wearing  to decrease pain to tolerate HEP for thumb and wrist AROM in all planes   ? Status Achieved   ? ?  ?  ? ?  ? ? ? ? OT Long Term Goals - 01/24/22 1506   ? ?  ? OT LONG TERM GOAL #1  ? Title R thumb AROM increase for PA /RA and oppositon to 5th without increase pain to wean out of splint and initiate strengthening   ? Status Achieved   ?  ? OT LONG TERM GOAL #2  ? Title R thumb strength increase to 4+/5 to pick up cup , turn doorknob   ? Status Achieved   ?  ? OT LONG TERM GOAL #3  ? Title R wrist AROM and strength increase to WNL to push up from chair , do bathing and dressing with not issues   ? Status Achieved   ?  ? OT LONG TERM GOAL #4  ? Title R grip and prehension strength increase for pt to hold plate , pull and push door and carry more than 8 lbs   ? Baseline NT - 3 wks s/p - pain at rest 4/10 - decrease AROM in wrist and thumb -patient can now pick up and carry 10 pounds easy and 7 pounds with a wide grip using thumb   ?  Status Achieved   ? ?  ?  ? ?  ? ? ? ? ? ? ? ?  Plan - 01/24/22 1437   ? ? Clinical Impression Statement Patient is postop right thumb CMC arthroplasty on 11/01/2020 and now 12 weeks postop-patient made great progress from start of care in  ROM  and strength at wrist and thumb.  Patient from start of care with thumb more in abduction coming out of cast.  Patient report is using his hand more and activities around the house and mow the lawn using 0 turn lawnmower.  Patient grip strength 48 pounds with no pain and prehension strenght now above 10 lbs.  Denies pain.  Patient can carry 10 lbs with no issues or pain and doing 7 using  thumb for wide grip- push and pull heavy door with no issues- pt seen DR Rudene Christians - still to for 6 months.  Patient met all OT goals and is discharged at this time   ? OT Occupational Profile and History Problem Focused Assessment - Including review of records relating to presenting problem   ? Occupational performance deficits (Please refer to evaluation for details): ADL's;IADL's;Leisure;Social Participation   ? Body Structure / Function / Physical Skills ADL;Strength;Dexterity;Pain;Edema;IADL;ROM;Flexibility;UE functional use   ? Rehab Potential Good   ? Clinical Decision Making Limited treatment options, no task modification necessary   ? Comorbidities Affecting Occupational Performance: None   ? Modification or Assistance to Complete Evaluation  No modification of tasks or assist necessary to complete eval   ? OT Treatment/Interventions Self-care/ADL training;Splinting;Therapeutic activities;Contrast Bath;Therapeutic exercise;Paraffin;Manual Therapy;Patient/family education;Passive range of motion;Scar mobilization;Fluidtherapy   ? Consulted and Agree with Plan of Care Patient   ? ?  ?  ? ?  ? ? ?Patient will benefit from skilled therapeutic intervention in order to improve the following deficits and impairments:   ?Body Structure / Function / Physical Skills: ADL, Strength, Dexterity, Pain,  Edema, IADL, ROM, Flexibility, UE functional use ?  ?  ? ? ?Visit Diagnosis: ?Pain in right hand ? ?Stiffness of right wrist, not elsewhere classified ? ?Muscle weakness (generalized) ? ?Stiffness

## 2022-01-31 ENCOUNTER — Ambulatory Visit (INDEPENDENT_AMBULATORY_CARE_PROVIDER_SITE_OTHER): Payer: No Typology Code available for payment source

## 2022-01-31 VITALS — Wt 198.0 lb

## 2022-01-31 DIAGNOSIS — Z0000 Encounter for general adult medical examination without abnormal findings: Secondary | ICD-10-CM

## 2022-01-31 NOTE — Progress Notes (Signed)
?Virtual Visit via Telephone Note ? ?I connected with  [name] on 01/31/22 at  2:15 PM EDT by telephone and verified that I am speaking with the correct person using two identifiers. Spoke w/ daughter Caren Griffins ? ?Location: ?Patient: home ?Provider: [facility] ?Persons participating in the virtual visit: patient/Nurse Health Advisor ?  ?I discussed the limitations, risks, security and privacy concerns of performing an evaluation and management service by telephone and the availability of in person appointments. The patient expressed understanding and agreed to proceed. ? ?Interactive audio and video telecommunications were attempted between this nurse and patient, however failed, due to patient having technical difficulties OR patient did not have access to video capability.  We continued and completed visit with audio only. ? ?Some vital signs may be absent or patient reported.  ? ?Dionisio David, LPN ? ?Subjective:  ? [name] is a 71 y.o. male who presents for Medicare Annual/Subsequent preventive examination. ? ?Review of Systems    ? ?  ? ?   ?Objective:  ?  ?There were no vitals filed for this visit. ?There is no height or weight on file to calculate BMI. ? ? ?  11/01/2021  ? 12:59 PM 10/20/2021  ?  3:39 PM 01/25/2021  ?  2:44 PM 01/20/2020  ?  9:51 AM 02/25/2018  ?  6:00 PM 02/25/2018  ?  6:08 AM 02/21/2018  ?  2:51 PM  ?Advanced Directives  ?Does Patient Have a Medical Advance Directive? Yes Yes Yes Yes  Yes   ?Type of Industrial/product designer of [place];Living will [place];Living will  [place];Living will   ?Does patient want to make changes to medical advance directive? No - Patient declined    No - Patient declined    ?Copy of [place] in Chart? Yes - validated most recent copy scanned in chart (See row information) Yes - validated most recent copy scanned in chart (See row  information) Yes - validated most recent copy scanned in chart (See row information) Yes - validated most recent copy scanned in chart (See row information) No - copy requested  Yes  ?Would patient like information on creating a medical advance directive? No - Patient declined        ? ? ?Current Medications (verified) ?Outpatient Encounter Medications as of 01/31/2022  ?Medication Sig  ? oxyCODONE (OXY IR/ROXICODONE) 5 MG immediate release tablet Take by mouth.  ? acetaminophen (TYLENOL) 500 MG tablet Take 1,000 mg by mouth every 6 (six) hours as needed for mild pain or moderate pain.  ? acetaminophen (TYLENOL) 500 MG tablet Take by mouth.  ? amLODipine (NORVASC) 5 MG tablet Take 1 tablet (5 mg total) by mouth daily.  ? aspirin 81 MG tablet Take 81 mg by mouth daily.   ? atorvastatin (LIPITOR) 80 MG tablet TAKE ONE (1) TABLET EACH DAY  ? blood glucose meter kit and supplies KIT Dispense based on patient and insurance preference. Check sugars once daily  ? Cholecalciferol (VITAMIN D3 PO) Take 1 tablet by mouth daily.  ? clopidogrel (PLAVIX) 75 MG tablet TAKE 1 TABLET BY MOUTH DAILY  ? Dulaglutide (TRULICITY) 4.5 ZO/1.0RU SOPN Inject 4.5 mg as directed once a week.  ? DULoxetine (CYMBALTA) 20 MG capsule Take 20 mg by mouth daily.  ? ezetimibe (ZETIA) 10 MG tablet Take 1 tablet (10 mg total) by mouth daily.  ? fenofibrate (TRICOR) 145  MG tablet TAKE 1 TABLET(145 MG) BY MOUTH DAILY  ? furosemide (LASIX) 20 MG tablet Take 20 mg by mouth daily.  ? glucose blood (ONETOUCH VERIO) test strip TEST BLOOD GLUCOSE EVERY DAY  ? hydrochlorothiazide (HYDRODIURIL) 25 MG tablet TAKE 1 TABLET BY MOUTH DAILY  ? HYDROcodone-acetaminophen (NORCO) 5-325 MG tablet Take 1-2 tablets by mouth every 6 (six) hours as needed for moderate pain or severe pain.  ? JARDIANCE 25 MG TABS tablet TAKE 1 TABLET(25 MG) BY MOUTH DAILY  ? Lidocaine 4 % PTCH Apply 1 patch topically daily as needed (pain).  ? Liniments (BEN GAY EX) Apply 1 application  topically daily as needed (pain).  ? lisinopril (ZESTRIL) 20 MG tablet Take 1 tablet (20 mg total) by mouth daily.  ? metFORMIN (GLUCOPHAGE) 1000 MG tablet TAKE 1 TABLET BY MOUTH TWICE A DAY WITH FOOD  ? metoprolol tartrate (LOPRESSOR) 50 MG tablet TAKE 1 TABLET BY MOUTH TWICE A DAY  ? pantoprazole (PROTONIX) 40 MG tablet TAKE 1 TABLET BY MOUTH EVERY DAY AS NEEDED FOR HEARTBURN OR ACID REFLUX  ? tamsulosin (FLOMAX) 0.4 MG CAPS capsule Take 0.4 mg by mouth daily.  ? ?No facility-administered encounter medications on file as of 01/31/2022.  ? ? ?Allergies (verified) ?Amoxicillin and Fexofenadine  ? ?History: ?Past Medical History:  ?Diagnosis Date  ? Arthritis   ? back  ? Cataracts, bilateral   ? a.) s/p extractions in 2018  ? Coronary artery disease 04/08/2008  ? a.)  LHC 04/08/2008: EF 48%; 75% stenosis mRCA --> PCI performed placing a 3.5 x 18 mm Xience V DES x1.  ? Depression   ? GERD (gastroesophageal reflux disease)   ? Glaucoma   ? Hyperlipidemia   ? Hypertension   ? Stage 3b chronic kidney disease ([place])   ? T2DM (type 2 diabetes mellitus) ([place])   ? ?Past Surgical History:  ?Procedure Laterality Date  ? CAROTID STENT    ? CARPOMETACARPAL (CMC) FUSION OF THUMB Right 11/01/2021  ? Procedure: CARPOMETACARPAL [facility]) FUSION OF THUMB;  Surgeon: Hessie Knows, MD;  Location: [facility];  Service: Orthopedics;  Laterality: Right;  ? CATARACT EXTRACTION W/PHACO Right 03/01/2017  ? Procedure: CATARACT EXTRACTION PHACO AND INTRAOCULAR LENS PLACEMENT (IOC);  Surgeon: Eulogio Bear, MD;  Location: [facility];  Service: Ophthalmology;  Laterality: Right;  [place] 22.6 ?AP% 6.1 ?CDE 1.37 ?Fluid pack lot # [id number]  ? CATARACT EXTRACTION W/PHACO Left 05/01/2017  ? Procedure: CATARACT EXTRACTION PHACO AND INTRAOCULAR LENS PLACEMENT (IOC)  Left Diabetic;  Surgeon: Eulogio Bear, MD;  Location: [place];  Service: Ophthalmology;  Laterality: Left;  Block ?Diabetic  ? CORONARY ANGIOPLASTY WITH STENT PLACEMENT Left  04/08/2008  ? Procedure: CORONARY ANGIOPLASTY WITH STENT PLACEMENT (3.5 x 18 mm Xience V DES x1 to mRCA); Location: [place]; Surgeon: Jonetta Speak, MD  ? POSTERIOR CERVICAL LAMINECTOMY N/A 02/25/2018  ? Procedure: POSTERIOR CERVICAL LAMINECTOMY-CERVICAL 3;  Surgeon: Kathlene November, MD;  Location: [facility];  Service: Neurosurgery;  Laterality: N/A;  ? ?Family History  ?Problem Relation Age of Onset  ? Diabetes Mother   ?     mellitus type2  ? ?Social History  ? ?Socioeconomic History  ? Marital status: Divorced  ?  Spouse name: Not on file  ? Number of children: 2  ? Years of education: Not on file  ? Highest education level: 9th grade  ?Occupational History  ? Occupation: retired  ?Tobacco Use  ? Smoking status: Former  ?  Types: Cigars  ?  Quit date: 05/06/2020  ?  Years since quitting: 1.7  ? Smokeless tobacco: Never  ? Tobacco comments:  ?  smokes cigars-about 10-15 cigars a week, quit cigarettes 20+ years ago  ?Vaping Use  ? Vaping Use: Never used  ?Substance and Sexual Activity  ? Alcohol use: No  ? Drug use: No  ? Sexual activity: Not on file  ?Other Topics Concern  ? Not on file  ?Social History Narrative  ? Lives alone, illiterate, doesn't drive  ? Daughter, Donella Stade has power of attorney  ? ?Social Determinants of Health  ? ?Financial Resource Strain: Not on file  ?Food Insecurity: Not on file  ?Transportation Needs: Not on file  ?Physical Activity: Not on file  ?Stress: Not on file  ?Social Connections: Not on file  ? ? ?Tobacco Counseling ?Counseling given: Not Answered ?Tobacco comments: smokes cigars-about 10-15 cigars a week, quit cigarettes 20+ years ago ? ? ?Clinical Intake: ? ?Pre-visit preparation completed: Yes ? ?Pain : No/denies pain ? ?  ? ?Diabetes: Yes ?CBG done?: No ?Did pt. bring in CBG monitor from home?: No ? ?How often do you need to have someone help you when you read instructions, pamphlets, or other written materials from your doctor or pharmacy?: 1 -  Never ? ?Diabetic?yes ?Nutrition Risk Assessment: ? ?Has the patient had any N/V/D within the last 2 months?  No  ?Does the patient have any non-healing wounds?  No  ?Has the patient had any unintentional weight loss or weight gain?  No  ? ?Diabetes:

## 2022-01-31 NOTE — Patient Instructions (Addendum)
Mr. Stueck , ?Thank you for taking time to come for your Medicare Wellness Visit. I appreciate your ongoing commitment to your health goals. Please review the following plan we discussed and let me know if I can assist you in the future.  ? ?Screening recommendations/referrals: ?Colonoscopy: 12/07/17 ?Recommended yearly ophthalmology/optometry visit for glaucoma screening and checkup ?Recommended yearly dental visit for hygiene and checkup ? ?Vaccinations: ?Influenza vaccine: 09/02/21 ?Pneumococcal vaccine: 09/21/17 ?Tdap vaccine: 09/27/12 ?Shingles vaccine: Zostavax 09/06/15  Shingrix 08/09/20, states had 2nd shot   ?Covid-19: 12/12/19, 01/02/20, 08/19/20 ? ?Advanced directives: no ? ?Conditions/risks identified: none ? ?Next appointment: Follow up in one year for your annual wellness visit. - 02/05/23 @ 1:30pm by phone ? ?Preventive Care 72 Years and Older, Male ?Preventive care refers to lifestyle choices and visits with your health care provider that can promote health and wellness. ?What does preventive care include? ?A yearly physical exam. This is also called an annual well check. ?Dental exams once or twice a year. ?Routine eye exams. Ask your health care provider how often you should have your eyes checked. ?Personal lifestyle choices, including: ?Daily care of your teeth and gums. ?Regular physical activity. ?Eating a healthy diet. ?Avoiding tobacco and drug use. ?Limiting alcohol use. ?Practicing safe sex. ?Taking low doses of aspirin every day. ?Taking vitamin and mineral supplements as recommended by your health care provider. ?What happens during an annual well check? ?The services and screenings done by your health care provider during your annual well check will depend on your age, overall health, lifestyle risk factors, and family history of disease. ?Counseling  ?Your health care provider may ask you questions about your: ?Alcohol use. ?Tobacco use. ?Drug use. ?Emotional well-being. ?Home and  relationship well-being. ?Sexual activity. ?Eating habits. ?History of falls. ?Memory and ability to understand (cognition). ?Work and work Statistician. ?Screening  ?You may have the following tests or measurements: ?Height, weight, and BMI. ?Blood pressure. ?Lipid and cholesterol levels. These may be checked every 5 years, or more frequently if you are over 87 years old. ?Skin check. ?Lung cancer screening. You may have this screening every year starting at age 38 if you have a 30-pack-year history of smoking and currently smoke or have quit within the past 15 years. ?Fecal occult blood test (FOBT) of the stool. You may have this test every year starting at age 32. ?Flexible sigmoidoscopy or colonoscopy. You may have a sigmoidoscopy every 5 years or a colonoscopy every 10 years starting at age 31. ?Prostate cancer screening. Recommendations will vary depending on your family history and other risks. ?Hepatitis C blood test. ?Hepatitis B blood test. ?Sexually transmitted disease (STD) testing. ?Diabetes screening. This is done by checking your blood sugar (glucose) after you have not eaten for a while (fasting). You may have this done every 1-3 years. ?Abdominal aortic aneurysm (AAA) screening. You may need this if you are a current or former smoker. ?Osteoporosis. You may be screened starting at age 84 if you are at high risk. ?Talk with your health care provider about your test results, treatment options, and if necessary, the need for more tests. ?Vaccines  ?Your health care provider may recommend certain vaccines, such as: ?Influenza vaccine. This is recommended every year. ?Tetanus, diphtheria, and acellular pertussis (Tdap, Td) vaccine. You may need a Td booster every 10 years. ?Zoster vaccine. You may need this after age 14. ?Pneumococcal 13-valent conjugate (PCV13) vaccine. One dose is recommended after age 80. ?Pneumococcal polysaccharide (PPSV23) vaccine. One dose is recommended  after age 44. ?Talk to your  health care provider about which screenings and vaccines you need and how often you need them. ?This information is not intended to replace advice given to you by your health care provider. Make sure you discuss any questions you have with your health care provider. ?Document Released: 11/05/2015 Document Revised: 06/28/2016 Document Reviewed: 08/10/2015 ?Elsevier Interactive Patient Education ? 2017 [place]. ? ?Fall Prevention in the Home ?Falls can cause injuries. They can happen to people of all ages. There are many things you can do to make your home safe and to help prevent falls. ?What can I do on the outside of my home? ?Regularly fix the edges of walkways and driveways and fix any cracks. ?Remove anything that might make you trip as you walk through a door, such as a raised step or threshold. ?Trim any bushes or trees on the path to your home. ?Use bright outdoor lighting. ?Clear any walking paths of anything that might make someone trip, such as rocks or tools. ?Regularly check to see if handrails are loose or broken. Make sure that both sides of any steps have handrails. ?Any raised decks and porches should have guardrails on the edges. ?Have any leaves, snow, or ice cleared regularly. ?Use sand or salt on walking paths during winter. ?Clean up any spills in your garage right away. This includes oil or grease spills. ?What can I do in the bathroom? ?Use night lights. ?Install grab bars by the toilet and in the tub and shower. Do not use towel bars as grab bars. ?Use non-skid mats or decals in the tub or shower. ?If you need to sit down in the shower, use a plastic, non-slip stool. ?Keep the floor dry. Clean up any water that spills on the floor as soon as it happens. ?Remove soap buildup in the tub or shower regularly. ?Attach bath mats securely with double-sided non-slip rug tape. ?Do not have throw rugs and other things on the floor that can make you trip. ?What can I do in the bedroom? ?Use night  lights. ?Make sure that you have a light by your bed that is easy to reach. ?Do not use any sheets or blankets that are too big for your bed. They should not hang down onto the floor. ?Have a firm chair that has side arms. You can use this for support while you get dressed. ?Do not have throw rugs and other things on the floor that can make you trip. ?What can I do in the kitchen? ?Clean up any spills right away. ?Avoid walking on wet floors. ?Keep items that you use a lot in easy-to-reach places. ?If you need to reach something above you, use a strong step stool that has a grab bar. ?Keep electrical cords out of the way. ?Do not use floor polish or wax that makes floors slippery. If you must use wax, use non-skid floor wax. ?Do not have throw rugs and other things on the floor that can make you trip. ?What can I do with my stairs? ?Do not leave any items on the stairs. ?Make sure that there are handrails on both sides of the stairs and use them. Fix handrails that are broken or loose. Make sure that handrails are as long as the stairways. ?Check any carpeting to make sure that it is firmly attached to the stairs. Fix any carpet that is loose or worn. ?Avoid having throw rugs at the top or bottom of the stairs. If you  do have throw rugs, attach them to the floor with carpet tape. ?Make sure that you have a light switch at the top of the stairs and the bottom of the stairs. If you do not have them, ask someone to add them for you. ?What else can I do to help prevent falls? ?Wear shoes that: ?Do not have high heels. ?Have rubber bottoms. ?Are comfortable and fit you well. ?Are closed at the toe. Do not wear sandals. ?If you use a stepladder: ?Make sure that it is fully opened. Do not climb a closed stepladder. ?Make sure that both sides of the stepladder are locked into place. ?Ask someone to hold it for you, if possible. ?Clearly mark and make sure that you can see: ?Any grab bars or handrails. ?First and last  steps. ?Where the edge of each step is. ?Use tools that help you move around (mobility aids) if they are needed. These include: ?Canes. ?Walkers. ?Scooters. ?Crutches. ?Turn on the lights when you go into a dark area.

## 2022-02-06 ENCOUNTER — Other Ambulatory Visit: Payer: Self-pay | Admitting: Family Medicine

## 2022-02-06 NOTE — Telephone Encounter (Signed)
Copied from [place] [id number]. Topic: General - Other ?>> Feb 06, 2022  4:35 PM Tessa Lerner A wrote: ?Reason for CRM: Medication Refill - Medication: HYDROcodone-acetaminophen (NORCO) 5-325 MG tablet [id number]  ? ?Has the patient contacted their pharmacy? No. ?(Agent: If no, request that the patient contact the pharmacy for the refill. If patient does not wish to contact the pharmacy document the reason why and proceed with request.) ?(Agent: If yes, when and what did the pharmacy advise?) ? ?Preferred Pharmacy (with phone number or street name): [facility] [place], [address] ?[place] [address] ?Phone: [phone] Fax: [phone] ?Hours: Not open 24 hours ? ?Has the patient been seen for an appointment in the last year OR does the patient have an upcoming appointment? Yes.   ? ?Agent: Please be advised that RX refills may take up to 3 business days. We ask that you follow-up with your pharmacy. ?

## 2022-02-07 NOTE — Telephone Encounter (Signed)
Requested medications are due for refill today.  yes ? ?Requested medications are on the active medications list.  yes ? ?Last refill. 01/11/2022 #60 0 refills ? ?Future visit scheduled.   yes ? ?Notes to clinic.  Medication refill is not delegated. ? ? ? ?Requested Prescriptions  ?Pending Prescriptions Disp Refills  ? HYDROcodone-acetaminophen (NORCO) 5-325 MG tablet 60 tablet 0  ?  Sig: Take 1-2 tablets by mouth every 6 (six) hours as needed for moderate pain or severe pain.  ?  ? Not Delegated - Analgesics:  Opioid Agonist Combinations Failed - 02/06/2022  5:15 PM  ?  ?  Failed - This refill cannot be delegated  ?  ?  Failed - Urine Drug Screen completed in last 360 days  ?  ?  Passed - Valid encounter within last 3 months  ?  Recent Outpatient Visits   ? ?      ? 2 months ago Type 2 diabetes mellitus with other specified complication, without long-term current use of insulin ([place])  ? [facility] [place], Dionne Bucy, MD  ? 5 months ago Type 2 diabetes mellitus with other specified complication, without long-term current use of insulin ([place])  ? [facility] [place], Dionne Bucy, MD  ? 7 months ago Acute bilateral low back pain without sciatica  ? [facility] Gwyneth Sprout, FNP  ? 8 months ago Type 2 diabetes mellitus with other specified complication, without long-term current use of insulin ([place])  ? [facility] [place], Dionne Bucy, MD  ? 11 months ago Encounter for annual physical exam  ? [facility], Dionne Bucy, MD  ? ?  ?  ?Future Appointments   ? ?        ? In 1 month Bacigal[place], Dionne Bucy, MD [facility]  ? ?  ? ? ?  ?  ?  ?  ?

## 2022-02-09 MED ORDER — HYDROCODONE-ACETAMINOPHEN 5-325 MG PO TABS: 1.0000 | ORAL_TABLET | Freq: Four times a day (QID) | ORAL | 0 refills | Status: DC | PRN

## 2022-02-10 ENCOUNTER — Other Ambulatory Visit: Payer: Self-pay | Admitting: Family Medicine

## 2022-02-10 DIAGNOSIS — I152 Hypertension secondary to endocrine disorders: Secondary | ICD-10-CM

## 2022-02-10 NOTE — Telephone Encounter (Signed)
Requested Prescriptions  ?Pending Prescriptions Disp Refills  ?? amLODipine (NORVASC) 5 MG tablet [Pharmacy Med Name: AMLODIPINE BESYLATE 5 MG TAB] 90 tablet 0  ?  Sig: TAKE 1 TABLET BY MOUTH DAILY  ?  ? Cardiovascular: Calcium Channel Blockers 2 Passed - 02/10/2022  1:20 PM  ?  ?  Passed - Last BP in normal range  ?  BP Readings from Last 1 Encounters:  ?12/08/21 116/86  ?   ?  ?  Passed - Last Heart Rate in normal range  ?  Pulse Readings from Last 1 Encounters:  ?12/08/21 69  ?   ?  ?  Passed - Valid encounter within last 6 months  ?  Recent Outpatient Visits   ?      ? 2 months ago Type 2 diabetes mellitus with other specified complication, without long-term current use of insulin (HCC)  ? [facility] [place], Marzella Schlein, MD  ? 5 months ago Type 2 diabetes mellitus with other specified complication, without long-term current use of insulin (HCC)  ? [facility] [place], Marzella Schlein, MD  ? 7 months ago Acute bilateral low back pain without sciatica  ? [facility] Jacky Kindle, FNP  ? 8 months ago Type 2 diabetes mellitus with other specified complication, without long-term current use of insulin (HCC)  ? [facility] [place], Marzella Schlein, MD  ? 11 months ago Encounter for annual physical exam  ? [facility], Marzella Schlein, MD  ?  ?  ?Future Appointments   ?        ? In 4 weeks Bacigal[place], Marzella Schlein, MD [facility]  ?  ? ?  ?  ?  ? ? ?

## 2022-02-17 ENCOUNTER — Telehealth: Payer: Self-pay

## 2022-02-17 NOTE — Telephone Encounter (Signed)
LM for [name] patient's daughter to go ahead and schedule his shingles shot at [facility]. ?

## 2022-02-17 NOTE — Telephone Encounter (Signed)
Copied from CRM [id number]. Topic: General - Other ?>> Feb 16, 2022  4:39 PM Marylen Ponto wrote: ?Reason for CRM: Pt daughter stated due to insurance pt has to have his shingles shot done at the drug store so she wanted to ask Dr. B if there was anything that she needed to do before setting up appt for [facility] at [facility]. ?

## 2022-02-20 DIAGNOSIS — E785 Hyperlipidemia, unspecified: Secondary | ICD-10-CM | POA: Diagnosis not present

## 2022-02-20 DIAGNOSIS — I10 Essential (primary) hypertension: Secondary | ICD-10-CM | POA: Diagnosis not present

## 2022-02-20 DIAGNOSIS — I2510 Atherosclerotic heart disease of native coronary artery without angina pectoris: Secondary | ICD-10-CM | POA: Diagnosis not present

## 2022-02-20 DIAGNOSIS — Z955 Presence of coronary angioplasty implant and graft: Secondary | ICD-10-CM | POA: Diagnosis not present

## 2022-03-07 DIAGNOSIS — E1122 Type 2 diabetes mellitus with diabetic chronic kidney disease: Secondary | ICD-10-CM | POA: Diagnosis not present

## 2022-03-07 DIAGNOSIS — I10 Essential (primary) hypertension: Secondary | ICD-10-CM | POA: Diagnosis not present

## 2022-03-07 DIAGNOSIS — N182 Chronic kidney disease, stage 2 (mild): Secondary | ICD-10-CM | POA: Diagnosis not present

## 2022-03-08 NOTE — Progress Notes (Signed)
Established patient visit   I,Joseline E Rosas,acting as a scribe for Lavon Paganini, MD.,have documented all relevant documentation on the behalf of Lavon Paganini, MD,as directed by  Lavon Paganini, MD while in the presence of Lavon Paganini, MD.   Patient: [name]   DOB: Mar 05, 1951   70 y.o. Male  MRN: [id number] Visit Date: 03/10/2022  Today's healthcare provider: Lavon Paganini, MD   Chief Complaint  Patient presents with   Follow-up   Subjective    HPI  Diabetes Mellitus Type II, Follow-up  Lab Results  Component Value Date   HGBA1C 7.2 (A) 03/10/2022   HGBA1C 7.6 (H) 12/08/2021   HGBA1C 8.1 07/18/2021   Wt Readings from Last 3 Encounters:  03/10/22 195 lb 12.8 oz (88.8 kg)  01/31/22 198 lb (89.8 kg)  12/08/21 198 lb 9.6 oz (90.1 kg)   Last seen for diabetes 3 months ago.  Management since then includes continue Trulicity. Stop [place].  He reports excellent compliance with treatment. He is not having side effects.  Symptoms: No fatigue No foot ulcerations  No appetite changes No nausea  No paresthesia of the feet  No polydipsia  No polyuria No visual disturbances   No vomiting     Home blood sugar records:  not being checked  Episodes of hypoglycemia? No    Current insulin regiment: none Most Recent Eye Exam: UTD Current exercise: walking Current diet habits: well balanced  Pertinent Labs: Lab Results  Component Value Date   CHOL 145 12/08/2021   HDL 27 (L) 12/08/2021   LDLCALC 92 12/08/2021   TRIG 147 12/08/2021   CHOLHDL 5.4 (H) 12/08/2021   Lab Results  Component Value Date   NA 137 12/08/2021   K 4.4 12/08/2021   CREATININE 1.11 12/08/2021   EGFR 71 12/08/2021     ---------------------------------------------------------------------------------------------------   Medications: Outpatient Medications Prior to Visit  Medication Sig   acetaminophen (TYLENOL) 500 MG tablet Take 1,000 mg by mouth every 6 (six)  hours as needed for mild pain or moderate pain.   acetaminophen (TYLENOL) 500 MG tablet Take by mouth.   amLODipine (NORVASC) 5 MG tablet TAKE 1 TABLET BY MOUTH DAILY   aspirin 81 MG tablet Take 81 mg by mouth daily.    atorvastatin (LIPITOR) 80 MG tablet TAKE ONE (1) TABLET EACH DAY   blood glucose meter kit and supplies KIT Dispense based on patient and insurance preference. Check sugars once daily   Cholecalciferol (VITAMIN D3 PO) Take 1 tablet by mouth daily.   clopidogrel (PLAVIX) 75 MG tablet TAKE 1 TABLET BY MOUTH DAILY   Dulaglutide (TRULICITY) 4.5 XB/2.8UX SOPN Inject 4.5 mg as directed once a week.   DULoxetine (CYMBALTA) 20 MG capsule Take 20 mg by mouth daily.   ezetimibe (ZETIA) 10 MG tablet Take 1 tablet (10 mg total) by mouth daily.   fenofibrate (TRICOR) 145 MG tablet TAKE 1 TABLET(145 MG) BY MOUTH DAILY   furosemide (LASIX) 20 MG tablet Take 20 mg by mouth daily.   glucose blood (ONETOUCH VERIO) test strip TEST BLOOD GLUCOSE EVERY DAY   hydrochlorothiazide (HYDRODIURIL) 25 MG tablet TAKE 1 TABLET BY MOUTH DAILY   JARDIANCE 25 MG TABS tablet TAKE 1 TABLET(25 MG) BY MOUTH DAILY   Lidocaine 4 % PTCH Apply 1 patch topically daily as needed (pain).   Liniments (BEN GAY EX) Apply 1 application topically daily as needed (pain).   lisinopril (ZESTRIL) 20 MG tablet Take 1 tablet (20 mg  total) by mouth daily.   metFORMIN (GLUCOPHAGE) 1000 MG tablet TAKE 1 TABLET BY MOUTH TWICE A DAY WITH FOOD   metoprolol tartrate (LOPRESSOR) 50 MG tablet TAKE 1 TABLET BY MOUTH TWICE A DAY   pantoprazole (PROTONIX) 40 MG tablet TAKE 1 TABLET BY MOUTH EVERY DAY AS NEEDED FOR HEARTBURN OR ACID REFLUX   tamsulosin (FLOMAX) 0.4 MG CAPS capsule Take 0.4 mg by mouth daily.   [DISCONTINUED] HYDROcodone-acetaminophen (NORCO) 5-325 MG tablet Take 1-2 tablets by mouth every 6 (six) hours as needed for moderate pain or severe pain.   [DISCONTINUED] oxyCODONE (OXY IR/ROXICODONE) 5 MG immediate release tablet Take  by mouth.   No facility-administered medications prior to visit.    Review of Systems per HPI      Objective    BP 111/75 (BP Location: Left Arm, Patient Position: Sitting, Cuff Size: Normal)   Pulse 75   Temp (!) 97.5 F (36.4 C) (Oral)   Resp 16   Wt 195 lb 12.8 oz (88.8 kg)   BMI 29.77 kg/m  BP Readings from Last 3 Encounters:  03/10/22 111/75  12/08/21 116/86  11/01/21 (!) 112/99   Wt Readings from Last 3 Encounters:  03/10/22 195 lb 12.8 oz (88.8 kg)  01/31/22 198 lb (89.8 kg)  12/08/21 198 lb 9.6 oz (90.1 kg)      Physical Exam Vitals reviewed.  Constitutional:      General: He is not in acute distress.    Appearance: Normal appearance. He is not diaphoretic.  HENT:     Head: Normocephalic and atraumatic.     Right Ear: There is impacted cerumen.     Left Ear: There is impacted cerumen.  Eyes:     General: No scleral icterus.    Conjunctiva/sclera: Conjunctivae normal.  Cardiovascular:     Rate and Rhythm: Normal rate and regular rhythm.     Pulses: Normal pulses.     Heart sounds: Normal heart sounds. No murmur heard. Pulmonary:     Effort: Pulmonary effort is normal. No respiratory distress.     Breath sounds: Normal breath sounds. No wheezing or rhonchi.  Abdominal:     General: There is no distension.     Palpations: Abdomen is soft.     Tenderness: There is no abdominal tenderness.  Musculoskeletal:     Cervical back: Neck supple.     Right lower leg: No edema.     Left lower leg: No edema.  Lymphadenopathy:     Cervical: No cervical adenopathy.  Skin:    General: Skin is warm and dry.     Capillary Refill: Capillary refill takes less than 2 seconds.     Findings: No rash.  Neurological:     Mental Status: He is alert and oriented to person, place, and time.     Cranial Nerves: No cranial nerve deficit.  Psychiatric:        Mood and Affect: Mood normal.        Behavior: Behavior normal.      Results for orders placed or performed in  visit on 03/10/22  POCT glycosylated hemoglobin (Hb A1C)  Result Value Ref Range   Hemoglobin A1C 7.2 (A) 4.0 - 5.6 %   Est. average glucose Bld gHb Est-mCnc 160     Assessment & Plan     Problem List Items Addressed This Visit       Cardiovascular and Mediastinum   Hypertension associated with diabetes ([place])    Well controlled Continue  current medications Recheck metabolic panel F/u in 6 months         Endocrine   Hyperlipidemia associated with type 2 diabetes mellitus ([place])    Previously well controlled Continue statin, zetia Repeat FLP and CMP at next visit Goal LDL < 70      T2DM (type 2 diabetes mellitus) ([place]) - Primary    Improving A1c, but not quite to goal Declines any med changes today as he is making lifestyle changes Continue current medications uACR today On ACEi/ARB On Statin  Discussed diet and exercise F/u in 6 months       Relevant Orders   POCT glycosylated hemoglobin (Hb A1C) (Completed)   Urine Microalbumin w/creat. ratio     Musculoskeletal and Integument   Spondylosis of lumbar region without myelopathy or radiculopathy    Chronic and stable Refilled pain meds       Relevant Medications   HYDROcodone-acetaminophen (NORCO) 5-325 MG tablet     Genitourinary   CKD (chronic kidney disease) stage 2, GFR 60-89 ml/min    Last BMP with improved GFR Followed annually by Nephrology         Other   Overweight    Discussed importance of healthy weight management Discussed diet and exercise       Other Visit Diagnoses     Bilateral impacted cerumen          Ears flushed by CMA at end of visit  Return in about 3 months (around 06/10/2022) for chronic disease f/u.      I, Lavon Paganini, MD, have reviewed all documentation for this visit. The documentation on 03/10/22 for the exam, diagnosis, procedures, and orders are all accurate and complete.   Wayland Baik, Dionne Bucy, MD, MPH [facility]

## 2022-03-10 ENCOUNTER — Ambulatory Visit (INDEPENDENT_AMBULATORY_CARE_PROVIDER_SITE_OTHER): Payer: No Typology Code available for payment source | Admitting: Family Medicine

## 2022-03-10 ENCOUNTER — Encounter: Payer: Self-pay | Admitting: Family Medicine

## 2022-03-10 VITALS — BP 111/75 | HR 75 | Temp 97.5°F | Resp 16 | Wt 195.8 lb

## 2022-03-10 DIAGNOSIS — N182 Chronic kidney disease, stage 2 (mild): Secondary | ICD-10-CM

## 2022-03-10 DIAGNOSIS — E1159 Type 2 diabetes mellitus with other circulatory complications: Secondary | ICD-10-CM | POA: Diagnosis not present

## 2022-03-10 DIAGNOSIS — M47816 Spondylosis without myelopathy or radiculopathy, lumbar region: Secondary | ICD-10-CM | POA: Diagnosis not present

## 2022-03-10 DIAGNOSIS — I152 Hypertension secondary to endocrine disorders: Secondary | ICD-10-CM | POA: Diagnosis not present

## 2022-03-10 DIAGNOSIS — H6123 Impacted cerumen, bilateral: Secondary | ICD-10-CM | POA: Diagnosis not present

## 2022-03-10 DIAGNOSIS — E1169 Type 2 diabetes mellitus with other specified complication: Secondary | ICD-10-CM

## 2022-03-10 DIAGNOSIS — E785 Hyperlipidemia, unspecified: Secondary | ICD-10-CM | POA: Diagnosis not present

## 2022-03-10 DIAGNOSIS — E663 Overweight: Secondary | ICD-10-CM

## 2022-03-10 DIAGNOSIS — N1832 Chronic kidney disease, stage 3b: Secondary | ICD-10-CM

## 2022-03-10 LAB — POCT GLYCOSYLATED HEMOGLOBIN (HGB A1C)
Est. average glucose Bld gHb Est-mCnc: 160
Hemoglobin A1C: 7.2 % — AB (ref 4.0–5.6)

## 2022-03-10 MED ORDER — HYDROCODONE-ACETAMINOPHEN 5-325 MG PO TABS: 1.0000 | ORAL_TABLET | Freq: Four times a day (QID) | ORAL | 0 refills | Status: DC | PRN

## 2022-03-10 NOTE — Assessment & Plan Note (Signed)
Previously well controlled Continue statin, zetia Repeat FLP and CMP at next visit Goal LDL < 70

## 2022-03-10 NOTE — Assessment & Plan Note (Signed)
Chronic and stable Refilled pain meds

## 2022-03-10 NOTE — Assessment & Plan Note (Signed)
Last BMP with improved GFR Followed annually by Nephrology

## 2022-03-10 NOTE — Assessment & Plan Note (Signed)
Well controlled Continue current medications Recheck metabolic panel F/u in 6 months  

## 2022-03-10 NOTE — Assessment & Plan Note (Signed)
Discussed importance of healthy weight management Discussed diet and exercise  

## 2022-03-10 NOTE — Assessment & Plan Note (Signed)
Improving A1c, but not quite to goal Declines any med changes today as he is making lifestyle changes Continue current medications uACR today On ACEi/ARB On Statin  Discussed diet and exercise F/u in 6 months

## 2022-03-11 LAB — MICROALBUMIN / CREATININE URINE RATIO
Creatinine, Urine: 134.8 mg/dL
Microalb/Creat Ratio: 4 mg/g creat (ref 0–29)
Microalbumin, Urine: 5.5 ug/mL

## 2022-03-11 LAB — SPECIMEN STATUS REPORT

## 2022-04-11 ENCOUNTER — Other Ambulatory Visit: Payer: Self-pay | Admitting: Family Medicine

## 2022-04-11 NOTE — Telephone Encounter (Signed)
Medication Refill - Medication: HYDROcodone-acetaminophen (NORCO) 5-325 MG tablet   Has the patient contacted their pharmacy? Yes.   (Agent: If no, request that the patient contact the pharmacy for the refill. If patient does not wish to contact the pharmacy document the reason why and proceed with request.) (Agent: If yes, when and what did the pharmacy advise?)  Preferred Pharmacy (with phone number or street name):  [facility] #[id number] [place], [place] - [address] AT [facility] OF [address] & [facility]  [address] [place] [address]  Phone: [phone] Fax: [phone]   Has the patient been seen for an appointment in the last year OR does the patient have an upcoming appointment? Yes.    Agent: Please be advised that RX refills may take up to 3 business days. We ask that you follow-up with your pharmacy.

## 2022-04-12 NOTE — Telephone Encounter (Signed)
Requested medication (s) are due for refill today: yes  Requested medication (s) are on the active medication list: yes  Last refill:  03/10/22  Future visit scheduled yes  Notes to clinic:  Unable to refill per protocol, cannot delegate.      Requested Prescriptions  Pending Prescriptions Disp Refills   HYDROcodone-acetaminophen (NORCO) 5-325 MG tablet 60 tablet 0    Sig: Take 1-2 tablets by mouth every 6 (six) hours as needed for moderate pain or severe pain.     Not Delegated - Analgesics:  Opioid Agonist Combinations Failed - 04/11/2022  3:10 PM      Failed - This refill cannot be delegated      Failed - Urine Drug Screen completed in last 360 days      Passed - Valid encounter within last 3 months    Recent Outpatient Visits           1 month ago Type 2 diabetes mellitus with other specified complication, without long-term current use of insulin [facility])   [facility] [place], Marzella Schlein, MD   4 months ago Type 2 diabetes mellitus with other specified complication, without long-term current use of insulin [facility])   [facility] [place], Marzella Schlein, MD   7 months ago Type 2 diabetes mellitus with other specified complication, without long-term current use of insulin [facility])   [facility] [place], Marzella Schlein, MD   9 months ago Acute bilateral low back pain without sciatica   [facility] Merita Norton T, FNP   10 months ago Type 2 diabetes mellitus with other specified complication, without long-term current use of insulin [facility])   [facility] [place], Marzella Schlein, MD       Future Appointments             In 2 months Rumball, Darl Householder, DO [facility], [facility]

## 2022-04-13 MED ORDER — HYDROCODONE-ACETAMINOPHEN 5-325 MG PO TABS: 1.0000 | ORAL_TABLET | Freq: Four times a day (QID) | ORAL | 0 refills | Status: DC | PRN

## 2022-04-20 ENCOUNTER — Other Ambulatory Visit: Payer: Self-pay | Admitting: Family Medicine

## 2022-04-20 DIAGNOSIS — K219 Gastro-esophageal reflux disease without esophagitis: Secondary | ICD-10-CM

## 2022-04-20 DIAGNOSIS — I2510 Atherosclerotic heart disease of native coronary artery without angina pectoris: Secondary | ICD-10-CM

## 2022-04-20 DIAGNOSIS — E1159 Type 2 diabetes mellitus with other circulatory complications: Secondary | ICD-10-CM

## 2022-04-20 DIAGNOSIS — E1165 Type 2 diabetes mellitus with hyperglycemia: Secondary | ICD-10-CM

## 2022-04-20 NOTE — Telephone Encounter (Signed)
Requested Prescriptions  Pending Prescriptions Disp Refills  . lisinopril (ZESTRIL) 20 MG tablet [Pharmacy Med Name: LISINOPRIL 20 MG TABLET] 90 tablet 1    Sig: TAKE 1 TABLET BY MOUTH EVERY DAY     Cardiovascular:  ACE Inhibitors Passed - 04/20/2022  3:34 PM      Passed - Cr in normal range and within 180 days    Creatinine, Ser  Date Value Ref Range Status  12/08/2021 1.11 0.76 - 1.27 mg/dL Final         Passed - K in normal range and within 180 days    Potassium  Date Value Ref Range Status  12/08/2021 4.4 3.5 - 5.2 mmol/L Final         Passed - Patient is not pregnant      Passed - Last BP in normal range    BP Readings from Last 1 Encounters:  03/10/22 111/75         Passed - Valid encounter within last 6 months    Recent Outpatient Visits          1 month ago Type 2 diabetes mellitus with other specified complication, without long-term current use of insulin ([place])   [facility] [place], Dionne Bucy, MD   4 months ago Type 2 diabetes mellitus with other specified complication, without long-term current use of insulin [facility])   [facility] [place], Dionne Bucy, MD   7 months ago Type 2 diabetes mellitus with other specified complication, without long-term current use of insulin [facility])   [facility] [place], Dionne Bucy, MD   10 months ago Acute bilateral low back pain without sciatica   [facility] Tally Joe T, FNP   10 months ago Type 2 diabetes mellitus with other specified complication, without long-term current use of insulin [facility])   [facility] [place], Dionne Bucy, MD      Future Appointments            In 1 month [place], Jake Church, DO [facility], [facility]           . pantoprazole (PROTONIX) 40 MG tablet [Pharmacy Med Name: PANTOPRAZOLE SOD DR 40 MG TAB] 90 tablet 0    Sig: TAKE 1 TABLET BY MOUTH EVERY DAY AS NEEDED FOR HEARTBURN OR ACID REFLUX     Gastroenterology:  Proton Pump Inhibitors Passed - 04/20/2022  3:34 PM      Passed - Valid encounter within last 12 months    Recent Outpatient Visits          1 month ago Type 2 diabetes mellitus with other specified complication, without long-term current use of insulin [facility])   [facility] [place], Dionne Bucy, MD   4 months ago Type 2 diabetes mellitus with other specified complication, without long-term current use of insulin [facility])   [facility] [place], Dionne Bucy, MD   7 months ago Type 2 diabetes mellitus with other specified complication, without long-term current use of insulin [facility])   [facility] [place], Dionne Bucy, MD   10 months ago Acute bilateral low back pain without sciatica   [facility] Tally Joe T, FNP   10 months ago Type 2 diabetes mellitus with other specified complication, without long-term current use of insulin [facility])   [facility], Dionne Bucy, MD      Future Appointments            In 1 month Myles Gip,  DO [place], [facility]           . amLODipine (NORVASC) 5 MG tablet [Pharmacy Med Name: AMLODIPINE BESYLATE 5 MG TAB] 90 tablet 0    Sig: TAKE 1 TABLET BY MOUTH EVERY DAY     Cardiovascular: Calcium Channel Blockers 2 Passed - 04/20/2022  3:34 PM      Passed - Last BP in normal range    BP Readings from Last 1 Encounters:  03/10/22 111/75         Passed - Last Heart Rate in normal range    Pulse Readings from Last 1 Encounters:  03/10/22 75         Passed - Valid encounter within last 6 months    Recent Outpatient Visits          1 month ago Type 2 diabetes mellitus with other specified complication, without long-term current use of insulin ([place])   [facility] [place], Dionne Bucy, MD   4 months ago Type 2 diabetes mellitus with other specified complication, without long-term current use of insulin [facility])   [facility] [place],  Dionne Bucy, MD   7 months ago Type 2 diabetes mellitus with other specified complication, without long-term current use of insulin [facility]   [facility] [place], Dionne Bucy, MD   10 months ago Acute bilateral low back pain without sciatica   [facility] Tally Joe T, FNP   10 months ago Type 2 diabetes mellitus with other specified complication, without long-term current use of insulin [facility])   [facility] [place], Dionne Bucy, MD      Future Appointments            In 1 month [place], Jake Church, DO [facility] [place]           . clopidogrel (PLAVIX) 75 MG tablet [Pharmacy Med Name: CLOPIDOGREL 75 MG TABLET] 90 tablet 1    Sig: TAKE 1 TABLET BY MOUTH EVERY DAY     Hematology: Antiplatelets - clopidogrel Failed - 04/20/2022  3:34 PM      Failed - HCT in normal range and within 180 days    HCT  Date Value Ref Range Status  10/21/2021 42.1 39.0 - 52.0 % Final   Hematocrit  Date Value Ref Range Status  02/25/2021 45.5 37.5 - 51.0 % Final         Failed - HGB in normal range and within 180 days    Hemoglobin  Date Value Ref Range Status  10/21/2021 14.1 13.0 - 17.0 g/dL Final  02/25/2021 14.8 13.0 - 17.7 g/dL Final         Failed - PLT in normal range and within 180 days    Platelets  Date Value Ref Range Status  10/21/2021 243 150 - 400 K/uL Final  02/25/2021 274 150 - 450 x10E3/uL Final         Passed - Cr in normal range and within 360 days    Creatinine, Ser  Date Value Ref Range Status  12/08/2021 1.11 0.76 - 1.27 mg/dL Final         Passed - Valid encounter within last 6 months    Recent Outpatient Visits          1 month ago Type 2 diabetes mellitus with other specified complication, without long-term current use of insulin [facility])   [facility] [place], Dionne Bucy, MD   4 months ago Type 2 diabetes mellitus with  other specified complication, without long-term current use of insulin ([place])    [facility] [place], Dionne Bucy, MD   7 months ago Type 2 diabetes mellitus with other specified complication, without long-term current use of insulin [facility])   [facility] [place], Dionne Bucy, MD   10 months ago Acute bilateral low back pain without sciatica   [facility] Tally Joe T, FNP   10 months ago Type 2 diabetes mellitus with other specified complication, without long-term current use of insulin ([place])   [facility], Dionne Bucy, MD      Future Appointments            In 1 month Rumball, Jake Church, DO [place], [facility]           . metoprolol tartrate (LOPRESSOR) 50 MG tablet [Pharmacy Med Name: METOPROLOL TARTRATE 50 MG TAB] 180 tablet 0    Sig: TAKE 1 TABLET BY MOUTH TWICE A DAY     Cardiovascular:  Beta Blockers Passed - 04/20/2022  3:34 PM      Passed - Last BP in normal range    BP Readings from Last 1 Encounters:  03/10/22 111/75         Passed - Last Heart Rate in normal range    Pulse Readings from Last 1 Encounters:  03/10/22 75         Passed - Valid encounter within last 6 months    Recent Outpatient Visits          1 month ago Type 2 diabetes mellitus with other specified complication, without long-term current use of insulin ([place])   [facility] [place], Dionne Bucy, MD   4 months ago Type 2 diabetes mellitus with other specified complication, without long-term current use of insulin ([place])   [facility] [place], Dionne Bucy, MD   7 months ago Type 2 diabetes mellitus with other specified complication, without long-term current use of insulin [facility])   [facility] [place], Dionne Bucy, MD   10 months ago Acute bilateral low back pain without sciatica   [facility] Tally Joe T, FNP   10 months ago Type 2 diabetes mellitus with other specified complication, without long-term current use of insulin ([place])    [facility] [place], Dionne Bucy, MD      Future Appointments            In 1 month Rumball, Jake Church, [place], [facility]           . metFORMIN (GLUCOPHAGE) 1000 MG tablet [Pharmacy Med Name: METFORMIN HCL 1,000 MG TABLET] 180 tablet 0    Sig: TAKE 1 TABLET BY MOUTH TWICE A DAY WITH FOOD     Endocrinology:  Diabetes - Biguanides Failed - 04/20/2022  3:34 PM      Failed - B12 Level in normal range and within 720 days    Vitamin B-12  Date Value Ref Range Status  08/09/2020 139 (L) 232 - 1,245 pg/mL Final         Failed - CBC within normal limits and completed in the last 12 months    WBC  Date Value Ref Range Status  10/21/2021 7.2 4.0 - 10.5 K/uL Final   RBC  Date Value Ref Range Status  10/21/2021 4.84 4.22 - 5.81 MIL/uL Final   Hemoglobin  Date Value Ref Range Status  10/21/2021 14.1 13.0 - 17.0 g/dL Final  02/25/2021 14.8 13.0 -  17.7 g/dL Final   HCT  Date Value Ref Range Status  10/21/2021 42.1 39.0 - 52.0 % Final   Hematocrit  Date Value Ref Range Status  02/25/2021 45.5 37.5 - 51.0 % Final   MCHC  Date Value Ref Range Status  10/21/2021 33.5 30.0 - 36.0 g/dL Final   [facility]  Date Value Ref Range Status  10/21/2021 29.1 26.0 - 34.0 pg Final   MCV  Date Value Ref Range Status  10/21/2021 87.0 80.0 - 100.0 fL Final  02/25/2021 89 79 - 97 fL Final   No results found for: "PLTCOUNTKUC", "LABPLAT", "POCPLA" RDW  Date Value Ref Range Status  10/21/2021 15.2 11.5 - 15.5 % Final  02/25/2021 13.9 11.6 - 15.4 % Final         Passed - Cr in normal range and within 360 days    Creatinine, Ser  Date Value Ref Range Status  12/08/2021 1.11 0.76 - 1.27 mg/dL Final         Passed - HBA1C is between 0 and 7.9 and within 180 days    Hemoglobin A1C  Date Value Ref Range Status  03/10/2022 7.2 (A) 4.0 - 5.6 % Final  07/18/2021 8.1  Final   Hgb A1c MFr Bld  Date Value Ref Range Status  12/08/2021 7.6 (H) 4.8 - 5.6 % Final     Comment:             Prediabetes: 5.7 - 6.4          Diabetes: >6.4          Glycemic control for adults with diabetes: <7.0          Passed - eGFR in normal range and within 360 days    GFR calc Af Amer  Date Value Ref Range Status  08/09/2020 82 >59 mL/min/1.73 Final    Comment:    **In accordance with recommendations from the NKF-ASN Task force,**   [facility] is in the process of updating its eGFR calculation to the   2021 CKD-EPI creatinine equation that estimates kidney function   without a race variable.    GFR, Estimated  Date Value Ref Range Status  10/21/2021 56 (L) >60 mL/min Final    Comment:    (NOTE) Calculated using the CKD-EPI Creatinine Equation (2021)    eGFR  Date Value Ref Range Status  12/08/2021 71 >59 mL/min/1.73 Final         Passed - Valid encounter within last 6 months    Recent Outpatient Visits          1 month ago Type 2 diabetes mellitus with other specified complication, without long-term current use of insulin [facility])   [facility] [place], Dionne Bucy, MD   4 months ago Type 2 diabetes mellitus with other specified complication, without long-term current use of insulin [facility])   [facility] [place], Dionne Bucy, MD   7 months ago Type 2 diabetes mellitus with other specified complication, without long-term current use of insulin [facility])   [facility] [place], Dionne Bucy, MD   10 months ago Acute bilateral low back pain without sciatica   [facility] Tally Joe T, FNP   10 months ago Type 2 diabetes mellitus with other specified complication, without long-term current use of insulin [facility])   [place], Dionne Bucy, MD      Future Appointments            In 1 month Rumball,  Jake Church, DO [facility], [facility]           . hydrochlorothiazide (HYDRODIURIL) 25 MG tablet [Pharmacy Med Name: HYDROCHLOROTHIAZIDE 25 MG TAB] 90 tablet 0    Sig: TAKE 1  TABLET BY MOUTH EVERY DAY     Cardiovascular: Diuretics - Thiazide Passed - 04/20/2022  3:34 PM      Passed - Cr in normal range and within 180 days    Creatinine, Ser  Date Value Ref Range Status  12/08/2021 1.11 0.76 - 1.27 mg/dL Final         Passed - K in normal range and within 180 days    Potassium  Date Value Ref Range Status  12/08/2021 4.4 3.5 - 5.2 mmol/L Final         Passed - Na in normal range and within 180 days    Sodium  Date Value Ref Range Status  12/08/2021 137 134 - 144 mmol/L Final         Passed - Last BP in normal range    BP Readings from Last 1 Encounters:  03/10/22 111/75         Passed - Valid encounter within last 6 months    Recent Outpatient Visits          1 month ago Type 2 diabetes mellitus with other specified complication, without long-term current use of insulin [facility])   [facility] [place], Dionne Bucy, MD   4 months ago Type 2 diabetes mellitus with other specified complication, without long-term current use of insulin [facility])   [facility] [place], Dionne Bucy, MD   7 months ago Type 2 diabetes mellitus with other specified complication, without long-term current use of insulin [facility])   [facility] [place], Dionne Bucy, MD   10 months ago Acute bilateral low back pain without sciatica   [facility] Tally Joe T, FNP   10 months ago Type 2 diabetes mellitus with other specified complication, without long-term current use of insulin [facility])   [place], Dionne Bucy, MD      Future Appointments            In 1 month [place], Jake Church, DO [facility], [place]

## 2022-05-08 ENCOUNTER — Other Ambulatory Visit: Payer: Self-pay | Admitting: Family Medicine

## 2022-05-08 DIAGNOSIS — E1169 Type 2 diabetes mellitus with other specified complication: Secondary | ICD-10-CM

## 2022-05-08 NOTE — Telephone Encounter (Signed)
Refill request for Atorvastatin Calcium 80 MG by [facility] -

## 2022-05-09 ENCOUNTER — Other Ambulatory Visit: Payer: Self-pay | Admitting: Family Medicine

## 2022-05-09 DIAGNOSIS — E1169 Type 2 diabetes mellitus with other specified complication: Secondary | ICD-10-CM

## 2022-05-09 MED ORDER — ATORVASTATIN CALCIUM 80 MG PO TABS: ORAL_TABLET | ORAL | 1 refills | Status: DC

## 2022-05-09 NOTE — Telephone Encounter (Signed)
Requested Prescriptions  Pending Prescriptions Disp Refills  . atorvastatin (LIPITOR) 80 MG tablet 90 tablet 1    Sig: TAKE ONE (1) TABLET EACH DAY     Cardiovascular:  Antilipid - Statins Failed - 05/08/2022 12:31 PM      Failed - Lipid Panel in normal range within the last 12 months    Cholesterol, Total  Date Value Ref Range Status  12/08/2021 145 100 - 199 mg/dL Final   LDL Chol Calc (NIH)  Date Value Ref Range Status  12/08/2021 92 0 - 99 mg/dL Final   HDL  Date Value Ref Range Status  12/08/2021 27 (L) >39 mg/dL Final   Triglycerides  Date Value Ref Range Status  12/08/2021 147 0 - 149 mg/dL Final         Passed - Patient is not pregnant      Passed - Valid encounter within last 12 months    Recent Outpatient Visits          2 months ago Type 2 diabetes mellitus with other specified complication, without long-term current use of insulin (HCC)   [facility] [place], Marzella Schlein, MD   5 months ago Type 2 diabetes mellitus with other specified complication, without long-term current use of insulin [facility])   [facility] [place], Marzella Schlein, MD   8 months ago Type 2 diabetes mellitus with other specified complication, without long-term current use of insulin [facility])   [facility], Marzella Schlein, MD   10 months ago Acute bilateral low back pain without sciatica   [facility] Merita Norton T, FNP   11 months ago Type 2 diabetes mellitus with other specified complication, without long-term current use of insulin [facility])   [facility] [place], Marzella Schlein, MD      Future Appointments            In 1 month Rumball, Darl Householder, DO [facility], [facility]

## 2022-05-12 ENCOUNTER — Other Ambulatory Visit: Payer: Self-pay | Admitting: Family Medicine

## 2022-05-12 NOTE — Telephone Encounter (Signed)
Requested medication (s) are due for refill today: yes  Requested medication (s) are on the active medication list: yes  Last refill:  04/13/22 #60 0 refills  Future visit scheduled: yes in 1 month  Notes to clinic:  not delegated per protocol     Requested Prescriptions  Pending Prescriptions Disp Refills   HYDROcodone-acetaminophen (NORCO) 5-325 MG tablet 60 tablet 0    Sig: Take 1-2 tablets by mouth every 6 (six) hours as needed for moderate pain or severe pain.     Not Delegated - Analgesics:  Opioid Agonist Combinations Failed - 05/12/2022  5:11 PM      Failed - This refill cannot be delegated      Failed - Urine Drug Screen completed in last 360 days      Passed - Valid encounter within last 3 months    Recent Outpatient Visits           2 months ago Type 2 diabetes mellitus with other specified complication, without long-term current use of insulin [facility])   [facility] [place], Marzella Schlein, MD   5 months ago Type 2 diabetes mellitus with other specified complication, without long-term current use of insulin [facility])   [facility] [place], Marzella Schlein, MD   8 months ago Type 2 diabetes mellitus with other specified complication, without long-term current use of insulin [facility])   [facility] [place], Marzella Schlein, MD   10 months ago Acute bilateral low back pain without sciatica   [facility] Merita Norton T, FNP   11 months ago Type 2 diabetes mellitus with other specified complication, without long-term current use of insulin [facility])   [facility] [place], Marzella Schlein, MD       Future Appointments             In 1 month Rumball, Darl Householder, DO [facility], [facility]

## 2022-05-12 NOTE — Telephone Encounter (Signed)
Medication Refill - Medication:HYDROcodone-acetaminophen (NORCO) 5-325 MG tablet [[id number]]   Has the patient contacted their pharmacy? Yes.   (Agent: If no, request that the patient contact the pharmacy for the refill. If patient does not wish to contact the pharmacy document the reason why and proceed with request.) (Agent: If yes, when and what did the pharmacy advise?)  Preferred Pharmacy (with phone number or street name):  [id number] [place], [place] - [address] AT [facility] [address] [place]  [address] [place] [address]  Phone: [phone] Fax: [phone]  Hours: Not open 24 hours   Has the patient been seen for an appointment in the last year OR does the patient have an upcoming appointment? Yes.    Agent: Please be advised that RX refills may take up to 3 business days. We ask that you follow-up with your pharmacy.

## 2022-05-15 MED ORDER — HYDROCODONE-ACETAMINOPHEN 5-325 MG PO TABS: 1.0000 | ORAL_TABLET | Freq: Four times a day (QID) | ORAL | 0 refills | Status: DC | PRN

## 2022-05-15 NOTE — Telephone Encounter (Signed)
The patients daughter, Aggie Cosier called in to check on the status of his pain medicine that was called in on Friday at the end of the day. Please assist further.

## 2022-06-05 ENCOUNTER — Other Ambulatory Visit: Payer: Self-pay | Admitting: Family Medicine

## 2022-06-05 ENCOUNTER — Telehealth: Payer: Self-pay | Admitting: Family Medicine

## 2022-06-05 DIAGNOSIS — K219 Gastro-esophageal reflux disease without esophagitis: Secondary | ICD-10-CM

## 2022-06-05 MED ORDER — LISINOPRIL 20 MG PO TABS: 20.0000 mg | ORAL_TABLET | Freq: Every day | ORAL | 0 refills | Status: DC

## 2022-06-05 NOTE — Telephone Encounter (Signed)
[facility] faxed refill request for the following medications: ? ?lisinopril (ZESTRIL) 20 MG tablet  ? ?Please advise. ? ?

## 2022-06-05 NOTE — Telephone Encounter (Signed)
I feel like refill request is too soon?

## 2022-06-12 ENCOUNTER — Encounter: Payer: Self-pay | Admitting: Family Medicine

## 2022-06-12 ENCOUNTER — Ambulatory Visit (INDEPENDENT_AMBULATORY_CARE_PROVIDER_SITE_OTHER): Payer: No Typology Code available for payment source | Admitting: Family Medicine

## 2022-06-12 VITALS — BP 130/79 | HR 76 | Temp 97.7°F | Resp 16 | Ht 68.0 in | Wt 198.2 lb

## 2022-06-12 DIAGNOSIS — E785 Hyperlipidemia, unspecified: Secondary | ICD-10-CM

## 2022-06-12 DIAGNOSIS — I152 Hypertension secondary to endocrine disorders: Secondary | ICD-10-CM | POA: Diagnosis not present

## 2022-06-12 DIAGNOSIS — M47816 Spondylosis without myelopathy or radiculopathy, lumbar region: Secondary | ICD-10-CM

## 2022-06-12 DIAGNOSIS — E1169 Type 2 diabetes mellitus with other specified complication: Secondary | ICD-10-CM

## 2022-06-12 DIAGNOSIS — E1159 Type 2 diabetes mellitus with other circulatory complications: Secondary | ICD-10-CM

## 2022-06-12 DIAGNOSIS — Z955 Presence of coronary angioplasty implant and graft: Secondary | ICD-10-CM

## 2022-06-12 DIAGNOSIS — I2510 Atherosclerotic heart disease of native coronary artery without angina pectoris: Secondary | ICD-10-CM

## 2022-06-12 DIAGNOSIS — N182 Chronic kidney disease, stage 2 (mild): Secondary | ICD-10-CM | POA: Diagnosis not present

## 2022-06-12 MED ORDER — HYDROCODONE-ACETAMINOPHEN 5-325 MG PO TABS: 1.0000 | ORAL_TABLET | Freq: Four times a day (QID) | ORAL | 0 refills | Status: DC | PRN

## 2022-06-12 NOTE — Assessment & Plan Note (Signed)
At goal today. Continue current medications. 

## 2022-06-12 NOTE — Assessment & Plan Note (Signed)
Tolerant of statin, continue. 

## 2022-06-12 NOTE — Progress Notes (Signed)
   SUBJECTIVE:   CHIEF COMPLAINT / HPI:   Hypertension, HLD, CAD sp stent: - Medications: amlodipine, HCTZ, metoprolol, lasix, lisinopril, ASA 81mg , lipitor, plavix, fenofibrate, zetia - Compliance: good - Checking BP at home: yes, 110s SBP - Denies any SOB, CP, vision changes, LE edema, medication SEs, or symptoms of hypotension  Diabetes, Type 2 - Last A1c 7.2 02/2022 - Medications: metformin, jardiance, trulicity - Compliance: good - Checking BG at home: yes, 110-140s. - Diet: occasional high carb foods.  - Exercise: walks, watches grandkid - Eye exam: UTD - Foot exam: UTD - Microalbumin: UTD - Statin: yes - PNA vaccine: UTD - Denies symptoms of hypoglycemia, polyuria, polydipsia, numbness extremities, foot ulcers/trauma  Benign Prostatic Hypertrophy - Medications: flomax - Symptoms: nocturia one time a night  - Denies: straining and weak stream, no complicating symptoms - no personal history and no family history of prostate cancer   OBJECTIVE:   BP 130/79 (BP Location: Left Arm, Patient Position: Sitting, Cuff Size: Normal)   Pulse 76   Temp 97.7 F (36.5 C) (Oral)   Resp 16   Ht 5\' 8"  (1.727 m)   Wt 198 lb 3.2 oz (89.9 kg)   BMI 30.14 kg/m   Gen: well appearing, in NAD Card: RRR Lungs: CTAB Ext: WWP, no edema   ASSESSMENT/PLAN:   Hypertension associated with diabetes (HCC) At goal today. Continue current medications.   CAD in native artery Asymptomatic. Continue to manage risk factors. Continue to follow with Cardiology.  T2DM (type 2 diabetes mellitus) (HCC) Recheck a1c and adjust regimen as indicated.  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 Able to remain functional with opiods. PDMP reviewed with appropriate use, refilled today.  Hyperlipidemia associated with type 2 diabetes mellitus (HCC) Tolerant of statin, continue.     03/2022, DO

## 2022-06-12 NOTE — Assessment & Plan Note (Signed)
Recheck a1c and adjust regimen as indicated. °

## 2022-06-12 NOTE — Assessment & Plan Note (Signed)
Chronic, stable. Able to remain functional with opiods. PDMP reviewed with appropriate use, refilled today.

## 2022-06-12 NOTE — Assessment & Plan Note (Signed)
Asymptomatic. Continue to manage risk factors. Continue to follow with Cardiology.

## 2022-06-13 LAB — BASIC METABOLIC PANEL
BUN/Creatinine Ratio: 18 (ref 10–24)
BUN: 21 mg/dL (ref 8–27)
CO2: 23 mmol/L (ref 20–29)
Calcium: 9.9 mg/dL (ref 8.6–10.2)
Chloride: 102 mmol/L (ref 96–106)
Creatinine, Ser: 1.14 mg/dL (ref 0.76–1.27)
Glucose: 127 mg/dL — ABNORMAL HIGH (ref 70–99)
Potassium: 3.7 mmol/L (ref 3.5–5.2)
Sodium: 141 mmol/L (ref 134–144)
eGFR: 68 mL/min/{1.73_m2} (ref >59)

## 2022-06-13 LAB — HEMOGLOBIN A1C
Est. average glucose Bld gHb Est-mCnc: 177 mg/dL
Hgb A1c MFr Bld: 7.8 % — ABNORMAL HIGH (ref 4.8–5.6)

## 2022-07-01 ENCOUNTER — Other Ambulatory Visit: Payer: Self-pay | Admitting: Family Medicine

## 2022-07-01 DIAGNOSIS — I152 Hypertension secondary to endocrine disorders: Secondary | ICD-10-CM

## 2022-07-01 DIAGNOSIS — E1165 Type 2 diabetes mellitus with hyperglycemia: Secondary | ICD-10-CM

## 2022-07-01 DIAGNOSIS — I2510 Atherosclerotic heart disease of native coronary artery without angina pectoris: Secondary | ICD-10-CM

## 2022-07-11 ENCOUNTER — Other Ambulatory Visit: Payer: Self-pay | Admitting: Family Medicine

## 2022-07-11 DIAGNOSIS — K219 Gastro-esophageal reflux disease without esophagitis: Secondary | ICD-10-CM | POA: Diagnosis not present

## 2022-07-11 DIAGNOSIS — F17291 Nicotine dependence, other tobacco product, in remission: Secondary | ICD-10-CM | POA: Diagnosis not present

## 2022-07-11 DIAGNOSIS — N401 Enlarged prostate with lower urinary tract symptoms: Secondary | ICD-10-CM | POA: Diagnosis not present

## 2022-07-11 DIAGNOSIS — Z008 Encounter for other general examination: Secondary | ICD-10-CM | POA: Diagnosis not present

## 2022-07-11 DIAGNOSIS — E785 Hyperlipidemia, unspecified: Secondary | ICD-10-CM | POA: Diagnosis not present

## 2022-07-11 DIAGNOSIS — I700 Atherosclerosis of aorta: Secondary | ICD-10-CM | POA: Diagnosis not present

## 2022-07-11 DIAGNOSIS — E1122 Type 2 diabetes mellitus with diabetic chronic kidney disease: Secondary | ICD-10-CM | POA: Diagnosis not present

## 2022-07-11 DIAGNOSIS — F325 Major depressive disorder, single episode, in full remission: Secondary | ICD-10-CM | POA: Diagnosis not present

## 2022-07-11 DIAGNOSIS — E1169 Type 2 diabetes mellitus with other specified complication: Secondary | ICD-10-CM | POA: Diagnosis not present

## 2022-07-11 DIAGNOSIS — M47816 Spondylosis without myelopathy or radiculopathy, lumbar region: Secondary | ICD-10-CM

## 2022-07-11 DIAGNOSIS — E1159 Type 2 diabetes mellitus with other circulatory complications: Secondary | ICD-10-CM | POA: Diagnosis not present

## 2022-07-11 DIAGNOSIS — Z6826 Body mass index (BMI) 26.0-26.9, adult: Secondary | ICD-10-CM | POA: Diagnosis not present

## 2022-07-11 DIAGNOSIS — I129 Hypertensive chronic kidney disease with stage 1 through stage 4 chronic kidney disease, or unspecified chronic kidney disease: Secondary | ICD-10-CM | POA: Diagnosis not present

## 2022-07-11 DIAGNOSIS — E663 Overweight: Secondary | ICD-10-CM | POA: Diagnosis not present

## 2022-07-11 DIAGNOSIS — Z79891 Long term (current) use of opiate analgesic: Secondary | ICD-10-CM | POA: Diagnosis not present

## 2022-07-11 DIAGNOSIS — N182 Chronic kidney disease, stage 2 (mild): Secondary | ICD-10-CM | POA: Diagnosis not present

## 2022-07-11 NOTE — Telephone Encounter (Signed)
Medication Refill - Medication: HYDROcodone-acetaminophen (NORCO) 5-325 MG tablet  Has the patient contacted their pharmacy? Yes.     (Agent: If no, request that the patient contact the pharmacy for the refill. If patient does not wish to contact the pharmacy document the reason why and proceed with request.) (Agent: If yes, when and what did the pharmacy advise?) call dr Festus [facility] [place], [place] - [place] Phone:  [phone]  Fax:  [phone]      Has the patient been seen for an appointment in the last year OR does the patient have an upcoming appointment? Yes.    Agent: Please be advised that RX refills may take up to 3 business days. We ask that you follow-up with your pharmacy.

## 2022-07-12 MED ORDER — HYDROCODONE-ACETAMINOPHEN 5-325 MG PO TABS: 1.0000 | ORAL_TABLET | Freq: Four times a day (QID) | ORAL | 0 refills | Status: DC | PRN

## 2022-07-12 NOTE — Telephone Encounter (Signed)
Requested medication (s) are due for refill today: yes  Requested medication (s) are on the active medication list: yes  Last refill:  06/12/22  Future visit scheduled: yes  Notes to clinic:  Unable to refill per protocol, cannot delegate.     Requested Prescriptions  Pending Prescriptions Disp Refills   HYDROcodone-acetaminophen (NORCO) 5-325 MG tablet 60 tablet 0    Sig: Take 1-2 tablets by mouth every 6 (six) hours as needed for moderate pain or severe pain.     Not Delegated - Analgesics:  Opioid Agonist Combinations Failed - 07/11/2022  5:42 PM      Failed - This refill cannot be delegated      Failed - Urine Drug Screen completed in last 360 days      Passed - Valid encounter within last 3 months    Recent Outpatient Visits           1 month ago CAD in native artery   [facility] Myles Gip, DO   4 months ago Type 2 diabetes mellitus with other specified complication, without long-term current use of insulin [facility])   [facility], Dionne Bucy, MD   7 months ago Type 2 diabetes mellitus with other specified complication, without long-term current use of insulin [facility])   [facility], Dionne Bucy, MD   10 months ago Type 2 diabetes mellitus with other specified complication, without long-term current use of insulin [facility])   [facility] [place], Dionne Bucy, MD   1 year ago Acute bilateral low back pain without sciatica   [facility] Gwyneth Sprout, FNP       Future Appointments             In 2 months Bacigalupo, Dionne Bucy, MD [facility], [facility]

## 2022-07-27 ENCOUNTER — Other Ambulatory Visit: Payer: Self-pay | Admitting: Family Medicine

## 2022-07-27 DIAGNOSIS — I2510 Atherosclerotic heart disease of native coronary artery without angina pectoris: Secondary | ICD-10-CM

## 2022-07-27 DIAGNOSIS — E1169 Type 2 diabetes mellitus with other specified complication: Secondary | ICD-10-CM

## 2022-07-27 NOTE — Telephone Encounter (Signed)
Requested Prescriptions  Pending Prescriptions Disp Refills  . fenofibrate (TRICOR) 145 MG tablet [Pharmacy Med Name: FENOFIBRATE 145 MG TABLET] 90 tablet 1    Sig: TAKE 1 TABLET BY MOUTH EVERY DAY     Cardiovascular:  Antilipid - Fibric Acid Derivatives Failed - 07/27/2022  5:43 PM      Failed - Lipid Panel in normal range within the last 12 months    Cholesterol, Total  Date Value Ref Range Status  12/08/2021 145 100 - 199 mg/dL Final   LDL Chol Calc (NIH)  Date Value Ref Range Status  12/08/2021 92 0 - 99 mg/dL Final   HDL  Date Value Ref Range Status  12/08/2021 27 (L) >39 mg/dL Final   Triglycerides  Date Value Ref Range Status  12/08/2021 147 0 - 149 mg/dL Final         Passed - ALT in normal range and within 360 days    ALT  Date Value Ref Range Status  12/08/2021 21 0 - 44 IU/L Final         Passed - AST in normal range and within 360 days    AST  Date Value Ref Range Status  12/08/2021 23 0 - 40 IU/L Final         Passed - Cr in normal range and within 360 days    Creatinine, Ser  Date Value Ref Range Status  06/12/2022 1.14 0.76 - 1.27 mg/dL Final         Passed - HGB in normal range and within 360 days    Hemoglobin  Date Value Ref Range Status  10/21/2021 14.1 13.0 - 17.0 g/dL Final  02/25/2021 14.8 13.0 - 17.7 g/dL Final         Passed - HCT in normal range and within 360 days    HCT  Date Value Ref Range Status  10/21/2021 42.1 39.0 - 52.0 % Final   Hematocrit  Date Value Ref Range Status  02/25/2021 45.5 37.5 - 51.0 % Final         Passed - PLT in normal range and within 360 days    Platelets  Date Value Ref Range Status  10/21/2021 243 150 - 400 K/uL Final  02/25/2021 274 150 - 450 x10E3/uL Final         Passed - WBC in normal range and within 360 days    WBC  Date Value Ref Range Status  10/21/2021 7.2 4.0 - 10.5 K/uL Final         Passed - eGFR is 30 or above and within 360 days    GFR calc Af Amer  Date Value Ref Range  Status  08/09/2020 82 >59 mL/min/1.73 Final    Comment:    **In accordance with recommendations from the NKF-ASN Task force,**   [facility] is in the process of updating its eGFR calculation to the   2021 CKD-EPI creatinine equation that estimates kidney function   without a race variable.    GFR, Estimated  Date Value Ref Range Status  10/21/2021 56 (L) >60 mL/min Final    Comment:    (NOTE) Calculated using the CKD-EPI Creatinine Equation (2021)    eGFR  Date Value Ref Range Status  06/12/2022 68 >59 mL/min/1.73 Final         Passed - Valid encounter within last 12 months    Recent Outpatient Visits          1 month ago CAD in native artery  [place], DO   4 months ago Type 2 diabetes mellitus with other specified complication, without long-term current use of insulin [facility])   [facility], Dionne Bucy, MD   7 months ago Type 2 diabetes mellitus with other specified complication, without long-term current use of insulin [facility])   [facility], Dionne Bucy, MD   10 months ago Type 2 diabetes mellitus with other specified complication, without long-term current use of insulin [facility])   [facility] [place], Dionne Bucy, MD   1 year ago Acute bilateral low back pain without sciatica   [facility] Gwyneth Sprout, FNP      Future Appointments            In 1 month Bacigalupo, Dionne Bucy, MD [facility], [place]

## 2022-07-28 NOTE — Telephone Encounter (Signed)
Requested Prescriptions  Pending Prescriptions Disp Refills  . metoprolol tartrate (LOPRESSOR) 50 MG tablet [Pharmacy Med Name: METOPROLOL TARTRATE 50 MG TAB] 180 tablet 0    Sig: TAKE 1 TABLET BY MOUTH TWICE A DAY     Cardiovascular:  Beta Blockers Passed - 07/27/2022  5:43 PM      Passed - Last BP in normal range    BP Readings from Last 1 Encounters:  06/12/22 130/79         Passed - Last Heart Rate in normal range    Pulse Readings from Last 1 Encounters:  06/12/22 76         Passed - Valid encounter within last 6 months    Recent Outpatient Visits          1 month ago CAD in native artery   [facility] Myles Gip, DO   4 months ago Type 2 diabetes mellitus with other specified complication, without long-term current use of insulin [facility])   [facility] [place], Dionne Bucy, MD   7 months ago Type 2 diabetes mellitus with other specified complication, without long-term current use of insulin [facility])   [facility] [place], Dionne Bucy, MD   10 months ago Type 2 diabetes mellitus with other specified complication, without long-term current use of insulin [facility])   [facility] [place], Dionne Bucy, MD   1 year ago Acute bilateral low back pain without sciatica   [facility] Gwyneth Sprout, FNP      Future Appointments            In 1 month Bacigalupo, Dionne Bucy, MD [facility], [place]

## 2022-08-09 ENCOUNTER — Other Ambulatory Visit: Payer: Self-pay | Admitting: Family Medicine

## 2022-08-09 DIAGNOSIS — M47816 Spondylosis without myelopathy or radiculopathy, lumbar region: Secondary | ICD-10-CM

## 2022-08-09 NOTE — Telephone Encounter (Signed)
Medication Refill - Medication: HYDROcodone-acetaminophen (NORCO) 5-325 MG tablet  Has the patient contacted their pharmacy? No. (Agent: If no, request that the patient contact the pharmacy for the refill. If patient does not wish to contact the pharmacy document the reason why and proceed with request.)   Preferred Pharmacy (with phone number or street name):  [facility] [place], [place] - [place] [address]  [place] [address]  Phone: [phone] Fax: [phone]  Hours: Not open 24 hours   Has the patient been seen for an appointment in the last year OR does the patient have an upcoming appointment? Yes.    Agent: Please be advised that RX refills may take up to 3 business days. We ask that you follow-up with your pharmacy.

## 2022-08-09 NOTE — Telephone Encounter (Signed)
Requested medication (s) are due for refill today: yes  Requested medication (s) are on the active medication list: yes  Last refill:  07/12/22 #60/0  Future visit scheduled: yes  Notes to clinic:  Unable to refill per protocol, cannot delegate.    Requested Prescriptions  Pending Prescriptions Disp Refills   HYDROcodone-acetaminophen (NORCO) 5-325 MG tablet 60 tablet 0    Sig: Take 1-2 tablets by mouth every 6 (six) hours as needed for moderate pain or severe pain.     Not Delegated - Analgesics:  Opioid Agonist Combinations Failed - 08/09/2022  4:29 PM      Failed - This refill cannot be delegated      Failed - Urine Drug Screen completed in last 360 days      Passed - Valid encounter within last 3 months    Recent Outpatient Visits           1 month ago CAD in native artery   [facility] [place] Myles Gip, DO   5 months ago Type 2 diabetes mellitus with other specified complication, without long-term current use of insulin [facility])   [facility], Dionne Bucy, MD   8 months ago Type 2 diabetes mellitus with other specified complication, without long-term current use of insulin [facility])   [facility], Dionne Bucy, MD   11 months ago Type 2 diabetes mellitus with other specified complication, without long-term current use of insulin [facility])   [facility] [place], Dionne Bucy, MD   1 year ago Acute bilateral low back pain without sciatica   [facility] Gwyneth Sprout, FNP       Future Appointments             In 1 month Bacigalupo, Dionne Bucy, MD [facility], [place]

## 2022-08-10 MED ORDER — HYDROCODONE-ACETAMINOPHEN 5-325 MG PO TABS: 1.0000 | ORAL_TABLET | Freq: Four times a day (QID) | ORAL | 0 refills | Status: DC | PRN

## 2022-08-23 DIAGNOSIS — I2510 Atherosclerotic heart disease of native coronary artery without angina pectoris: Secondary | ICD-10-CM | POA: Diagnosis not present

## 2022-08-23 DIAGNOSIS — I739 Peripheral vascular disease, unspecified: Secondary | ICD-10-CM | POA: Diagnosis not present

## 2022-08-23 DIAGNOSIS — I10 Essential (primary) hypertension: Secondary | ICD-10-CM | POA: Diagnosis not present

## 2022-08-23 DIAGNOSIS — N1832 Chronic kidney disease, stage 3b: Secondary | ICD-10-CM | POA: Diagnosis not present

## 2022-08-23 DIAGNOSIS — E785 Hyperlipidemia, unspecified: Secondary | ICD-10-CM | POA: Diagnosis not present

## 2022-08-23 DIAGNOSIS — Z9889 Other specified postprocedural states: Secondary | ICD-10-CM | POA: Diagnosis not present

## 2022-08-23 DIAGNOSIS — E119 Type 2 diabetes mellitus without complications: Secondary | ICD-10-CM | POA: Diagnosis not present

## 2022-08-30 ENCOUNTER — Other Ambulatory Visit: Payer: Self-pay | Admitting: Family Medicine

## 2022-08-30 DIAGNOSIS — K219 Gastro-esophageal reflux disease without esophagitis: Secondary | ICD-10-CM

## 2022-08-30 DIAGNOSIS — E1165 Type 2 diabetes mellitus with hyperglycemia: Secondary | ICD-10-CM

## 2022-08-30 DIAGNOSIS — I2510 Atherosclerotic heart disease of native coronary artery without angina pectoris: Secondary | ICD-10-CM

## 2022-08-30 DIAGNOSIS — E1159 Type 2 diabetes mellitus with other circulatory complications: Secondary | ICD-10-CM

## 2022-08-30 NOTE — Telephone Encounter (Signed)
Unable to refill per protocol, Rx request is too soon, Last refill 07/03/22 for 90. Should have enough medication until December. Will refuse.  Requested Prescriptions  Pending Prescriptions Disp Refills   hydrochlorothiazide (HYDRODIURIL) 25 MG tablet [Pharmacy Med Name: HYDROCHLOROTHIAZIDE 25 MG TAB] 90 tablet 0    Sig: TAKE 1 TABLET BY MOUTH EVERY DAY     Cardiovascular: Diuretics - Thiazide Passed - 08/30/2022  3:46 PM      Passed - Cr in normal range and within 180 days    Creatinine, Ser  Date Value Ref Range Status  06/12/2022 1.14 0.76 - 1.27 mg/dL Final         Passed - K in normal range and within 180 days    Potassium  Date Value Ref Range Status  06/12/2022 3.7 3.5 - 5.2 mmol/L Final         Passed - Na in normal range and within 180 days    Sodium  Date Value Ref Range Status  06/12/2022 141 134 - 144 mmol/L Final         Passed - Last BP in normal range    BP Readings from Last 1 Encounters:  06/12/22 130/79         Passed - Valid encounter within last 6 months    Recent Outpatient Visits           2 months ago CAD in native artery   [facility] Myles Gip, DO   5 months ago Type 2 diabetes mellitus with other specified complication, without long-term current use of insulin ([place])   [facility] [place], Dionne Bucy, MD   8 months ago Type 2 diabetes mellitus with other specified complication, without long-term current use of insulin [facility])   [place], Dionne Bucy, MD   12 months ago Type 2 diabetes mellitus with other specified complication, without long-term current use of insulin [facility])   [facility] [place], Dionne Bucy, MD   1 year ago Acute bilateral low back pain without sciatica   [facility] Gwyneth Sprout, FNP       Future Appointments             In 3 weeks Bacigalupo, Dionne Bucy, MD [facility], [facility]             amLODipine (NORVASC) 5  MG tablet [Pharmacy Med Name: AMLODIPINE BESYLATE 5 MG TAB] 90 tablet 0    Sig: TAKE 1 TABLET BY MOUTH EVERY DAY     Cardiovascular: Calcium Channel Blockers 2 Passed - 08/30/2022  3:46 PM      Passed - Last BP in normal range    BP Readings from Last 1 Encounters:  06/12/22 130/79         Passed - Last Heart Rate in normal range    Pulse Readings from Last 1 Encounters:  06/12/22 76         Passed - Valid encounter within last 6 months    Recent Outpatient Visits           2 months ago CAD in native artery   [facility] Myles Gip, DO   5 months ago Type 2 diabetes mellitus with other specified complication, without long-term current use of insulin [facility])   [facility] [place], Dionne Bucy, MD   8 months ago Type 2 diabetes mellitus with other specified complication, without long-term current use of insulin ([place])   [facility] [place], Dionne Bucy, MD   12 months ago Type 2 diabetes mellitus with other specified complication, without long-term current use of insulin ([place])   [facility] [place], Dionne Bucy, MD   1 year ago Acute bilateral low back pain without sciatica   [facility] Gwyneth Sprout, FNP       Future Appointments             In 3 weeks Bacigalupo, Dionne Bucy, MD [facility], [facility]             metFORMIN (GLUCOPHAGE) 1000 MG tablet [Pharmacy Med Name: METFORMIN HCL 1,000 MG TABLET] 180 tablet 0    Sig: TAKE 1 TABLET BY MOUTH TWICE A DAY WITH FOOD     Endocrinology:  Diabetes - Biguanides Failed - 08/30/2022  3:46 PM      Failed - B12 Level in normal range and within 720 days    Vitamin B-12  Date Value Ref Range Status  08/09/2020 139 (L) 232 - 1,245 pg/mL Final         Failed - CBC within normal limits and completed in the last 12 months    WBC  Date Value Ref Range Status  10/21/2021 7.2 4.0 - 10.5 K/uL Final   RBC  Date Value Ref Range Status   10/21/2021 4.84 4.22 - 5.81 MIL/uL Final   Hemoglobin  Date Value Ref Range Status  10/21/2021 14.1 13.0 - 17.0 g/dL Final  02/25/2021 14.8 13.0 - 17.7 g/dL Final   HCT  Date Value Ref Range Status  10/21/2021 42.1 39.0 - 52.0 % Final   Hematocrit  Date Value Ref Range Status  02/25/2021 45.5 37.5 - 51.0 % Final   MCHC  Date Value Ref Range Status  10/21/2021 33.5 30.0 - 36.0 g/dL Final   [facility]  Date Value Ref Range Status  10/21/2021 29.1 26.0 - 34.0 pg Final   MCV  Date Value Ref Range Status  10/21/2021 87.0 80.0 - 100.0 fL Final  02/25/2021 89 79 - 97 fL Final   No results found for: "PLTCOUNTKUC", "LABPLAT", "POCPLA" RDW  Date Value Ref Range Status  10/21/2021 15.2 11.5 - 15.5 % Final  02/25/2021 13.9 11.6 - 15.4 % Final         Passed - Cr in normal range and within 360 days    Creatinine, Ser  Date Value Ref Range Status  06/12/2022 1.14 0.76 - 1.27 mg/dL Final         Passed - HBA1C is between 0 and 7.9 and within 180 days    Hemoglobin A1C  Date Value Ref Range Status  07/18/2021 8.1  Final   Hgb A1c MFr Bld  Date Value Ref Range Status  06/12/2022 7.8 (H) 4.8 - 5.6 % Final    Comment:             Prediabetes: 5.7 - 6.4          Diabetes: >6.4          Glycemic control for adults with diabetes: <7.0          Passed - eGFR in normal range and within 360 days    GFR calc Af Amer  Date Value Ref Range Status  08/09/2020 82 >59 mL/min/1.73 Final    Comment:    **In accordance with recommendations from the NKF-ASN Task force,**   [facility] is in the process of updating its eGFR calculation to the   2021 CKD-EPI  creatinine equation that estimates kidney function   without a race variable.    GFR, Estimated  Date Value Ref Range Status  10/21/2021 56 (L) >60 mL/min Final    Comment:    (NOTE) Calculated using the CKD-EPI Creatinine Equation (2021)    eGFR  Date Value Ref Range Status  06/12/2022 68 >59 mL/min/1.73 Final         Passed  - Valid encounter within last 6 months    Recent Outpatient Visits           2 months ago CAD in native artery   [facility] Myles Gip, DO   5 months ago Type 2 diabetes mellitus with other specified complication, without long-term current use of insulin ([place])   [place], Dionne Bucy, MD   8 months ago Type 2 diabetes mellitus with other specified complication, without long-term current use of insulin ([place])   [facility], Dionne Bucy, MD   12 months ago Type 2 diabetes mellitus with other specified complication, without long-term current use of insulin ([place])   [place], Dionne Bucy, MD   1 year ago Acute bilateral low back pain without sciatica   [facility] Gwyneth Sprout, FNP       Future Appointments             In 3 weeks Bacigalupo, Dionne Bucy, MD [facility], [facility]             clopidogrel (PLAVIX) 75 MG tablet [Pharmacy Med Name: CLOPIDOGREL 75 MG TABLET] 90 tablet 0    Sig: TAKE 1 TABLET BY [place]     Hematology: Antiplatelets - clopidogrel Failed - 08/30/2022  3:46 PM      Failed - HCT in normal range and within 180 days    HCT  Date Value Ref Range Status  10/21/2021 42.1 39.0 - 52.0 % Final   Hematocrit  Date Value Ref Range Status  02/25/2021 45.5 37.5 - 51.0 % Final         Failed - HGB in normal range and within 180 days    Hemoglobin  Date Value Ref Range Status  10/21/2021 14.1 13.0 - 17.0 g/dL Final  02/25/2021 14.8 13.0 - 17.7 g/dL Final         Failed - PLT in normal range and within 180 days    Platelets  Date Value Ref Range Status  10/21/2021 243 150 - 400 K/uL Final  02/25/2021 274 150 - 450 x10E3/uL Final         Passed - Cr in normal range and within 360 days    Creatinine, Ser  Date Value Ref Range Status  06/12/2022 1.14 0.76 - 1.27 mg/dL Final         Passed - Valid encounter within last 6 months     Recent Outpatient Visits           2 months ago CAD in native artery   [facility] Myles Gip, DO   5 months ago Type 2 diabetes mellitus with other specified complication, without long-term current use of insulin [facility])   [place], Dionne Bucy, MD   8 months ago Type 2 diabetes mellitus with other specified complication, without long-term current use of insulin [facility])   [facility] Dionne Bucy, MD   12 months ago Type 2 diabetes mellitus with other specified complication, without long-term current use  of insulin [facility])   [facility] [place], Dionne Bucy, MD   1 year ago Acute bilateral low back pain without sciatica   [facility] Gwyneth Sprout, FNP       Future Appointments             In 3 weeks Bacigalupo, Dionne Bucy, MD [facility], [facility]            Signed Prescriptions Disp Refills   pantoprazole (PROTONIX) 40 MG tablet 90 tablet 0    Sig: TAKE 1 TABLET BY MOUTH EVERY DAY AS NEEDED FOR HEARTBURN OR ACID REFLUX     Gastroenterology: Proton Pump Inhibitors Passed - 08/30/2022  3:46 PM      Passed - Valid encounter within last 12 months    Recent Outpatient Visits           2 months ago CAD in native artery   [facility] Myles Gip, DO   5 months ago Type 2 diabetes mellitus with other specified complication, without long-term current use of insulin ([place])   [facility], Dionne Bucy, MD   8 months ago Type 2 diabetes mellitus with other specified complication, without long-term current use of insulin [facility])   [facility], Dionne Bucy, MD   12 months ago Type 2 diabetes mellitus with other specified complication, without long-term current use of insulin [facility])   [facility] [place], Dionne Bucy, MD   1 year ago Acute bilateral low back pain without sciatica   [facility] [place], FNP       Future Appointments             In 3 weeks Bacigalupo, Dionne Bucy, MD [facility], [facility]             lisinopril (ZESTRIL) 20 MG tablet 90 tablet 0    Sig: TAKE 1 TABLET BY MOUTH EVERY DAY     Cardiovascular:  ACE Inhibitors Passed - 08/30/2022  3:46 PM      Passed - Cr in normal range and within 180 days    Creatinine, Ser  Date Value Ref Range Status  06/12/2022 1.14 0.76 - 1.27 mg/dL Final         Passed - K in normal range and within 180 days    Potassium  Date Value Ref Range Status  06/12/2022 3.7 3.5 - 5.2 mmol/L Final         Passed - Patient is not pregnant      Passed - Last BP in normal range    BP Readings from Last 1 Encounters:  06/12/22 130/79         Passed - Valid encounter within last 6 months    Recent Outpatient Visits           2 months ago CAD in native artery   [facility] Myles Gip, DO   5 months ago Type 2 diabetes mellitus with other specified complication, without long-term current use of insulin [facility])   [place], Dionne Bucy, MD   8 months ago Type 2 diabetes mellitus with other specified complication, without long-term current use of insulin [facility])   [place], Dionne Bucy, MD   12 months ago Type 2 diabetes mellitus with other specified complication, without long-term current use of insulin [facility])   [facility], Dionne Bucy, MD   1 year ago Acute  bilateral low back pain without sciatica   [facility] Gwyneth Sprout, FNP       Future Appointments             In 3 weeks Bacigalupo, Dionne Bucy, MD [facility], [place]

## 2022-08-30 NOTE — Telephone Encounter (Signed)
Requested Prescriptions  Pending Prescriptions Disp Refills   pantoprazole (PROTONIX) 40 MG tablet [Pharmacy Med Name: PANTOPRAZOLE SOD DR 40 MG TAB] 90 tablet 0    Sig: TAKE 1 TABLET BY MOUTH EVERY DAY AS NEEDED FOR HEARTBURN OR ACID REFLUX     Gastroenterology: Proton Pump Inhibitors Passed - 08/30/2022  3:46 PM      Passed - Valid encounter within last 12 months    Recent Outpatient Visits           2 months ago CAD in native artery   [facility] Myles Gip, DO   5 months ago Type 2 diabetes mellitus with other specified complication, without long-term current use of insulin ([place])   [facility] [place], Dionne Bucy, MD   8 months ago Type 2 diabetes mellitus with other specified complication, without long-term current use of insulin ([place])   [facility], Dionne Bucy, MD   12 months ago Type 2 diabetes mellitus with other specified complication, without long-term current use of insulin ([place])   [place], Dionne Bucy, MD   1 year ago Acute bilateral low back pain without sciatica   [facility] Tally Joe T, FNP       Future Appointments             In 3 weeks Bacigalupo, Dionne Bucy, MD [facility]             hydrochlorothiazide (HYDRODIURIL) 25 MG tablet [Pharmacy Med Name: HYDROCHLOROTHIAZIDE 25 MG TAB] 90 tablet 0    Sig: TAKE 1 TABLET BY MOUTH EVERY DAY     Cardiovascular: Diuretics - Thiazide Passed - 08/30/2022  3:46 PM      Passed - Cr in normal range and within 180 days    Creatinine, Ser  Date Value Ref Range Status  06/12/2022 1.14 0.76 - 1.27 mg/dL Final         Passed - K in normal range and within 180 days    Potassium  Date Value Ref Range Status  06/12/2022 3.7 3.5 - 5.2 mmol/L Final         Passed - Na in normal range and within 180 days    Sodium  Date Value Ref Range Status  06/12/2022 141 134 - 144 mmol/L Final         Passed  - Last BP in normal range    BP Readings from Last 1 Encounters:  06/12/22 130/79         Passed - Valid encounter within last 6 months    Recent Outpatient Visits           2 months ago CAD in native artery   [facility] Myles Gip, DO   5 months ago Type 2 diabetes mellitus with other specified complication, without long-term current use of insulin [facility])   [facility] [place], Dionne Bucy, MD   8 months ago Type 2 diabetes mellitus with other specified complication, without long-term current use of insulin [facility])   [place], Dionne Bucy, MD   12 months ago Type 2 diabetes mellitus with other specified complication, without long-term current use of insulin [facility])   [facility] [place], Dionne Bucy, MD   1 year ago Acute bilateral low back pain without sciatica   [facility] Gwyneth Sprout, FNP       Future Appointments  In 3 weeks Bacigalupo, Dionne Bucy, MD [facility]             lisinopril (ZESTRIL) 20 MG tablet [Pharmacy Med Name: LISINOPRIL 20 MG TABLET] 90 tablet 0    Sig: TAKE 1 TABLET BY MOUTH EVERY DAY     Cardiovascular:  ACE Inhibitors Passed - 08/30/2022  3:46 PM      Passed - Cr in normal range and within 180 days    Creatinine, Ser  Date Value Ref Range Status  06/12/2022 1.14 0.76 - 1.27 mg/dL Final         Passed - K in normal range and within 180 days    Potassium  Date Value Ref Range Status  06/12/2022 3.7 3.5 - 5.2 mmol/L Final         Passed - Patient is not pregnant      Passed - Last BP in normal range    BP Readings from Last 1 Encounters:  06/12/22 130/79         Passed - Valid encounter within last 6 months    Recent Outpatient Visits           2 months ago CAD in native artery   [facility] Myles Gip, DO   5 months ago Type 2 diabetes mellitus with other specified complication, without  long-term current use of insulin ([place])   [facility] [place], Dionne Bucy, MD   8 months ago Type 2 diabetes mellitus with other specified complication, without long-term current use of insulin [facility])   [facility] [place], Dionne Bucy, MD   12 months ago Type 2 diabetes mellitus with other specified complication, without long-term current use of insulin [facility])   [facility] [place], Dionne Bucy, MD   1 year ago Acute bilateral low back pain without sciatica   [facility] Gwyneth Sprout, FNP       Future Appointments             In 3 weeks Bacigalupo, Dionne Bucy, MD [facility]             amLODipine (NORVASC) 5 MG tablet [Pharmacy Med Name: AMLODIPINE BESYLATE 5 MG TAB] 90 tablet 0    Sig: TAKE 1 TABLET BY MOUTH EVERY DAY     Cardiovascular: Calcium Channel Blockers 2 Passed - 08/30/2022  3:46 PM      Passed - Last BP in normal range    BP Readings from Last 1 Encounters:  06/12/22 130/79         Passed - Last Heart Rate in normal range    Pulse Readings from Last 1 Encounters:  06/12/22 76         Passed - Valid encounter within last 6 months    Recent Outpatient Visits           2 months ago CAD in native artery   [facility] Myles Gip, DO   5 months ago Type 2 diabetes mellitus with other specified complication, without long-term current use of insulin [facility])   [facility] [place], Dionne Bucy, MD   8 months ago Type 2 diabetes mellitus with other specified complication, without long-term current use of insulin [facility])   [facility] [place], Dionne Bucy, MD   12 months ago Type 2 diabetes mellitus with other specified complication, without long-term current use of insulin [facility])   [facility], Dionne Bucy, MD  1 year ago Acute bilateral low back pain without sciatica   [facility] Tally Joe T, FNP        Future Appointments             In 3 weeks Bacigalupo, Dionne Bucy, MD [facility]             metFORMIN (GLUCOPHAGE) 1000 MG tablet [Pharmacy Med Name: METFORMIN HCL 1,000 MG TABLET] 180 tablet 0    Sig: TAKE 1 TABLET BY MOUTH TWICE A DAY WITH FOOD     Endocrinology:  Diabetes - Biguanides Failed - 08/30/2022  3:46 PM      Failed - B12 Level in normal range and within 720 days    Vitamin B-12  Date Value Ref Range Status  08/09/2020 139 (L) 232 - 1,245 pg/mL Final         Failed - CBC within normal limits and completed in the last 12 months    WBC  Date Value Ref Range Status  10/21/2021 7.2 4.0 - 10.5 K/uL Final   RBC  Date Value Ref Range Status  10/21/2021 4.84 4.22 - 5.81 MIL/uL Final   Hemoglobin  Date Value Ref Range Status  10/21/2021 14.1 13.0 - 17.0 g/dL Final  02/25/2021 14.8 13.0 - 17.7 g/dL Final   HCT  Date Value Ref Range Status  10/21/2021 42.1 39.0 - 52.0 % Final   Hematocrit  Date Value Ref Range Status  02/25/2021 45.5 37.5 - 51.0 % Final   MCHC  Date Value Ref Range Status  10/21/2021 33.5 30.0 - 36.0 g/dL Final   [facility]  Date Value Ref Range Status  10/21/2021 29.1 26.0 - 34.0 pg Final   MCV  Date Value Ref Range Status  10/21/2021 87.0 80.0 - 100.0 fL Final  02/25/2021 89 79 - 97 fL Final   No results found for: "PLTCOUNTKUC", "LABPLAT", "POCPLA" RDW  Date Value Ref Range Status  10/21/2021 15.2 11.5 - 15.5 % Final  02/25/2021 13.9 11.6 - 15.4 % Final         Passed - Cr in normal range and within 360 days    Creatinine, Ser  Date Value Ref Range Status  06/12/2022 1.14 0.76 - 1.27 mg/dL Final         Passed - HBA1C is between 0 and 7.9 and within 180 days    Hemoglobin A1C  Date Value Ref Range Status  07/18/2021 8.1  Final   Hgb A1c MFr Bld  Date Value Ref Range Status  06/12/2022 7.8 (H) 4.8 - 5.6 % Final    Comment:             Prediabetes: 5.7 - 6.4          Diabetes: >6.4           Glycemic control for adults with diabetes: <7.0          Passed - eGFR in normal range and within 360 days    GFR calc Af Amer  Date Value Ref Range Status  08/09/2020 82 >59 mL/min/1.73 Final    Comment:    **In accordance with recommendations from the NKF-ASN Task force,**   [facility] is in the process of updating its eGFR calculation to the   2021 CKD-EPI creatinine equation that estimates kidney function   without a race variable.    GFR, Estimated  Date Value Ref Range Status  10/21/2021 56 (L) >60 mL/min Final    Comment:    (  NOTE) Calculated using the CKD-EPI Creatinine Equation (2021)    eGFR  Date Value Ref Range Status  06/12/2022 68 >59 mL/min/1.73 Final         Passed - Valid encounter within last 6 months    Recent Outpatient Visits           2 months ago CAD in native artery   [facility] My[facility]ip, DO   5 months ago Type 2 diabetes mellitus with other specified complication, without long-term current use of insulin ([place])   [facility], Dionne Bucy, MD   8 months ago Type 2 diabetes mellitus with other specified complication, without long-term current use of insulin ([place])   [facility], Dionne Bucy, MD   12 months ago Type 2 diabetes mellitus with other specified complication, without long-term current use of insulin ([place])   [place], Dionne Bucy, MD   1 year ago Acute bilateral low back pain without sciatica   [facility] Gwyneth Sprout, FNP       Future Appointments             In 3 weeks Bacigalupo, Dionne Bucy, MD [facility]             clopidogrel (PLAVIX) 75 MG tablet [Pharmacy Med Name: CLOPIDOGREL 75 MG TABLET] 90 tablet 0    Sig: TAKE 1 TABLET BY [place] DAY     Hematology: Antiplatelets - clopidogrel Failed - 08/30/2022  3:46 PM      Failed - HCT in normal range and within 180 days    HCT  Date Value Ref  Range Status  10/21/2021 42.1 39.0 - 52.0 % Final   Hematocrit  Date Value Ref Range Status  02/25/2021 45.5 37.5 - 51.0 % Final         Failed - HGB in normal range and within 180 days    Hemoglobin  Date Value Ref Range Status  10/21/2021 14.1 13.0 - 17.0 g/dL Final  02/25/2021 14.8 13.0 - 17.7 g/dL Final         Failed - PLT in normal range and within 180 days    Platelets  Date Value Ref Range Status  10/21/2021 243 150 - 400 K/uL Final  02/25/2021 274 150 - 450 x10E3/uL Final         Passed - Cr in normal range and within 360 days    Creatinine, Ser  Date Value Ref Range Status  06/12/2022 1.14 0.76 - 1.27 mg/dL Final         Passed - Valid encounter within last 6 months    Recent Outpatient Visits           2 months ago CAD in native artery   [facility] Myles Gip, DO   5 months ago Type 2 diabetes mellitus with other specified complication, without long-term current use of insulin [facility])   [place], Dionne Bucy, MD   8 months ago Type 2 diabetes mellitus with other specified complication, without long-term current use of insulin [facility])   [facility], Dionne Bucy, MD   12 months ago Type 2 diabetes mellitus with other specified complication, without long-term current use of insulin [facility])   [facility] [place], Dionne Bucy, MD   1 year ago Acute bilateral low back pain without sciatica   [facility] Gwyneth Sprout, FNP  Future Appointments             In 3 weeks Bacigalupo, Dionne Bucy, MD [facility], [place]

## 2022-09-08 ENCOUNTER — Other Ambulatory Visit: Payer: Self-pay | Admitting: Family Medicine

## 2022-09-08 DIAGNOSIS — M47816 Spondylosis without myelopathy or radiculopathy, lumbar region: Secondary | ICD-10-CM

## 2022-09-08 NOTE — Telephone Encounter (Signed)
Medication Refill - Medication: Hydrocodone 5/325  Has the patient contacted their pharmacy? No  (Agent: If no, request that the patient contact the pharmacy for the refill. If patient does not wish to contact the pharmacy document the reason why and proceed with request.) (Agent: If yes, when and what did the pharmacy advise?)  Preferred Pharmacy (with phone number or street name): [facility] [place] Has the patient been seen for an appointment in the last year OR does the patient have an upcoming appointment? Yes.    Agent: Please be advised that RX refills may take up to 3 business days. We ask that you follow-up with your pharmacy.

## 2022-09-11 MED ORDER — HYDROCODONE-ACETAMINOPHEN 5-325 MG PO TABS: 1.0000 | ORAL_TABLET | Freq: Four times a day (QID) | ORAL | 0 refills | Status: DC | PRN

## 2022-09-11 NOTE — Telephone Encounter (Signed)
Requested medication (s) are due for refill today: yes  Requested medication (s) are on the active medication list: yes    Last refill: 08/10/22  360 0 refills  Future visit scheduled yes 09/18/22 .  Notes to clinic:Not delegated, please review. Thank you.  Requested Prescriptions  Pending Prescriptions Disp Refills   HYDROcodone-acetaminophen (NORCO) 5-325 MG tablet 60 tablet 0    Sig: Take 1-2 tablets by mouth every 6 (six) hours as needed for moderate pain or severe pain.     Not Delegated - Analgesics:  Opioid Agonist Combinations Failed - 09/08/2022  5:09 PM      Failed - This refill cannot be delegated      Failed - Urine Drug Screen completed in last 360 days      Passed - Valid encounter within last 3 months    Recent Outpatient Visits           3 months ago CAD in native artery   [facility] Caro Laroche, DO   6 months ago Type 2 diabetes mellitus with other specified complication, without long-term current use of insulin [facility])   [facility], Marzella Schlein, MD   9 months ago Type 2 diabetes mellitus with other specified complication, without long-term current use of insulin [facility])   [facility] [place], Marzella Schlein, MD   1 year ago Type 2 diabetes mellitus with other specified complication, without long-term current use of insulin [facility])   [facility] [place], Marzella Schlein, MD   1 year ago Acute bilateral low back pain without sciatica   [facility] Jacky Kindle, FNP       Future Appointments             In 1 week Bacigalupo, Marzella Schlein, MD [facility], [facility]

## 2022-09-12 NOTE — Progress Notes (Signed)
I,Joseline E Rosas,acting as a scribe for Lavon Paganini, MD.,have documented all relevant documentation on the behalf of Lavon Paganini, MD,as directed by  Lavon Paganini, MD while in the presence of Lavon Paganini, MD.   Established patient visit   Patient: [name]   DOB: [birth year]   71 y.o. Male  MRN: [id number] Visit Date: 09/18/2022  Today's healthcare provider: Lavon Paganini, MD   Chief Complaint  Patient presents with   Follow-Up DM and HTN   Subjective    HPI  Diabetes Mellitus Type II, Follow-up  Lab Results  Component Value Date   HGBA1C 7.1 (A) 09/18/2022   HGBA1C 7.8 (H) 06/12/2022   HGBA1C 7.2 (A) 03/10/2022   Wt Readings from Last 3 Encounters:  09/18/22 192 lb 9.6 oz (87.4 kg)  06/12/22 198 lb 3.2 oz (89.9 kg)  03/10/22 195 lb 12.8 oz (88.8 kg)   Last seen for diabetes 3 months ago.  Management since then includes no changes. He reports excellent compliance with treatment. He is not having side effects.  Symptoms: No fatigue No foot ulcerations  No appetite changes No nausea  No paresthesia of the feet  No polydipsia  No polyuria No visual disturbances   No vomiting     Home blood sugar records: fasting range: 138, not checking everyday.  Episodes of hypoglycemia? No    Current insulin regiment: none Most Recent Eye Exam: UTD Current exercise: walking 2 miles twice a week , but keep very active Current diet habits: general   Pertinent Labs: Lab Results  Component Value Date   CHOL 145 12/08/2021   HDL 27 (L) 12/08/2021   LDLCALC 92 12/08/2021   TRIG 147 12/08/2021   CHOLHDL 5.4 (H) 12/08/2021   Lab Results  Component Value Date   NA 141 06/12/2022   K 3.7 06/12/2022   CREATININE 1.14 06/12/2022   EGFR 68 06/12/2022   LABMICR 5.5 03/10/2022     --------------------------------------------------------------------------------------------------- Lipid/Cholesterol, Follow-up  Last lipid panel Other pertinent  labs  Lab Results  Component Value Date   CHOL 145 12/08/2021   HDL 27 (L) 12/08/2021   LDLCALC 92 12/08/2021   TRIG 147 12/08/2021   CHOLHDL 5.4 (H) 12/08/2021   Lab Results  Component Value Date   ALT 21 12/08/2021   AST 23 12/08/2021   PLT 243 10/21/2021     He was last seen for this 3 months ago.  Management since that visit includes no changes.  He reports excellent compliance with treatment. He is not having side effects.   The 10-year ASCVD risk score (Arnett DK, et al., 2019) is: 31.3%  --------------------------------------------------------------------------------------------------- Hypertension, follow-up  BP Readings from Last 3 Encounters:  09/18/22 100/66  06/12/22 130/79  03/10/22 111/75   Wt Readings from Last 3 Encounters:  09/18/22 192 lb 9.6 oz (87.4 kg)  06/12/22 198 lb 3.2 oz (89.9 kg)  03/10/22 195 lb 12.8 oz (88.8 kg)     He was last seen for hypertension 3 months ago.  BP at that visit was 130/79. Management since that visit includes no changes.  He reports excellent compliance with treatment. He is not having side effects.  He does not smoke.  Use of agents associated with hypertension: none.   Outside blood pressures are 120's/77 when checked.  Pertinent labs Lab Results  Component Value Date   CHOL 145 12/08/2021   HDL 27 (L) 12/08/2021   LDLCALC 92 12/08/2021   TRIG 147 12/08/2021   CHOLHDL 5.4 (  H) 12/08/2021   Lab Results  Component Value Date   NA 141 06/12/2022   K 3.7 06/12/2022   CREATININE 1.14 06/12/2022   EGFR 68 06/12/2022   GLUCOSE 127 (H) 06/12/2022     The 10-year ASCVD risk score (Arnett DK, et al., 2019) is: 31.3%  --------------------------------------------------------------------------------------------------- GERD - worse at night - tried tums, alka setzer and mustard. Happened a few times over last several nights  Medications: Outpatient Medications Prior to Visit  Medication Sig   acetaminophen  (TYLENOL) 500 MG tablet Take 1,000 mg by mouth every 6 (six) hours as needed for mild pain or moderate pain.   acetaminophen (TYLENOL) 500 MG tablet Take by mouth.   amLODipine (NORVASC) 5 MG tablet TAKE 1 TABLET BY MOUTH EVERY DAY   aspirin 81 MG tablet Take 81 mg by mouth daily.    atorvastatin (LIPITOR) 80 MG tablet TAKE ONE (1) TABLET EACH DAY   blood glucose meter kit and supplies KIT Dispense based on patient and insurance preference. Check sugars once daily   Cholecalciferol (VITAMIN D3 PO) Take 1 tablet by mouth daily.   clopidogrel (PLAVIX) 75 MG tablet TAKE 1 TABLET BY MOUTH EVERY DAY   Dulaglutide (TRULICITY) 4.5 MW/1.0UV SOPN Inject 4.5 mg as directed once a week.   DULoxetine (CYMBALTA) 20 MG capsule Take 20 mg by mouth daily.   ezetimibe (ZETIA) 10 MG tablet Take 1 tablet (10 mg total) by mouth daily.   fenofibrate (TRICOR) 145 MG tablet TAKE 1 TABLET BY MOUTH EVERY DAY   furosemide (LASIX) 20 MG tablet Take 20 mg by mouth daily.   glucose blood (ONETOUCH VERIO) test strip TEST BLOOD GLUCOSE EVERY DAY   hydrochlorothiazide (HYDRODIURIL) 25 MG tablet TAKE 1 TABLET BY MOUTH EVERY DAY   HYDROcodone-acetaminophen (NORCO) 5-325 MG tablet Take 1-2 tablets by mouth every 6 (six) hours as needed for moderate pain or severe pain.   JARDIANCE 25 MG TABS tablet TAKE 1 TABLET(25 MG) BY MOUTH DAILY   Lidocaine 4 % PTCH Apply 1 patch topically daily as needed (pain).   Liniments (BEN GAY EX) Apply 1 application topically daily as needed (pain).   lisinopril (ZESTRIL) 20 MG tablet TAKE 1 TABLET BY MOUTH EVERY DAY   metFORMIN (GLUCOPHAGE) 1000 MG tablet TAKE 1 TABLET BY MOUTH TWICE A DAY WITH FOOD   metoprolol tartrate (LOPRESSOR) 50 MG tablet TAKE 1 TABLET BY MOUTH TWICE A DAY   tamsulosin (FLOMAX) 0.4 MG CAPS capsule Take 0.4 mg by mouth daily.   [DISCONTINUED] pantoprazole (PROTONIX) 40 MG tablet TAKE 1 TABLET BY MOUTH EVERY DAY AS NEEDED FOR HEARTBURN OR ACID REFLUX   No  facility-administered medications prior to visit.    Review of Systems  Constitutional:  Negative for appetite change, chills, fatigue and fever.  Eyes:  Negative for visual disturbance.  Respiratory:  Negative for cough, chest tightness, shortness of breath and wheezing.   Cardiovascular:  Negative for chest pain and leg swelling.  Gastrointestinal:  Negative for abdominal pain, diarrhea, nausea and vomiting.  Neurological:  Negative for dizziness, light-headedness and headaches.       Objective    BP 100/66 (BP Location: Right Arm, Patient Position: Sitting, Cuff Size: Normal)   Pulse 78   Temp 97.7 F (36.5 C) (Oral)   Resp 16   Ht 6' (1.829 m)   Wt 192 lb 9.6 oz (87.4 kg)   BMI 26.12 kg/m  BP Readings from Last 3 Encounters:  09/18/22 100/66  06/12/22 130/79  03/10/22 111/75   Wt Readings from Last 3 Encounters:  09/18/22 192 lb 9.6 oz (87.4 kg)  06/12/22 198 lb 3.2 oz (89.9 kg)  03/10/22 195 lb 12.8 oz (88.8 kg)      Physical Exam Vitals reviewed.  Constitutional:      General: He is not in acute distress.    Appearance: Normal appearance. He is not diaphoretic.  HENT:     Head: Normocephalic and atraumatic.  Eyes:     General: No scleral icterus.    Conjunctiva/sclera: Conjunctivae normal.  Cardiovascular:     Rate and Rhythm: Normal rate and regular rhythm.     Pulses: Normal pulses.     Heart sounds: Normal heart sounds. No murmur heard. Pulmonary:     Effort: Pulmonary effort is normal. No respiratory distress.     Breath sounds: Normal breath sounds. No wheezing or rhonchi.  Musculoskeletal:     Cervical back: Neck supple.     Right lower leg: No edema.     Left lower leg: No edema.  Lymphadenopathy:     Cervical: No cervical adenopathy.  Skin:    General: Skin is warm and dry.     Findings: No rash.  Neurological:     Mental Status: He is alert and oriented to person, place, and time. Mental status is at baseline.  Psychiatric:        Mood  and Affect: Mood normal.        Behavior: Behavior normal.       Results for orders placed or performed in visit on 09/18/22  POCT glycosylated hemoglobin (Hb A1C)  Result Value Ref Range   Hemoglobin A1C 7.1 (A) 4.0 - 5.6 %   Est. average glucose Bld gHb Est-mCnc 157     Assessment & Plan     Problem List Items Addressed This Visit       Cardiovascular and Mediastinum   Hypertension associated with diabetes ([place])    Well controlled Continue current medications Reviewed recent metabolic panel        Digestive   Gastro-esophageal reflux disease without esophagitis    Chronic and uncontrolled with recent exacerbation Increase PPI to BID dosing x14 days nad then back to baseline 40 mg daily dose      Relevant Medications   pantoprazole (PROTONIX) 40 MG tablet     Endocrine   Hyperlipidemia associated with type 2 diabetes mellitus (HCC)    Previously well controlled Continue statin Repeat FLP and CMP at next visit Goal LDL < 70      T2DM (type 2 diabetes mellitus) ([place]) - Primary    Well controlled with A1c <7.5 goal Continue current medications UTD on vaccines, eye exam, foot exam completed today On ACEi/ARB On Statin Discussed diet and exercise F/u in 3 months       Relevant Orders   POCT glycosylated hemoglobin (Hb A1C) (Completed)   Other Visit Diagnoses     Need for immunization against influenza       Relevant Orders   Flu Vaccine QUAD High Dose(Fluad) (Completed)   Gastroesophageal reflux disease without esophagitis       Relevant Medications   pantoprazole (PROTONIX) 40 MG tablet        Return in about 3 months (around 12/19/2022) for chronic disease f/u.      I, Lavon Paganini, MD, have reviewed all documentation for this visit. The documentation on 09/18/22 for the exam, diagnosis, procedures, and orders are all accurate and complete.  Tenesha Garza, Dionne Bucy, MD, MPH [facility]

## 2022-09-18 ENCOUNTER — Encounter: Payer: Self-pay | Admitting: Family Medicine

## 2022-09-18 ENCOUNTER — Ambulatory Visit (INDEPENDENT_AMBULATORY_CARE_PROVIDER_SITE_OTHER): Payer: No Typology Code available for payment source | Admitting: Family Medicine

## 2022-09-18 VITALS — BP 100/66 | HR 78 | Temp 97.7°F | Resp 16 | Ht 72.0 in | Wt 192.6 lb

## 2022-09-18 DIAGNOSIS — E1169 Type 2 diabetes mellitus with other specified complication: Secondary | ICD-10-CM

## 2022-09-18 DIAGNOSIS — K219 Gastro-esophageal reflux disease without esophagitis: Secondary | ICD-10-CM | POA: Diagnosis not present

## 2022-09-18 DIAGNOSIS — E785 Hyperlipidemia, unspecified: Secondary | ICD-10-CM | POA: Diagnosis not present

## 2022-09-18 DIAGNOSIS — I152 Hypertension secondary to endocrine disorders: Secondary | ICD-10-CM

## 2022-09-18 DIAGNOSIS — Z23 Encounter for immunization: Secondary | ICD-10-CM | POA: Diagnosis not present

## 2022-09-18 DIAGNOSIS — E1159 Type 2 diabetes mellitus with other circulatory complications: Secondary | ICD-10-CM | POA: Diagnosis not present

## 2022-09-18 LAB — POCT GLYCOSYLATED HEMOGLOBIN (HGB A1C)
Est. average glucose Bld gHb Est-mCnc: 157
Hemoglobin A1C: 7.1 % — AB (ref 4.0–5.6)

## 2022-09-18 MED ORDER — PANTOPRAZOLE SODIUM 40 MG PO TBEC: DELAYED_RELEASE_TABLET | ORAL | 3 refills | Status: DC

## 2022-09-18 NOTE — Assessment & Plan Note (Signed)
Well controlled with A1c <7.5 goal Continue current medications UTD on vaccines, eye exam, foot exam completed today On ACEi/ARB On Statin Discussed diet and exercise F/u in 3 months

## 2022-09-18 NOTE — Assessment & Plan Note (Signed)
Well controlled Continue current medications Reviewed recent metabolic panel 

## 2022-09-18 NOTE — Assessment & Plan Note (Signed)
Previously well controlled Continue statin Repeat FLP and CMP at next visit Goal LDL < 70  

## 2022-09-18 NOTE — Assessment & Plan Note (Signed)
Chronic and uncontrolled with recent exacerbation Increase PPI to BID dosing x14 days nad then back to baseline 40 mg daily dose

## 2022-09-21 ENCOUNTER — Ambulatory Visit: Payer: No Typology Code available for payment source | Admitting: Family Medicine

## 2022-09-27 ENCOUNTER — Other Ambulatory Visit: Payer: Self-pay | Admitting: Family Medicine

## 2022-09-27 DIAGNOSIS — E1169 Type 2 diabetes mellitus with other specified complication: Secondary | ICD-10-CM

## 2022-09-27 DIAGNOSIS — E1165 Type 2 diabetes mellitus with hyperglycemia: Secondary | ICD-10-CM

## 2022-09-27 DIAGNOSIS — I2510 Atherosclerotic heart disease of native coronary artery without angina pectoris: Secondary | ICD-10-CM

## 2022-09-27 DIAGNOSIS — E1159 Type 2 diabetes mellitus with other circulatory complications: Secondary | ICD-10-CM

## 2022-09-27 NOTE — Telephone Encounter (Signed)
Requested Prescriptions  Pending Prescriptions Disp Refills   hydrochlorothiazide (HYDRODIURIL) 25 MG tablet [Pharmacy Med Name: HYDROCHLOROTHIAZIDE 25 MG TAB] 90 tablet 0    Sig: TAKE 1 TABLET BY MOUTH EVERY DAY     Cardiovascular: Diuretics - Thiazide Passed - 09/27/2022 12:11 PM      Passed - Cr in normal range and within 180 days    Creatinine, Ser  Date Value Ref Range Status  06/12/2022 1.14 0.76 - 1.27 mg/dL Final         Passed - K in normal range and within 180 days    Potassium  Date Value Ref Range Status  06/12/2022 3.7 3.5 - 5.2 mmol/L Final         Passed - Na in normal range and within 180 days    Sodium  Date Value Ref Range Status  06/12/2022 141 134 - 144 mmol/L Final         Passed - Last BP in normal range    BP Readings from Last 1 Encounters:  09/18/22 100/66         Passed - Valid encounter within last 6 months    Recent Outpatient Visits           1 week ago Type 2 diabetes mellitus with other specified complication, without long-term current use of insulin ([place])   [facility] [place], Dionne Bucy, MD   3 months ago CAD in native artery   [facility] Myles Gip, DO   6 months ago Type 2 diabetes mellitus with other specified complication, without long-term current use of insulin ([place])   [facility], Dionne Bucy, MD   9 months ago Type 2 diabetes mellitus with other specified complication, without long-term current use of insulin ([place])   [place], Dionne Bucy, MD   1 year ago Type 2 diabetes mellitus with other specified complication, without long-term current use of insulin ([place])   [facility], Dionne Bucy, MD       Future Appointments             In 2 months Bacigalupo, Dionne Bucy, MD [facility], [facility]             metFORMIN (GLUCOPHAGE) 1000 MG tablet [Pharmacy Med Name: METFORMIN HCL 1,000 MG TABLET] 180 tablet 0     Sig: TAKE 1 TABLET BY MOUTH TWICE A DAY WITH FOOD     Endocrinology:  Diabetes - Biguanides Failed - 09/27/2022 12:11 PM      Failed - B12 Level in normal range and within 720 days    Vitamin B-12  Date Value Ref Range Status  08/09/2020 139 (L) 232 - 1,245 pg/mL Final         Failed - CBC within normal limits and completed in the last 12 months    WBC  Date Value Ref Range Status  10/21/2021 7.2 4.0 - 10.5 K/uL Final   RBC  Date Value Ref Range Status  10/21/2021 4.84 4.22 - 5.81 MIL/uL Final   Hemoglobin  Date Value Ref Range Status  10/21/2021 14.1 13.0 - 17.0 g/dL Final  02/25/2021 14.8 13.0 - 17.7 g/dL Final   HCT  Date Value Ref Range Status  10/21/2021 42.1 39.0 - 52.0 % Final   Hematocrit  Date Value Ref Range Status  02/25/2021 45.5 37.5 - 51.0 % Final   MCHC  Date Value Ref Range Status  10/21/2021 33.5 30.0 - 36.0 g/dL  Final   MCH  Date Value Ref Range Status  10/21/2021 29.1 26.0 - 34.0 pg Final   MCV  Date Value Ref Range Status  10/21/2021 87.0 80.0 - 100.0 fL Final  02/25/2021 89 79 - 97 fL Final   No results found for: "PLTCOUNTKUC", "LABPLAT", "POCPLA" RDW  Date Value Ref Range Status  10/21/2021 15.2 11.5 - 15.5 % Final  02/25/2021 13.9 11.6 - 15.4 % Final         Passed - Cr in normal range and within 360 days    Creatinine, Ser  Date Value Ref Range Status  06/12/2022 1.14 0.76 - 1.27 mg/dL Final         Passed - HBA1C is between 0 and 7.9 and within 180 days    Hemoglobin A1C  Date Value Ref Range Status  09/18/2022 7.1 (A) 4.0 - 5.6 % Final  07/18/2021 8.1  Final   Hgb A1c MFr Bld  Date Value Ref Range Status  06/12/2022 7.8 (H) 4.8 - 5.6 % Final    Comment:             Prediabetes: 5.7 - 6.4          Diabetes: >6.4          Glycemic control for adults with diabetes: <7.0          Passed - eGFR in normal range and within 360 days    GFR calc Af Amer  Date Value Ref Range Status  08/09/2020 82 >59 mL/min/1.73 Final     Comment:    **In accordance with recommendations from the NKF-ASN Task force,**   [facility] is in the process of updating its eGFR calculation to the   2021 CKD-EPI creatinine equation that estimates kidney function   without a race variable.    GFR, Estimated  Date Value Ref Range Status  10/21/2021 56 (L) >60 mL/min Final    Comment:    (NOTE) Calculated using the CKD-EPI Creatinine Equation (2021)    eGFR  Date Value Ref Range Status  06/12/2022 68 >59 mL/min/1.73 Final         Passed - Valid encounter within last 6 months    Recent Outpatient Visits           1 week ago Type 2 diabetes mellitus with other specified complication, without long-term current use of insulin [facility])   [facility] [place], Dionne Bucy, MD   3 months ago CAD in native artery   [facility] Myles Gip, DO   6 months ago Type 2 diabetes mellitus with other specified complication, without long-term current use of insulin [facility])   [facility], Dionne Bucy, MD   9 months ago Type 2 diabetes mellitus with other specified complication, without long-term current use of insulin [facility])   [facility], Dionne Bucy, MD   1 year ago Type 2 diabetes mellitus with other specified complication, without long-term current use of insulin [facility])   [place], Dionne Bucy, MD       Future Appointments             In 2 months Bacigalupo, Dionne Bucy, MD [facility]             clopidogrel (PLAVIX) 75 MG tablet [Pharmacy Med Name: CLOPIDOGREL 75 MG TABLET] 90 tablet 0    Sig: TAKE 1 TABLET BY [place]     Hematology: Antiplatelets - clopidogrel  Failed - 09/27/2022 12:11 PM      Failed - HCT in normal range and within 180 days    HCT  Date Value Ref Range Status  10/21/2021 42.1 39.0 - 52.0 % Final   Hematocrit  Date Value Ref Range Status  02/25/2021 45.5 37.5 - 51.0 % Final          Failed - HGB in normal range and within 180 days    Hemoglobin  Date Value Ref Range Status  10/21/2021 14.1 13.0 - 17.0 g/dL Final  02/25/2021 14.8 13.0 - 17.7 g/dL Final         Failed - PLT in normal range and within 180 days    Platelets  Date Value Ref Range Status  10/21/2021 243 150 - 400 K/uL Final  02/25/2021 274 150 - 450 x10E3/uL Final         Passed - Cr in normal range and within 360 days    Creatinine, Ser  Date Value Ref Range Status  06/12/2022 1.14 0.76 - 1.27 mg/dL Final         Passed - Valid encounter within last 6 months    Recent Outpatient Visits           1 week ago Type 2 diabetes mellitus with other specified complication, without long-term current use of insulin ([place])   [facility] [place], Dionne Bucy, MD   3 months ago CAD in native artery   [facility] Myles [facility]ip, DO   6 months ago Type 2 diabetes mellitus with other specified complication, without long-term current use of insulin [facility])   [facility] [place], Dionne Bucy, MD   9 months ago Type 2 diabetes mellitus with other specified complication, without long-term current use of insulin ([place])   [place], Dionne Bucy, MD   1 year ago Type 2 diabetes mellitus with other specified complication, without long-term current use of insulin ([place])   [place], Dionne Bucy, MD       Future Appointments             In 2 months Bacigalupo, Dionne Bucy, MD [facility]             amLODipine (NORVASC) 5 MG tablet [Pharmacy Med Name: AMLODIPINE BESYLATE 5 MG TAB] 90 tablet 0    Sig: TAKE 1 TABLET BY MOUTH EVERY DAY     Cardiovascular: Calcium Channel Blockers 2 Passed - 09/27/2022 12:11 PM      Passed - Last BP in normal range    BP Readings from Last 1 Encounters:  09/18/22 100/66         Passed - Last Heart Rate in normal range    Pulse Readings from Last 1 Encounters:   09/18/22 78         Passed - Valid encounter within last 6 months    Recent Outpatient Visits           1 week ago Type 2 diabetes mellitus with other specified complication, without long-term current use of insulin [facility])   [facility] [place], Dionne Bucy, MD   3 months ago CAD in native artery   [facility] Myles Gip, DO   6 months ago Type 2 diabetes mellitus with other specified complication, without long-term current use of insulin [facility])   [place], Dionne Bucy, MD   9 months ago Type 2 diabetes mellitus with other specified  complication, without long-term current use of insulin [facility])   [facility] [place], Dionne Bucy, MD   1 year ago Type 2 diabetes mellitus with other specified complication, without long-term current use of insulin [facility])   [place], Dionne Bucy, MD       Future Appointments             In 2 months Bacigalupo, Dionne Bucy, MD [facility], [place]

## 2022-10-10 ENCOUNTER — Other Ambulatory Visit: Payer: Self-pay | Admitting: Family Medicine

## 2022-10-10 DIAGNOSIS — M47816 Spondylosis without myelopathy or radiculopathy, lumbar region: Secondary | ICD-10-CM

## 2022-10-10 DIAGNOSIS — I152 Hypertension secondary to endocrine disorders: Secondary | ICD-10-CM

## 2022-10-10 NOTE — Telephone Encounter (Signed)
Medication Refill - Medication: HYDROcodone-acetaminophen (NORCO) 5-325 MG tablet   Has the patient contacted their pharmacy? No. Preferred Pharmacy (with phone number or street name): [id number] - [place], [place] - [address] AT [facility] [address]  Has the patient been seen for an appointment in the last year OR does the patient have an upcoming appointment? Yes.    Agent: Please be advised that RX refills may take up to 3 business days. We ask that you follow-up with your pharmacy.

## 2022-10-10 NOTE — Telephone Encounter (Signed)
Requested medication (s) are due for refill today - yes  Requested medication (s) are on the active medication list -yes  Future visit scheduled -yes  Last refill: 09/11/22 #60  Notes to clinic: non delegated Rx  Requested Prescriptions  Pending Prescriptions Disp Refills   HYDROcodone-acetaminophen (NORCO) 5-325 MG tablet 60 tablet 0    Sig: Take 1-2 tablets by mouth every 6 (six) hours as needed for moderate pain or severe pain.     Not Delegated - Analgesics:  Opioid Agonist Combinations Failed - 10/10/2022  1:22 PM      Failed - This refill cannot be delegated      Failed - Urine Drug Screen completed in last 360 days      Passed - Valid encounter within last 3 months    Recent Outpatient Visits           3 weeks ago Type 2 diabetes mellitus with other specified complication, without long-term current use of insulin [facility])   [facility] [place], Dionne Bucy, MD   4 months ago CAD in native artery   [facility] Myles Gip, DO   7 months ago Type 2 diabetes mellitus with other specified complication, without long-term current use of insulin [facility])   [facility], Dion[facility] Bucy, MD   10 months ago Type 2 diabetes mellitus with other specified complication, without long-term current use of insulin [facility])   [facility], Dionne Bucy, MD   1 year ago Type 2 diabetes mellitus with other specified complication, without long-term current use of insulin [facility])   [facility], Dionne Bucy, MD       Future Appointments             In 2 months Bacigalupo, Dionne Bucy, MD [facility]               Requested Prescriptions  Pending Prescriptions Disp Refills   HYDROcodone-acetaminophen (NORCO) 5-325 MG tablet 60 tablet 0    Sig: Take 1-2 tablets by mouth every 6 (six) hours as needed for moderate pain or severe pain.     Not Delegated - Analgesics:  Opioid  Agonist Combinations Failed - 10/10/2022  1:22 PM      Failed - This refill cannot be delegated      Failed - Urine Drug Screen completed in last 360 days      Passed - Valid encounter within last 3 months    Recent Outpatient Visits           3 weeks ago Type 2 diabetes mellitus with other specified complication, without long-term current use of insulin [facility])   [facility] [place], Dionne Bucy, MD   4 months ago CAD in native artery   [facility] Myles Gip, DO   7 months ago Type 2 diabetes mellitus with other specified complication, without long-term current use of insulin [facility])   [facility], Dionne Bucy, MD   10 months ago Type 2 diabetes mellitus with other specified complication, without long-term current use of insulin [facility])   [facility], Dionne Bucy, MD   1 year ago Type 2 diabetes mellitus with other specified complication, without long-term current use of insulin [facility])   [facility] Bacigal[place], Dionne Bucy, MD       Future Appointments             In 2 months Bacigalupo, Dionne Bucy, MD  [facility], [facility]

## 2022-10-11 ENCOUNTER — Telehealth: Payer: Self-pay | Admitting: Family Medicine

## 2022-10-11 DIAGNOSIS — M47816 Spondylosis without myelopathy or radiculopathy, lumbar region: Secondary | ICD-10-CM

## 2022-10-11 NOTE — Telephone Encounter (Signed)
Copied from CRM [id number]. Topic: General - Other >> Oct 11, 2022  1:21 PM Everette C wrote: Reason for CRM: The patient's daughter has called to request an update on the status of their previous request for a refill of HYDROcodone-acetaminophen (NORCO) 5-325 MG tablet [[id number]]  Please contact the patient's daughter when possible to further discuss

## 2022-10-12 MED ORDER — HYDROCODONE-ACETAMINOPHEN 5-325 MG PO TABS: 1.0000 | ORAL_TABLET | Freq: Four times a day (QID) | ORAL | 0 refills | Status: DC | PRN

## 2022-11-08 ENCOUNTER — Telehealth: Payer: Self-pay | Admitting: Family Medicine

## 2022-11-08 DIAGNOSIS — I2510 Atherosclerotic heart disease of native coronary artery without angina pectoris: Secondary | ICD-10-CM

## 2022-11-08 DIAGNOSIS — E1169 Type 2 diabetes mellitus with other specified complication: Secondary | ICD-10-CM

## 2022-11-09 ENCOUNTER — Other Ambulatory Visit: Payer: Self-pay | Admitting: Family Medicine

## 2022-11-09 DIAGNOSIS — M47816 Spondylosis without myelopathy or radiculopathy, lumbar region: Secondary | ICD-10-CM

## 2022-11-09 MED ORDER — HYDROCODONE-ACETAMINOPHEN 5-325 MG PO TABS: 1.0000 | ORAL_TABLET | Freq: Four times a day (QID) | ORAL | 0 refills | Status: DC | PRN

## 2022-11-09 NOTE — Telephone Encounter (Signed)
Medication Refill - Medication: HYDROcodone-acetaminophen (NORCO) 5-325 MG tablet [[id number]]    Has the patient contacted their pharmacy? Yes.   (Agent: If no, request that the patient contact the pharmacy for the refill. If patient does not wish to contact the pharmacy document the reason why and proceed with request.) (Agent: If yes, when and what did the pharmacy advise?)  Preferred Pharmacy (with phone number or street name): [facility] [place], [place]  Has the patient been seen for an appointment in the last year OR does the patient have an upcoming appointment? Yes.    Agent: Please be advised that RX refills may take up to 3 business days. We ask that you follow-up with your pharmacy.

## 2022-11-09 NOTE — Telephone Encounter (Signed)
Requested medication (s) are due for refill today:   Provider to review  Requested medication (s) are on the active medication list:   Yes  Future visit scheduled:   Yes   Last ordered: 10/12/2022 #60, 0 refills  Non delegated refill    Requested Prescriptions  Pending Prescriptions Disp Refills   HYDROcodone-acetaminophen (NORCO) 5-325 MG tablet 60 tablet 0    Sig: Take 1-2 tablets by mouth every 6 (six) hours as needed for moderate pain or severe pain.     Not Delegated - Analgesics:  Opioid Agonist Combinations Failed - 11/09/2022 12:55 PM      Failed - This refill cannot be delegated      Failed - Urine Drug Screen completed in last 360 days      Passed - Valid encounter within last 3 months    Recent Outpatient Visits           1 month ago Type 2 diabetes mellitus with other specified complication, without long-term current use of insulin [facility])   [facility] [place], Dionne Bucy, MD   5 months ago CAD in native artery   [facility] Myles Gip, DO   8 months ago Type 2 diabetes mellitus with other specified complication, without long-term current use of insulin [facility])   [facility], Dionne Bucy, MD   11 months ago Type 2 diabetes mellitus with other specified complication, without long-term current use of insulin [facility])   [facility] [place], Dionne Bucy, MD   1 year ago Type 2 diabetes mellitus with other specified complication, without long-term current use of insulin [facility])   [place], Dionne Bucy, MD       Future Appointments             In 1 month Bacigalupo, Dionne Bucy, MD [facility], [facility]

## 2022-11-21 NOTE — Telephone Encounter (Addendum)
Pt daughter called in stated medication Dulaglutide (TRULICITY) 4.5 YQ/6.5HQ SOPN is backordered everywhere. She mentioned that she has called all pharmacies in [place]. Pt is due for his medication tomorrow and is asking if Dr.B has any samples at the office or is seeking clinical advice on what to do.   Please advise.

## 2022-11-21 NOTE — Telephone Encounter (Signed)
We could do a month of the 3mg  dose and see if the 4.5 is back in stock after that.  Ok to send in the 3mg  weekly dose instead. Doubt insurance would cover 3 and 1.5 to make the 4.5 mg dose, but we could also try that if they wanted.

## 2022-11-22 NOTE — Telephone Encounter (Signed)
Patient reports they have medication now.

## 2022-11-29 ENCOUNTER — Other Ambulatory Visit: Payer: Self-pay | Admitting: Family Medicine

## 2022-11-29 DIAGNOSIS — E1169 Type 2 diabetes mellitus with other specified complication: Secondary | ICD-10-CM

## 2022-12-06 DIAGNOSIS — E119 Type 2 diabetes mellitus without complications: Secondary | ICD-10-CM | POA: Diagnosis not present

## 2022-12-06 DIAGNOSIS — H35033 Hypertensive retinopathy, bilateral: Secondary | ICD-10-CM | POA: Diagnosis not present

## 2022-12-06 LAB — HM DIABETES EYE EXAM

## 2022-12-08 ENCOUNTER — Other Ambulatory Visit: Payer: Self-pay | Admitting: Family Medicine

## 2022-12-08 DIAGNOSIS — M47816 Spondylosis without myelopathy or radiculopathy, lumbar region: Secondary | ICD-10-CM

## 2022-12-08 NOTE — Telephone Encounter (Signed)
Medication Refill - Medication: HYDROcodone-acetaminophen (NORCO) 5-325 MG tablet   Has the patient contacted their pharmacy? No. No, more refills.   (Agent: If no, request that the patient contact the pharmacy for the refill. If patient does not wish to contact the pharmacy document the reason why and proceed with request.)   Preferred Pharmacy (with phone number or street name):  [facility] [place], [place] - [address]  [place] [address]  Phone: [phone] Fax: [phone]  Hours: Not open 24 hours   Has the patient been seen for an appointment in the last year OR does the patient have an upcoming appointment? Yes.    Agent: Please be advised that RX refills may take up to 3 business days. We ask that you follow-up with your pharmacy.

## 2022-12-11 MED ORDER — HYDROCODONE-ACETAMINOPHEN 5-325 MG PO TABS: 1.0000 | ORAL_TABLET | Freq: Four times a day (QID) | ORAL | 0 refills | Status: DC | PRN

## 2022-12-11 NOTE — Telephone Encounter (Signed)
Requested medications are due for refill today.  Unsure  Requested medications are on the active medications list.  no  Last refill. 11/09/2022 #60 0 rf  Future visit scheduled.   yes  Notes to clinic.  Refill not delegated.    Requested Prescriptions  Pending Prescriptions Disp Refills   HYDROcodone-acetaminophen (NORCO) 5-325 MG tablet 60 tablet 0    Sig: Take 1-2 tablets by mouth every 6 (six) hours as needed for moderate pain or severe pain.     Not Delegated - Analgesics:  Opioid Agonist Combinations Failed - 12/08/2022  3:34 PM      Failed - This refill cannot be delegated      Failed - Urine Drug Screen completed in last 360 days      Passed - Valid encounter within last 3 months    Recent Outpatient Visits           2 months ago Type 2 diabetes mellitus with other specified complication, without long-term current use of insulin ([place])   [place], Dionne Bucy, MD   6 months ago CAD in native artery   [facility] Rory Percy M, DO   9 months ago Type 2 diabetes mellitus with other specified complication, without long-term current use of insulin [facility])   [place], Dionne Bucy, MD   1 year ago Type 2 diabetes mellitus with other specified complication, without long-term current use of insulin [facility])   [place], Dionne Bucy, MD   1 year ago Type 2 diabetes mellitus with other specified complication, without long-term current use of insulin [facility])   [place] Bacigalupo, Dionne Bucy, MD       Future Appointments             In 1 week Bacigalupo, Dionne Bucy, MD [facility]

## 2022-12-11 NOTE — Telephone Encounter (Signed)
Requested medications are due for refill today.  unsure  Requested medications are on the active medications list.  no  Last refill. unsure  Future visit scheduled.   yes  Notes to clinic.  Refill not delegated. Med not on med list.    Requested Prescriptions  Pending Prescriptions Disp Refills   HYDROcodone-acetaminophen (NORCO) 5-325 MG tablet 60 tablet 0    Sig: Take 1-2 tablets by mouth every 6 (six) hours as needed for moderate pain or severe pain.     Not Delegated - Analgesics:  Opioid Agonist Combinations Failed - 12/08/2022  3:34 PM      Failed - This refill cannot be delegated      Failed - Urine Drug Screen completed in last 360 days      Passed - Valid encounter within last 3 months    Recent Outpatient Visits           2 months ago Type 2 diabetes mellitus with other specified complication, without long-term current use of insulin ([place])   [place], Dionne Bucy, MD   6 months ago CAD in native artery   [facility] Rory Percy M, DO   9 months ago Type 2 diabetes mellitus with other specified complication, without long-term current use of insulin [facility])   [place], Dionne Bucy, MD   1 year ago Type 2 diabetes mellitus with other specified complication, without long-term current use of insulin [facility])   [place], Dionne Bucy, MD   1 year ago Type 2 diabetes mellitus with other specified complication, without long-term current use of insulin [facility])   [place] Bacigalupo, Dionne Bucy, MD       Future Appointments             In 1 week Bacigalupo, Dionne Bucy, MD [facility], [facility]

## 2022-12-12 ENCOUNTER — Telehealth: Payer: Self-pay | Admitting: Family Medicine

## 2022-12-12 DIAGNOSIS — I2510 Atherosclerotic heart disease of native coronary artery without angina pectoris: Secondary | ICD-10-CM

## 2022-12-12 DIAGNOSIS — E1169 Type 2 diabetes mellitus with other specified complication: Secondary | ICD-10-CM

## 2022-12-12 DIAGNOSIS — E1159 Type 2 diabetes mellitus with other circulatory complications: Secondary | ICD-10-CM

## 2022-12-12 DIAGNOSIS — E1165 Type 2 diabetes mellitus with hyperglycemia: Secondary | ICD-10-CM

## 2022-12-12 NOTE — Telephone Encounter (Signed)
Patient's daughter called in stating they are switching pharmacies and would like all of the patient's prescriptions except the pain medication to go to [facility] in [place]. Address is  [address], [place], [place] [address]. Phone number:  [phone]

## 2022-12-12 NOTE — Telephone Encounter (Signed)
[facility] faxed refill request for the following medications:  furosemide (LASIX) 20 MG tablet   hydrochlorothiazide (HYDRODIURIL) 25 MG tablet   lisinopril (ZESTRIL) 20 MG tablet   Note from pharmacy:  Pt is new to this pharmacy and they are not able to transfer the above medications from [facility].  Please send new orders if appropriate and approved.  Please advise.

## 2022-12-13 MED ORDER — FUROSEMIDE 20 MG PO TABS: 20.0000 mg | ORAL_TABLET | Freq: Every day | ORAL | 1 refills | Status: DC

## 2022-12-13 MED ORDER — HYDROCHLOROTHIAZIDE 25 MG PO TABS: 25.0000 mg | ORAL_TABLET | Freq: Every day | ORAL | 1 refills | Status: DC

## 2022-12-13 MED ORDER — LISINOPRIL 20 MG PO TABS: 20.0000 mg | ORAL_TABLET | Freq: Every day | ORAL | 1 refills | Status: DC

## 2022-12-14 MED ORDER — ATORVASTATIN CALCIUM 80 MG PO TABS: 80.0000 mg | ORAL_TABLET | Freq: Every day | ORAL | 1 refills | Status: DC

## 2022-12-14 MED ORDER — METOPROLOL TARTRATE 50 MG PO TABS: 50.0000 mg | ORAL_TABLET | Freq: Two times a day (BID) | ORAL | 1 refills | Status: DC

## 2022-12-14 MED ORDER — FENOFIBRATE 145 MG PO TABS: 145.0000 mg | ORAL_TABLET | Freq: Every day | ORAL | 1 refills | Status: DC

## 2022-12-14 MED ORDER — EMPAGLIFLOZIN 25 MG PO TABS: 25.0000 mg | ORAL_TABLET | Freq: Every day | ORAL | 1 refills | Status: DC

## 2022-12-14 MED ORDER — METFORMIN HCL 1000 MG PO TABS: 1000.0000 mg | ORAL_TABLET | Freq: Two times a day (BID) | ORAL | 1 refills | Status: DC

## 2022-12-14 MED ORDER — CLOPIDOGREL BISULFATE 75 MG PO TABS: 75.0000 mg | ORAL_TABLET | Freq: Every day | ORAL | 1 refills | Status: DC

## 2022-12-14 MED ORDER — TRULICITY 4.5 MG/0.5ML ~~LOC~~ SOAJ: 4.5000 mg | SUBCUTANEOUS | 3 refills | Status: DC

## 2022-12-14 MED ORDER — AMLODIPINE BESYLATE 5 MG PO TABS: 5.0000 mg | ORAL_TABLET | Freq: Every day | ORAL | 1 refills | Status: DC

## 2022-12-14 MED ORDER — EZETIMIBE 10 MG PO TABS: 10.0000 mg | ORAL_TABLET | Freq: Every day | ORAL | 1 refills | Status: DC

## 2022-12-14 NOTE — Addendum Note (Signed)
Addended by: Shawna Orleans on: 12/14/2022 08:27 AM   Modules accepted: Orders

## 2022-12-14 NOTE — Telephone Encounter (Signed)
All medication sent to [facility] as requested by patient's daughter.

## 2022-12-15 ENCOUNTER — Encounter: Payer: Self-pay | Admitting: Family Medicine

## 2022-12-18 NOTE — Progress Notes (Unsigned)
I,Kerstie Agent S Gurtaj Ruz,acting as a scribe for Lavon Paganini, MD.,have documented all relevant documentation on the behalf of Lavon Paganini, MD,as directed by  Lavon Paganini, MD while in the presence of Lavon Paganini, MD.     Established patient visit   Patient: [name]   DOB: 14-Nov-1950   72 y.o. Male  MRN: [id number] Visit Date: 12/19/2022  Today's healthcare provider: Lavon Paganini, MD   No chief complaint on file.  Subjective    HPI  Diabetes Mellitus Type II, Follow-up  Lab Results  Component Value Date   HGBA1C 7.1 (A) 09/18/2022   HGBA1C 7.8 (H) 06/12/2022   HGBA1C 7.2 (A) 03/10/2022   Wt Readings from Last 3 Encounters:  09/18/22 192 lb 9.6 oz (87.4 kg)  06/12/22 198 lb 3.2 oz (89.9 kg)  03/10/22 195 lb 12.8 oz (88.8 kg)   Last seen for diabetes 3 months ago.  Management since then includes no changes. He reports {excellent/good/fair/poor:19665} compliance with treatment. He {is/is not:21021397} having side effects. {document side effects if present:1}  Home blood sugar records: {diabetes glucometry results:16657}  Episodes of hypoglycemia? {Yes/No:20286} {enter symptoms and frequency of symptoms if yes:1}   Current insulin regiment: none Most Recent Eye Exam: UTD  Pertinent Labs: Lab Results  Component Value Date   CHOL 145 12/08/2021   HDL 27 (L) 12/08/2021   LDLCALC 92 12/08/2021   TRIG 147 12/08/2021   CHOLHDL 5.4 (H) 12/08/2021   Lab Results  Component Value Date   NA 141 06/12/2022   K 3.7 06/12/2022   CREATININE 1.14 06/12/2022   EGFR 68 06/12/2022   LABMICR 5.5 03/10/2022   MICRALBCREAT 4 03/10/2022     ---------------------------------------------------------------------------------------------------   Medications: Outpatient Medications Prior to Visit  Medication Sig   acetaminophen (TYLENOL) 500 MG tablet Take 1,000 mg by mouth every 6 (six) hours as needed for mild pain or moderate pain.   acetaminophen  (TYLENOL) 500 MG tablet Take by mouth.   amLODipine (NORVASC) 5 MG tablet Take 1 tablet (5 mg total) by mouth daily.   aspirin 81 MG tablet Take 81 mg by mouth daily.    atorvastatin (LIPITOR) 80 MG tablet Take 1 tablet (80 mg total) by mouth daily.   blood glucose meter kit and supplies KIT Dispense based on patient and insurance preference. Check sugars once daily   Cholecalciferol (VITAMIN D3 PO) Take 1 tablet by mouth daily.   clopidogrel (PLAVIX) 75 MG tablet Take 1 tablet (75 mg total) by mouth daily.   Dulaglutide (TRULICITY) 4.5 0000000 SOPN Inject 4.5 mg as directed once a week.   DULoxetine (CYMBALTA) 20 MG capsule Take 20 mg by mouth daily.   empagliflozin (JARDIANCE) 25 MG TABS tablet Take 1 tablet (25 mg total) by mouth daily.   ezetimibe (ZETIA) 10 MG tablet Take 1 tablet (10 mg total) by mouth daily.   fenofibrate (TRICOR) 145 MG tablet Take 1 tablet (145 mg total) by mouth daily.   furosemide (LASIX) 20 MG tablet Take 1 tablet (20 mg total) by mouth daily. Take 20 mg by mouth daily.   glucose blood (ONETOUCH VERIO) test strip TEST BLOOD GLUCOSE EVERY DAY   hydrochlorothiazide (HYDRODIURIL) 25 MG tablet Take 1 tablet (25 mg total) by mouth daily.   HYDROcodone-acetaminophen (NORCO) 5-325 MG tablet Take 1-2 tablets by mouth every 6 (six) hours as needed for moderate pain or severe pain.   Lidocaine 4 % PTCH Apply 1 patch topically daily as needed (pain).   Liniments (BEN  GAY EX) Apply 1 application topically daily as needed (pain).   lisinopril (ZESTRIL) 20 MG tablet Take 1 tablet (20 mg total) by mouth daily.   metFORMIN (GLUCOPHAGE) 1000 MG tablet Take 1 tablet (1,000 mg total) by mouth 2 (two) times daily with a meal.   metoprolol tartrate (LOPRESSOR) 50 MG tablet Take 1 tablet (50 mg total) by mouth 2 (two) times daily.   pantoprazole (PROTONIX) 40 MG tablet Take 1 tablet (40 mg total) by mouth 2 (two) times daily for 14 days, THEN 1 tablet (40 mg total) daily.   tamsulosin  (FLOMAX) 0.4 MG CAPS capsule Take 0.4 mg by mouth daily.   No facility-administered medications prior to visit.    Review of Systems  Constitutional:  Negative for appetite change and fatigue.  Eyes:  Negative for visual disturbance.  Respiratory:  Negative for chest tightness and shortness of breath.   Cardiovascular:  Negative for chest pain and leg swelling.  Gastrointestinal:  Negative for abdominal pain, nausea and vomiting.    {Labs  Heme  Chem  Endocrine  Serology  Results Review (optional):23779}   Objective    There were no vitals taken for this visit. {Show previous vital signs (optional):23777}  Physical Exam  ***  No results found for any visits on 12/19/22.  Assessment & Plan     ***  No follow-ups on file.      {provider attestation***:1}   Lavon Paganini, MD  [facility] [phone] (phone) [phone] (fax)  [place]

## 2022-12-19 ENCOUNTER — Encounter: Payer: Self-pay | Admitting: Family Medicine

## 2022-12-19 ENCOUNTER — Ambulatory Visit (INDEPENDENT_AMBULATORY_CARE_PROVIDER_SITE_OTHER): Payer: No Typology Code available for payment source | Admitting: Family Medicine

## 2022-12-19 VITALS — BP 116/75 | HR 76 | Temp 97.6°F | Resp 16 | Wt 197.6 lb

## 2022-12-19 DIAGNOSIS — I2510 Atherosclerotic heart disease of native coronary artery without angina pectoris: Secondary | ICD-10-CM

## 2022-12-19 DIAGNOSIS — K219 Gastro-esophageal reflux disease without esophagitis: Secondary | ICD-10-CM | POA: Diagnosis not present

## 2022-12-19 DIAGNOSIS — N182 Chronic kidney disease, stage 2 (mild): Secondary | ICD-10-CM | POA: Diagnosis not present

## 2022-12-19 DIAGNOSIS — E785 Hyperlipidemia, unspecified: Secondary | ICD-10-CM | POA: Diagnosis not present

## 2022-12-19 DIAGNOSIS — E1165 Type 2 diabetes mellitus with hyperglycemia: Secondary | ICD-10-CM | POA: Diagnosis not present

## 2022-12-19 DIAGNOSIS — E1169 Type 2 diabetes mellitus with other specified complication: Secondary | ICD-10-CM | POA: Diagnosis not present

## 2022-12-19 DIAGNOSIS — E1159 Type 2 diabetes mellitus with other circulatory complications: Secondary | ICD-10-CM | POA: Diagnosis not present

## 2022-12-19 DIAGNOSIS — M47816 Spondylosis without myelopathy or radiculopathy, lumbar region: Secondary | ICD-10-CM | POA: Diagnosis not present

## 2022-12-19 DIAGNOSIS — Z79899 Other long term (current) drug therapy: Secondary | ICD-10-CM | POA: Diagnosis not present

## 2022-12-19 DIAGNOSIS — I152 Hypertension secondary to endocrine disorders: Secondary | ICD-10-CM

## 2022-12-19 MED ORDER — AMLODIPINE BESYLATE 5 MG PO TABS: 5.0000 mg | ORAL_TABLET | Freq: Every day | ORAL | 1 refills | Status: DC

## 2022-12-19 MED ORDER — FENOFIBRATE 145 MG PO TABS: 145.0000 mg | ORAL_TABLET | Freq: Every day | ORAL | 1 refills | Status: DC

## 2022-12-19 MED ORDER — PANTOPRAZOLE SODIUM 40 MG PO TBEC: 40.0000 mg | DELAYED_RELEASE_TABLET | Freq: Every day | ORAL | 3 refills | Status: DC

## 2022-12-19 MED ORDER — METFORMIN HCL 1000 MG PO TABS: 1000.0000 mg | ORAL_TABLET | Freq: Two times a day (BID) | ORAL | 1 refills | Status: DC

## 2022-12-19 MED ORDER — DULOXETINE HCL 20 MG PO CPEP: 20.0000 mg | ORAL_CAPSULE | Freq: Every day | ORAL | 1 refills | Status: DC

## 2022-12-19 MED ORDER — METOPROLOL TARTRATE 50 MG PO TABS: 50.0000 mg | ORAL_TABLET | Freq: Two times a day (BID) | ORAL | 1 refills | Status: DC

## 2022-12-19 MED ORDER — HYDROCHLOROTHIAZIDE 25 MG PO TABS: 25.0000 mg | ORAL_TABLET | Freq: Every day | ORAL | 1 refills | Status: DC

## 2022-12-19 MED ORDER — EMPAGLIFLOZIN 25 MG PO TABS: 25.0000 mg | ORAL_TABLET | Freq: Every day | ORAL | 1 refills | Status: DC

## 2022-12-19 MED ORDER — LISINOPRIL 20 MG PO TABS: 20.0000 mg | ORAL_TABLET | Freq: Every day | ORAL | 1 refills | Status: DC

## 2022-12-19 MED ORDER — FUROSEMIDE 20 MG PO TABS: 20.0000 mg | ORAL_TABLET | Freq: Every day | ORAL | 1 refills | Status: DC

## 2022-12-19 MED ORDER — TRULICITY 4.5 MG/0.5ML ~~LOC~~ SOAJ: 4.5000 mg | SUBCUTANEOUS | 3 refills | Status: DC

## 2022-12-19 MED ORDER — ATORVASTATIN CALCIUM 80 MG PO TABS: 80.0000 mg | ORAL_TABLET | Freq: Every day | ORAL | 1 refills | Status: DC

## 2022-12-19 MED ORDER — CLOPIDOGREL BISULFATE 75 MG PO TABS: 75.0000 mg | ORAL_TABLET | Freq: Every day | ORAL | 1 refills | Status: DC

## 2022-12-19 MED ORDER — TAMSULOSIN HCL 0.4 MG PO CAPS: 0.4000 mg | ORAL_CAPSULE | Freq: Every day | ORAL | 1 refills | Status: DC

## 2022-12-19 MED ORDER — EZETIMIBE 10 MG PO TABS: 10.0000 mg | ORAL_TABLET | Freq: Every day | ORAL | 1 refills | Status: DC

## 2022-12-19 NOTE — Assessment & Plan Note (Signed)
Chronic and stable Recheck metabolic panel Avoid nephrotoxic meds

## 2022-12-19 NOTE — Assessment & Plan Note (Signed)
Slightly elevated with last A1c - recheck today Continue current medications UTD on vaccines, eye exam, foot exam On ACEi/ARB On Statin Discussed diet and exercise F/u in 3 months

## 2022-12-19 NOTE — Assessment & Plan Note (Signed)
Chronic and stable Continue cymbalta and Norco at current dose

## 2022-12-19 NOTE — Assessment & Plan Note (Signed)
Continue DAPT

## 2022-12-19 NOTE — Assessment & Plan Note (Signed)
Well controlled Continue current medications Recheck metabolic panel F/u in 3-6 months

## 2022-12-19 NOTE — Assessment & Plan Note (Signed)
Previously well controlled Continue statin Repeat FLP and CMP Goal LDL < 70 

## 2022-12-20 LAB — COMPREHENSIVE METABOLIC PANEL
ALT: 22 IU/L (ref 0–44)
AST: 18 IU/L (ref 0–40)
Albumin/Globulin Ratio: 2 (ref 1.2–2.2)
Albumin: 4.6 g/dL (ref 3.8–4.8)
Alkaline Phosphatase: 36 IU/L — ABNORMAL LOW (ref 44–121)
BUN/Creatinine Ratio: 17 (ref 10–24)
BUN: 19 mg/dL (ref 8–27)
Bilirubin Total: 0.5 mg/dL (ref 0.0–1.2)
CO2: 24 mmol/L (ref 20–29)
Calcium: 10.1 mg/dL (ref 8.6–10.2)
Chloride: 99 mmol/L (ref 96–106)
Creatinine, Ser: 1.15 mg/dL (ref 0.76–1.27)
Globulin, Total: 2.3 g/dL (ref 1.5–4.5)
Glucose: 140 mg/dL — ABNORMAL HIGH (ref 70–99)
Potassium: 3.9 mmol/L (ref 3.5–5.2)
Sodium: 140 mmol/L (ref 134–144)
Total Protein: 6.9 g/dL (ref 6.0–8.5)
eGFR: 68 mL/min/{1.73_m2} (ref >59)

## 2022-12-20 LAB — CBC
Hematocrit: 43.1 % (ref 37.5–51.0)
Hemoglobin: 14.3 g/dL (ref 13.0–17.7)
MCH: 28.7 pg (ref 26.6–33.0)
MCHC: 33.2 g/dL (ref 31.5–35.7)
MCV: 87 fL (ref 79–97)
Platelets: 254 10*3/uL (ref 150–450)
RBC: 4.98 x10E6/uL (ref 4.14–5.80)
RDW: 13.5 % (ref 11.6–15.4)
WBC: 7.2 10*3/uL (ref 3.4–10.8)

## 2022-12-20 LAB — LIPID PANEL
Chol/HDL Ratio: 5 ratio (ref 0.0–5.0)
Cholesterol, Total: 146 mg/dL (ref 100–199)
HDL: 29 mg/dL — ABNORMAL LOW (ref >39)
LDL Chol Calc (NIH): 91 mg/dL (ref 0–99)
Triglycerides: 146 mg/dL (ref 0–149)
VLDL Cholesterol Cal: 26 mg/dL (ref 5–40)

## 2022-12-20 LAB — HEMOGLOBIN A1C
Est. average glucose Bld gHb Est-mCnc: 180 mg/dL
Hgb A1c MFr Bld: 7.9 % — ABNORMAL HIGH (ref 4.8–5.6)

## 2022-12-22 ENCOUNTER — Telehealth: Payer: Self-pay

## 2022-12-22 NOTE — Telephone Encounter (Signed)
Pt called for lab results - spoke with daughter Renato Shin. Shared provider's note.  Virginia Crews, MD 12/21/2022  1:38 PM EST     Normal/stable labs, except A1c is high at 7.9.  Make sure to take meds as Rx'd and watch sweets and carbs intake.  May ned to consider insulin if not improving at next visit,

## 2023-01-09 ENCOUNTER — Other Ambulatory Visit: Payer: Self-pay | Admitting: Family Medicine

## 2023-01-09 DIAGNOSIS — M47816 Spondylosis without myelopathy or radiculopathy, lumbar region: Secondary | ICD-10-CM

## 2023-01-09 NOTE — Telephone Encounter (Signed)
Medication Refill - Medication: HYDROcodone-acetaminophen (NORCO) 5-325 MG tablet    Pt is out of medication.  Has the patient contacted their pharmacy? Yes.   Referred pt to PCP.   Preferred Pharmacy (with phone number or street name):  [facility], [address] Phone: [phone]  Fax: [phone]     Has the patient been seen for an appointment in the last year OR does the patient have an upcoming appointment? Yes.    Agent: Please be advised that RX refills may take up to 3 business days. We ask that you follow-up with your pharmacy.

## 2023-01-10 MED ORDER — HYDROCODONE-ACETAMINOPHEN 5-325 MG PO TABS: 1.0000 | ORAL_TABLET | Freq: Four times a day (QID) | ORAL | 0 refills | Status: AC | PRN

## 2023-01-10 NOTE — Telephone Encounter (Signed)
Requested medication (s) are due for refill today: yes  Requested medication (s) are on the active medication list: yes  Last refill:  12/11/22  Future visit scheduled: yes  Notes to clinic:  Unable to refill per protocol, cannot delegate.      Requested Prescriptions  Pending Prescriptions Disp Refills   HYDROcodone-acetaminophen (NORCO) 5-325 MG tablet 60 tablet 0    Sig: Take 1-2 tablets by mouth every 6 (six) hours as needed for moderate pain or severe pain.     Not Delegated - Analgesics:  Opioid Agonist Combinations Failed - 01/09/2023  4:54 PM      Failed - This refill cannot be delegated      Failed - Urine Drug Screen completed in last 360 days      Passed - Valid encounter within last 3 months    Recent Outpatient Visits           3 weeks ago Type 2 diabetes mellitus with hyperglycemia, without long-term current use of insulin [facility])   [place], Dionne Bucy, MD   3 months ago Type 2 diabetes mellitus with other specified complication, without long-term current use of insulin [facility])   [place], Dionne Bucy, MD   7 months ago CAD in native artery   [facility] Rory Percy M, DO   10 months ago Type 2 diabetes mellitus with other specified complication, without long-term current use of insulin [facility])   [place], Dionne Bucy, MD   1 year ago Type 2 diabetes mellitus with other specified complication, without long-term current use of insulin [facility])   [place], Dionne Bucy, MD       Future Appointments             In 2 months [place], Dionne Bucy, MD [facility]

## 2023-02-05 ENCOUNTER — Ambulatory Visit (INDEPENDENT_AMBULATORY_CARE_PROVIDER_SITE_OTHER): Payer: No Typology Code available for payment source

## 2023-02-05 VITALS — Ht 72.0 in | Wt 192.4 lb

## 2023-02-05 DIAGNOSIS — Z0000 Encounter for general adult medical examination without abnormal findings: Secondary | ICD-10-CM | POA: Diagnosis not present

## 2023-02-05 DIAGNOSIS — Z122 Encounter for screening for malignant neoplasm of respiratory organs: Secondary | ICD-10-CM

## 2023-02-05 NOTE — Patient Instructions (Signed)
[name] , Thank you for taking time to come for your Medicare Wellness Visit. I appreciate your ongoing commitment to your health goals. Please review the following plan we discussed and let me know if I can assist you in the future.   These are the goals we discussed:  Goals       DIET - EAT MORE FRUITS AND VEGETABLES      Exercise 3x per week (30 min per time)      Recommend to exercise for 3 days a week for at least 30 minutes at a time.       Weight (lb) < 185 lb (83.9 kg) (pt-stated)      Recommend to cut back on sugar intake and sweets in diet to help aid with weight loss.         This is a list of the screening recommended for you and due dates:  Health Maintenance  Topic Date Due   COVID-19 Vaccine (4 - 2023-24 season) 06/23/2022   DTaP/Tdap/Td vaccine (2 - Td or Tdap) 09/27/2022   Yearly kidney health urinalysis for diabetes  03/11/2023   Flu Shot  05/24/2023   Hemoglobin A1C  06/19/2023   Complete foot exam   09/19/2023   Eye exam for diabetics  12/07/2023   Yearly kidney function blood test for diabetes  12/20/2023   Medicare Annual Wellness Visit  02/05/2024   Cologuard (Stool DNA test)  04/15/2024   Pneumonia Vaccine  Completed   Hepatitis C Screening: USPSTF Recommendation to screen - Ages 80-79 yo.  Completed   Zoster (Shingles) Vaccine  Completed   HPV Vaccine  Aged Out   Colon Cancer Screening  Discontinued    Advanced directives: yes  Conditions/risks identified: none  Next appointment: Follow up in one year for your annual wellness visit. 02/06/2024 @ 1:30pm telephone  Preventive Care 65 Years and Older, Male  Preventive care refers to lifestyle choices and visits with your health care provider that can promote health and wellness. What does preventive care include? A yearly physical exam. This is also called an annual well check. Dental exams once or twice a year. Routine eye exams. Ask your health care provider how often you should have your eyes  checked. Personal lifestyle choices, including: Daily care of your teeth and gums. Regular physical activity. Eating a healthy diet. Avoiding tobacco and drug use. Limiting alcohol use. Practicing safe sex. Taking low doses of aspirin every day. Taking vitamin and mineral supplements as recommended by your health care provider. What happens during an annual well check? The services and screenings done by your health care provider during your annual well check will depend on your age, overall health, lifestyle risk factors, and family history of disease. Counseling  Your health care provider may ask you questions about your: Alcohol use. Tobacco use. Drug use. Emotional well-being. Home and relationship well-being. Sexual activity. Eating habits. History of falls. Memory and ability to understand (cognition). Work and work Astronomer. Screening  You may have the following tests or measurements: Height, weight, and BMI. Blood pressure. Lipid and cholesterol levels. These may be checked every 5 years, or more frequently if you are over 30 years old. Skin check. Lung cancer screening. You may have this screening every year starting at age 85 if you have a 30-pack-year history of smoking and currently smoke or have quit within the past 15 years. Fecal occult blood test (FOBT) of the stool. You may have this test every year starting  at age 93. Flexible sigmoidoscopy or colonoscopy. You may have a sigmoidoscopy every 5 years or a colonoscopy every 10 years starting at age 40. Prostate cancer screening. Recommendations will vary depending on your family history and other risks. Hepatitis C blood test. Hepatitis B blood test. Sexually transmitted disease (STD) testing. Diabetes screening. This is done by checking your blood sugar (glucose) after you have not eaten for a while (fasting). You may have this done every 1-3 years. Abdominal aortic aneurysm (AAA) screening. You may need this  if you are a current or former smoker. Osteoporosis. You may be screened starting at age 92 if you are at high risk. Talk with your health care provider about your test results, treatment options, and if necessary, the need for more tests. Vaccines  Your health care provider may recommend certain vaccines, such as: Influenza vaccine. This is recommended every year. Tetanus, diphtheria, and acellular pertussis (Tdap, Td) vaccine. You may need a Td booster every 10 years. Zoster vaccine. You may need this after age 18. Pneumococcal 13-valent conjugate (PCV13) vaccine. One dose is recommended after age 60. Pneumococcal polysaccharide (PPSV23) vaccine. One dose is recommended after age 36. Talk to your health care provider about which screenings and vaccines you need and how often you need them. This information is not intended to replace advice given to you by your health care provider. Make sure you discuss any questions you have with your health care provider. Document Released: 11/05/2015 Document Revised: 06/28/2016 Document Reviewed: 08/10/2015 Elsevier Interactive Patient Education  2017 [employer].  Fall Prevention in the Home Falls can cause injuries. They can happen to people of all ages. There are many things you can do to make your home safe and to help prevent falls. What can I do on the outside of my home? Regularly fix the edges of walkways and driveways and fix any cracks. Remove anything that might make you trip as you walk through a door, such as a raised step or threshold. Trim any bushes or trees on the path to your home. Use bright outdoor lighting. Clear any walking paths of anything that might make someone trip, such as rocks or tools. Regularly check to see if handrails are loose or broken. Make sure that both sides of any steps have handrails. Any raised decks and porches should have guardrails on the edges. Have any leaves, snow, or ice cleared regularly. Use sand  or salt on walking paths during winter. Clean up any spills in your garage right away. This includes oil or grease spills. What can I do in the bathroom? Use night lights. Install grab bars by the toilet and in the tub and shower. Do not use towel bars as grab bars. Use non-skid mats or decals in the tub or shower. If you need to sit down in the shower, use a plastic, non-slip stool. Keep the floor dry. Clean up any water that spills on the floor as soon as it happens. Remove soap buildup in the tub or shower regularly. Attach bath mats securely with double-sided non-slip rug tape. Do not have throw rugs and other things on the floor that can make you trip. What can I do in the bedroom? Use night lights. Make sure that you have a light by your bed that is easy to reach. Do not use any sheets or blankets that are too big for your bed. They should not hang down onto the floor. Have a firm chair that has side arms. You  can use this for support while you get dressed. Do not have throw rugs and other things on the floor that can make you trip. What can I do in the kitchen? Clean up any spills right away. Avoid walking on wet floors. Keep items that you use a lot in easy-to-reach places. If you need to reach something above you, use a strong step stool that has a grab bar. Keep electrical cords out of the way. Do not use floor polish or wax that makes floors slippery. If you must use wax, use non-skid floor wax. Do not have throw rugs and other things on the floor that can make you trip. What can I do with my stairs? Do not leave any items on the stairs. Make sure that there are handrails on both sides of the stairs and use them. Fix handrails that are broken or loose. Make sure that handrails are as long as the stairways. Check any carpeting to make sure that it is firmly attached to the stairs. Fix any carpet that is loose or worn. Avoid having throw rugs at the top or bottom of the stairs.  If you do have throw rugs, attach them to the floor with carpet tape. Make sure that you have a light switch at the top of the stairs and the bottom of the stairs. If you do not have them, ask someone to add them for you. What else can I do to help prevent falls? Wear shoes that: Do not have high heels. Have rubber bottoms. Are comfortable and fit you well. Are closed at the toe. Do not wear sandals. If you use a stepladder: Make sure that it is fully opened. Do not climb a closed stepladder. Make sure that both sides of the stepladder are locked into place. Ask someone to hold it for you, if possible. Clearly mark and make sure that you can see: Any grab bars or handrails. First and last steps. Where the edge of each step is. Use tools that help you move around (mobility aids) if they are needed. These include: Canes. Walkers. Scooters. Crutches. Turn on the lights when you go into a dark area. Replace any light bulbs as soon as they burn out. Set up your furniture so you have a clear path. Avoid moving your furniture around. If any of your floors are uneven, fix them. If there are any pets around you, be aware of where they are. Review your medicines with your doctor. Some medicines can make you feel dizzy. This can increase your chance of falling. Ask your doctor what other things that you can do to help prevent falls. This information is not intended to replace advice given to you by your health care provider. Make sure you discuss any questions you have with your health care provider. Document Released: 08/05/2009 Document Revised: 03/16/2016 Document Reviewed: 11/13/2014 Elsevier Interactive Patient Education  2017 [employer].

## 2023-02-05 NOTE — Progress Notes (Signed)
I connected with  [name] on 02/05/23 by a audio enabled telemedicine application and verified that I am speaking with the correct person using two identifiers.  Patient Location: Home  Provider Location: Office/Clinic  I discussed the limitations of evaluation and management by telemedicine. The patient expressed understanding and agreed to proceed.  Subjective:   [name] is a 72 y.o. male who presents for Medicare Annual/Subsequent preventive examination.  Review of Systems    Cardiac Risk Factors include: advanced age (>26men, >81 women);diabetes mellitus;dyslipidemia;hypertension;male gender;sedentary lifestyle    Objective:    Today's Vitals   02/05/23 1336  Weight: 192 lb 6.4 oz (87.3 kg)  Height: 6' (1.829 m)   Body mass index is 26.09 kg/m.     02/05/2023    1:46 PM 01/31/2022    2:16 PM 11/01/2021   12:59 PM 10/20/2021    3:39 PM 01/25/2021    2:44 PM 01/20/2020    9:51 AM 02/25/2018    6:00 PM  Advanced Directives  Does Patient Have a Medical Advance Directive? Yes Yes Yes Yes Yes Yes   Type of Forensic scientist Power of [employer] Power of [place];Living will Healthcare Power of [place];Living will   Does patient want to make changes to medical advance directive?  Yes (Inpatient - patient defers changing a medical advance directive and declines information at this time) No - Patient declined    No - Patient declined  Copy of Healthcare Power of Attorney in Chart?  No - copy requested Yes - validated most recent copy scanned in chart (See row information) Yes - validated most recent copy scanned in chart (See row information) Yes - validated most recent copy scanned in chart (See row information) Yes - validated most recent copy scanned in chart (See row information) No - copy requested  Would patient like information on creating a medical advance directive?   No - Patient declined         Current Medications (verified) Outpatient Encounter Medications as of 02/05/2023  Medication Sig   acetaminophen (TYLENOL) 500 MG tablet Take 1,000 mg by mouth every 6 (six) hours as needed for mild pain or moderate pain.   acetaminophen (TYLENOL) 500 MG tablet Take by mouth.   amLODipine (NORVASC) 5 MG tablet Take 1 tablet (5 mg total) by mouth daily.   aspirin 81 MG tablet Take 81 mg by mouth daily.    atorvastatin (LIPITOR) 80 MG tablet Take 1 tablet (80 mg total) by mouth daily.   blood glucose meter kit and supplies KIT Dispense based on patient and insurance preference. Check sugars once daily   Cholecalciferol (VITAMIN D3 PO) Take 1 tablet by mouth daily.   clopidogrel (PLAVIX) 75 MG tablet Take 1 tablet (75 mg total) by mouth daily.   Dulaglutide (TRULICITY) 4.5 MG/0.5ML SOPN Inject 4.5 mg as directed once a week.   DULoxetine (CYMBALTA) 20 MG capsule Take 1 capsule (20 mg total) by mouth daily.   empagliflozin (JARDIANCE) 25 MG TABS tablet Take 1 tablet (25 mg total) by mouth daily.   ezetimibe (ZETIA) 10 MG tablet Take 1 tablet (10 mg total) by mouth daily.   fenofibrate (TRICOR) 145 MG tablet Take 1 tablet (145 mg total) by mouth daily.   furosemide (LASIX) 20 MG tablet Take 1 tablet (20 mg total) by mouth daily. Take 20 mg by mouth daily.   glucose blood (ONETOUCH VERIO) test strip TEST BLOOD  GLUCOSE EVERY DAY   hydrochlorothiazide (HYDRODIURIL) 25 MG tablet Take 1 tablet (25 mg total) by mouth daily.   HYDROcodone-acetaminophen (NORCO) 5-325 MG tablet Take 1-2 tablets by mouth every 6 (six) hours as needed for moderate pain or severe pain.   Lidocaine 4 % PTCH Apply 1 patch topically daily as needed (pain).   Liniments (BEN GAY EX) Apply 1 application topically daily as needed (pain).   lisinopril (ZESTRIL) 20 MG tablet Take 1 tablet (20 mg total) by mouth daily.   metFORMIN (GLUCOPHAGE) 1000 MG tablet Take 1 tablet (1,000 mg total) by mouth 2 (two) times daily with a meal.    metoprolol tartrate (LOPRESSOR) 50 MG tablet Take 1 tablet (50 mg total) by mouth 2 (two) times daily.   pantoprazole (PROTONIX) 40 MG tablet Take 1 tablet (40 mg total) by mouth daily.   tamsulosin (FLOMAX) 0.4 MG CAPS capsule Take 1 capsule (0.4 mg total) by mouth daily.   No facility-administered encounter medications on file as of 02/05/2023.    Allergies (verified) Amoxicillin, Fexofenadine, and Penicillins   History: Past Medical History:  Diagnosis Date   Arthritis    back   Cataracts, bilateral    a.) s/p extractions in 2018   Coronary artery disease 04/08/2008   a.)  LHC 04/08/2008: EF 48%; 75% stenosis mRCA --> PCI performed placing a 3.5 x 18 mm Xience V DES x1.   Depression    GERD (gastroesophageal reflux disease)    Glaucoma    Hyperlipidemia    Hypertension    Stage 3b chronic kidney disease    T2DM (type 2 diabetes mellitus)    Past Surgical History:  Procedure Laterality Date   CAROTID STENT     CARPOMETACARPAL (CMC) FUSION OF THUMB Right 11/01/2021   Procedure: CARPOMETACARPAL [facility]) FUSION OF THUMB;  Surgeon: Kennedy Bucker, MD;  Location: [facility] ORS;  Service: Orthopedics;  Laterality: Right;   CATARACT EXTRACTION W/PHACO Right 03/01/2017   Procedure: CATARACT EXTRACTION PHACO AND INTRAOCULAR LENS PLACEMENT (IOC);  Surgeon: Nevada Crane, MD;  Location: [facility] ORS;  Service: Ophthalmology;  Laterality: Right;  [place] 22.6 AP% 6.1 CDE 1.37 Fluid pack lot # [id number]   CATARACT EXTRACTION W/PHACO Left 05/01/2017   Procedure: CATARACT EXTRACTION PHACO AND INTRAOCULAR LENS PLACEMENT (IOC)  Left Diabetic;  Surgeon: Nevada Crane, MD;  Location: [facility];  Service: Ophthalmology;  Laterality: Left;  Block Diabetic   CORONARY ANGIOPLASTY WITH STENT PLACEMENT Left 04/08/2008   Procedure: CORONARY ANGIOPLASTY WITH STENT PLACEMENT (3.5 x 18 mm Xience V DES x1 to mRCA); Location: [facility]; Surgeon: Renato Battles, MD   POSTERIOR CERVICAL LAMINECTOMY N/A  02/25/2018   Procedure: POSTERIOR CERVICAL LAMINECTOMY-CERVICAL 3;  Surgeon: Ninfa Meeker, MD;  Location: [facility] OR[facility];  Service: Neurosurgery;  Laterality: N/A;   Family History  Problem Relation Age of Onset   Diabetes Mother        mellitus type2   Social History   Socioeconomic History   Marital status: Divorced    Spouse name: Not on file   Number of children: 2   Years of education: Not on file   Highest education level: 9th grade  Occupational History   Occupation: retired  Tobacco Use   Smoking status: Former    Types: Cigars    Quit date: 05/06/2020    Years since quitting: 2.7   Smokeless tobacco: Never   Tobacco comments:    smokes cigars-about 10-15 cigars a week, quit cigarettes 20+ years ago  Vaping Use   Vaping Use: Never used  Substance and Sexual Activity   Alcohol use: No   Drug use: No   Sexual activity: Not on file  Other Topics Concern   Not on file  Social History Narrative   Lives alone, illiterate, doesn't drive   Daughter, Aggie Cosier has power of attorney   Social Determinants of Health   Financial Resource Strain: Low Risk  (02/05/2023)   Overall Financial Resource Strain (CARDIA)    Difficulty of Paying Living Expenses: Not hard at all  Food Insecurity: No Food Insecurity (02/05/2023)   Hunger Vital Sign    Worried About Running Out of Food in the Last Year: Never true    Ran Out of Food in the Last Year: Never true  Transportation Needs: No Transportation Needs (02/05/2023)   PRAPARE - Administrator, Civil Service (Medical): No    Lack of Transportation (Non-Medical): No  Physical Activity: Inactive (02/05/2023)   Exercise Vital Sign    Days of Exercise per Week: 0 days    Minutes of Exercise per Session: 0 min  Stress: No Stress Concern Present (02/05/2023)   [employer] of Occupational Health - Occupational Stress Questionnaire    Feeling of Stress : Not at all  Social Connections: Moderately Integrated (02/05/2023)    Social Connection and Isolation Panel [NHANES]    Frequency of Communication with Friends and Family: More than three times a week    Frequency of Social Gatherings with Friends and Family: Three times a week    Attends Religious Services: 1 to 4 times per year    Active Member of Clubs or Organizations: No    Attends Banker Meetings: Never    Marital Status: Married    Tobacco Counseling Counseling given: Not Answered Tobacco comments: smokes cigars-about 10-15 cigars a week, quit cigarettes 20+ years ago  Clinical Intake:  Pre-visit preparation completed: Yes  Pain : No/denies pain   BMI - recorded: 26.09 Nutritional Status: BMI 25 -29 Overweight Nutritional Risks: None Diabetes: Yes CBG done?: No Did pt. bring in CBG monitor from home?: No  How often do you need to have someone help you when you read instructions, pamphlets, or other written materials from your doctor or pharmacy?: 1 - Never  Diabetic?no  Interpreter Needed?: No  Comments: lives alone Information entered by :: B.Amahd Morino,LPN   Activities of Daily Living    02/05/2023    1:46 PM 12/19/2022   10:21 AM  In your present state of health, do you have any difficulty performing the following activities:  Hearing?  0  Vision? 1 1  Difficulty concentrating or making decisions? 0 0  Walking or climbing stairs? 0 0  Dressing or bathing? 0 0  Doing errands, shopping? 0 0  Preparing Food and eating ? N   Using the Toilet? N   In the past six months, have you accidently leaked urine? N   Do you have problems with loss of bowel control? N   Managing your Medications? N   Managing your Finances? N   Housekeeping or managing your Housekeeping? N     Patient Care Team: Erasmo Downer, MD as PCP - General (Family Medicine) Marcina Millard, MD as Consulting Physician (Cardiology) Nevada Crane, MD as Consulting Physician (Ophthalmology) Leanora Ivanoff, PA-C  Indicate any recent  Medical Services you may have received from other than [facility] providers in the past year (date may be approximate).  Assessment:   This is a routine wellness examination for [name].  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 Dr Brooke Dare  Dietary issues and exercise activities discussed: Current Exercise Habits: The patient does not participate in regular exercise at present, Exercise limited by: neurologic condition(s)   Goals Addressed   None    Depression Screen    02/05/2023    1:42 PM 12/19/2022   10:20 AM 09/18/2022    2:21 PM 03/10/2022    8:28 AM 01/31/2022    2:15 PM 12/08/2021   10:22 AM 02/25/2021    8:58 AM  PHQ 2/9 Scores  PHQ - 2 Score 0 0 0 0 0 0 0  PHQ- 9 Score 0 0  3 0 4 3    Fall Risk    02/05/2023    1:40 PM 12/19/2022   10:20 AM 09/18/2022    2:21 PM 03/10/2022    8:28 AM 01/31/2022    2:17 PM  Fall Risk   Falls in the past year? 0 0 0 0 0  Number falls in past yr: 0 0 0 0 0  Injury with Fall? 0 0 0 0 0  Risk for fall due to : No Fall Risks No Fall Risks No Fall Risks  No Fall Risks  Follow up Education provided;Falls prevention discussed Falls evaluation completed   Falls evaluation completed    FALL RISK PREVENTION PERTAINING TO THE HOME:  Any stairs in or around the home? Yes outside If so, are there any without handrails? yes Home free of loose throw rugs in walkways, et beds, electrical cords, etc? Yes  Adequate lighting in your home to reduce risk of falls? Yes   ASSISTIVE DEVICES UTILIZED TO PREVENT FALLS:  Life alert? No  Use of a cane, walker or w/c? No  Grab bars in the bathroom? No  Shower chair or bench in shower? yes Elevated toilet seat or a handicapped toilet? No   Cognitive Function:        02/05/2023    1:47 PM 01/20/2020    9:55 AM  6CIT Screen  What Year? 0 points 0 points  What month? 0 points 0 points   What time? 0 points 0 points  Count back from 20 0 points 2 points  Months in reverse 2 points 2 points  Repeat phrase 0 points 6 points  Total Score 2 points 10 points    Immunizations Immunization History  Administered Date(s) Administered   Fluad Quad(high Dose 65+) 08/04/2019, 08/09/2020, 09/02/2021, 09/18/2022   Influenza, High Dose Seasonal PF 09/06/2015, 09/08/2016, 09/21/2017, 06/26/2018   Influenza-Unspecified 06/23/2014   PFIZER(Purple Top)SARS-COV-2 Vaccination 12/12/2019, 01/02/2020, 08/19/2020   Pneumococcal Conjugate-13 06/04/2015   Pneumococcal Polysaccharide-23 09/21/2017   Respiratory Syncytial Virus Vaccine,Recomb Aduvanted(Arexvy) 09/27/2022   Tdap 09/27/2012   Zoster Recombinat (Shingrix) 08/09/2020, 07/22/2022   Zoster, Live 09/06/2015    TDAP status: Up to date  Flu Vaccine status: Up to date  Pneumococcal vaccine status: Up to date  Covid-19 vaccine status: Completed vaccines  Qualifies for Shingles Vaccine? Yes   Zostavax completed Yes   Shingrix Completed?: Yes  Screening Tests Health Maintenance  Topic Date Due   COVID-19 Vaccine (4 - 2023-24 season) 06/23/2022   DTaP/Tdap/Td (2 - Td or Tdap) 09/27/2022   Diabetic kidney evaluation - Urine ACR  03/11/2023   INFLUENZA VACCINE  05/24/2023   HEMOGLOBIN A1C  06/19/2023  FOOT EXAM  09/19/2023   OPHTHALMOLOGY EXAM  12/07/2023   Diabetic kidney evaluation - eGFR measurement  12/20/2023   Medicare Annual Wellness (AWV)  02/05/2024   Fecal DNA (Cologuard)  04/15/2024   Pneumonia Vaccine 86+ Years old  Completed   Hepatitis C Screening  Completed   Zoster Vaccines- Shingrix  Completed   HPV VACCINES  Aged Out   COLONOSCOPY (Pts 45-34yrs Insurance coverage will need to be confirmed)  Discontinued    Health Maintenance  Health Maintenance Due  Topic Date Due   COVID-19 Vaccine (4 - 2023-24 season) 06/23/2022   DTaP/Tdap/Td (2 - Td or Tdap) 09/27/2022    Colorectal cancer screening: No  longer required. Per chart  Lung Cancer Screening: (Low Dose CT Chest recommended if Age 36-80 years, 30 pack-year currently smoking OR have quit w/in 15years.) does qualify.   Lung Cancer Screening Referral: yes  Additional Screening:  Hepatitis C Screening: does not qualify; Completed no  Vision Screening: Recommended annual ophthalmology exams for early detection of glaucoma and other disorders of the eye. Is the patient up to date with their annual eye exam?  Yes  Who is the provider or what is the name of the office in which the patient attends annual eye exams? Dr Brooke Dare If pt is not established with a provider, would they like to be referred to a provider to establish care? No .   Dental Screening: Recommended annual dental exams for proper oral hygiene  Community Resource Referral / Chronic Care Management: CRR required this visit?  No   CCM required this visit?  No     Plan:     I have personally reviewed and noted the following in the patient's chart:   Medical and social history Use of alcohol, tobacco or illicit drugs  Current medications and supplements including opioid prescriptions. Patient is currently taking opioid prescriptions. Information provided to patient regarding non-opioid alternatives. Patient advised to discuss non-opioid treatment plan with their provider. Functional ability and status Nutritional status Physical activity Advanced directives List of other physicians Hospitalizations, surgeries, and ER visits in previous 12 months Vitals Screenings to include cognitive, depression, and falls Referrals and appointments  In addition, I have reviewed and discussed with patient certain preventive protocols, quality metrics, and best practice recommendations. A written personalized care plan for preventive services as well as general preventive health recommendations were provided to patient.    Sue Lush, LPN   [id number]   Nurse Notes: The  patient states he is doing well and has no concerns or questions at this time.

## 2023-02-07 ENCOUNTER — Other Ambulatory Visit: Payer: Self-pay | Admitting: Family Medicine

## 2023-02-07 DIAGNOSIS — I152 Hypertension secondary to endocrine disorders: Secondary | ICD-10-CM

## 2023-02-07 NOTE — Telephone Encounter (Signed)
Medication Refill - Medication: HYDROcodone-acetaminophen (NORCO) 5-325 MG tablet [[id number]]   Has the patient contacted their pharmacy? Yes.    (Agent: If yes, when and what did the pharmacy advise?) Contact PCP   Preferred Pharmacy (with phone number or street name): [id number] - [place], [place] - [address] AT [facility] OF [address]   Has the patient been seen for an appointment in the last year OR does the patient have an upcoming appointment? Yes.    Agent: Please be advised that RX refills may take up to 3 business days. We ask that you follow-up with your pharmacy.   Pt has a few pills left

## 2023-02-08 MED ORDER — HYDROCHLOROTHIAZIDE 25 MG PO TABS: 25.0000 mg | ORAL_TABLET | Freq: Every day | ORAL | 0 refills | Status: DC

## 2023-02-08 NOTE — Telephone Encounter (Signed)
Requested Prescriptions  Pending Prescriptions Disp Refills   hydrochlorothiazide (HYDRODIURIL) 25 MG tablet 90 tablet 0    Sig: Take 1 tablet (25 mg total) by mouth daily.     Cardiovascular: Diuretics - Thiazide Passed - 02/07/2023  3:13 PM      Passed - Cr in normal range and within 180 days    Creatinine, Ser  Date Value Ref Range Status  12/19/2022 1.15 0.76 - 1.27 mg/dL Final         Passed - K in normal range and within 180 days    Potassium  Date Value Ref Range Status  12/19/2022 3.9 3.5 - 5.2 mmol/L Final         Passed - Na in normal range and within 180 days    Sodium  Date Value Ref Range Status  12/19/2022 140 134 - 144 mmol/L Final         Passed - Last BP in normal range    BP Readings from Last 1 Encounters:  12/19/22 116/75         Passed - Valid encounter within last 6 months    Recent Outpatient Visits           1 month ago Type 2 diabetes mellitus with hyperglycemia, without long-term current use of insulin (HCC)   [place] [facility] [place], Marzella Schlein, MD   4 months ago Type 2 diabetes mellitus with other specified complication, without long-term current use of insulin [facility])   [place] [facility] [place], Marzella Schlein, MD   8 months ago CAD in native artery   [facility] [place] M, DO   11 months ago Type 2 diabetes mellitus with other specified complication, without long-term current use of insulin [facility])   [place] [facility] [place], Marzella Schlein, MD   1 year ago Type 2 diabetes mellitus with other specified complication, without long-term current use of insulin [facility])   [facility] [place], Marzella Schlein, MD       Future Appointments             In 1 month [place], Marzella Schlein, MD [facility]

## 2023-02-13 ENCOUNTER — Telehealth: Payer: Self-pay

## 2023-02-13 DIAGNOSIS — E1159 Type 2 diabetes mellitus with other circulatory complications: Secondary | ICD-10-CM

## 2023-02-13 MED ORDER — HYDROCHLOROTHIAZIDE 25 MG PO TABS: 25.0000 mg | ORAL_TABLET | Freq: Every day | ORAL | 0 refills | Status: DC

## 2023-02-13 MED ORDER — HYDROCODONE-ACETAMINOPHEN 5-325 MG PO TABS: 1.0000 | ORAL_TABLET | Freq: Four times a day (QID) | ORAL | 0 refills | Status: DC | PRN

## 2023-02-13 MED ORDER — HYDROCHLOROTHIAZIDE 25 MG PO TABS: 25.0000 mg | ORAL_TABLET | Freq: Four times a day (QID) | ORAL | 0 refills | Status: DC | PRN

## 2023-02-13 NOTE — Addendum Note (Signed)
Addended by: Bing Neighbors on: 02/13/2023 03:35 PM   Modules accepted: Orders

## 2023-02-13 NOTE — Telephone Encounter (Signed)
Copied from CRM [id number]. Topic: General - Other >> Feb 13, 2023  1:02 PM Everette C wrote: Reason for CRM: The patient's daughter has called to follow up on a previous refill request of HYDROcodone-acetaminophen (NORCO) 5-325 MG tablet [[id number]]   Please contact the patient's daughter further when possible

## 2023-02-13 NOTE — Addendum Note (Signed)
Addended by: Bing Neighbors on: 02/13/2023 03:34 PM   Modules accepted: Orders

## 2023-02-15 ENCOUNTER — Other Ambulatory Visit: Payer: Self-pay | Admitting: Family Medicine

## 2023-02-15 DIAGNOSIS — I152 Hypertension secondary to endocrine disorders: Secondary | ICD-10-CM

## 2023-02-15 MED ORDER — HYDROCHLOROTHIAZIDE 25 MG PO TABS
25.0000 mg | ORAL_TABLET | Freq: Every day | ORAL | 0 refills | Status: DC
Start: 2023-02-15 — End: 2023-04-19

## 2023-02-28 DIAGNOSIS — N1832 Chronic kidney disease, stage 3b: Secondary | ICD-10-CM | POA: Diagnosis not present

## 2023-02-28 DIAGNOSIS — I10 Essential (primary) hypertension: Secondary | ICD-10-CM | POA: Diagnosis not present

## 2023-02-28 DIAGNOSIS — N182 Chronic kidney disease, stage 2 (mild): Secondary | ICD-10-CM | POA: Diagnosis not present

## 2023-02-28 DIAGNOSIS — E1122 Type 2 diabetes mellitus with diabetic chronic kidney disease: Secondary | ICD-10-CM | POA: Diagnosis not present

## 2023-03-01 LAB — MICROALBUMIN / CREATININE URINE RATIO: Microalb Creat Ratio: 91

## 2023-03-01 LAB — PROTEIN / CREATININE RATIO, URINE: Creatinine, Urine: 110

## 2023-03-07 DIAGNOSIS — I10 Essential (primary) hypertension: Secondary | ICD-10-CM | POA: Diagnosis not present

## 2023-03-07 DIAGNOSIS — E1122 Type 2 diabetes mellitus with diabetic chronic kidney disease: Secondary | ICD-10-CM | POA: Diagnosis not present

## 2023-03-07 DIAGNOSIS — N1831 Chronic kidney disease, stage 3a: Secondary | ICD-10-CM | POA: Diagnosis not present

## 2023-03-12 ENCOUNTER — Telehealth: Payer: Self-pay | Admitting: Family Medicine

## 2023-03-12 DIAGNOSIS — E1165 Type 2 diabetes mellitus with hyperglycemia: Secondary | ICD-10-CM

## 2023-03-12 NOTE — Telephone Encounter (Signed)
Patients daughter stated there is not a pharmacy around that has Dulaglutide (TRULICITY) 4.5 MG/0.5ML SOPN in stock. She is requesting samples until the pharmacy can restock the medication. Please advise

## 2023-03-13 MED ORDER — SEMAGLUTIDE (2 MG/DOSE) 8 MG/3ML ~~LOC~~ SOPN
2.0000 mg | PEN_INJECTOR | SUBCUTANEOUS | 3 refills | Status: DC
Start: 2023-03-13 — End: 2023-07-09

## 2023-03-13 NOTE — Telephone Encounter (Signed)
Will need to switch to Ozempic 2mg  weekly instead of trulicity. Ok to send in.

## 2023-03-13 NOTE — Telephone Encounter (Signed)
Pharmacy reports patient has been notified to pick up prescription. No PA needed.

## 2023-03-14 ENCOUNTER — Other Ambulatory Visit: Payer: Self-pay | Admitting: Family Medicine

## 2023-03-20 ENCOUNTER — Encounter: Payer: Self-pay | Admitting: Family Medicine

## 2023-03-22 ENCOUNTER — Encounter: Payer: Self-pay | Admitting: Family Medicine

## 2023-03-22 ENCOUNTER — Ambulatory Visit: Payer: No Typology Code available for payment source | Admitting: Family Medicine

## 2023-03-22 VITALS — BP 130/80 | HR 80 | Resp 16 | Ht 72.0 in | Wt 192.9 lb

## 2023-03-22 DIAGNOSIS — N182 Chronic kidney disease, stage 2 (mild): Secondary | ICD-10-CM

## 2023-03-22 DIAGNOSIS — I152 Hypertension secondary to endocrine disorders: Secondary | ICD-10-CM

## 2023-03-22 DIAGNOSIS — E1169 Type 2 diabetes mellitus with other specified complication: Secondary | ICD-10-CM | POA: Diagnosis not present

## 2023-03-22 DIAGNOSIS — E1165 Type 2 diabetes mellitus with hyperglycemia: Secondary | ICD-10-CM

## 2023-03-22 DIAGNOSIS — E1159 Type 2 diabetes mellitus with other circulatory complications: Secondary | ICD-10-CM

## 2023-03-22 DIAGNOSIS — E785 Hyperlipidemia, unspecified: Secondary | ICD-10-CM

## 2023-03-22 DIAGNOSIS — G4700 Insomnia, unspecified: Secondary | ICD-10-CM | POA: Diagnosis not present

## 2023-03-22 LAB — POCT GLYCOSYLATED HEMOGLOBIN (HGB A1C)
Est. average glucose Bld gHb Est-mCnc: 186
Hemoglobin A1C: 8.1 % — AB (ref 4.0–5.6)

## 2023-03-22 MED ORDER — TRAZODONE HCL 50 MG PO TABS: 25.0000 mg | ORAL_TABLET | Freq: Every evening | ORAL | 3 refills | Status: DC | PRN

## 2023-03-22 NOTE — Assessment & Plan Note (Signed)
Wakes in the middle of the night with racing thoughts.  Start trazodone 50 mg nightly as needed. If no improvement consider increasing Cymbalta.

## 2023-03-22 NOTE — Assessment & Plan Note (Signed)
Well controlled. Continue HCTZ, stop furosemide. Does not have lower leg edema. Follow up with cardiology to reassess BP medications.

## 2023-03-22 NOTE — Assessment & Plan Note (Signed)
Chronic and stable Followed by nephrology Avoid nephrotoxic meds 

## 2023-03-22 NOTE — Assessment & Plan Note (Signed)
Previously well controlled Continue statin Goal LDL < 70  

## 2023-03-22 NOTE — Progress Notes (Signed)
Established Patient Office Visit  Subjective   Patient ID: [name], male    DOB: 1951/09/10  Age: 72 y.o. MRN: [id number]  Chief Complaint  Patient presents with   follow-up DM    HPI Diabetes Mellitus Type 2 Started Ozempic 2 mg last week. Switched from trulicity 4.5 mg weekly due to low supply  Feels like it is not helping his blood sugar. Morning fasting blood sugar ranges 150 to 399. Has episodes of dizziness, vision changes, and weakness. Does not cook, eats pre made meals or eats out.    Sleeping difficulties Goes to sleep around midnight and wakes around 2 am to 3 am and he feels wide awake.  Impacting daily functioning.  Wants to try medication.       Review of Systems  Constitutional:  Negative for chills and fever.  HENT:  Negative for hearing loss and tinnitus.       Objective:     BP 130/80 (BP Location: Right Arm, Patient Position: Sitting, Cuff Size: Normal)   Pulse 80   Resp 16   Ht 6' (1.829 m)   Wt 192 lb 14.4 oz (87.5 kg)   SpO2 96%   BMI 26.16 kg/m    Physical Exam Constitutional:      General: He is not in acute distress. HENT:     Head: Normocephalic and atraumatic.  Cardiovascular:     Pulses: Normal pulses.     Heart sounds: Normal heart sounds.  Pulmonary:     Effort: Pulmonary effort is normal.     Breath sounds: Normal breath sounds.  Musculoskeletal:     Cervical back: Normal range of motion and neck supple.  Skin:    General: Skin is warm and dry.     Capillary Refill: Capillary refill takes less than 2 seconds.  Neurological:     Mental Status: He is alert.      Results for orders placed or performed in visit on 03/22/23  Microalbumin / creatinine urine ratio  Result Value Ref Range   Microalb Creat Ratio 91   Protein / creatinine ratio, urine  Result Value Ref Range   Creatinine, Urine 110   POCT glycosylated hemoglobin (Hb A1C)  Result Value Ref Range   Hemoglobin A1C 8.1 (A) 4.0 - 5.6 %   Est.  average glucose Bld gHb Est-mCnc 186       The 10-year ASCVD risk score (Arnett DK, et al., 2019) is: 44.3%    Assessment & Plan:   Problem List Items Addressed This Visit     Hypertension associated with diabetes (HCC)    Well controlled. Continue HCTZ, stop furosemide. Does not have lower leg edema. Follow up with cardiology to reassess BP medications.       Hyperlipidemia associated with type 2 diabetes mellitus (HCC)    Previously well controlled.  Continue statin. Goal LDL < 70.       Insomnia    Wakes in the middle of the night with racing thoughts.  Start trazodone 50 mg nightly as needed. If no improvement consider increasing Cymbalta.       T2DM (type 2 diabetes mellitus) (HCC) - Primary    Chronic and poorly controlled. Continue current medications Recheck A1c today. UTD on eye exam and foot exam.  Nephrology completed UACR. Discussed diet and exercise. F/u in 3 months.         Relevant Orders   POCT glycosylated hemoglobin (Hb A1C) (Completed)   CKD (chronic kidney  disease) stage 2, GFR 60-89 ml/min    Chronic and stable. Followed by nephrology.  Avoid nephrotoxic meds.      For CKD, stop lasix. Do not see reason for multiple diuretics  Return in about 3 months (around 06/22/2023) for chronic disease f/u.    Gilmer Mor, Medical Student   Patient seen along with MS3 student Ezekiel Slocumb. I personally evaluated this patient along with the student, and verified all aspects of the history, physical exam, and medical decision making as documented by the student. I agree with the student's documentation and have made all necessary edits.  Maat Kafer, Marzella Schlein, MD, MPH [facility]

## 2023-03-22 NOTE — Assessment & Plan Note (Addendum)
Chronic and poorly controlled. Continue current medications Recheck A1c today. UTD on eye exam and foot exam.  Nephrology completed UACR. Discussed diet and exercise. F/u in 3 months.

## 2023-03-23 NOTE — Addendum Note (Signed)
Addended by: Erasmo Downer on: 03/23/2023 08:15 AM   Modules accepted: Level of Service

## 2023-03-27 ENCOUNTER — Telehealth: Payer: Self-pay

## 2023-03-27 DIAGNOSIS — E1165 Type 2 diabetes mellitus with hyperglycemia: Secondary | ICD-10-CM

## 2023-03-27 NOTE — Telephone Encounter (Signed)
Copied from CRM [id number]. Topic: Referral - Request for Referral >> Mar 26, 2023  3:05 PM Dondra Prader A wrote: Has patient seen PCP for this complaint? No. *If NO, is insurance requiring patient see PCP for this issue before PCP can refer them? Referral for which specialty: Podiatrists Preferred provider/office: Would like for PCP to refer. Reason for referral: Per [name] pt daughter is needing pt to be referred to a Podiatrists for his toe nails to get cut.

## 2023-03-27 NOTE — Addendum Note (Signed)
Addended by: Hyacinth Meeker on: 03/27/2023 11:13 AM   Modules accepted: Orders

## 2023-03-27 NOTE — Telephone Encounter (Signed)
Ok to send referral as requested. Use DM diagnosis from his problem list.

## 2023-03-30 DIAGNOSIS — N1832 Chronic kidney disease, stage 3b: Secondary | ICD-10-CM | POA: Diagnosis not present

## 2023-03-30 DIAGNOSIS — Z9889 Other specified postprocedural states: Secondary | ICD-10-CM | POA: Diagnosis not present

## 2023-03-30 DIAGNOSIS — I739 Peripheral vascular disease, unspecified: Secondary | ICD-10-CM | POA: Diagnosis not present

## 2023-03-30 DIAGNOSIS — Z955 Presence of coronary angioplasty implant and graft: Secondary | ICD-10-CM | POA: Diagnosis not present

## 2023-03-30 DIAGNOSIS — I10 Essential (primary) hypertension: Secondary | ICD-10-CM | POA: Diagnosis not present

## 2023-03-30 DIAGNOSIS — E785 Hyperlipidemia, unspecified: Secondary | ICD-10-CM | POA: Diagnosis not present

## 2023-03-30 DIAGNOSIS — I2510 Atherosclerotic heart disease of native coronary artery without angina pectoris: Secondary | ICD-10-CM | POA: Diagnosis not present

## 2023-03-30 DIAGNOSIS — E119 Type 2 diabetes mellitus without complications: Secondary | ICD-10-CM | POA: Diagnosis not present

## 2023-04-05 ENCOUNTER — Ambulatory Visit: Payer: No Typology Code available for payment source | Admitting: Podiatry

## 2023-04-05 ENCOUNTER — Encounter: Payer: Self-pay | Admitting: Family Medicine

## 2023-04-05 ENCOUNTER — Ambulatory Visit (INDEPENDENT_AMBULATORY_CARE_PROVIDER_SITE_OTHER): Payer: No Typology Code available for payment source | Admitting: Family Medicine

## 2023-04-05 VITALS — BP 94/66 | HR 83 | Temp 98.4°F | Wt 189.0 lb

## 2023-04-05 DIAGNOSIS — E1159 Type 2 diabetes mellitus with other circulatory complications: Secondary | ICD-10-CM | POA: Diagnosis not present

## 2023-04-05 DIAGNOSIS — R42 Dizziness and giddiness: Secondary | ICD-10-CM | POA: Diagnosis not present

## 2023-04-05 DIAGNOSIS — I152 Hypertension secondary to endocrine disorders: Secondary | ICD-10-CM

## 2023-04-05 MED ORDER — AMLODIPINE BESYLATE 5 MG PO TABS: 5.0000 mg | ORAL_TABLET | Freq: Every day | ORAL | 0 refills | Status: DC

## 2023-04-05 NOTE — Assessment & Plan Note (Signed)
Acute, unknown Pt believes symptoms are in setting of switching from trulicity to ozempic; however, no low blood sugars noted or hypovolemia iso GI concerns Check chem panel today

## 2023-04-05 NOTE — Assessment & Plan Note (Signed)
Chronic, acutely low BP today Endorses similar low readings at home Denies illness or sick contacts Endorses good PO intake Denies low BG; reports FBGs around 250s this morning Stop norvasc at this time; repeat BMP -continue hctz 25 mg, lisinopril 20 mg and metop 50 mg BID -recently stopped daily lasix iso CKD Additional communication sent to cardiology

## 2023-04-05 NOTE — Progress Notes (Signed)
Established patient visit  Patient: [name]   DOB: Feb 24, 1951   72 y.o. Male  MRN: [id number] Visit Date: 04/05/2023  Today's healthcare provider: Jacky Kindle, FNP  Introduced to nurse practitioner role and practice setting.  All questions answered.  Discussed provider/patient relationship and expectations.  Chief Complaint  Patient presents with   Dizziness   Subjective    Dizziness   HPI   Pt stated--everytime taking the ozempic causes dizziness Last edited by Shelly Bombard, CMA on 04/05/2023  1:47 PM.      Medications: Outpatient Medications Prior to Visit  Medication Sig   acetaminophen (TYLENOL) 500 MG tablet Take 1,000 mg by mouth every 6 (six) hours as needed for mild pain or moderate pain.   acetaminophen (TYLENOL) 500 MG tablet Take by mouth.   aspirin 81 MG tablet Take 81 mg by mouth daily.    atorvastatin (LIPITOR) 80 MG tablet Take 1 tablet (80 mg total) by mouth daily.   blood glucose meter kit and supplies KIT Dispense based on patient and insurance preference. Check sugars once daily   Cholecalciferol (VITAMIN D3 PO) Take 1 tablet by mouth daily.   clopidogrel (PLAVIX) 75 MG tablet Take 1 tablet (75 mg total) by mouth daily.   DULoxetine (CYMBALTA) 20 MG capsule Take 1 capsule (20 mg total) by mouth daily.   empagliflozin (JARDIANCE) 25 MG TABS tablet Take 1 tablet (25 mg total) by mouth daily.   ezetimibe (ZETIA) 10 MG tablet Take 1 tablet (10 mg total) by mouth daily.   fenofibrate (TRICOR) 145 MG tablet Take 1 tablet (145 mg total) by mouth daily.   glucose blood (ONETOUCH VERIO) test strip TEST BLOOD GLUCOSE EVERY DAY   hydrochlorothiazide (HYDRODIURIL) 25 MG tablet Take 1 tablet (25 mg total) by mouth daily.   HYDROcodone-acetaminophen (NORCO/VICODIN) 5-325 MG tablet TAKE 1 TABLET BY MOUTH EVERY 6 HOURS AS NEEDED FOR MODERATE PAIN.   Lidocaine 4 % PTCH Apply 1 patch topically daily as needed (pain).   Liniments (BEN GAY EX) Apply 1 application  topically daily as needed (pain).   lisinopril (ZESTRIL) 20 MG tablet Take 1 tablet (20 mg total) by mouth daily.   metFORMIN (GLUCOPHAGE) 1000 MG tablet Take 1 tablet (1,000 mg total) by mouth 2 (two) times daily with a meal.   metoprolol tartrate (LOPRESSOR) 50 MG tablet Take 1 tablet (50 mg total) by mouth 2 (two) times daily.   pantoprazole (PROTONIX) 40 MG tablet Take 1 tablet (40 mg total) by mouth daily.   Semaglutide, 2 MG/DOSE, 8 MG/3ML SOPN Inject 2 mg as directed once a week.   tamsulosin (FLOMAX) 0.4 MG CAPS capsule Take 1 capsule (0.4 mg total) by mouth daily.   traZODone (DESYREL) 50 MG tablet Take 0.5-1 tablets (25-50 mg total) by mouth at bedtime as needed for sleep.   [DISCONTINUED] amLODipine (NORVASC) 5 MG tablet Take 1 tablet (5 mg total) by mouth daily.   No facility-administered medications prior to visit.    Review of Systems  Neurological:  Positive for dizziness.    Last CBC Lab Results  Component Value Date   WBC 7.2 12/19/2022   HGB 14.3 12/19/2022   HCT 43.1 12/19/2022   MCV 87 12/19/2022   MCH 28.7 12/19/2022   RDW 13.5 12/19/2022   PLT 254 12/19/2022   Last metabolic panel Lab Results  Component Value Date   GLUCOSE 140 (H) 12/19/2022   NA 140 12/19/2022   K 3.9 12/19/2022  CL 99 12/19/2022   CO2 24 12/19/2022   BUN 19 12/19/2022   CREATININE 1.15 12/19/2022   EGFR 68 12/19/2022   CALCIUM 10.1 12/19/2022   PROT 6.9 12/19/2022   ALBUMIN 4.6 12/19/2022   LABGLOB 2.3 12/19/2022   AGRATIO 2.0 12/19/2022   BILITOT 0.5 12/19/2022   ALKPHOS 36 (L) 12/19/2022   AST 18 12/19/2022   ALT 22 12/19/2022   ANIONGAP 9 10/21/2021   Last hemoglobin A1c Lab Results  Component Value Date   HGBA1C 8.1 (A) 03/22/2023       Objective    BP 94/66 (BP Location: Right Arm, Patient Position: Sitting, Cuff Size: Normal)   Pulse 83   Temp 98.4 F (36.9 C)   Wt 189 lb (85.7 kg)   SpO2 97%   BMI 25.63 kg/m  BP Readings from Last 3 Encounters:   04/05/23 94/66  03/22/23 130/80  12/19/22 116/75   Wt Readings from Last 3 Encounters:  04/05/23 189 lb (85.7 kg)  03/22/23 192 lb 14.4 oz (87.5 kg)  02/05/23 192 lb 6.4 oz (87.3 kg)   SpO2 Readings from Last 3 Encounters:  04/05/23 97%  03/22/23 96%  12/08/21 99%      Physical Exam Vitals and nursing note reviewed.  Constitutional:      Appearance: Normal appearance. He is normal weight.  HENT:     Head: Normocephalic and atraumatic.  Cardiovascular:     Rate and Rhythm: Normal rate and regular rhythm.     Pulses: Normal pulses.     Heart sounds: Normal heart sounds.  Pulmonary:     Effort: Pulmonary effort is normal.     Breath sounds: Normal breath sounds.  Musculoskeletal:        General: Normal range of motion.     Cervical back: Normal range of motion.  Skin:    General: Skin is warm and dry.     Capillary Refill: Capillary refill takes less than 2 seconds.     Comments: Non tenting skin   Neurological:     General: No focal deficit present.     Mental Status: He is alert and oriented to person, place, and time. Mental status is at baseline.  Psychiatric:        Mood and Affect: Mood normal.        Behavior: Behavior normal.        Thought Content: Thought content normal.        Judgment: Judgment normal.    No results found for any visits on 04/05/23.  Assessment & Plan     Problem List Items Addressed This Visit       Cardiovascular and Mediastinum   Hypertension associated with diabetes (HCC)    Chronic, acutely low BP today Endorses similar low readings at home Denies illness or sick contacts Endorses good PO intake Denies low BG; reports FBGs around 250s this morning Stop norvasc at this time; repeat BMP -continue hctz 25 mg, lisinopril 20 mg and metop 50 mg BID -recently stopped daily lasix iso CKD Additional communication sent to cardiology       Relevant Medications   amLODipine (NORVASC) 5 MG tablet     Other   Dizziness - Primary     Acute, unknown Pt believes symptoms are in setting of switching from trulicity to ozempic; however, no low blood sugars noted or hypovolemia iso GI concerns Check chem panel today      Relevant Orders   Comprehensive Metabolic Panel (CMET)  Return in about 1 week (around 04/12/2023) for HTN management, blood pressure check.     Leilani Merl, FNP, have reviewed all documentation for this visit. The documentation on 04/05/23 for the exam, diagnosis, procedures, and orders are all accurate and complete.  Jacky Kindle, FNP  [facility] [phone] (phone) [phone] (fax)  [facility]

## 2023-04-06 ENCOUNTER — Telehealth: Payer: Self-pay | Admitting: *Deleted

## 2023-04-06 ENCOUNTER — Other Ambulatory Visit: Payer: Self-pay | Admitting: Family Medicine

## 2023-04-06 DIAGNOSIS — R7989 Other specified abnormal findings of blood chemistry: Secondary | ICD-10-CM

## 2023-04-06 DIAGNOSIS — R42 Dizziness and giddiness: Secondary | ICD-10-CM

## 2023-04-06 DIAGNOSIS — I152 Hypertension secondary to endocrine disorders: Secondary | ICD-10-CM

## 2023-04-06 LAB — COMPREHENSIVE METABOLIC PANEL
ALT: 19 IU/L (ref 0–44)
AST: 18 IU/L (ref 0–40)
Albumin/Globulin Ratio: 2
Albumin: 4.7 g/dL (ref 3.8–4.8)
Alkaline Phosphatase: 34 IU/L — ABNORMAL LOW (ref 44–121)
BUN/Creatinine Ratio: 20 (ref 10–24)
BUN: 33 mg/dL — ABNORMAL HIGH (ref 8–27)
Bilirubin Total: 0.3 mg/dL (ref 0.0–1.2)
CO2: 21 mmol/L (ref 20–29)
Calcium: 9.8 mg/dL (ref 8.6–10.2)
Chloride: 100 mmol/L (ref 96–106)
Creatinine, Ser: 1.61 mg/dL — ABNORMAL HIGH (ref 0.76–1.27)
Globulin, Total: 2.3 g/dL (ref 1.5–4.5)
Glucose: 167 mg/dL — ABNORMAL HIGH (ref 70–99)
Potassium: 4.2 mmol/L (ref 3.5–5.2)
Sodium: 138 mmol/L (ref 134–144)
Total Protein: 7 g/dL (ref 6.0–8.5)
eGFR: 45 mL/min/{1.73_m2} — ABNORMAL LOW (ref >59)

## 2023-04-06 NOTE — Telephone Encounter (Signed)
Pt daughter --pt B/p dropping down 90/50-lowest ready,  170/80 --highest reading still dizziness. Please advise.

## 2023-04-06 NOTE — Progress Notes (Signed)
Significant increase in kidney numbers indicating poor fluid balance at kidneys or side effect from medication; ensure 48 oz/water per day and plan to repeat BMP in 1-2 weeks. If elevation remains, recommend stopping medication to allow for kidney function to stabilize and revise glycemic control plan.

## 2023-04-06 NOTE — Telephone Encounter (Signed)
Patients daughter advised and verbalized understanding. Reported he did take his medications today and it was a little better but she will make sure he stops medications for now

## 2023-04-11 ENCOUNTER — Other Ambulatory Visit: Payer: Self-pay | Admitting: Family Medicine

## 2023-04-12 NOTE — Telephone Encounter (Signed)
Requested medication (s) are due for refill today: yes  Requested medication (s) are on the active medication list: yes  Last refill:  03/15/23  Future visit scheduled: yes  Notes to clinic:  Unable to refill per protocol, cannot delegate.      Requested Prescriptions  Pending Prescriptions Disp Refills   HYDROcodone-acetaminophen (NORCO/VICODIN) 5-325 MG tablet [Pharmacy Med Name: HYDROCODONE-APAP 5-325 MG TAB] 60 tablet     Sig: TAKE 1 TABLET BY MOUTH EVERY 6 HOURS AS NEEDED FOR MODERATE PAIN.     Not Delegated - Analgesics:  Opioid Agonist Combinations Failed - 04/11/2023  5:14 PM      Failed - This refill cannot be delegated      Failed - Urine Drug Screen completed in last 360 days      Passed - Valid encounter within last 3 months    Recent Outpatient Visits           1 week ago Dizziness   [facility] Merita Norton T, FNP   3 weeks ago Type 2 diabetes mellitus with hyperglycemia, without long-term current use of insulin [facility])   [facility] [place], Marzella Schlein, MD   3 months ago Type 2 diabetes mellitus with hyperglycemia, without long-term current use of insulin [facility])   [facility] [place], Marzella Schlein, MD   6 months ago Type 2 diabetes mellitus with other specified complication, without long-term current use of insulin [facility])   [place] [facility], Marzella Schlein, MD   10 months ago CAD in native artery   [facility] [place], Darl Householder, DO       Future Appointments             In 5 days Logan Bores, Larena Glassman, DPM [place] [facility] [place], [facility]   In 2 months [place], Marzella Schlein, MD [facility], [facility]

## 2023-04-17 ENCOUNTER — Ambulatory Visit: Payer: No Typology Code available for payment source | Admitting: Podiatry

## 2023-04-17 DIAGNOSIS — M79674 Pain in right toe(s): Secondary | ICD-10-CM | POA: Diagnosis not present

## 2023-04-17 DIAGNOSIS — M79675 Pain in left toe(s): Secondary | ICD-10-CM

## 2023-04-17 DIAGNOSIS — B351 Tinea unguium: Secondary | ICD-10-CM

## 2023-04-17 DIAGNOSIS — R7989 Other specified abnormal findings of blood chemistry: Secondary | ICD-10-CM | POA: Diagnosis not present

## 2023-04-17 NOTE — Progress Notes (Signed)
   Chief Complaint  Patient presents with   Nail Problem    Patient came in today for Diabetic foot care, A1c- 7.2 BG- 181, nail trim, nail are thick and yellow     SUBJECTIVE Patient with a history of diabetes mellitus presents to office today complaining of elongated, thickened nails that cause pain while ambulating in shoes.  Patient is unable to trim their own nails. Patient is here for further evaluation and treatment.  Past Medical History:  Diagnosis Date   Arthritis    back   Cataracts, bilateral    a.) s/p extractions in 2018   Coronary artery disease 04/08/2008   a.)  LHC 04/08/2008: EF 48%; 75% stenosis mRCA --> PCI performed placing a 3.5 x 18 mm Xience V DES x1.   Depression    GERD (gastroesophageal reflux disease)    Glaucoma    Hyperlipidemia    Hypertension    Stage 3b chronic kidney disease (HCC)    T2DM (type 2 diabetes mellitus) (HCC)     Allergies  Allergen Reactions   Amoxicillin     diarrhea and headache Has patient had a PCN reaction causing immediate rash, facial/tongue/throat swelling, SOB or lightheadedness with hypotension: No Has patient had a PCN reaction causing severe rash involving mucus membranes or skin necrosis: No Has patient had a PCN reaction that required hospitalization: No Has patient had a PCN reaction occurring within the last 10 years: Unknown If all of the above answers are "NO", then may proceed with Cephalosporin use.    Fexofenadine     Unknown reaction    Penicillins      OBJECTIVE General Patient is awake, alert, and oriented x 3 and in no acute distress. Derm Skin is dry and supple bilateral. Negative open lesions or macerations. Remaining integument unremarkable. Nails are tender, long, thickened and dystrophic with subungual debris, consistent with onychomycosis, 1-5 bilateral. No signs of infection noted. Vasc  DP and PT pedal pulses palpable bilaterally. Temperature gradient within normal limits.  Neuro Epicritic  and protective threshold sensation diminished bilaterally.  Musculoskeletal Exam No symptomatic pedal deformities noted bilateral. Muscular strength within normal limits.  ASSESSMENT 1. Diabetes Mellitus w/ peripheral neuropathy 2.  Pain due to onychomycosis of toenails bilateral  PLAN OF CARE 1. Patient evaluated today. 2. Instructed to maintain good pedal hygiene and foot care. Stressed importance of controlling blood sugar.  3. Mechanical debridement of nails 1-5 bilaterally performed using a nail nipper. Filed with dremel without incident.  4. Return to clinic in 3 mos. for routine diabetic footcare    Felecia Shelling, DPM [facility]  Dr. Felecia Shelling, DPM    [address] [place], [place] [address]                Office [phone]  Fax [phone]

## 2023-04-18 ENCOUNTER — Telehealth: Payer: Self-pay

## 2023-04-18 LAB — BASIC METABOLIC PANEL
BUN/Creatinine Ratio: 16 (ref 10–24)
BUN: 16 mg/dL (ref 8–27)
CO2: 21 mmol/L (ref 20–29)
Calcium: 10 mg/dL (ref 8.6–10.2)
Chloride: 104 mmol/L (ref 96–106)
Creatinine, Ser: 0.99 mg/dL (ref 0.76–1.27)
Glucose: 111 mg/dL — ABNORMAL HIGH (ref 70–99)
Potassium: 4.3 mmol/L (ref 3.5–5.2)
Sodium: 139 mmol/L (ref 134–144)
eGFR: 81 mL/min/{1.73_m2} (ref >59)

## 2023-04-18 NOTE — Telephone Encounter (Signed)
Patient brought home BP readings. Readings after D/C of hydrochlorothiazide and Norvasc. Patient advised on 04/06/23 to stop medications. And if BP remains <90 or ongoing complaints of dizziness to seek emergent care.   117/78 78 123/77 77 126/81 74 126/72 70 123/81 79 120/74 68 137/83 75 131/83 77 134/88 68

## 2023-04-19 ENCOUNTER — Other Ambulatory Visit: Payer: Self-pay

## 2023-04-19 NOTE — Telephone Encounter (Signed)
Patient and daughter advised.

## 2023-04-19 NOTE — Telephone Encounter (Signed)
BP looks much better off of these meds. Do not resume. Please remove from med list

## 2023-04-23 ENCOUNTER — Ambulatory Visit: Payer: Self-pay

## 2023-04-23 ENCOUNTER — Encounter: Payer: Self-pay | Admitting: Family Medicine

## 2023-04-23 ENCOUNTER — Ambulatory Visit (INDEPENDENT_AMBULATORY_CARE_PROVIDER_SITE_OTHER): Payer: No Typology Code available for payment source | Admitting: Family Medicine

## 2023-04-23 VITALS — BP 151/81 | HR 69 | Temp 97.7°F | Resp 16 | Ht 72.0 in | Wt 196.8 lb

## 2023-04-23 DIAGNOSIS — I152 Hypertension secondary to endocrine disorders: Secondary | ICD-10-CM

## 2023-04-23 DIAGNOSIS — E1159 Type 2 diabetes mellitus with other circulatory complications: Secondary | ICD-10-CM

## 2023-04-23 MED ORDER — AMLODIPINE BESYLATE 5 MG PO TABS
5.0000 mg | ORAL_TABLET | Freq: Every day | ORAL | 1 refills | Status: DC
Start: 2023-04-23 — End: 2023-10-08

## 2023-04-23 NOTE — Telephone Encounter (Signed)
  Chief Complaint: Advice Symptoms: High BP readings Frequency:  Pertinent Negatives: Patient denies  Disposition: [] ED /[] Urgent Care (no appt availability in office) / [] Appointment(In office/virtual)/ []  [facility]/ [] Home Care/ [] Refused Recommended Disposition /[] [facility]/ [x]  Follow-up with PCP Additional Notes:Spoke with Daughter, Pt had been taken off of BP medications. Pt is now having higher BP readings. Daughter was wondering if pt should be seen or if he should just restart bp meds.     Summary: BP concerns 145/92   The patient's daughter stated the patient's BP is 145/92 and an appointment was made to come in today; however, she doesn't think the appointment is needed. She stated that she just needs the OK; he needs to be put back on his BP medication.  Daughter seeking clinical advice.  Pt has appointment for today at 3:20       Reason for Disposition  Requesting regular office appointment  Answer Assessment - Initial Assessment Questions 1. REASON FOR CALL or QUESTION: "What is your reason for calling today?" or "How can I best help you?" or "What question do you have that I can help answer?"     Daughter wanted to know if it was necessary for pt to have OV.  Protocols used: Information Only Call - No Triage-A-AH

## 2023-04-23 NOTE — Assessment & Plan Note (Signed)
Blood pressure elevated (140s to 150s over high 80s to mid 90s) after discontinuation of amlodipine and hydrochlorothiazide due to episodes of dizziness and dehydration. No current edema noted. -Resume amlodipine 5mg  daily. - continue lisinopril and metoprolol -Continue monitoring blood pressure at home and report if persistently high or low.

## 2023-04-23 NOTE — Progress Notes (Signed)
I,[name],acting as a scribe for [name], MD.,have documented all relevant documentation on the behalf of [name], MD,as directed by  [name], MD while in the presence of [name], MD.     Established patient visit   Patient: [name]   DOB: Nov 28, 1950   72 y.o. Male  MRN: [id number] Visit Date: 04/23/2023  Today's healthcare provider: [name], MD   Chief Complaint  Patient presents with   Hypertension   Subjective    HPI  Hypertension, follow-up  BP Readings from Last 3 Encounters:  04/23/23 (!) 151/81  04/05/23 94/66  03/22/23 130/80   Wt Readings from Last 3 Encounters:  04/23/23 196 lb 12.8 oz (89.3 kg)  04/05/23 189 lb (85.7 kg)  03/22/23 192 lb 14.4 oz (87.5 kg)     He was last seen for hypertension 2 weeks ago.  BP at that visit was 94/66. Management since that visit includes stop norvasc, and HCTZ.  He reports excellent compliance with treatment. He is not having side effects.   Outside blood pressures are elevated 145/87, 144/94, 149/85, 152/94. Symptoms: No chest pain No chest pressure  No palpitations No syncope  No dyspnea No orthopnea  No paroxysmal nocturnal dyspnea No lower extremity edema   Pertinent labs Lab Results  Component Value Date   CHOL 146 12/19/2022   HDL 29 (L) 12/19/2022   LDLCALC 91 12/19/2022   TRIG 146 12/19/2022   CHOLHDL 5.0 12/19/2022   Lab Results  Component Value Date   NA 139 04/17/2023   K 4.3 04/17/2023   CREATININE 0.99 04/17/2023   EGFR 81 04/17/2023   GLUCOSE 111 (H) 04/17/2023     The 10-year ASCVD risk score (Arnett DK, et al., 2019) is: 53.5%  ---------------------------------------------------------------------------------------------------  Discussed the use of AI scribe software for clinical note transcription with the patient, who gave verbal consent to proceed.  History of Present Illness   The patient, with a history of hypertension  and diabetes, presents with elevated blood pressure and headaches. He had been taken off some of their blood pressure medications due to episodes of dizziness and low blood pressure. However, since then, his blood pressure has increased to the 140s to 150s over high 80s to mid 90s. The patient has been experiencing headaches for two days, which he attributes to his elevated blood pressure. He reports no swelling. The patient has also been dealing with personal stressors, including a recent change in their family dynamics, which he believes may be contributing to his elevated blood pressure. The patient has been monitoring his blood pressure at home and has made dietary changes to manage his diabetes. His blood sugar levels have been in the 120s.        Medications: Outpatient Medications Prior to Visit  Medication Sig   acetaminophen (TYLENOL) 500 MG tablet Take 1,000 mg by mouth every 6 (six) hours as needed for mild pain or moderate pain.   aspirin 81 MG tablet Take 81 mg by mouth daily.    atorvastatin (LIPITOR) 80 MG tablet Take 1 tablet (80 mg total) by mouth daily.   blood glucose meter kit and supplies KIT Dispense based on patient and insurance preference. Check sugars once daily   Cholecalciferol (VITAMIN D3 PO) Take 1 tablet by mouth daily.   clopidogrel (PLAVIX) 75 MG tablet Take 1 tablet (75 mg total) by mouth daily.   DULoxetine (CYMBALTA) 20 MG capsule Take 1 capsule (20 mg total) by mouth daily.  empagliflozin (JARDIANCE) 25 MG TABS tablet Take 1 tablet (25 mg total) by mouth daily.   ezetimibe (ZETIA) 10 MG tablet Take 1 tablet (10 mg total) by mouth daily.   fenofibrate (TRICOR) 145 MG tablet Take 1 tablet (145 mg total) by mouth daily.   glucose blood (ONETOUCH VERIO) test strip TEST BLOOD GLUCOSE EVERY DAY   HYDROcodone-acetaminophen (NORCO/VICODIN) 5-325 MG tablet TAKE 1 TABLET BY MOUTH EVERY 6 HOURS AS NEEDED FOR MODERATE PAIN.   Lidocaine 4 % PTCH Apply 1 patch topically  daily as needed (pain).   Liniments (BEN GAY EX) Apply 1 application topically daily as needed (pain).   lisinopril (ZESTRIL) 20 MG tablet Take 1 tablet (20 mg total) by mouth daily.   metFORMIN (GLUCOPHAGE) 1000 MG tablet Take 1 tablet (1,000 mg total) by mouth 2 (two) times daily with a meal.   metoprolol tartrate (LOPRESSOR) 50 MG tablet Take 1 tablet (50 mg total) by mouth 2 (two) times daily.   pantoprazole (PROTONIX) 40 MG tablet Take 1 tablet (40 mg total) by mouth daily.   Semaglutide, 2 MG/DOSE, 8 MG/3ML SOPN Inject 2 mg as directed once a week.   tamsulosin (FLOMAX) 0.4 MG CAPS capsule Take 1 capsule (0.4 mg total) by mouth daily.   traZODone (DESYREL) 50 MG tablet Take 0.5-1 tablets (25-50 mg total) by mouth at bedtime as needed for sleep. (Patient not taking: Reported on 04/23/2023)   [DISCONTINUED] acetaminophen (TYLENOL) 500 MG tablet Take by mouth.   No facility-administered medications prior to visit.    Review of Systems  Neurological:  Positive for headaches.       Objective    BP (!) 151/81 (BP Location: Left Arm, Patient Position: Sitting, Cuff Size: Large)   Pulse 69   Temp 97.7 F (36.5 C) (Temporal)   Resp 16   Ht 6' (1.829 m)   Wt 196 lb 12.8 oz (89.3 kg)   SpO2 97%   BMI 26.69 kg/m  BP Readings from Last 3 Encounters:  04/23/23 (!) 151/81  04/05/23 94/66  03/22/23 130/80   Wt Readings from Last 3 Encounters:  04/23/23 196 lb 12.8 oz (89.3 kg)  04/05/23 189 lb (85.7 kg)  03/22/23 192 lb 14.4 oz (87.5 kg)      Physical Exam Vitals reviewed.  Constitutional:      General: He is not in acute distress.    Appearance: Normal appearance. He is not diaphoretic.  HENT:     Head: Normocephalic and atraumatic.  Eyes:     General: No scleral icterus.    Conjunctiva/sclera: Conjunctivae normal.  Cardiovascular:     Rate and Rhythm: Normal rate and regular rhythm.     Heart sounds: Normal heart sounds. No murmur heard. Pulmonary:     Effort: Pulmonary  effort is normal. No respiratory distress.     Breath sounds: Normal breath sounds. No wheezing or rhonchi.  Musculoskeletal:     Cervical back: Neck supple.     Right lower leg: No edema.     Left lower leg: No edema.  Lymphadenopathy:     Cervical: No cervical adenopathy.  Skin:    General: Skin is warm and dry.     Findings: No rash.  Neurological:     Mental Status: He is alert and oriented to person, place, and time. Mental status is at baseline.  Psychiatric:        Mood and Affect: Mood normal.        Behavior: Behavior normal.  No results found for any visits on 04/23/23.  Assessment & Plan     Problem List Items Addressed This Visit       Cardiovascular and Mediastinum   Hypertension associated with diabetes (HCC) - Primary    Blood pressure elevated (140s to 150s over high 80s to mid 90s) after discontinuation of amlodipine and hydrochlorothiazide due to episodes of dizziness and dehydration. No current edema noted. -Resume amlodipine 5mg  daily. - continue lisinopril and metoprolol -Continue monitoring blood pressure at home and report if persistently high or low.      Relevant Medications   amLODipine (NORVASC) 5 MG tablet        Diabetes: Blood glucose levels improved (120s) with dietary changes. -Continue current management and dietary modifications.  Follow-up in August with blood pressure readings.        Return for as scheduled, BP f/u.      I, [name], MD, have reviewed all documentation for this visit. The documentation on 04/23/23 for the exam, diagnosis, procedures, and orders are all accurate and complete.   Ardys Hataway, Marzella Schlein, MD, MPH [facility]

## 2023-04-27 ENCOUNTER — Ambulatory Visit: Payer: Self-pay | Admitting: *Deleted

## 2023-04-27 NOTE — Telephone Encounter (Signed)
Summary: Bp concerns   Pt daughter Maris Berger reports that pt has been complaining of being fatigued and pt blood pressure readings have been elevated. Per [name] pt bp reading for this week: Tuesday- 158/98, Wednesday- 155/94, Thursday- 150/94 and today- 131/95. [name] requests call back to discuss. Cb# [phone]       Chief Complaint: elevated BP per daughter on DPR Symptoms: headaches, fatigue, not feeling like doing anything per patient daughter. BP today 131/95.  Tuesday BP 158/98 wed. BP 155/94 thurs. 150/94 Frequency: last week  Pertinent Negatives: Patient denies chest pain no difficulty breathing. No dizziness no headache, no blurred vision no weakness of either side of body. Disposition: [] ED /[] Urgent Care (no appt availability in office) / [x] Appointment(In office/virtual)/ []  [place] [facility]/ [] Home Care/ [] Refused Recommended Disposition /[] [place] [facility]/ []  Follow-up with PCP Additional Notes:   No available appt with PCP within 2 weeks. Scheduled appt 05/01/23 with provider in practice.      Reason for Disposition  [1] Systolic BP  >= 130 OR Diastolic >= 80 AND [2] taking BP medications  Answer Assessment - Initial Assessment Questions 1. BLOOD PRESSURE: "What is the blood pressure?" "Did you take at least two measurements 5 minutes apart?"     Last today 131/95 per patient daughter on DPR 2. ONSET: "When did you take your blood pressure?"     Today per daughter  3. HOW: "How did you take your blood pressure?" (e.g., automatic home BP monitor, visiting nurse)     Automatic BP monitor at home 4. HISTORY: "Do you have a history of high blood pressure?"     Yes  5. MEDICINES: "Are you taking any medicines for blood pressure?" "Have you missed any doses recently?"     Yes just recently had increased 6. OTHER SYMPTOMS: "Do you have any symptoms?" (e.g., blurred vision, chest pain, difficulty breathing, headache, weakness)     Has had headaches  feeling fatigue , not feeling like doing anything.  7. PREGNANCY: "Is there any chance you are pregnant?" "When was your last menstrual period?"     na  Protocols used: Blood Pressure - High-A-AH

## 2023-04-30 NOTE — Telephone Encounter (Signed)
Continue current meds. Add back hydrochlorothiazide 12.5 mg daily - CMAs please send 30 day supply with 1 refill. Continue to monitor BPs

## 2023-04-30 NOTE — Telephone Encounter (Signed)
[name] advised as below. She requested to cancel appt. They will restart medication. No refill needed. Advised to call back if BP are still elevated.

## 2023-05-01 ENCOUNTER — Ambulatory Visit: Payer: No Typology Code available for payment source | Admitting: Physician Assistant

## 2023-05-10 ENCOUNTER — Other Ambulatory Visit: Payer: Self-pay | Admitting: Physician Assistant

## 2023-05-11 ENCOUNTER — Telehealth: Payer: Self-pay | Admitting: Family Medicine

## 2023-05-11 NOTE — Telephone Encounter (Signed)
Pt's daughter Aggie Cosier is calling in because pt's medication refill for HYDROcodone-acetaminophen (NORCO/VICODIN) 5-325 MG tablet was denied and she wants to know was the reasoning because it was too early to fill or is it something else. Pt says the pharmacy said it could be filled on Saturday but it was too early to fill it right now.

## 2023-05-15 MED ORDER — HYDROCODONE-ACETAMINOPHEN 5-325 MG PO TABS: 1.0000 | ORAL_TABLET | Freq: Four times a day (QID) | ORAL | 0 refills | Status: DC | PRN

## 2023-05-15 NOTE — Telephone Encounter (Signed)
I don't know how/why it was denied. I sent a refill. Last refill date 6/20, so he should be able to refill today.

## 2023-05-16 NOTE — Telephone Encounter (Signed)
PT'S daughter called back stated they were able to pick up refill yesterday.  If anything else is needed to call back.

## 2023-06-01 ENCOUNTER — Other Ambulatory Visit: Payer: Self-pay | Admitting: Family Medicine

## 2023-06-05 ENCOUNTER — Encounter: Payer: Self-pay | Admitting: *Deleted

## 2023-06-11 ENCOUNTER — Other Ambulatory Visit: Payer: Self-pay | Admitting: Family Medicine

## 2023-06-22 ENCOUNTER — Ambulatory Visit: Payer: No Typology Code available for payment source | Admitting: Family Medicine

## 2023-06-26 ENCOUNTER — Ambulatory Visit: Payer: No Typology Code available for payment source | Admitting: Family Medicine

## 2023-06-26 ENCOUNTER — Encounter: Payer: Self-pay | Admitting: Family Medicine

## 2023-06-26 VITALS — BP 146/76 | HR 73 | Temp 98.0°F | Resp 12 | Ht 72.0 in | Wt 193.7 lb

## 2023-06-26 DIAGNOSIS — G4700 Insomnia, unspecified: Secondary | ICD-10-CM | POA: Diagnosis not present

## 2023-06-26 DIAGNOSIS — N182 Chronic kidney disease, stage 2 (mild): Secondary | ICD-10-CM | POA: Diagnosis not present

## 2023-06-26 DIAGNOSIS — E785 Hyperlipidemia, unspecified: Secondary | ICD-10-CM

## 2023-06-26 DIAGNOSIS — Z23 Encounter for immunization: Secondary | ICD-10-CM | POA: Diagnosis not present

## 2023-06-26 DIAGNOSIS — Z7984 Long term (current) use of oral hypoglycemic drugs: Secondary | ICD-10-CM | POA: Diagnosis not present

## 2023-06-26 DIAGNOSIS — E1169 Type 2 diabetes mellitus with other specified complication: Secondary | ICD-10-CM

## 2023-06-26 DIAGNOSIS — E1159 Type 2 diabetes mellitus with other circulatory complications: Secondary | ICD-10-CM | POA: Diagnosis not present

## 2023-06-26 DIAGNOSIS — E1122 Type 2 diabetes mellitus with diabetic chronic kidney disease: Secondary | ICD-10-CM

## 2023-06-26 DIAGNOSIS — I152 Hypertension secondary to endocrine disorders: Secondary | ICD-10-CM | POA: Diagnosis not present

## 2023-06-26 DIAGNOSIS — M47816 Spondylosis without myelopathy or radiculopathy, lumbar region: Secondary | ICD-10-CM | POA: Diagnosis not present

## 2023-06-26 MED ORDER — LISINOPRIL 20 MG PO TABS: 20.0000 mg | ORAL_TABLET | Freq: Every day | ORAL | 0 refills | Status: DC

## 2023-06-26 MED ORDER — LISINOPRIL 40 MG PO TABS: 40.0000 mg | ORAL_TABLET | Freq: Every day | ORAL | 1 refills | Status: DC

## 2023-06-26 NOTE — Assessment & Plan Note (Signed)
Chronic and stable Continue cymbalta and Norco at current dose

## 2023-06-26 NOTE — Assessment & Plan Note (Signed)
Elevated blood pressure readings at home and in clinic. Currently on Amlodipine 5mg  daily and Lisinopril 20mg  daily. -Increase Lisinopril to 40mg  daily.

## 2023-06-26 NOTE — Assessment & Plan Note (Signed)
Patient reports improved blood glucose readings at home. Currently on Ozempic 2mg  weekly, Metformin 1000mg  twice daily, and Jardiance 25mg  daily. -Continue current regimen. -Order A1C, cholesterol, kidney and liver function tests.

## 2023-06-26 NOTE — Progress Notes (Signed)
Established Patient Office Visit  Subjective   Patient ID: [name], male    DOB: [birth year]  Age: 72 y.o. MRN: [id number]  Chief Complaint  Patient presents with   Medical Management of Chronic Issues    HPI  Discussed the use of AI scribe software for clinical note transcription with the patient, who gave verbal consent to proceed.  History of Present Illness   The patient, with a history of hypertension, diabetes, and sleep disturbances, presents with concerns about high blood pressure readings at home. The patient reports that despite taking amlodipine and lisinopril, the blood pressure readings have been consistently high. The patient also mentions having sleep disturbances, stating that trazodone, even at higher doses, has not been effective. The patient is also on metoprolol for hypertension, Lipitor and Zetia for cholesterol management, and Ozempic, Metformin, and Jardiance for diabetes management. The patient's blood sugar levels have been improving, as indicated by a morning reading of 132. The patient is active and maintains a regular routine, including taking care of the lawn.         ROS per HPI    Objective:     BP (!) 146/76 (BP Location: Left Arm, Cuff Size: Large)   Pulse 73   Temp 98 F (36.7 C) (Temporal)   Resp 12   Ht 6' (1.829 m)   Wt 193 lb 11.2 oz (87.9 kg)   SpO2 99%   BMI 26.27 kg/m    Physical Exam Vitals reviewed.  Constitutional:      General: He is not in acute distress.    Appearance: Normal appearance. He is not diaphoretic.  HENT:     Head: Normocephalic and atraumatic.  Eyes:     General: No scleral icterus.    Conjunctiva/sclera: Conjunctivae normal.  Cardiovascular:     Rate and Rhythm: Normal rate and regular rhythm.     Heart sounds: Normal heart sounds. No murmur heard. Pulmonary:     Effort: Pulmonary effort is normal. No respiratory distress.     Breath sounds: Normal breath sounds. No wheezing or rhonchi.   Musculoskeletal:     Cervical back: Neck supple.     Right lower leg: No edema.     Left lower leg: No edema.  Lymphadenopathy:     Cervical: No cervical adenopathy.  Skin:    General: Skin is warm and dry.     Findings: No rash.  Neurological:     Mental Status: He is alert and oriented to person, place, and time. Mental status is at baseline.  Psychiatric:        Mood and Affect: Mood normal.        Behavior: Behavior normal.      No results found for any visits on 06/26/23.    The 10-year ASCVD risk score (Arnett DK, et al., 2019) is: 53.9%    Assessment & Plan:   Problem List Items Addressed This Visit       Cardiovascular and Mediastinum   Hypertension associated with diabetes (HCC) - Primary    Elevated blood pressure readings at home and in clinic. Currently on Amlodipine 5mg  daily and Lisinopril 20mg  daily. -Increase Lisinopril to 40mg  daily.      Relevant Medications   lisinopril (ZESTRIL) 40 MG tablet   lisinopril (ZESTRIL) 20 MG tablet   Other Relevant Orders   Comprehensive metabolic panel     Endocrine   Hyperlipidemia associated with type 2 diabetes mellitus (HCC)    Previously well  controlled Continue statin Repeat FLP and CMP Goal LDL < 70       Relevant Medications   lisinopril (ZESTRIL) 40 MG tablet   lisinopril (ZESTRIL) 20 MG tablet   Other Relevant Orders   Comprehensive metabolic panel   Lipid panel   T2DM (type 2 diabetes mellitus) (HCC)    Patient reports improved blood glucose readings at home. Currently on Ozempic 2mg  weekly, Metformin 1000mg  twice daily, and Jardiance 25mg  daily. -Continue current regimen. -Order A1C, cholesterol, kidney and liver function tests.      Relevant Medications   lisinopril (ZESTRIL) 40 MG tablet   lisinopril (ZESTRIL) 20 MG tablet   Other Relevant Orders   Hemoglobin A1c     Musculoskeletal and Integument   Spondylosis of lumbar region without myelopathy or radiculopathy     Genitourinary    CKD (chronic kidney disease) stage 2, GFR 60-89 ml/min    Chronic and stable Recheck metabolic panel Avoid nephrotoxic meds       Relevant Orders   Comprehensive metabolic panel     Other   Insomnia    Trazodone ineffective even at increased dose. -Discontinue Trazodone. -Start Melatonin 5mg  at bedtime. -Advise to avoid screens for an hour before bedtime and maintain a dark, cool, and quiet sleep environment.           General Health Maintenance -Administered flu shot today. -Advise patient to get Tetanus shot at pharmacy, as it has been 10 years since the last one. -Schedule physical for next visit.        Return in about 3 months (around 09/25/2023) for CPE.    [name], MD

## 2023-06-26 NOTE — Assessment & Plan Note (Signed)
Previously well controlled Continue statin Repeat FLP and CMP Goal LDL < 70 

## 2023-06-26 NOTE — Assessment & Plan Note (Signed)
Chronic and stable Recheck metabolic panel Avoid nephrotoxic meds  

## 2023-06-26 NOTE — Assessment & Plan Note (Signed)
Trazodone ineffective even at increased dose. -Discontinue Trazodone. -Start Melatonin 5mg  at bedtime. -Advise to avoid screens for an hour before bedtime and maintain a dark, cool, and quiet sleep environment.

## 2023-06-27 LAB — COMPREHENSIVE METABOLIC PANEL
ALT: 19 IU/L (ref 0–44)
AST: 17 IU/L (ref 0–40)
Albumin: 4.6 g/dL (ref 3.8–4.8)
Alkaline Phosphatase: 44 IU/L (ref 44–121)
BUN/Creatinine Ratio: 10 (ref 10–24)
BUN: 11 mg/dL (ref 8–27)
Bilirubin Total: 0.5 mg/dL (ref 0.0–1.2)
CO2: 22 mmol/L (ref 20–29)
Calcium: 9.8 mg/dL (ref 8.6–10.2)
Chloride: 104 mmol/L (ref 96–106)
Creatinine, Ser: 1.06 mg/dL (ref 0.76–1.27)
Globulin, Total: 2.3 g/dL (ref 1.5–4.5)
Glucose: 133 mg/dL — ABNORMAL HIGH (ref 70–99)
Potassium: 4.2 mmol/L (ref 3.5–5.2)
Sodium: 139 mmol/L (ref 134–144)
Total Protein: 6.9 g/dL (ref 6.0–8.5)
eGFR: 74 mL/min/{1.73_m2} (ref >59)

## 2023-06-27 LAB — LIPID PANEL
Chol/HDL Ratio: 3.1 ratio (ref 0.0–5.0)
Cholesterol, Total: 97 mg/dL — ABNORMAL LOW (ref 100–199)
HDL: 31 mg/dL — ABNORMAL LOW (ref >39)
LDL Chol Calc (NIH): 48 mg/dL (ref 0–99)
Triglycerides: 93 mg/dL (ref 0–149)
VLDL Cholesterol Cal: 18 mg/dL (ref 5–40)

## 2023-06-27 LAB — HEMOGLOBIN A1C
Est. average glucose Bld gHb Est-mCnc: 151 mg/dL
Hgb A1c MFr Bld: 6.9 % — ABNORMAL HIGH (ref 4.8–5.6)

## 2023-06-28 ENCOUNTER — Encounter: Payer: Self-pay | Admitting: Family Medicine

## 2023-07-06 ENCOUNTER — Ambulatory Visit: Payer: Self-pay | Admitting: *Deleted

## 2023-07-06 NOTE — Telephone Encounter (Signed)
Patient daughter advised. Verbalized understanding. Also requested link to be sent to activate mychart account

## 2023-07-06 NOTE — Telephone Encounter (Signed)
Increase amlodipine to 10mg  daily as well. Continue current lisinopril dose. Make follow up appt in 2-3 weeks for recheck

## 2023-07-06 NOTE — Telephone Encounter (Signed)
  Chief Complaint: BP remains elevated after increase in Lisinopril to 40 mg from 20 mg on 06/26/2023 when seen by Dr. Beryle Flock.    Symptoms: Still having headaches Frequency: daily  Pertinent Negatives: Patient denies that the medicine has helped lower his BP any. Disposition: [] ED /[] Urgent Care (no appt availability in office) / [] Appointment(In office/virtual)/ []  [facility]/ [] Home Care/ [] Refused Recommended Disposition /[]  Mobile Bus/ [x]  Follow-up with PCP Additional Notes: Message sent to Dr. Beryle Flock.   Please call daughter, Aggie Cosier (on [place]) with any further instructions at [phone].

## 2023-07-06 NOTE — Telephone Encounter (Signed)
Message from Hanson sent at 07/06/2023  9:22 AM EDT  Summary: high BP + headaches   Patient's daughter Aggie Cosier has called and stated Dr B just recently increased patients BP medication and his BP is still saying high, she said the latest readings are:159/90 and 142/95 Also daughter stated patient is having headaches.    [name]'s callback # *she is on the DPR* # [phone]          Call History  Contact Date/Time Type Contact Phone/Fax User  07/06/2023 09:19 AM EDT Phone (Incoming) Edwards,[name] (Emergency Contact) [phone] Rexene Edison) Muck, Army Melia   Reason for Disposition  [1] Systolic BP  >= 130 OR Diastolic >= 80 AND [2] taking BP medications    On 06/26/2023 Lisinopril was increased to 40 mg from 20 mg.   BP still elevated and he is having headaches.  Answer Assessment - Initial Assessment Questions 1. BLOOD PRESSURE: "What is the blood pressure?" "Did you take at least two measurements 5 minutes apart?"    I returned call to daughter, Aggie Cosier who is on his DPR.   His Bp was 159/90 day before yesterday.    142/95 yesterday and he is also keeping a headache.   Dr. Beryle Flock increased the lisinopril to 40 mg because his BP was elevated.  She is not with her father this morning so has not checked his BP today. 2. ONSET: "When did you take your blood pressure?"     The 142/95 was yesterday.    See above 3. HOW: "How did you take your blood pressure?" (e.g., automatic home BP monitor, visiting nurse)     Home BP monitor 4. HISTORY: "Do you have a history of high blood pressure?"     Yes 5. MEDICINES: "Are you taking any medicines for blood pressure?" "Have you missed any doses recently?"     Yes.   Lisinopril was increased from 20 mg to 40 mg on 06/26/2023. 6. OTHER SYMPTOMS: "Do you have any symptoms?" (e.g., blurred vision, chest pain, difficulty breathing, headache, weakness)     Having headaches. 7. PREGNANCY: "Is there any chance you are pregnant?" "When was your last  menstrual period?"     N/A  Protocols used: Blood Pressure - High-A-AH

## 2023-07-09 ENCOUNTER — Other Ambulatory Visit: Payer: Self-pay | Admitting: Family Medicine

## 2023-07-09 DIAGNOSIS — E1165 Type 2 diabetes mellitus with hyperglycemia: Secondary | ICD-10-CM

## 2023-07-09 NOTE — Telephone Encounter (Signed)
Medication Refill - Medication: Semaglutide, 2 MG/DOSE, 8 MG/3ML SOPN   Has the patient contacted their pharmacy? Yes.     Preferred Pharmacy (with phone number or street name):  [facility] - [place], [place] - [address] Phone: [phone]  Fax: [phone]      Has the patient been seen for an appointment in the last year OR does the patient have an upcoming appointment? Yes.    Please assist patient further

## 2023-07-10 ENCOUNTER — Other Ambulatory Visit: Payer: Self-pay | Admitting: Family Medicine

## 2023-07-10 DIAGNOSIS — E1165 Type 2 diabetes mellitus with hyperglycemia: Secondary | ICD-10-CM

## 2023-07-10 MED ORDER — SEMAGLUTIDE (2 MG/DOSE) 8 MG/3ML ~~LOC~~ SOPN
2.0000 mg | PEN_INJECTOR | SUBCUTANEOUS | 3 refills | Status: DC
Start: 2023-07-10 — End: 2023-10-12

## 2023-07-10 NOTE — Telephone Encounter (Signed)
Requested Prescriptions  Pending Prescriptions Disp Refills   Semaglutide, 2 MG/DOSE, 8 MG/3ML SOPN 3 mL 3    Sig: Inject 2 mg as directed once a week.     Endocrinology:  Diabetes - GLP-1 Receptor Agonists - semaglutide Failed - 07/09/2023  5:12 PM      Failed - HBA1C in normal range and within 180 days    Hemoglobin A1C  Date Value Ref Range Status  07/18/2021 8.1  Final   Hgb A1c MFr Bld  Date Value Ref Range Status  06/26/2023 6.9 (H) 4.8 - 5.6 % Final    Comment:             Prediabetes: 5.7 - 6.4          Diabetes: >6.4          Glycemic control for adults with diabetes: <7.0          Passed - Cr in normal range and within 360 days    Creatinine, Ser  Date Value Ref Range Status  06/26/2023 1.06 0.76 - 1.27 mg/dL Final   Creatinine, Urine  Date Value Ref Range Status  03/01/2023 110  Final         Passed - Valid encounter within last 6 months    Recent Outpatient Visits           2 weeks ago Hypertension associated with diabetes [facility])   [place] [facility] [place], Marzella Schlein, MD   2 months ago Hypertension associated with diabetes [facility])   [place] [facility] [place], Marzella Schlein, MD   3 months ago Dizziness   [place] [facility] Merita Norton T, FNP   3 months ago Type 2 diabetes mellitus with hyperglycemia, without long-term current use of insulin [facility])   [place] [facility] [place], Marzella Schlein, MD   6 months ago Type 2 diabetes mellitus with hyperglycemia, without long-term current use of insulin [facility])   [place] [facility] [place], Marzella Schlein, MD       Future Appointments             In 2 months Bacigalupo, Marzella Schlein, MD [facility], [facility]

## 2023-07-14 ENCOUNTER — Other Ambulatory Visit: Payer: Self-pay | Admitting: Family Medicine

## 2023-07-17 NOTE — Telephone Encounter (Signed)
Requested medications are due for refill today.  yes  Requested medications are on the active medications list.  yes  Last refill. 06/12/2023 #60 0 rf  Future visit scheduled.   yes  Notes to clinic.  Refill not delegated.    Requested Prescriptions  Pending Prescriptions Disp Refills   HYDROcodone-acetaminophen (NORCO/VICODIN) 5-325 MG tablet [Pharmacy Med Name: HYDROCODONE-APAP 5-325 MG TAB] 60 tablet     Sig: TAKE 1 TABLET BY MOUTH EVERY 6 HOURS AS NEEDED FOR MODERATE PAIN     Not Delegated - Analgesics:  Opioid Agonist Combinations Failed - 07/14/2023 12:48 PM      Failed - This refill cannot be delegated      Failed - Urine Drug Screen completed in last 360 days      Passed - Valid encounter within last 3 months    Recent Outpatient Visits           3 weeks ago Hypertension associated with diabetes [facility])   [place] [facility] [place], Marzella Schlein, MD   2 months ago Hypertension associated with diabetes [facility])   [place] [facility] [place], Marzella Schlein, MD   3 months ago Dizziness   [place] [facility] Merita Norton T, FNP   3 months ago Type 2 diabetes mellitus with hyperglycemia, without long-term current use of insulin [facility])   [place] [facility] [place], Marzella Schlein, MD   7 months ago Type 2 diabetes mellitus with hyperglycemia, without long-term current use of insulin [facility])   [place] [facility] [place], Marzella Schlein, MD       Future Appointments             In 2 months Bacigalupo, Marzella Schlein, MD [facility], [facility]

## 2023-07-27 ENCOUNTER — Ambulatory Visit: Payer: No Typology Code available for payment source | Admitting: Podiatry

## 2023-08-14 ENCOUNTER — Other Ambulatory Visit: Payer: Self-pay | Admitting: Family Medicine

## 2023-08-14 DIAGNOSIS — M47816 Spondylosis without myelopathy or radiculopathy, lumbar region: Secondary | ICD-10-CM

## 2023-09-11 ENCOUNTER — Other Ambulatory Visit: Payer: Self-pay | Admitting: Family Medicine

## 2023-09-11 DIAGNOSIS — M47816 Spondylosis without myelopathy or radiculopathy, lumbar region: Secondary | ICD-10-CM

## 2023-09-12 ENCOUNTER — Other Ambulatory Visit: Payer: Self-pay | Admitting: Family Medicine

## 2023-09-12 DIAGNOSIS — M47816 Spondylosis without myelopathy or radiculopathy, lumbar region: Secondary | ICD-10-CM

## 2023-09-12 NOTE — Telephone Encounter (Signed)
Medication Refill -  Most Recent Primary Care Visit:  Provider: Erasmo Downer  Department: [facility]  Visit Type: OFFICE VISIT  Date: 06/26/2023  Medication:  HYDROcodone-acetaminophen (NORCO/VICODIN) 5-325 MG tablet    Has the patient contacted their pharmacy? No  Is this the correct pharmacy for this prescription? Yes   This is the patient's preferred pharmacy: [facility] - [place], [place] - [address] [place] [address] Phone: [phone] Fax: [phone]  Has the prescription been filled recently? Yes  Is the patient out of the medication? Yes  Has the patient been seen for an appointment in the last year OR does the patient have an upcoming appointment? Yes  Can we respond through MyChart? No  Agent: Please be advised that Rx refills may take up to 3 business days. We ask that you follow-up with your pharmacy.

## 2023-09-13 NOTE — Telephone Encounter (Signed)
Requested medication (s) are due for refill today: no   Requested medication (s) are on the active medication list: yes  Last refill:  09/12/23 #60  Future visit scheduled: yes  Notes to clinic:  NT not delegated to refuse this med   Requested Prescriptions  Pending Prescriptions Disp Refills   HYDROcodone-acetaminophen (NORCO/VICODIN) 5-325 MG tablet 60 tablet 0    Sig: Take 1 tablet by mouth every 6 (six) hours as needed for moderate pain (pain score 4-6).     Not Delegated - Analgesics:  Opioid Agonist Combinations Failed - 09/12/2023  3:20 PM      Failed - This refill cannot be delegated      Failed - Urine Drug Screen completed in last 360 days      Passed - Valid encounter within last 3 months    Recent Outpatient Visits           2 months ago Hypertension associated with diabetes [facility])   [place] [facility] [place], Marzella Schlein, MD   4 months ago Hypertension associated with diabetes [facility])   [place] [facility] [place], Marzella Schlein, MD   5 months ago Dizziness   [place] [facility] Merita Norton T, FNP   5 months ago Type 2 diabetes mellitus with hyperglycemia, without long-term current use of insulin [facility])   [facility] [place], Marzella Schlein, MD   8 months ago Type 2 diabetes mellitus with hyperglycemia, without long-term current use of insulin [facility])   [place] [facility] [place], Marzella Schlein, MD       Future Appointments             In 3 weeks Bacigalupo, Marzella Schlein, MD [facility], [facility]

## 2023-09-14 MED ORDER — HYDROCODONE-ACETAMINOPHEN 5-325 MG PO TABS: 1.0000 | ORAL_TABLET | Freq: Four times a day (QID) | ORAL | 0 refills | Status: DC | PRN

## 2023-09-16 IMAGING — DX DG WRIST 2V*R*
2 series · 2 of 2 positions shown · non-contrast
Comparison: None

CLINICAL DATA: Postop

EXAM:
RIGHT WRIST - 2 VIEW

[wrist ap]
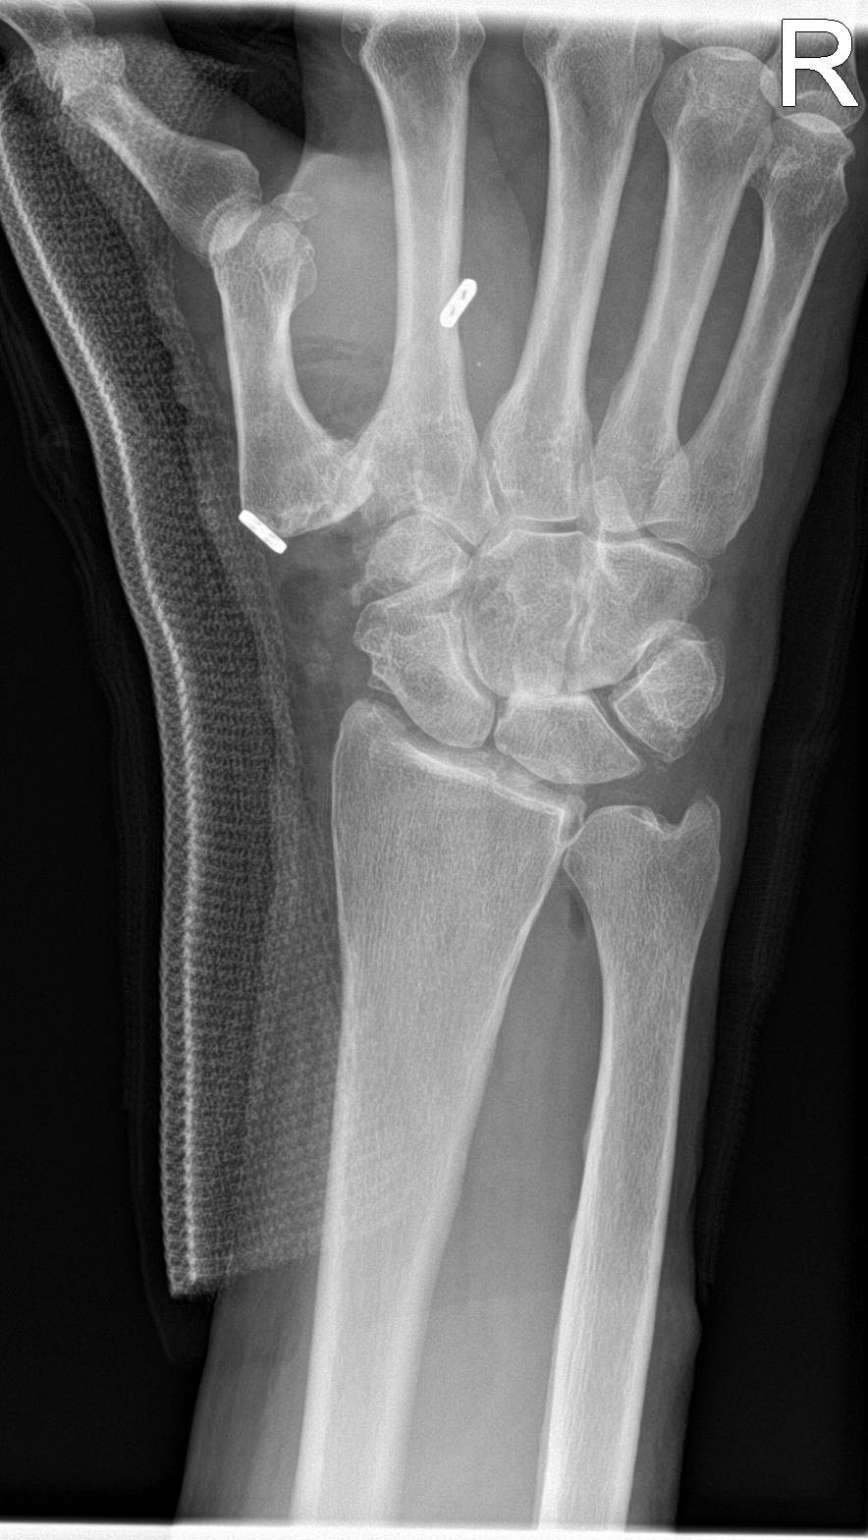

[wrist lat]
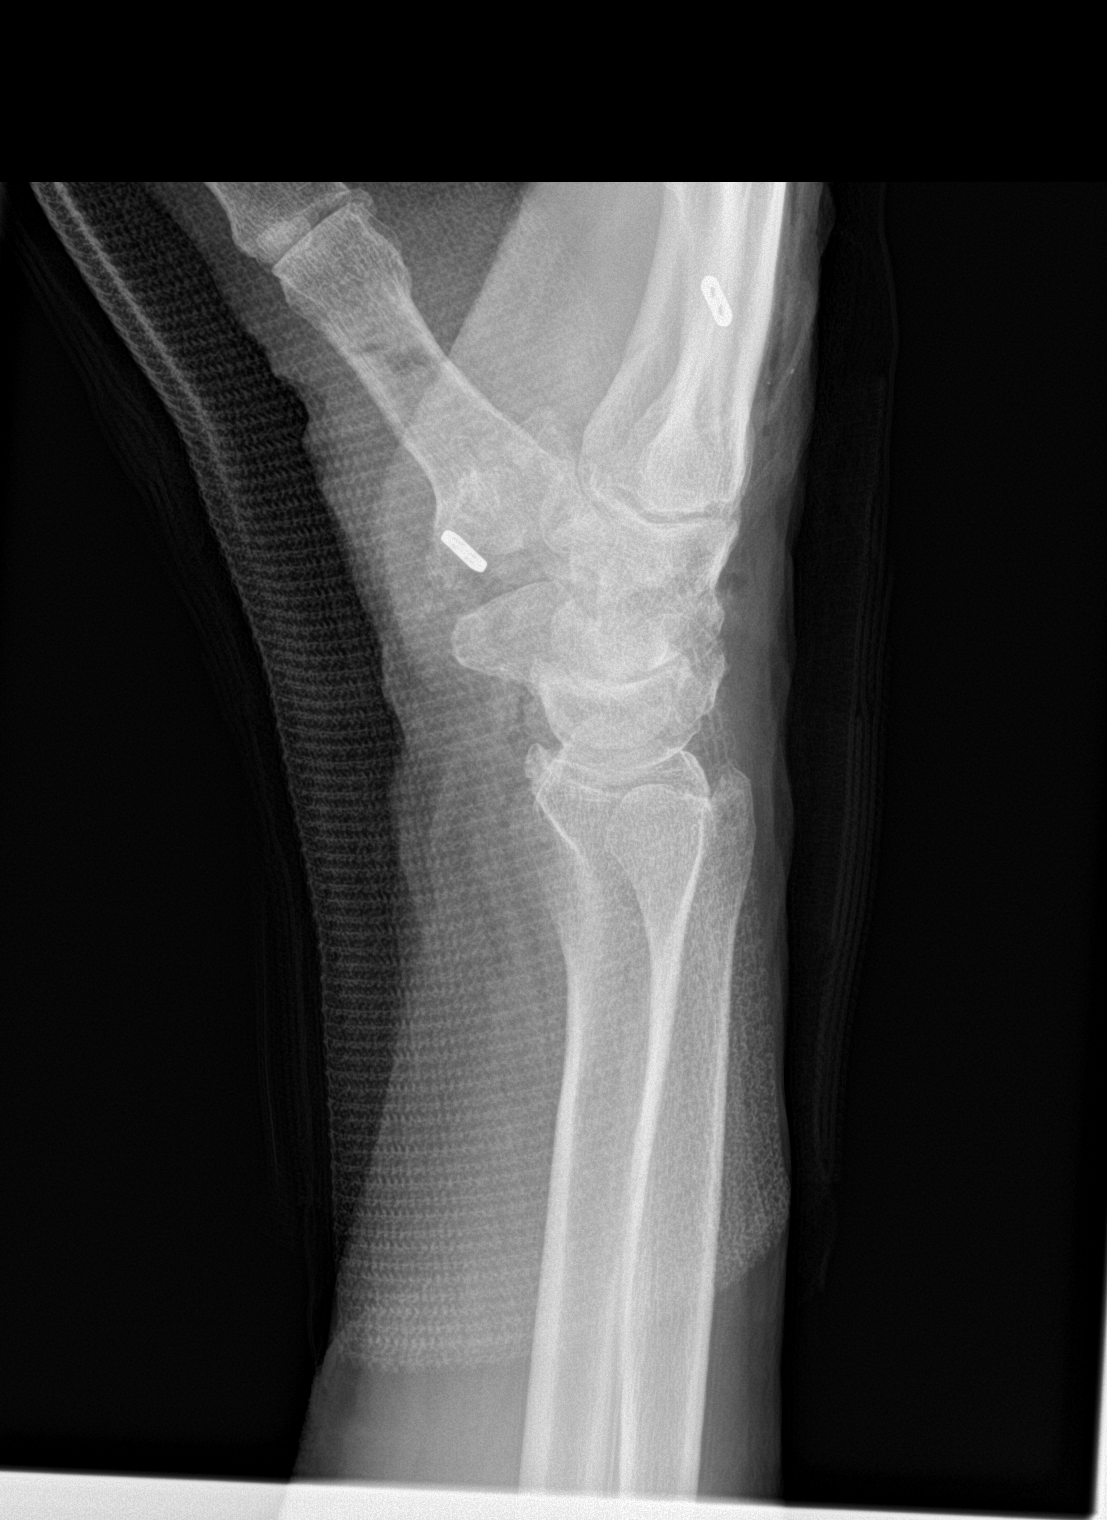

[2 of 2 positions shown; findings below may reference images not displayed]

FINDINGS: Fiberglass splint obscures bone detail.

Post resection of trapezium.

Narrowing of midcarpal joint.

No fracture or dislocation.

Suture anchors at the bases of the first and second metacarpals.
IMPRESSION: Postsurgical changes at radial margin of RIGHT carpus.

## 2023-10-04 DIAGNOSIS — E785 Hyperlipidemia, unspecified: Secondary | ICD-10-CM | POA: Diagnosis not present

## 2023-10-04 DIAGNOSIS — N1832 Chronic kidney disease, stage 3b: Secondary | ICD-10-CM | POA: Diagnosis not present

## 2023-10-04 DIAGNOSIS — I2510 Atherosclerotic heart disease of native coronary artery without angina pectoris: Secondary | ICD-10-CM | POA: Diagnosis not present

## 2023-10-04 DIAGNOSIS — Z955 Presence of coronary angioplasty implant and graft: Secondary | ICD-10-CM | POA: Diagnosis not present

## 2023-10-04 DIAGNOSIS — I10 Essential (primary) hypertension: Secondary | ICD-10-CM | POA: Diagnosis not present

## 2023-10-04 DIAGNOSIS — Z9889 Other specified postprocedural states: Secondary | ICD-10-CM | POA: Diagnosis not present

## 2023-10-05 ENCOUNTER — Encounter: Payer: No Typology Code available for payment source | Admitting: Family Medicine

## 2023-10-08 ENCOUNTER — Ambulatory Visit (INDEPENDENT_AMBULATORY_CARE_PROVIDER_SITE_OTHER): Payer: No Typology Code available for payment source | Admitting: Family Medicine

## 2023-10-08 ENCOUNTER — Encounter: Payer: Self-pay | Admitting: Family Medicine

## 2023-10-08 VITALS — BP 150/80 | HR 77 | Ht 72.0 in | Wt 195.8 lb

## 2023-10-08 DIAGNOSIS — Z66 Do not resuscitate: Secondary | ICD-10-CM | POA: Diagnosis not present

## 2023-10-08 DIAGNOSIS — E1169 Type 2 diabetes mellitus with other specified complication: Secondary | ICD-10-CM

## 2023-10-08 DIAGNOSIS — M47816 Spondylosis without myelopathy or radiculopathy, lumbar region: Secondary | ICD-10-CM | POA: Diagnosis not present

## 2023-10-08 DIAGNOSIS — E1159 Type 2 diabetes mellitus with other circulatory complications: Secondary | ICD-10-CM

## 2023-10-08 DIAGNOSIS — N182 Chronic kidney disease, stage 2 (mild): Secondary | ICD-10-CM | POA: Diagnosis not present

## 2023-10-08 DIAGNOSIS — E785 Hyperlipidemia, unspecified: Secondary | ICD-10-CM

## 2023-10-08 DIAGNOSIS — F1190 Opioid use, unspecified, uncomplicated: Secondary | ICD-10-CM

## 2023-10-08 DIAGNOSIS — E1122 Type 2 diabetes mellitus with diabetic chronic kidney disease: Secondary | ICD-10-CM | POA: Diagnosis not present

## 2023-10-08 DIAGNOSIS — G4700 Insomnia, unspecified: Secondary | ICD-10-CM | POA: Diagnosis not present

## 2023-10-08 DIAGNOSIS — Z0000 Encounter for general adult medical examination without abnormal findings: Secondary | ICD-10-CM

## 2023-10-08 DIAGNOSIS — I152 Hypertension secondary to endocrine disorders: Secondary | ICD-10-CM

## 2023-10-08 DIAGNOSIS — Z7984 Long term (current) use of oral hypoglycemic drugs: Secondary | ICD-10-CM

## 2023-10-08 MED ORDER — HYDROCODONE-ACETAMINOPHEN 5-325 MG PO TABS: 1.0000 | ORAL_TABLET | Freq: Four times a day (QID) | ORAL | 0 refills | Status: DC | PRN

## 2023-10-08 MED ORDER — AMLODIPINE BESYLATE 10 MG PO TABS: 10.0000 mg | ORAL_TABLET | Freq: Every day | ORAL | 1 refills | Status: DC

## 2023-10-08 NOTE — Assessment & Plan Note (Signed)
Hypertension remains suboptimally controlled with current regimen. Blood pressure readings are elevated in the morning, likely due to poor sleep. Current medications include lisinopril 40 mg daily, amlodipine 5 mg daily, and metoprolol 50 mg twice daily. Discussed the potential benefit of increasing amlodipine to better control blood pressure. Emphasized the importance of adequate rest in managing hypertension. Discussed the risks of uncontrolled hypertension, including stroke and myocardial infarction, and the benefits of better blood pressure control. - Increase amlodipine to 10 mg daily - Recheck blood pressure at next visit

## 2023-10-08 NOTE — Assessment & Plan Note (Signed)
Hyperlipidemia managed with atorvastatin 80 mg daily and Zetia 10 mg daily. Recent cholesterol levels are up to date. No immediate action required. - No immediate action required

## 2023-10-08 NOTE — Assessment & Plan Note (Signed)
Chronic and stable Continue cymbalta and Norco at current dose UDS today

## 2023-10-08 NOTE — Progress Notes (Signed)
Annual Wellness Visit     Patient: [name], Male    DOB: 07-01-1951, 72 y.o.   MRN: [id number] Visit Date: 10/08/2023  Today's Provider: [name], MD   Chief Complaint  Patient presents with   Annual Exam    Patient reports consuming a low salt diet. He goes walking when the weather allows. He reports feeling fairly well and sleeping poorly. He reports no other conerns   Subjective    [name] is a 72 y.o. male who presents today for his Annual Wellness Visit.   Discussed the use of AI scribe software for clinical note transcription with the patient, who gave verbal consent to proceed.  History of Present Illness   The patient, a man with a history of hypertension, diabetes, arthritis, and kidney issues, presents with high blood pressure and sleep disturbances. He reports that his blood pressure is often high in the mornings, but tends to normalize by dinner time. He attributes this fluctuation to his poor sleep, stating that he is a light sleeper and often wakes up in the middle of the night. Despite trying sleeping pills, he has not found relief.  In addition to his sleep issues, the patient also reports arthritis pain in his hands, which has been worsening. He describes difficulty with gripping and opening items due to the pain. He also mentions that he has been experiencing some back pain, which he has been managing with over-the-counter medication.  The patient is currently on a regimen of multiple medications, including lisinopril, amlodipine, metformin, Jardiance, Plavix, Flomax, atorvastatin, fenofibrate, pantoprazole, metoprolol, Zetia, and Cymbalta. He reports no issues with these medications, except for a dislike of capsules due to their tendency to come apart.             Medications: Outpatient Medications Prior to Visit  Medication Sig   acetaminophen (TYLENOL) 500 MG tablet Take 1,000 mg by mouth every 6 (six) hours as needed for mild  pain or moderate pain.   aspirin 81 MG tablet Take 81 mg by mouth daily.    atorvastatin (LIPITOR) 80 MG tablet Take 1 tablet (80 mg total) by mouth daily.   blood glucose meter kit and supplies KIT Dispense based on patient and insurance preference. Check sugars once daily   Cholecalciferol (VITAMIN D3 PO) Take 1 tablet by mouth daily.   clopidogrel (PLAVIX) 75 MG tablet Take 1 tablet (75 mg total) by mouth daily.   DULoxetine (CYMBALTA) 20 MG capsule TAKE 1 CAPSULE BY MOUTH DAILY   empagliflozin (JARDIANCE) 25 MG TABS tablet Take 1 tablet (25 mg total) by mouth daily.   ezetimibe (ZETIA) 10 MG tablet Take 1 tablet (10 mg total) by mouth daily.   fenofibrate (TRICOR) 145 MG tablet Take 1 tablet (145 mg total) by mouth daily.   glucose blood (ONETOUCH VERIO) test strip TEST BLOOD GLUCOSE EVERY DAY   HYDROcodone-acetaminophen (NORCO/VICODIN) 5-325 MG tablet Take 1 tablet by mouth every 6 (six) hours as needed for moderate pain (pain score 4-6).   Lidocaine 4 % PTCH Apply 1 patch topically daily as needed (pain).   Liniments (BEN GAY EX) Apply 1 application topically daily as needed (pain).   lisinopril (ZESTRIL) 40 MG tablet Take 1 tablet (40 mg total) by mouth daily.   metFORMIN (GLUCOPHAGE) 1000 MG tablet Take 1 tablet (1,000 mg total) by mouth 2 (two) times daily with a meal.   metoprolol tartrate (LOPRESSOR) 50 MG tablet Take 1 tablet (50 mg  total) by mouth 2 (two) times daily.   pantoprazole (PROTONIX) 40 MG tablet Take 1 tablet (40 mg total) by mouth daily.   Semaglutide, 2 MG/DOSE, 8 MG/3ML SOPN Inject 2 mg as directed once a week.   tamsulosin (FLOMAX) 0.4 MG CAPS capsule Take 1 capsule (0.4 mg total) by mouth daily.   [DISCONTINUED] amLODipine (NORVASC) 5 MG tablet Take 1 tablet (5 mg total) by mouth daily.   [DISCONTINUED] HYDROcodone-acetaminophen (NORCO/VICODIN) 5-325 MG tablet TAKE 1 TABLET BY MOUTH EVERY 6 HOURS AS NEEDED FOR MODERATE PAIN   [DISCONTINUED] lisinopril (ZESTRIL) 20  MG tablet Take 1 tablet (20 mg total) by mouth daily.   No facility-administered medications prior to visit.    Allergies  Allergen Reactions   Amoxicillin     diarrhea and headache Has patient had a PCN reaction causing immediate rash, facial/tongue/throat swelling, SOB or lightheadedness with hypotension: No Has patient had a PCN reaction causing severe rash involving mucus membranes or skin necrosis: No Has patient had a PCN reaction that required hospitalization: No Has patient had a PCN reaction occurring within the last 10 years: Unknown If all of the above answers are "NO", then may proceed with Cephalosporin use.    Fexofenadine     Unknown reaction    Penicillins     Patient Care Team: Erasmo Downer, MD as PCP - General (Family Medicine) Marcina Millard, MD as Consulting Physician (Cardiology) Nevada Crane, MD as Consulting Physician (Ophthalmology) Leanora Ivanoff, PA-C  Review of Systems       Objective    Vitals: BP (!) 150/80 (BP Location: Left Arm, Patient Position: Sitting, Cuff Size: Normal)   Pulse 77   Ht 6' (1.829 m)   Wt 195 lb 12.8 oz (88.8 kg)   SpO2 98%   BMI 26.56 kg/m      Physical Exam Vitals reviewed.  Constitutional:      General: He is not in acute distress.    Appearance: Normal appearance. He is well-developed. He is not diaphoretic.  HENT:     Head: Normocephalic and atraumatic.     Right Ear: Tympanic membrane, ear canal and external ear normal.     Left Ear: Tympanic membrane, ear canal and external ear normal.     Nose: Nose normal.     Mouth/Throat:     Mouth: Mucous membranes are moist.     Pharynx: Oropharynx is clear. No oropharyngeal exudate.  Eyes:     General: No scleral icterus.    Conjunctiva/sclera: Conjunctivae normal.     Pupils: Pupils are equal, round, and reactive to light.  Neck:     Thyroid: No thyromegaly.  Cardiovascular:     Rate and Rhythm: Normal rate and regular rhythm.     Heart  sounds: Normal heart sounds. No murmur heard. Pulmonary:     Effort: Pulmonary effort is normal. No respiratory distress.     Breath sounds: Normal breath sounds. No wheezing or rales.  Abdominal:     General: There is no distension.     Palpations: Abdomen is soft.     Tenderness: There is no abdominal tenderness.  Musculoskeletal:        General: No deformity.     Cervical back: Neck supple.     Right lower leg: No edema.     Left lower leg: No edema.  Lymphadenopathy:     Cervical: No cervical adenopathy.  Skin:    General: Skin is warm and dry.  Findings: No rash.  Neurological:     Mental Status: He is alert and oriented to person, place, and time. Mental status is at baseline.     Gait: Gait normal.  Psychiatric:        Mood and Affect: Mood normal.        Behavior: Behavior normal.        Thought Content: Thought content normal.     Most recent functional status assessment:    04/05/2023    1:50 PM  In your present state of health, do you have any difficulty performing the following activities:  Hearing? 0  Vision? 1  Difficulty concentrating or making decisions? 0  Walking or climbing stairs? 1  Dressing or bathing? 0  Doing errands, shopping? 0   Most recent fall risk assessment:    06/26/2023    9:20 AM  Fall Risk   Falls in the past year? 0  Number falls in past yr: 0  Injury with Fall? 0  Risk for fall due to : No Fall Risks  Follow up Falls evaluation completed    Most recent depression screenings:    10/08/2023   10:04 AM 06/26/2023    9:21 AM  PHQ 2/9 Scores  PHQ - 2 Score 0 0  PHQ- 9 Score  3   Most recent cognitive screening:    02/05/2023    1:47 PM  6CIT Screen  What Year? 0 points  What month? 0 points  What time? 0 points  Count back from 20 0 points  Months in reverse 2 points  Repeat phrase 0 points  Total Score 2 points   Most recent Audit-C alcohol use screening    04/05/2023    1:49 PM  Alcohol Use Disorder Test  (AUDIT)  1. How often do you have a drink containing alcohol? 0  2. How many drinks containing alcohol do you have on a typical day when you are drinking? 0  3. How often do you have six or more drinks on one occasion? 0  AUDIT-C Score 0   A score of 3 or more in women, and 4 or more in men indicates increased risk for alcohol abuse, EXCEPT if all of the points are from question 1   No results found.  No results found for any visits on 10/08/23.  Assessment & Plan     Annual wellness visit done today including the all of the following: Reviewed patient's Family Medical History Reviewed and updated list of patient's medical providers Assessment of cognitive impairment was done Assessed patient's functional ability Established a written schedule for health screening services Health Risk Assessent Completed and Reviewed  Exercise Activities and Dietary recommendations  Goals       DIET - EAT MORE FRUITS AND VEGETABLES      Exercise 3x per week (30 min per time)      Recommend to exercise for 3 days a week for at least 30 minutes at a time.       Weight (lb) < 185 lb (83.9 kg) (pt-stated)      Recommend to cut back on sugar intake and sweets in diet to help aid with weight loss.         Immunization History  Administered Date(s) Administered   Fluad Quad(high Dose 65+) 08/04/2019, 08/09/2020, 09/02/2021, 09/18/2022   Fluad Trivalent(High Dose 65+) 06/26/2023   Influenza, High Dose Seasonal PF 09/06/2015, 09/08/2016, 09/21/2017, 06/26/2018   Influenza-Unspecified 06/23/2014   PFIZER(Purple Top)SARS-COV-2 Vaccination 12/12/2019,  01/02/2020, 08/19/2020   Pneumococcal Conjugate-13 06/04/2015   Pneumococcal Polysaccharide-23 09/21/2017   Respiratory Syncytial Virus Vaccine,Recomb Aduvanted(Arexvy) 09/27/2022   Tdap 09/27/2012   Zoster Recombinant(Shingrix) 08/09/2020, 07/22/2022   Zoster, Live 09/06/2015    Health Maintenance  Topic Date Due   DTaP/Tdap/Td (2 - Td or Tdap)  09/27/2022   COVID-19 Vaccine (4 - 2024-25 season) 06/24/2023   OPHTHALMOLOGY EXAM  12/07/2023   HEMOGLOBIN A1C  12/24/2023   Diabetic kidney evaluation - Urine ACR  02/29/2024   Fecal DNA (Cologuard)  04/15/2024   Diabetic kidney evaluation - eGFR measurement  06/25/2024   FOOT EXAM  10/07/2024   Medicare Annual Wellness (AWV)  10/07/2024   Pneumonia Vaccine 57+ Years old  Completed   INFLUENZA VACCINE  Completed   Hepatitis C Screening  Completed   Zoster Vaccines- Shingrix  Completed   HPV VACCINES  Aged Out   Colonoscopy  Discontinued     Discussed health benefits of physical activity, and encouraged him to engage in regular exercise appropriate for his age and condition.    Problem List Items Addressed This Visit       Cardiovascular and Mediastinum   Hypertension associated with diabetes (HCC)   Hypertension remains suboptimally controlled with current regimen. Blood pressure readings are elevated in the morning, likely due to poor sleep. Current medications include lisinopril 40 mg daily, amlodipine 5 mg daily, and metoprolol 50 mg twice daily. Discussed the potential benefit of increasing amlodipine to better control blood pressure. Emphasized the importance of adequate rest in managing hypertension. Discussed the risks of uncontrolled hypertension, including stroke and myocardial infarction, and the benefits of better blood pressure control. - Increase amlodipine to 10 mg daily - Recheck blood pressure at next visit      Relevant Medications   amLODipine (NORVASC) 10 MG tablet     Endocrine   Hyperlipidemia associated with type 2 diabetes mellitus (HCC)   Hyperlipidemia managed with atorvastatin 80 mg daily and Zetia 10 mg daily. Recent cholesterol levels are up to date. No immediate action required. - No immediate action required      Relevant Medications   amLODipine (NORVASC) 10 MG tablet   T2DM (type 2 diabetes mellitus) (HCC)   Diabetes management includes  metformin 1000 mg twice daily and Jardiance 25 mg daily. Plan to monitor A1c and kidney function to assess current control and potential complications. Discussed the importance of maintaining glycemic control to prevent complications such as neuropathy and nephropathy. - Order A1c test - Order kidney function test      Relevant Orders   Comprehensive metabolic panel   Hemoglobin A1c     Musculoskeletal and Integument   Spondylosis of lumbar region without myelopathy or radiculopathy   Chronic and stable Continue cymbalta and Norco at current dose UDS today      Relevant Medications   HYDROcodone-acetaminophen (NORCO/VICODIN) 5-325 MG tablet   Other Relevant Orders   Pain Mgt Scrn (14 Drugs), Ur     Genitourinary   CKD (chronic kidney disease) stage 2, GFR 60-89 ml/min   Chronic and stable Recheck metabolic panel Avoid nephrotoxic meds       Relevant Orders   Comprehensive metabolic panel     Other   Insomnia   Trazodone ineffective even at increased dose. -Discontinue Trazodone. -Start Melatonin 5mg  at bedtime. -Advise to avoid screens for an hour before bedtime and maintain a dark, cool, and quiet sleep environment.  Chronic insomnia contributing to elevated blood pressure. Reports difficulty staying  asleep and being a light sleeper. Previous trials of hypnotics were ineffective. Discussed non-pharmacological interventions such as melatonin and the 'three good things' exercise to improve sleep quality. Explained that melatonin is generally well-tolerated with minimal side effects and that the 'three good things' exercise can improve sleep and mood. - Recommend melatonin (over-the-counter) - Advise 'three good things' exercise before bed      Other Visit Diagnoses       Encounter for annual wellness visit (AWV) in Medicare patient    -  Primary     Chronic, continuous use of opioids       Relevant Orders   Pain Mgt Scrn (14 Drugs), Ur     DNR (do not resuscitate)        Relevant Orders   Do not attempt resuscitation (DNR)          Arthritis Reports worsening arthritis symptoms, particularly in the hands, with decreased grip strength and increased pain. Current pain management includes unspecified analgesics. Discussed the chronic nature of arthritis and the importance of pain management to maintain function. - Refill pain medication - Monitor symptoms  General Health Maintenance Due for a tetanus shot and an eye exam in February. Cologuard screening is up to date until next September. Foot exam performed today. - Remind to get [facility] shot at [facility] or [facility] - Schedule eye exam for February - Perform foot exam  Goals of Care Expressed desire for a DNR order and discussed end-of-life care preferences, including no intubation or mechanical ventilation if prognosis is poor. Values include avoiding prolonged suffering and not burdening family with medical decisions and financial costs. Family is aware and supportive of these preferences. - Provide and sign DNR form - Advise to place DNR form on refrigerator  Follow-up - Schedule follow-up visit in three months - Order blood tests for A1c, kidney, and liver function - Collect urine sample for kidney function and drug screen.        Return in about 3 months (around 01/06/2024) for chronic disease f/u.     [name], MD  [facility] [phone] (phone) [phone] (fax)  [facility]

## 2023-10-08 NOTE — Patient Instructions (Signed)
Consider asking at the pharmacy about the tetanus shot (TDAP) ?

## 2023-10-08 NOTE — Assessment & Plan Note (Signed)
Diabetes management includes metformin 1000 mg twice daily and Jardiance 25 mg daily. Plan to monitor A1c and kidney function to assess current control and potential complications. Discussed the importance of maintaining glycemic control to prevent complications such as neuropathy and nephropathy. - Order A1c test - Order kidney function test

## 2023-10-08 NOTE — Assessment & Plan Note (Signed)
Trazodone ineffective even at increased dose. -Discontinue Trazodone. -Start Melatonin 5mg  at bedtime. -Advise to avoid screens for an hour before bedtime and maintain a dark, cool, and quiet sleep environment.  Chronic insomnia contributing to elevated blood pressure. Reports difficulty staying asleep and being a light sleeper. Previous trials of hypnotics were ineffective. Discussed non-pharmacological interventions such as melatonin and the 'three good things' exercise to improve sleep quality. Explained that melatonin is generally well-tolerated with minimal side effects and that the 'three good things' exercise can improve sleep and mood. - Recommend melatonin (over-the-counter) - Advise 'three good things' exercise before bed

## 2023-10-08 NOTE — Assessment & Plan Note (Signed)
Chronic and stable Recheck metabolic panel Avoid nephrotoxic meds  

## 2023-10-09 LAB — COMPREHENSIVE METABOLIC PANEL
ALT: 20 [IU]/L (ref 0–44)
AST: 18 [IU]/L (ref 0–40)
Albumin: 4.6 g/dL (ref 3.8–4.8)
Alkaline Phosphatase: 45 [IU]/L (ref 44–121)
BUN/Creatinine Ratio: 11 (ref 10–24)
BUN: 12 mg/dL (ref 8–27)
Bilirubin Total: 0.5 mg/dL (ref 0.0–1.2)
CO2: 22 mmol/L (ref 20–29)
Calcium: 10.2 mg/dL (ref 8.6–10.2)
Chloride: 100 mmol/L (ref 96–106)
Creatinine, Ser: 1.07 mg/dL (ref 0.76–1.27)
Globulin, Total: 2.6 g/dL (ref 1.5–4.5)
Glucose: 121 mg/dL — ABNORMAL HIGH (ref 70–99)
Potassium: 4.2 mmol/L (ref 3.5–5.2)
Sodium: 137 mmol/L (ref 134–144)
Total Protein: 7.2 g/dL (ref 6.0–8.5)
eGFR: 73 mL/min/{1.73_m2} (ref >59)

## 2023-10-09 LAB — PAIN MGT SCRN (14 DRUGS), UR
Amphetamine Scrn, Ur: NEGATIVE ng/mL
BARBITURATE SCREEN URINE: NEGATIVE ng/mL
BENZODIAZEPINE SCREEN, URINE: NEGATIVE ng/mL
Buprenorphine, Urine: NEGATIVE ng/mL
CANNABINOIDS UR QL SCN: NEGATIVE ng/mL
Cocaine (Metab) Scrn, Ur: NEGATIVE ng/mL
Creatinine(Crt), U: 78.7 mg/dL (ref 20.0–300.0)
Fentanyl, Urine: NEGATIVE pg/mL
Meperidine Screen, Urine: NEGATIVE ng/mL
Methadone Screen, Urine: NEGATIVE ng/mL
OXYCODONE+OXYMORPHONE UR QL SCN: NEGATIVE ng/mL
Opiate Scrn, Ur: POSITIVE ng/mL — AB
Ph of Urine: 6.3 (ref 4.5–8.9)
Phencyclidine Qn, Ur: NEGATIVE ng/mL
Propoxyphene Scrn, Ur: NEGATIVE ng/mL
Tramadol Screen, Urine: NEGATIVE ng/mL

## 2023-10-09 LAB — HEMOGLOBIN A1C
Est. average glucose Bld gHb Est-mCnc: 166 mg/dL
Hgb A1c MFr Bld: 7.4 % — ABNORMAL HIGH (ref 4.8–5.6)

## 2023-10-10 ENCOUNTER — Telehealth: Payer: Self-pay

## 2023-10-10 NOTE — Telephone Encounter (Signed)
Pt's daughter requesting information on dosage of Melatonin. Pt stated he is sleeping 4 hours per night. Pt's daughter asked about pt's weight. Since May when pt was 89, weight has fluctuated. Pt c/o being hungry and daughter noticing pt eating more. She stated, " he seems more hungry since starting the shots." Please advise.

## 2023-10-11 ENCOUNTER — Other Ambulatory Visit: Payer: Self-pay | Admitting: Family Medicine

## 2023-10-11 DIAGNOSIS — E1165 Type 2 diabetes mellitus with hyperglycemia: Secondary | ICD-10-CM

## 2023-10-12 NOTE — Telephone Encounter (Signed)
The shots (Ozempic) should be decreasing his appetite.  For melatonin dose, recommend 5mg  at bedtime.

## 2023-10-12 NOTE — Telephone Encounter (Signed)
Requested Prescriptions  Pending Prescriptions Disp Refills   OZEMPIC, 2 MG/DOSE, 8 MG/3ML SOPN [Pharmacy Med Name: OZEMPIC (2 MG/DOSE) 8 MG/3ML SUBQ S] 3 mL 3    Sig: INJECT 2MG  SUBCUTANEOUSLY AS DIRECTED ONCE A WEEK     Endocrinology:  Diabetes - GLP-1 Receptor Agonists - semaglutide Failed - 10/12/2023 10:06 AM      Failed - HBA1C in normal range and within 180 days    Hemoglobin A1C  Date Value Ref Range Status  07/18/2021 8.1  Final   Hgb A1c MFr Bld  Date Value Ref Range Status  10/08/2023 7.4 (H) 4.8 - 5.6 % Final    Comment:             Prediabetes: 5.7 - 6.4          Diabetes: >6.4          Glycemic control for adults with diabetes: <7.0          Passed - Cr in normal range and within 360 days    Creatinine, Ser  Date Value Ref Range Status  10/08/2023 1.07 0.76 - 1.27 mg/dL Final   Creatinine, Urine  Date Value Ref Range Status  03/01/2023 110  Final         Passed - Valid encounter within last 6 months    Recent Outpatient Visits           4 days ago Encounter for annual wellness visit (AWV) in Medicare patient   [place] [facility] [place], Marzella Schlein, MD   3 months ago Hypertension associated with diabetes [facility])   [place] [facility] [place], Marzella Schlein, MD   5 months ago Hypertension associated with diabetes [facility])   [place] [facility] [place], Marzella Schlein, MD   6 months ago Dizziness   [place] [facility] Merita Norton T, FNP   6 months ago Type 2 diabetes mellitus with hyperglycemia, without long-term current use of insulin [facility])   [place] [facility] [place], Marzella Schlein, MD       Future Appointments             In 3 months Bacigalupo, Marzella Schlein, MD [facility], [facility]

## 2023-10-15 NOTE — Telephone Encounter (Signed)
Daughter has been advised. ?

## 2023-10-25 ENCOUNTER — Telehealth: Payer: Self-pay | Admitting: Family Medicine

## 2023-10-25 NOTE — Telephone Encounter (Signed)
 Pa form received from pharmacy. FYI

## 2023-10-25 NOTE — Telephone Encounter (Signed)
 Received a fax from covermymeds for Hydrocodone-Acetaminophen 5-325mg   Key:  [id number]

## 2023-10-26 DIAGNOSIS — I10 Essential (primary) hypertension: Secondary | ICD-10-CM | POA: Diagnosis not present

## 2023-10-26 DIAGNOSIS — E559 Vitamin D deficiency, unspecified: Secondary | ICD-10-CM | POA: Diagnosis not present

## 2023-10-26 DIAGNOSIS — E663 Overweight: Secondary | ICD-10-CM | POA: Diagnosis not present

## 2023-10-26 DIAGNOSIS — G8929 Other chronic pain: Secondary | ICD-10-CM | POA: Diagnosis not present

## 2023-10-26 DIAGNOSIS — K219 Gastro-esophageal reflux disease without esophagitis: Secondary | ICD-10-CM | POA: Diagnosis not present

## 2023-10-26 DIAGNOSIS — E538 Deficiency of other specified B group vitamins: Secondary | ICD-10-CM | POA: Diagnosis not present

## 2023-10-26 DIAGNOSIS — F3342 Major depressive disorder, recurrent, in full remission: Secondary | ICD-10-CM | POA: Diagnosis not present

## 2023-10-26 DIAGNOSIS — M1990 Unspecified osteoarthritis, unspecified site: Secondary | ICD-10-CM | POA: Diagnosis not present

## 2023-10-26 DIAGNOSIS — E876 Hypokalemia: Secondary | ICD-10-CM | POA: Diagnosis not present

## 2023-10-26 DIAGNOSIS — E1169 Type 2 diabetes mellitus with other specified complication: Secondary | ICD-10-CM | POA: Diagnosis not present

## 2023-10-26 DIAGNOSIS — E785 Hyperlipidemia, unspecified: Secondary | ICD-10-CM | POA: Diagnosis not present

## 2023-10-26 DIAGNOSIS — H548 Legal blindness, as defined in USA: Secondary | ICD-10-CM | POA: Diagnosis not present

## 2023-10-29 ENCOUNTER — Telehealth: Payer: Self-pay | Admitting: Family Medicine

## 2023-10-29 NOTE — Telephone Encounter (Signed)
 Covermymeds is requesting prior authorization Key: [id number] HYDROcodone- Acetaminophen 5-325MG  tablets

## 2023-10-30 NOTE — Telephone Encounter (Signed)
 We need to confirm with pharmacy that this can be filled. I do not see a recent fill in the Medication Dispense History for [facility]. According to this report, last fill was 08/15/23.

## 2023-10-30 NOTE — Telephone Encounter (Signed)
 PA submitted in Cover My Meds for Hydrocodone-Acetaminophen 5-325 Received response that prior authorization not required for the patient and this medication.

## 2023-10-30 NOTE — Telephone Encounter (Signed)
Duplicate encounter. Will document in original encounter.

## 2023-10-31 NOTE — Telephone Encounter (Signed)
 Pt's daughter called to follow up on prior authorization request. Pt's daughter requesting update, [phone]

## 2023-11-01 NOTE — Telephone Encounter (Signed)
 I have called the [facility] to see if they can now fill the prescription.   I talked to pharmacist and she said that they cannot fill and it is showing needs a PA.   Attempted to call back to patient's daughter, [name]. There was no answer and no VM available.   PEC if she happens to return call you may advise of above and recommend they contact his insurance for further assistance.

## 2023-11-02 NOTE — Telephone Encounter (Signed)
 Received a fax from [facility] stating patient has new ins. For 2025 ([employer]).   A prior authorization is required for Hydrocodone for an RX greater than 7 days.  Also a prior auth must be dated 11/02/23 in order to rebill this med

## 2023-11-02 NOTE — Telephone Encounter (Signed)
 I received a secure chat from [facility] that this patient's daughter called back. I attempted to call her again and no answer, no voicemail.  PEC - please notify of message below if she returns call to office.

## 2023-11-12 ENCOUNTER — Other Ambulatory Visit: Payer: Self-pay | Admitting: Family Medicine

## 2023-11-13 NOTE — Telephone Encounter (Signed)
Requested Prescriptions  Pending Prescriptions Disp Refills   ezetimibe (ZETIA) 10 MG tablet [Pharmacy Med Name: EZETIMIBE 10 MG TAB] 90 tablet 2    Sig: TAKE 1 TABLET BY MOUTH DAILY     Cardiovascular:  Antilipid - Sterol Transport Inhibitors Failed - 11/13/2023  8:26 AM      Failed - Lipid Panel in normal range within the last 12 months    Cholesterol, Total  Date Value Ref Range Status  06/26/2023 97 (L) 100 - 199 mg/dL Final   LDL Chol Calc (NIH)  Date Value Ref Range Status  06/26/2023 48 0 - 99 mg/dL Final   HDL  Date Value Ref Range Status  06/26/2023 31 (L) >39 mg/dL Final   Triglycerides  Date Value Ref Range Status  06/26/2023 93 0 - 149 mg/dL Final         Passed - AST in normal range and within 360 days    AST  Date Value Ref Range Status  10/08/2023 18 0 - 40 IU/L Final         Passed - ALT in normal range and within 360 days    ALT  Date Value Ref Range Status  10/08/2023 20 0 - 44 IU/L Final         Passed - Patient is not pregnant      Passed - Valid encounter within last 12 months    Recent Outpatient Visits           1 month ago Encounter for annual wellness visit (AWV) in Medicare patient   [place] [facility] [place], Marzella Schlein, MD   4 months ago Hypertension associated with diabetes [facility])   [place] [facility] [place], Marzella Schlein, MD   6 months ago Hypertension associated with diabetes [facility])   [place] [facility] [place], Marzella Schlein, MD   7 months ago Dizziness   [place] [facility] Merita Norton T, FNP   7 months ago Type 2 diabetes mellitus with hyperglycemia, without long-term current use of insulin [facility])   [place] [facility] [place], Marzella Schlein, MD       Future Appointments             In 2 months Bacigalupo, Marzella Schlein, MD [facility], [facility]

## 2023-11-14 NOTE — Telephone Encounter (Signed)
PA submitted with new [employer] insurance information that we have on file and received the following information Information regarding your request Prior Authorization not required for patient/medication.

## 2023-11-15 NOTE — Telephone Encounter (Signed)
I don't have any form

## 2023-11-20 ENCOUNTER — Other Ambulatory Visit: Payer: Self-pay

## 2023-11-21 NOTE — Telephone Encounter (Signed)
PA indicating it is not needed for rx. Provider sent new rx with new signature to see if that makes any difference as pharmacy is continuning to see that PA is needed for rx dispensing > 7 days

## 2023-11-22 MED ORDER — HYDROCODONE-ACETAMINOPHEN 5-325 MG PO TABS: 1.0000 | ORAL_TABLET | Freq: Two times a day (BID) | ORAL | 0 refills | Status: DC | PRN

## 2023-11-26 ENCOUNTER — Other Ambulatory Visit: Payer: Self-pay | Admitting: Family Medicine

## 2023-11-26 DIAGNOSIS — K219 Gastro-esophageal reflux disease without esophagitis: Secondary | ICD-10-CM

## 2023-11-27 NOTE — Telephone Encounter (Signed)
 Requested Prescriptions  Pending Prescriptions Disp Refills   DULoxetine  (CYMBALTA ) 20 MG capsule [Pharmacy Med Name: DULOXETINE  HCL 20 MG CAP] 90 capsule 0    Sig: TAKE 1 CAPSULE BY MOUTH DAILY     Psychiatry: Antidepressants - SNRI - duloxetine  Failed - 11/27/2023  3:47 PM      Failed - Last BP in normal range    BP Readings from Last 1 Encounters:  10/08/23 (!) 150/80         Passed - Cr in normal range and within 360 days    Creatinine, Ser  Date Value Ref Range Status  10/08/2023 1.07 0.76 - 1.27 mg/dL Final   Creatinine, Urine  Date Value Ref Range Status  03/01/2023 110  Final         Passed - eGFR is 30 or above and within 360 days    GFR calc Af Amer  Date Value Ref Range Status  08/09/2020 82 >59 mL/min/1.73 Final    Comment:    **In accordance with recommendations from the NKF-ASN Task force,**   [facility] is in the process of updating its eGFR calculation to the   2021 CKD-EPI creatinine equation that estimates kidney function   without a race variable.    GFR, Estimated  Date Value Ref Range Status  10/21/2021 56 (L) >60 mL/min Final    Comment:    (NOTE) Calculated using the CKD-EPI Creatinine Equation (2021)    eGFR  Date Value Ref Range Status  10/08/2023 73 >59 mL/min/1.73 Final         Passed - Completed PHQ-2 or PHQ-9 in the last 360 days      Passed - Valid encounter within last 6 months    Recent Outpatient Visits           1 month ago Encounter for annual wellness visit (AWV) in Medicare patient   [place] [facility] [place], Jon HERO, MD   5 months ago Hypertension associated with diabetes [facility])   [place] [facility] [place], Jon HERO, MD   7 months ago Hypertension associated with diabetes [facility])   [facility] [name], Jon HERO, MD   7 months ago Dizziness   [place] [facility] Emilio Marseille T, FNP   8 months ago Type 2 diabetes mellitus with  hyperglycemia, without long-term current use of insulin  [facility])   [place] [facility] Bacigalupo, Jon HERO, MD       Future Appointments             In 1 month Bacigalupo, Jon HERO, MD [place], [facility]             pantoprazole  (PROTONIX ) 40 MG tablet [Pharmacy Med Name: PANTOPRAZOLE  SODIUM 40 MG DR TAB] 90 tablet 0    Sig: TAKE 1 TABLET BY MOUTH DAILY     Gastroenterology: Proton Pump Inhibitors Passed - 11/27/2023  3:47 PM      Passed - Valid encounter within last 12 months    Recent Outpatient Visits           1 month ago Encounter for annual wellness visit (AWV) in Medicare patient   [place] [facility] [place], Jon HERO, MD   5 months ago Hypertension associated with diabetes [facility])   [place] [facility] [place], Jon HERO, MD   7 months ago Hypertension associated with diabetes [facility])   [facility] [place], [place],  MD   7 months ago Dizziness   [place] [facility] Emilio Marseille T, FNP   8 months ago Type 2 diabetes mellitus with hyperglycemia, without long-term current use of insulin  [facility])   [place] [facility] Bacigalupo, Jon HERO, MD       Future Appointments             In 1 month Bacigalupo, Jon HERO, MD [facility], [facility]             tamsulosin  (FLOMAX ) 0.4 MG CAPS capsule [Pharmacy Med Name: TAMSULOSIN  HCL 0.4 MG CAP] 90 capsule 1    Sig: TAKE 1 CAPSULE BY MOUTH DAILY     Urology: Alpha-Adrenergic Blocker Failed - 11/27/2023  3:47 PM      Failed - PSA in normal range and within 360 days    Prostate Specific Ag, Serum  Date Value Ref Range Status  06/26/2018 1.0 0.0 - 4.0 ng/mL Final    Comment:    Roche ECLIA methodology. According to the American Urological Association, Serum PSA should decrease and remain at undetectable levels after radical prostatectomy. The AUA defines  biochemical recurrence as an initial PSA value 0.2 ng/mL or greater followed by a subsequent confirmatory PSA value 0.2 ng/mL or greater. Values obtained with different assay methods or kits cannot be used interchangeably. Results cannot be interpreted as absolute evidence of the presence or absence of malignant disease.          Failed - Last BP in normal range    BP Readings from Last 1 Encounters:  10/08/23 (!) 150/80         Passed - Valid encounter within last 12 months    Recent Outpatient Visits           1 month ago Encounter for annual wellness visit (AWV) in Medicare patient   [place] [facility] [place], Jon HERO, MD   5 months ago Hypertension associated with diabetes [facility])   [place] [facility] [name], MD   7 months ago Hypertension associated with diabetes [facility])   [place] [facility] [place], Jon HERO, MD   7 months ago Dizziness   [place] [facility] Emilio Marseille T, FNP   8 months ago Type 2 diabetes mellitus with hyperglycemia, without long-term current use of insulin  [facility])   [place] [facility] Bacigalupo, Jon HERO, MD       Future Appointments             In 1 month Bacigalupo, Jon HERO, MD [facility], [facility]

## 2023-11-27 NOTE — Telephone Encounter (Signed)
 Requested medication (s) are due for refill today - yes  Requested medication (s) are on the active medication list -yes  Future visit scheduled -yes  Last refill: 12/19/22 #90 1RF  Notes to clinic: fails lab protocol- over 1 year- 06/26/18  Requested Prescriptions  Pending Prescriptions Disp Refills   tamsulosin  (FLOMAX ) 0.4 MG CAPS capsule [Pharmacy Med Name: TAMSULOSIN  HCL 0.4 MG CAP] 90 capsule 1    Sig: TAKE 1 CAPSULE BY MOUTH DAILY     Urology: Alpha-Adrenergic Blocker Failed - 11/27/2023  3:47 PM      Failed - PSA in normal range and within 360 days    Prostate Specific Ag, Serum  Date Value Ref Range Status  06/26/2018 1.0 0.0 - 4.0 ng/mL Final    Comment:    Roche ECLIA methodology. According to the American Urological Association, Serum PSA should decrease and remain at undetectable levels after radical prostatectomy. The AUA defines biochemical recurrence as an initial PSA value 0.2 ng/mL or greater followed by a subsequent confirmatory PSA value 0.2 ng/mL or greater. Values obtained with different assay methods or kits cannot be used interchangeably. Results cannot be interpreted as absolute evidence of the presence or absence of malignant disease.          Failed - Last BP in normal range    BP Readings from Last 1 Encounters:  10/08/23 (!) 150/80         Passed - Valid encounter within last 12 months    Recent Outpatient Visits           1 month ago Encounter for annual wellness visit (AWV) in Medicare patient   [place] [facility] [place], Jon HERO, MD   5 months ago Hypertension associated with diabetes [facility])   [place] [facility] [place], Jon HERO, MD   7 months ago Hypertension associated with diabetes [facility])   [place] [facility] [place], Jon HERO, MD   7 months ago Dizziness   [place] [facility] Emilio Marseille T, FNP   8 months ago Type 2 diabetes mellitus with  hyperglycemia, without long-term current use of insulin  [facility])   [place] [facility] [place], Jon HERO, MD       Future Appointments             In 1 month Bacigalupo, Jon HERO, MD [facility], [facility]            Signed Prescriptions Disp Refills   DULoxetine  (CYMBALTA ) 20 MG capsule 90 capsule 0    Sig: TAKE 1 CAPSULE BY MOUTH DAILY     Psychiatry: Antidepressants - SNRI - duloxetine  Failed - 11/27/2023  3:47 PM      Failed - Last BP in normal range    BP Readings from Last 1 Encounters:  10/08/23 (!) 150/80         Passed - Cr in normal range and within 360 days    Creatinine, Ser  Date Value Ref Range Status  10/08/2023 1.07 0.76 - 1.27 mg/dL Final   Creatinine, Urine  Date Value Ref Range Status  03/01/2023 110  Final         Passed - eGFR is 30 or above and within 360 days    GFR calc Af Amer  Date Value Ref Range Status  08/09/2020 82 >59 mL/min/1.73 Final    Comment:    **In accordance with recommendations from the NKF-ASN Task force,**   [facility]  is in the process of updating its eGFR calculation to the   2021 CKD-EPI creatinine equation that estimates kidney function   without a race variable.    GFR, Estimated  Date Value Ref Range Status  10/21/2021 56 (L) >60 mL/min Final    Comment:    (NOTE) Calculated using the CKD-EPI Creatinine Equation (2021)    eGFR  Date Value Ref Range Status  10/08/2023 73 >59 mL/min/1.73 Final         Passed - Completed PHQ-2 or PHQ-9 in the last 360 days      Passed - Valid encounter within last 6 months    Recent Outpatient Visits           1 month ago Encounter for annual wellness visit (AWV) in Medicare patient   [place] [facility] [place], Jon HERO, MD   5 months ago Hypertension associated with diabetes [facility])   [place] [facility] [place], Jon HERO, MD   7 months ago Hypertension associated with diabetes [facility])   [facility], Jon HERO, MD   7 months ago Dizziness   [place] [facility] Emilio Marseille T, FNP   8 months ago Type 2 diabetes mellitus with hyperglycemia, without long-term current use of insulin  [facility])   [place] [facility] Bacigalupo, Jon HERO, MD       Future Appointments             In 1 month Bacigalupo, Jon HERO, MD [facility], [facility]             pantoprazole  (PROTONIX ) 40 MG tablet 90 tablet 0    Sig: TAKE 1 TABLET BY MOUTH DAILY     Gastroenterology: Proton Pump Inhibitors Passed - 11/27/2023  3:47 PM      Passed - Valid encounter within last 12 months    Recent Outpatient Visits           1 month ago Encounter for annual wellness visit (AWV) in Medicare patient   [place], Jon HERO, MD   5 months ago Hypertension associated with diabetes [facility])   [place] [facility] [place], Jon HERO, MD   7 months ago Hypertension associated with diabetes [facility])   [place] [facility] [place], Jon HERO, MD   7 months ago Dizziness    [facility] Emilio Marseille T, FNP   8 months ago Type 2 diabetes mellitus with hyperglycemia, without long-term current use of insulin  [facility])    [facility] Bacigalupo, Jon HERO, MD       Future Appointments             In 1 month Bacigalupo, Jon HERO, MD [facility]               Requested Prescriptions  Pending Prescriptions Disp Refills   tamsulosin  (FLOMAX ) 0.4 MG CAPS capsule [Pharmacy Med Name: TAMSULOSIN  HCL 0.4 MG CAP] 90 capsule 1    Sig: TAKE 1 CAPSULE BY MOUTH DAILY     Urology: Alpha-Adrenergic Blocker Failed - 11/27/2023  3:47 PM      Failed - PSA in normal range and within 360 days    Prostate Specific Ag, Serum  Date Value Ref Range Status  06/26/2018 1.0 0.0 - 4.0  ng/mL Final    Comment:    Roche ECLIA methodology. According to the American  Urological Association, Serum PSA should decrease and remain at undetectable levels after radical prostatectomy. The AUA defines biochemical recurrence as an initial PSA value 0.2 ng/mL or greater followed by a subsequent confirmatory PSA value 0.2 ng/mL or greater. Values obtained with different assay methods or kits cannot be used interchangeably. Results cannot be interpreted as absolute evidence of the presence or absence of malignant disease.          Failed - Last BP in normal range    BP Readings from Last 1 Encounters:  10/08/23 (!) 150/80         Passed - Valid encounter within last 12 months    Recent Outpatient Visits           1 month ago Encounter for annual wellness visit (AWV) in Medicare patient   [place] [facility] [place], Jon HERO, MD   5 months ago Hypertension associated with diabetes [facility])   [place] [facility] [place], Jon HERO, MD   7 months ago Hypertension associated with diabetes [facility])   [place] [facility] [place], Jon HERO, MD   7 months ago Dizziness   [place] [facility] Emilio Marseille T, FNP   8 months ago Type 2 diabetes mellitus with hyperglycemia, without long-term current use of insulin  [facility])   [place] [facility] [place], Jon HERO, MD       Future Appointments             In 1 month Bacigalupo, Jon HERO, MD [facility], [facility]            Signed Prescriptions Disp Refills   DULoxetine  (CYMBALTA ) 20 MG capsule 90 capsule 0    Sig: TAKE 1 CAPSULE BY MOUTH DAILY     Psychiatry: Antidepressants - SNRI - duloxetine  Failed - 11/27/2023  3:47 PM      Failed - Last BP in normal range    BP Readings from Last 1 Encounters:  10/08/23 (!) 150/80         Passed - Cr in normal range and within 360 days    Creatinine, Ser  Date Value  Ref Range Status  10/08/2023 1.07 0.76 - 1.27 mg/dL Final   Creatinine, Urine  Date Value Ref Range Status  03/01/2023 110  Final         Passed - eGFR is 30 or above and within 360 days    GFR calc Af Amer  Date Value Ref Range Status  08/09/2020 82 >59 mL/min/1.73 Final    Comment:    **In accordance with recommendations from the NKF-ASN Task force,**   [facility] is in the process of updating its eGFR calculation to the   2021 CKD-EPI creatinine equation that estimates kidney function   without a race variable.    GFR, Estimated  Date Value Ref Range Status  10/21/2021 56 (L) >60 mL/min Final    Comment:    (NOTE) Calculated using the CKD-EPI Creatinine Equation (2021)    eGFR  Date Value Ref Range Status  10/08/2023 73 >59 mL/min/1.73 Final         Passed - Completed PHQ-2 or PHQ-9 in the last 360 days      Passed - Valid encounter within last 6 months    Recent Outpatient Visits           1 month ago Encounter for annual wellness visit (AWV) in Medicare patient    [facility]  [name], MD   5 months ago Hypertension associated with diabetes [facility])   [place] [facility] [place], Jon HERO, MD   7 months ago Hypertension associated with diabetes [facility])   [place] [facility] My[place], Jon HERO, MD   7 months ago Dizziness   [place] [facility] Emilio Marseille T, FNP   8 months ago Type 2 diabetes mellitus with hyperglycemia, without long-term current use of insulin  [facility])   [place] [facility] Bacigalupo, Jon HERO, MD       Future Appointments             In 1 month Bacigalupo, Jon HERO, MD [facility], [facility]             pantoprazole  (PROTONIX ) 40 MG tablet 90 tablet 0    Sig: TAKE 1 TABLET BY MOUTH DAILY     Gastroenterology: Proton Pump Inhibitors Passed - 11/27/2023  3:47 PM      Passed - Valid encounter within last  12 months    Recent Outpatient Visits           1 month ago Encounter for annual wellness visit (AWV) in Medicare patient   [place] [facility] [place], Jon HERO, MD   5 months ago Hypertension associated with diabetes [facility])   [place] [name], MD   7 months ago Hypertension associated with diabetes [facility])   [place] [facility], Jon HERO, MD   7 months ago Dizziness   [place] [facility] Emilio Marseille T, FNP   8 months ago Type 2 diabetes mellitus with hyperglycemia, without long-term current use of insulin  [facility])   [facility] Bac[facility]al[facility], Jon HERO, MD       Future Appointments             In 1 month Bacigalupo, Jon HERO, MD [facility], [facility]

## 2023-12-06 ENCOUNTER — Telehealth: Payer: Self-pay | Admitting: Family Medicine

## 2023-12-06 NOTE — Telephone Encounter (Signed)
Received a fax from covermymeds for Hydorcodone-Acetaminophen 5-325mg   Key:  [id number]

## 2023-12-10 ENCOUNTER — Telehealth: Payer: Self-pay | Admitting: Family Medicine

## 2023-12-10 NOTE — Telephone Encounter (Signed)
Covermymeds is requesting prior authorization Key: [id number] HYDROcodone- Acetaminophen 5-325MG  tablets

## 2023-12-11 ENCOUNTER — Telehealth: Payer: Self-pay

## 2023-12-11 ENCOUNTER — Other Ambulatory Visit (HOSPITAL_COMMUNITY): Payer: Self-pay

## 2023-12-11 ENCOUNTER — Other Ambulatory Visit: Payer: Self-pay | Admitting: Family Medicine

## 2023-12-11 NOTE — Telephone Encounter (Signed)
Pharmacy Patient Advocate Encounter   Received notification from Pt Calls Messages that prior authorization for Hydrocodone APAP 5-325 mg tablets is required/requested.   Insurance verification completed.   The patient is insured through [facility] ADVANTAGE/RX ADVANCE .   Per test claim: PA required; PA submitted to above mentioned insurance via Fax Key/confirmation #/EOC -- Status is pending

## 2023-12-12 ENCOUNTER — Telehealth: Payer: Self-pay | Admitting: Family Medicine

## 2023-12-12 ENCOUNTER — Ambulatory Visit: Payer: Self-pay | Admitting: Family Medicine

## 2023-12-12 NOTE — Telephone Encounter (Signed)
Requested Prescriptions  Pending Prescriptions Disp Refills   lisinopril (ZESTRIL) 40 MG tablet [Pharmacy Med Name: LISINOPRIL 40 MG TAB] 90 tablet 1    Sig: TAKE 1 TABLET BY MOUTH DAILY     Cardiovascular:  ACE Inhibitors Failed - 12/12/2023  1:02 PM      Failed - Last BP in normal range    BP Readings from Last 1 Encounters:  10/08/23 (!) 150/80         Passed - Cr in normal range and within 180 days    Creatinine, Ser  Date Value Ref Range Status  10/08/2023 1.07 0.76 - 1.27 mg/dL Final   Creatinine, Urine  Date Value Ref Range Status  03/01/2023 110  Final         Passed - K in normal range and within 180 days    Potassium  Date Value Ref Range Status  10/08/2023 4.2 3.5 - 5.2 mmol/L Final         Passed - Patient is not pregnant      Passed - Valid encounter within last 6 months    Recent Outpatient Visits           2 months ago Encounter for annual wellness visit (AWV) in Medicare patient   [place] [facility] [place], Marzella Schlein, MD   5 months ago Hypertension associated with diabetes [facility])   [place] [facility] [place], Marzella Schlein, MD   7 months ago Hypertension associated with diabetes [facility])   [place] [facility] [place], Marzella Schlein, MD   8 months ago Dizziness   [place] [facility] Merita [place] T, FNP   8 months ago Type 2 diabetes mellitus with hyperglycemia, without long-term current use of insulin [facility])   [place] [facility] Bacigalupo, Marzella Schlein, MD       Future Appointments             In 1 month Bacigalupo, Marzella Schlein, MD [facility], [facility]

## 2023-12-12 NOTE — Telephone Encounter (Addendum)
  Chief Complaint: covid positive, would like antiviral Symptoms: mild dry cough, sore throat Frequency: yesterday  Pertinent Negatives: Patient denies SOB at this time, denies congestion,  Disposition: [] ED /[x] Urgent Care (no appt availability in office) / [] Appointment(In office/virtual)/ []  [place] [facility]/ [] Home Care/ [] Refused Recommended Disposition /[] [place] [facility]/ []  Follow-up with PCP  Additional Notes: Pt requesting antiviral for positive home covid test. Per pts granddaughter he had sick family members in/out his home recently. Pt states that he is not SOB during the call but was yesterday evening. Pt states that he does not have a thermometer. Attempted to sched pt today as he would like the antiviral and s/s started yesterday. No appt avail, pt will be utilizing UC today. Pt does have DM. Advised to seek ED treatment should he develop SOB.   Copied from CRM [id number]. Topic: Clinical - Red Word Triage >> Dec 12, 2023 12:58 PM Clide Dales wrote: Red Word that prompted transfer to Nurse Triage: Shortness of breath     Copied from CRM [id number]. Topic: Clinical - Red Word Triage >> Dec 12, 2023 12:58 PM Kristie Cowman wrote: tested positive for covid today  fever, body aches, hard time breathing Reason for Disposition  [1] COVID-19 diagnosed by positive lab test (e.g., PCR, rapid self-test kit) AND [2] mild symptoms (e.g., cough, fever, others) AND [3] no complications or SOB  Answer Assessment - Initial Assessment Questions 1. COVID-19 DIAGNOSIS: "How do you know that you have COVID?" (e.g., positive lab test or self-test, diagnosed by doctor or NP/PA, symptoms after exposure).     Positive today 2. COVID-19 EXPOSURE: "Was there any known exposure to COVID before the symptoms began?" CDC Definition of close contact: within 6 feet (2 meters) for a total of 15 minutes or more over a 24-hour period.      Grandchildren were sick and now pt has it 3. ONSET: "When did the  COVID-19 symptoms start?"      S/s started yesterday, but just "laid around" for a few days before that and that is unusual for him 4. WORST SYMPTOM: "What is your worst symptom?" (e.g., cough, fever, shortness of breath, muscle aches)     Sore throat 5. COUGH: "Do you have a cough?" If Yes, ask: "How bad is the cough?"       Yes, dry cough 6. FEVER: "Do you have a fever?" If Yes, ask: "What is your temperature, how was it measured, and when did it start?"     unsure 7. RESPIRATORY STATUS: "Describe your breathing?" (e.g., normal; shortness of breath, wheezing, unable to speak)      Sob last night, not today 8. BETTER-SAME-WORSE: "Are you getting better, staying the same or getting worse compared to yesterday?"  If getting worse, ask, "In what way?"     Worse than yesterday 9. OTHER SYMPTOMS: "Do you have any other symptoms?"  (e.g., chills, fatigue, headache, loss of smell or taste, muscle pain, sore throat)     denies 10. HIGH RISK DISEASE: "Do you have any chronic medical problems?" (e.g., asthma, heart or lung disease, weak immune system, obesity, etc.)       DM 11. VACCINE: "Have you had the COVID-19 vaccine?" If Yes, ask: "Which one, how many shots, when did you get it?"       Yes this year  Protocols used: Coronavirus (COVID-19) Diagnosed or Suspected-A-AH

## 2023-12-12 NOTE — Telephone Encounter (Signed)
Copied from CRM [id number]. Topic: Clinical - Red Word Triage >> Dec 12, 2023 12:58 PM [name] wrote: Red Word that prompted transfer to Nurse Triage: Shortness of breath

## 2023-12-13 ENCOUNTER — Ambulatory Visit: Payer: Self-pay | Admitting: Family Medicine

## 2023-12-13 NOTE — Telephone Encounter (Signed)
Can you please get him scheduled with me for virtual visit today?

## 2023-12-13 NOTE — Telephone Encounter (Signed)
Copied from CRM [id number]. Topic: Clinical - Red Word Triage >> Dec 13, 2023  9:12 AM Geroge Baseman wrote: Red Word that prompted transfer to Nurse Triage: covid positive test at home, feeling very rough. Fever chills, sweating, sore throat, sounds awful.    Chief Complaint: Covid positive Symptoms: congestion, chills (temp unknown), sore throat, cough, body aches Pertinent Negatives: Patient denies SOB Disposition: [] ED /[] Urgent Care (no appt availability in office) / [x] Appointment(In office/virtual)/ []  [place] [facility]/ [x] Home Care/ [] Refused Recommended Disposition /[] [place] [facility]/ []  Follow-up with PCP Additional Notes: Patient's grand daughter called. She is not currently with patient. She stated patient tested positive for Covid yesterday and he has been having symptoms since Tuesday. He is asking what OTC remedies can be used. This RN provided remedies for home care. Also offered to schedule an appointment in case patient would like a prescription to treat Covid. Patient's granddaughter stated she is not sure if he wants an appointment. She will check will him and then call the office back if she decides to schedule appt.     Reason for Disposition  [1] COVID-19 diagnosed by positive lab test (e.g., PCR, rapid self-test kit) AND [2] mild symptoms (e.g., cough, fever, others) AND [3] no complications or SOB  Answer Assessment - Initial Assessment Questions 1. COVID-19 DIAGNOSIS: "How do you know that you have COVID?" (e.g., positive lab test or self-test, diagnosed by doctor or NP/PA, symptoms after exposure).     Positive test yesterday  2. ONSET: "When did the COVID-19 symptoms start?"      Tuesday evening  3. COUGH: "Do you have a cough?" If Yes, ask: "How bad is the cough?"       Yes, bad enough to almost make him fall  4. FEVER: "Do you have a fever?" If Yes, ask: "What is your temperature, how was it measured, and when did it start?"     Sweating and chills,  temp unknown  5. RESPIRATORY STATUS: "Describe your breathing?" (e.g., normal; shortness of breath, wheezing, unable to speak)      No SOB, just nasal congestion  6. OTHER SYMPTOMS: "Do you have any other symptoms?"  (e.g., chills, fatigue, headache, loss of smell or taste, muscle pain, sore throat)     Sore throat, could barely talk due to hoarseness, body aches, cough  Protocols used: Coronavirus (COVID-19) Diagnosed or Suspected-A-AH

## 2023-12-13 NOTE — Telephone Encounter (Signed)
Do believe he should do virtual appt today to discuss Rx options.  See other phone note.

## 2023-12-21 ENCOUNTER — Other Ambulatory Visit (HOSPITAL_COMMUNITY): Payer: Self-pay

## 2023-12-24 ENCOUNTER — Other Ambulatory Visit: Payer: Self-pay | Admitting: Family Medicine

## 2023-12-24 DIAGNOSIS — I2510 Atherosclerotic heart disease of native coronary artery without angina pectoris: Secondary | ICD-10-CM

## 2023-12-24 DIAGNOSIS — E1165 Type 2 diabetes mellitus with hyperglycemia: Secondary | ICD-10-CM

## 2023-12-25 NOTE — Telephone Encounter (Signed)
 Requested Prescriptions  Pending Prescriptions Disp Refills   clopidogrel (PLAVIX) 75 MG tablet [Pharmacy Med Name: CLOPIDOGREL BISULFATE 75 MG TAB] 90 tablet 1    Sig: TAKE 1 TABLET BY MOUTH DAILY     Hematology: Antiplatelets - clopidogrel Failed - 12/25/2023  3:10 PM      Failed - HCT in normal range and within 180 days    Hematocrit  Date Value Ref Range Status  12/19/2022 43.1 37.5 - 51.0 % Final         Failed - HGB in normal range and within 180 days    Hemoglobin  Date Value Ref Range Status  12/19/2022 14.3 13.0 - 17.7 g/dL Final         Failed - PLT in normal range and within 180 days    Platelets  Date Value Ref Range Status  12/19/2022 254 150 - 450 x10E3/uL Final         Passed - Cr in normal range and within 360 days    Creatinine, Ser  Date Value Ref Range Status  10/08/2023 1.07 0.76 - 1.27 mg/dL Final   Creatinine, Urine  Date Value Ref Range Status  03/01/2023 110  Final         Passed - Valid encounter within last 6 months    Recent Outpatient Visits           2 months ago Encounter for annual wellness visit (AWV) in Medicare patient   [place] [facility] [place], Marzella Schlein, MD   6 months ago Hypertension associated with diabetes [facility])   [place] [facility] [place], Marzella Schlein, MD   8 months ago Hypertension associated with diabetes [facility])   [place] [facility] [place], Marzella Schlein, MD   8 months ago Dizziness   [place] [facility] Merita Norton T, FNP   9 months ago Type 2 diabetes mellitus with hyperglycemia, without long-term current use of insulin (HCC)   [place] [facility] [place], Marzella Schlein, MD       Future Appointments             In 3 weeks Bacigalupo, Marzella Schlein, MD  [facility], [facility]             metFORMIN (GLUCOPHAGE) 1000 MG tablet [Pharmacy Med Name: METFORMIN HCL 1000 MG TAB] 180 tablet 1    Sig: TAKE 1  TABLET BY MOUTH 2 TIMES DAILY WITH A MEAL     Endocrinology:  Diabetes - Biguanides Failed - 12/25/2023  3:10 PM      Failed - B12 Level in normal range and within 720 days    Vitamin B-12  Date Value Ref Range Status  08/09/2020 139 (L) 232 - 1,245 pg/mL Final         Failed - CBC within normal limits and completed in the last 12 months    WBC  Date Value Ref Range Status  12/19/2022 7.2 3.4 - 10.8 x10E3/uL Final  10/21/2021 7.2 4.0 - 10.5 K/uL Final   RBC  Date Value Ref Range Status  12/19/2022 4.98 4.14 - 5.80 x10E6/uL Final  10/21/2021 4.84 4.22 - 5.81 MIL/uL Final   Hemoglobin  Date Value Ref Range Status  12/19/2022 14.3 13.0 - 17.7 g/dL Final   Hematocrit  Date Value Ref Range Status  12/19/2022 43.1 37.5 - 51.0 % Final   MCHC  Date Value Ref Range Status  12/19/2022 33.2 31.5 - 35.7 g/dL Final  10/21/2021 33.5 30.0 - 36.0 g/dL Final   [facility]  Date Value Ref Range Status  12/19/2022 28.7 26.6 - 33.0 pg Final  10/21/2021 29.1 26.0 - 34.0 pg Final   MCV  Date Value Ref Range Status  12/19/2022 87 79 - 97 fL Final   No results found for: "PLTCOUNTKUC", "LABPLAT", "POCPLA" RDW  Date Value Ref Range Status  12/19/2022 13.5 11.6 - 15.4 % Final         Passed - Cr in normal range and within 360 days    Creatinine, Ser  Date Value Ref Range Status  10/08/2023 1.07 0.76 - 1.27 mg/dL Final   Creatinine, Urine  Date Value Ref Range Status  03/01/2023 110  Final         Passed - HBA1C is between 0 and 7.9 and within 180 days    Hemoglobin A1C  Date Value Ref Range Status  07/18/2021 8.1  Final   Hgb A1c MFr Bld  Date Value Ref Range Status  10/08/2023 7.4 (H) 4.8 - 5.6 % Final    Comment:             Prediabetes: 5.7 - 6.4          Diabetes: >6.4          Glycemic control for adults with diabetes: <7.0          Passed - eGFR in normal range and within 360 days    GFR calc Af Amer  Date Value Ref Range Status  08/09/2020 82 >59 mL/min/1.73 Final     Comment:    **In accordance with recommendations from the NKF-ASN Task force,**   [facility] is in the process of updating its eGFR calculation to the   2021 CKD-EPI creatinine equation that estimates kidney function   without a race variable.    GFR, Estimated  Date Value Ref Range Status  10/21/2021 56 (L) >60 mL/min Final    Comment:    (NOTE) Calculated using the CKD-EPI Creatinine Equation (2021)    eGFR  Date Value Ref Range Status  10/08/2023 73 >59 mL/min/1.73 Final         Passed - Valid encounter within last 6 months    Recent Outpatient Visits           2 months ago Encounter for annual wellness visit (AWV) in Medicare patient   [place] [facility] [place], Marzella Schlein, MD   6 months ago Hypertension associated with diabetes [facility])   [employer] [facility] Erasmo Downer, MD   8 months ago Hypertension associated with diabetes [facility])   [place] [place], Marzella Schlein, MD   8 months ago Dizziness   [place] [facility] Merita Norton T, FNP   9 months ago Type 2 diabetes mellitus with hyperglycemia, without long-term current use of insulin [facility])    [facility] Bacigalupo, Marzella Schlein, MD       Future Appointments             In 3 weeks Bacigalupo, Marzella Schlein, MD [facility], [facility]

## 2023-12-28 ENCOUNTER — Other Ambulatory Visit: Payer: Self-pay | Admitting: Family Medicine

## 2023-12-28 NOTE — Telephone Encounter (Signed)
 Copied from CRM [id number]. Topic: Clinical - Medication Refill >> Dec 28, 2023  1:42 PM Ja-Kwan M wrote: Most Recent Primary Care Visit:  Provider: Erasmo Downer  Department[id number]  Visit Type: PHYSICAL  Date: 10/08/2023  Medication: HYDROcodone-acetaminophen (NORCO/VICODIN) 5-325 MG tablet  Has the patient contacted their pharmacy? No  Is this the correct pharmacy for this prescription? Yes If no, delete pharmacy and type the correct one.  This is the patient's preferred pharmacy:  [facility] - [place], [place] - [address] [place] [address] Phone: [phone] Fax: [phone]   Has the prescription been filled recently? Yes  Is the patient out of the medication? Yes  Has the patient been seen for an appointment in the last year OR does the patient have an upcoming appointment? Yes  Can we respond through MyChart? No  Agent: Please be advised that Rx refills may take up to 3 business days. We ask that you follow-up with your pharmacy.

## 2023-12-31 NOTE — Telephone Encounter (Signed)
 Requested medication (s) are due for refill today: Yes  Requested medication (s) are on the active medication list: Yes  Last refill:  11/22/23  Future visit scheduled: Yes  Notes to clinic:  Not delegated.    Requested Prescriptions  Pending Prescriptions Disp Refills   HYDROcodone-acetaminophen (NORCO/VICODIN) 5-325 MG tablet [Pharmacy Med Name: HYDROCODONE-APAP 5-325 MG TAB] 60 tablet     Sig: TAKE 1 TABLET BY MOUTH 2 TIMES DAILY BETWEEN MEALS AS NEEDED FOR MODERATE PAIN (PAIN SCORE 4 TO 6)     Not Delegated - Analgesics:  Opioid Agonist Combinations Failed - 12/31/2023  7:51 AM      Failed - This refill cannot be delegated      Failed - Urine Drug Screen completed in last 360 days      Passed - Valid encounter within last 3 months    Recent Outpatient Visits           2 months ago Encounter for annual wellness visit (AWV) in Medicare patient   [place] [facility] [place], Marzella Schlein, MD   6 months ago Hypertension associated with diabetes [facility])   [place] [facility] [place], Marzella Schlein, MD   8 months ago Hypertension associated with diabetes [facility])   [place] [facility] [place], Marzella Schlein, MD   9 months ago Dizziness   [place] [facility] Merita Norton T, FNP   9 months ago Type 2 diabetes mellitus with hyperglycemia, without long-term current use of insulin [facility])   [place] [facility] [place], Marzella Schlein, MD       Future Appointments             In 2 weeks Bacigalupo, Marzella Schlein, MD [facility], [facility]

## 2023-12-31 NOTE — Telephone Encounter (Signed)
 Requested medication (s) are due for refill today: Yes  Requested medication (s) are on the active medication list: Yes  Last refill:  11/22/23  Future visit scheduled: Yes  Notes to clinic:  Not delegated.    Requested Prescriptions  Pending Prescriptions Disp Refills   HYDROcodone-acetaminophen (NORCO/VICODIN) 5-325 MG tablet [Pharmacy Med Name: HYDROCODONE-APAP 5-325 MG TAB] 60 tablet     Sig: TAKE 1 TABLET BY MOUTH 2 TIMES DAILY BETWEEN MEALS AS NEEDED FOR MODERATE PAIN (PAIN SCORE 4 TO 6)     Not Delegated - Analgesics:  Opioid Agonist Combinations Failed - 12/31/2023  7:49 AM      Failed - This refill cannot be delegated      Failed - Urine Drug Screen completed in last 360 days      Passed - Valid encounter within last 3 months    Recent Outpatient Visits           2 months ago Encounter for annual wellness visit (AWV) in Medicare patient   [place] [facility] [place], Marzella Schlein, MD   6 months ago Hypertension associated with diabetes [facility])   [place] [facility] [place], Marzella Schlein, MD   8 months ago Hypertension associated with diabetes [facility])   [place] [facility] [place], Marzella Schlein, MD   9 months ago Dizziness   [place] [facility] Merita Norton T, FNP   9 months ago Type 2 diabetes mellitus with hyperglycemia, without long-term current use of insulin [facility])   [place] [facility] [place], Marzella Schlein, MD       Future Appointments             In 2 weeks Bacigalupo, Marzella Schlein, MD [facility], [facility]

## 2024-01-01 ENCOUNTER — Telehealth: Payer: Self-pay

## 2024-01-01 NOTE — Telephone Encounter (Signed)
 Does not qualify  Lung Cancer Screening Narrative/Criteria Questionnaire (Cigarette Smokers Only- No Cigars/Pipes/vapes)   Alecia Lemming   SDMV:N/A      Alphonsa Gin, RN   12/04/1950   LDCT: N/A    73 y.o.   Phone: [phone]  Lung Screening Narrative (confirm age 9-77 yrs Medicare / 50-80 yrs Private pay insurance)   Insurance information: HTA   Referring Provider: Beryle Flock   This screening involves an initial phone call with a team member from our program. It is called a shared decision making visit. The initial meeting is required by  insurance and Medicare to make sure you understand the program. This appointment takes about 15-20 minutes to complete. You will complete the screening scan at your scheduled date/time.  This scan takes about 5-10 minutes to complete. You can eat and drink normally before and after the scan.  Criteria questions for Lung Cancer Screening:   Are you a current or former smoker? Former Age began smoking: 17   If you are a former smoker, what year did you quit smoking? Quit 2022 (within 15 yrs)   To calculate your smoking history, I need an accurate estimate of how many packs of cigarettes you smoked per day and for how many years. (Not just the number of PPD you are now smoking)   Years smoking 55 x Packs per day .25 = Pack years 13.75   (at least 20 pack yrs)   Does not qualify due to CMS guidelines.

## 2024-01-09 ENCOUNTER — Other Ambulatory Visit: Payer: Self-pay | Admitting: Family Medicine

## 2024-01-09 DIAGNOSIS — E1169 Type 2 diabetes mellitus with other specified complication: Secondary | ICD-10-CM

## 2024-01-09 DIAGNOSIS — I2510 Atherosclerotic heart disease of native coronary artery without angina pectoris: Secondary | ICD-10-CM

## 2024-01-10 NOTE — Telephone Encounter (Signed)
 Requested Prescriptions  Pending Prescriptions Disp Refills   atorvastatin (LIPITOR) 80 MG tablet [Pharmacy Med Name: ATORVASTATIN CALCIUM 80 MG TAB] 90 tablet 0    Sig: TAKE 1 TABLET BY MOUTH DAILY     Cardiovascular:  Antilipid - Statins Failed - 01/10/2024  2:34 PM      Failed - Lipid Panel in normal range within the last 12 months    Cholesterol, Total  Date Value Ref Range Status  06/26/2023 97 (L) 100 - 199 mg/dL Final   LDL Chol Calc (NIH)  Date Value Ref Range Status  06/26/2023 48 0 - 99 mg/dL Final   HDL  Date Value Ref Range Status  06/26/2023 31 (L) >39 mg/dL Final   Triglycerides  Date Value Ref Range Status  06/26/2023 93 0 - 149 mg/dL Final         Passed - Patient is not pregnant      Passed - Valid encounter within last 12 months    Recent Outpatient Visits           3 months ago Encounter for annual wellness visit (AWV) in Medicare patient   [place] [facility] [place], Marzella Schlein, MD   6 months ago Hypertension associated with diabetes [facility])   [place] [facility] [place], Marzella Schlein, MD   8 months ago Hypertension associated with diabetes [facility])   [place] [facility] [place], Marzella Schlein, MD   9 months ago Dizziness   [place] Merita Norton T, FNP   9 months ago Type 2 diabetes mellitus with hyperglycemia, without long-term current use of insulin (HCC)   [place] [facility], Marzella Schlein, MD       Future Appointments             In 2 weeks Bacigalupo, Marzella Schlein, MD [facility], [facility]             fenofibrate (TRICOR) 145 MG tablet [Pharmacy Med Name: FENOFIBRATE 145 MG TAB] 90 tablet 0    Sig: TAKE 1 TABLET BY MOUTH DAILY     Cardiovascular:  Antilipid - Fibric Acid Derivatives Failed - 01/10/2024  2:34 PM      Failed - HGB in normal range and within 360 days    Hemoglobin  Date Value Ref Range Status   12/19/2022 14.3 13.0 - 17.7 g/dL Final         Failed - HCT in normal range and within 360 days    Hematocrit  Date Value Ref Range Status  12/19/2022 43.1 37.5 - 51.0 % Final         Failed - PLT in normal range and within 360 days    Platelets  Date Value Ref Range Status  12/19/2022 254 150 - 450 x10E3/uL Final         Failed - WBC in normal range and within 360 days    WBC  Date Value Ref Range Status  12/19/2022 7.2 3.4 - 10.8 x10E3/uL Final  10/21/2021 7.2 4.0 - 10.5 K/uL Final         Failed - Lipid Panel in normal range within the last 12 months    Cholesterol, Total  Date Value Ref Range Status  06/26/2023 97 (L) 100 - 199 mg/dL Final   LDL Chol Calc (NIH)  Date Value Ref Range Status  06/26/2023 48 0 - 99 mg/dL Final   HDL  Date Value Ref Range Status  06/26/2023 31 (L) >39 mg/dL Final   Triglycerides  Date Value Ref Range Status  06/26/2023 93 0 - 149 mg/dL Final         Passed - ALT in normal range and within 360 days    ALT  Date Value Ref Range Status  10/08/2023 20 0 - 44 IU/L Final         Passed - AST in normal range and within 360 days    AST  Date Value Ref Range Status  10/08/2023 18 0 - 40 IU/L Final         Passed - Cr in normal range and within 360 days    Creatinine, Ser  Date Value Ref Range Status  10/08/2023 1.07 0.76 - 1.27 mg/dL Final   Creatinine, Urine  Date Value Ref Range Status  03/01/2023 110  Final         Passed - eGFR is 30 or above and within 360 days    GFR calc Af Amer  Date Value Ref Range Status  08/09/2020 82 >59 mL/min/1.73 Final    Comment:    **In accordance with recommendations from the NKF-ASN Task force,**   [facility] is in the process of updating its eGFR calculation to the   2021 CKD-EPI creatinine equation that estimates kidney function   without a race variable.    GFR, Estimated  Date Value Ref Range Status  10/21/2021 56 (L) >60 mL/min Final    Comment:    (NOTE) Calculated using the  CKD-EPI Creatinine Equation (2021)    eGFR  Date Value Ref Range Status  10/08/2023 73 >59 mL/min/1.73 Final         Passed - Valid encounter within last 12 months    Recent Outpatient Visits           3 months ago Encounter for annual wellness visit (AWV) in Medicare patient   [place] [facility] [place], Marzella Schlein, MD   6 months ago Hypertension associated with diabetes [facility])   [place] [facility] [place], Marzella Schlein, MD   8 months ago Hypertension associated with diabetes [facility])   [place] [facility] [place], Marzella Schlein, MD   9 months ago Dizziness   [place] [facility] Merita Norton T, FNP   9 months ago Type 2 diabetes mellitus with hyperglycemia, without long-term current use of insulin [facility])   [place] [facility] [place], Marzella Schlein, MD       Future Appointments             In 2 weeks Bacigalupo, Marzella Schlein, MD [facility], [facility]             metoprolol tartrate (LOPRESSOR) 50 MG tablet [Pharmacy Med Name: METOPROLOL TARTRATE 50 MG TAB] 180 tablet 0    Sig: TAKE 1 TABLET BY MOUTH TWICE A DAY     Cardiovascular:  Beta Blockers Failed - 01/10/2024  2:34 PM      Failed - Last BP in normal range    BP Readings from Last 1 Encounters:  10/08/23 (!) 150/80         Passed - Last Heart Rate in normal range    Pulse Readings from Last 1 Encounters:  10/08/23 77         Passed - Valid encounter within last 6 months    Recent Outpatient Visits           3 months ago  Encounter for annual wellness visit (AWV) in Medicare patient   [place] [facility] [place], Marzella Schlein, MD   6 months ago Hypertension associated with diabetes [facility])   [place] [facility] [place], Marzella Schlein, MD   8 months ago Hypertension associated with diabetes [facility])   [place] [facility] [place], Marzella Schlein, MD   9  months ago Dizziness   [place] [facility] Merita Norton T, FNP   9 months ago Type 2 diabetes mellitus with hyperglycemia, without long-term current use of insulin (HCC)   [facility] [place], Marzella Schlein, MD       Future Appointments             In 2 weeks Bacigalupo, Marzella Schlein, MD [facility], [facility]             empagliflozin (JARDIANCE) 25 MG TABS tablet [Pharmacy Med Name: JARDIANCE 25 MG TAB] 90 tablet 0    Sig: TAKE 1 TABLET BY MOUTH DAILY     Endocrinology:  Diabetes - SGLT2 Inhibitors Passed - 01/10/2024  2:34 PM      Passed - Cr in normal range and within 360 days    Creatinine, Ser  Date Value Ref Range Status  10/08/2023 1.07 0.76 - 1.27 mg/dL Final   Creatinine, Urine  Date Value Ref Range Status  03/01/2023 110  Final         Passed - HBA1C is between 0 and 7.9 and within 180 days    Hemoglobin A1C  Date Value Ref Range Status  07/18/2021 8.1  Final   Hgb A1c MFr Bld  Date Value Ref Range Status  10/08/2023 7.4 (H) 4.8 - 5.6 % Final    Comment:             Prediabetes: 5.7 - 6.4          Diabetes: >6.4          Glycemic control for adults with diabetes: <7.0          Passed - eGFR in normal range and within 360 days    GFR calc Af Amer  Date Value Ref Range Status  08/09/2020 82 >59 mL/min/1.73 Final    Comment:    **In accordance with recommendations from the NKF-ASN Task force,**   [facility] is in the process of updating its eGFR calculation to the   2021 CKD-EPI creatinine equation that estimates kidney function   without a race variable.    GFR, Estimated  Date Value Ref Range Status  10/21/2021 56 (L) >60 mL/min Final    Comment:    (NOTE) Calculated using the CKD-EPI Creatinine Equation (2021)    eGFR  Date Value Ref Range Status  10/08/2023 73 >59 mL/min/1.73 Final         Passed - Valid encounter within last 6 months    Recent Outpatient Visits           3  months ago Encounter for annual wellness visit (AWV) in Medicare patient   [place] [facility] [place], Marzella Schlein, MD   6 months ago Hypertension associated with diabetes [facility])   [place] [facility] Er[facility] Downer, MD   8 months ago Hypertension associated with diabetes [facility])    [facility] [place], Marzella Schlein, MD   9 months ago Dizziness   [facility] [place], [place]   9  months ago Type 2 diabetes mellitus with hyperglycemia, without long-term current use of insulin [facility])   [place] [facility], Marzella Schlein, MD       Future Appointments             In 2 weeks Bacigalupo, Marzella Schlein, MD [facility], [facility]

## 2024-01-17 ENCOUNTER — Telehealth: Payer: Self-pay | Admitting: Pharmacy Technician

## 2024-01-17 ENCOUNTER — Other Ambulatory Visit (HOSPITAL_COMMUNITY): Payer: Self-pay

## 2024-01-17 ENCOUNTER — Ambulatory Visit: Payer: Self-pay | Admitting: Family Medicine

## 2024-01-17 NOTE — Telephone Encounter (Signed)
 Pharmacy Patient Advocate Encounter   Received notification from CoverMyMeds that prior authorization for Ozempic (2 MG/DOSE) 8MG /3ML pen-injectors is required/requested.   Insurance verification completed.   The patient is insured through [facility] ADVANTAGE/RX ADVANCE .   Per test claim: PA required; PA submitted to above mentioned insurance via CoverMyMeds Key/confirmation #/EOC [id number] Status is pending

## 2024-01-17 NOTE — Telephone Encounter (Signed)
 Pharmacy Patient Advocate Encounter  Received notification from [facility] ADVANTAGE/RX ADVANCE that Prior Authorization for Ozempic (2 MG/DOSE) 8MG /3ML pen-injectors has been APPROVED from 01/17/2024 to 01/16/2025. Ran test claim, Copay is $12.15. This test claim was processed through [facility]- copay amounts may vary at other pharmacies due to pharmacy/plan contracts, or as the patient moves through the different stages of their insurance plan.   PA #/Case ID/Reference #: Key: [id number]

## 2024-01-29 ENCOUNTER — Other Ambulatory Visit: Payer: Self-pay | Admitting: Family Medicine

## 2024-01-29 ENCOUNTER — Ambulatory Visit: Admitting: Family Medicine

## 2024-02-06 ENCOUNTER — Ambulatory Visit (INDEPENDENT_AMBULATORY_CARE_PROVIDER_SITE_OTHER): Payer: Self-pay

## 2024-02-06 DIAGNOSIS — Z7984 Long term (current) use of oral hypoglycemic drugs: Secondary | ICD-10-CM | POA: Diagnosis not present

## 2024-02-06 DIAGNOSIS — E1159 Type 2 diabetes mellitus with other circulatory complications: Secondary | ICD-10-CM | POA: Diagnosis not present

## 2024-02-06 DIAGNOSIS — I152 Hypertension secondary to endocrine disorders: Secondary | ICD-10-CM | POA: Diagnosis not present

## 2024-02-06 DIAGNOSIS — Z0000 Encounter for general adult medical examination without abnormal findings: Secondary | ICD-10-CM

## 2024-02-06 DIAGNOSIS — Z0001 Encounter for general adult medical examination with abnormal findings: Secondary | ICD-10-CM

## 2024-02-06 DIAGNOSIS — Z1211 Encounter for screening for malignant neoplasm of colon: Secondary | ICD-10-CM

## 2024-02-06 NOTE — Patient Instructions (Addendum)
 [name] , Thank you for taking time to come for your Medicare Wellness Visit. I appreciate your ongoing commitment to your health goals. Please review the following plan we discussed and let me know if I can assist you in the future.   Referrals/Orders/Follow-Ups/Clinician Recommendations: COLOGUARD ORDERED; LABS ORDERED  This is a list of the screening recommended for you and due dates:  Health Maintenance  Topic Date Due   DTaP/Tdap/Td vaccine (2 - Td or Tdap) 09/27/2022   COVID-19 Vaccine (4 - 2024-25 season) 06/24/2023   Eye exam for diabetics  12/07/2023   Yearly kidney health urinalysis for diabetes  02/29/2024   Hemoglobin A1C  04/07/2024   Cologuard (Stool DNA test)  04/15/2024   Flu Shot  05/23/2024   Yearly kidney function blood test for diabetes  10/07/2024   Complete foot exam   10/07/2024   Medicare Annual Wellness Visit  02/05/2025   Pneumonia Vaccine  Completed   Hepatitis C Screening  Completed   Zoster (Shingles) Vaccine  Completed   HPV Vaccine  Aged Out   Meningitis B Vaccine  Aged Out   Colon Cancer Screening  Discontinued    Advanced directives: (In Chart) A copy of your advanced directives are scanned into your chart should your provider ever need it.  Next Medicare Annual Wellness Visit scheduled for next year: Yes  02/11/25 @ 1:10 PM BY PHONE

## 2024-02-06 NOTE — Progress Notes (Signed)
 Subjective:   [name] is a 73 y.o. who presents for a Medicare Wellness preventive visit.  Visit Complete: Virtual I connected with  KENYATTA KEIDEL on 02/06/24 by a audio enabled telemedicine application and verified that I am speaking with the correct person using two identifiers.  Patient Location: Home  Provider Location: Home Office  I discussed the limitations of evaluation and management by telemedicine. The patient expressed understanding and agreed to proceed.  Vital Signs: Because this visit was a virtual/telehealth visit, some criteria may be missing or patient reported. Any vitals not documented were not able to be obtained and vitals that have been documented are patient reported.  VideoDeclined- This patient declined Librarian, academic. Therefore the visit was completed with audio only.  Persons Participating in Visit: Patient.  AWV Questionnaire: No: Patient Medicare AWV questionnaire was not completed prior to this visit.  Cardiac Risk Factors include: advanced age (>71men, >52 women);diabetes mellitus;hypertension;dyslipidemia     Objective:    There were no vitals filed for this visit. There is no height or weight on file to calculate BMI.     02/06/2024    1:56 PM 02/05/2023    1:46 PM 01/31/2022    2:16 PM 11/01/2021   12:59 PM 10/20/2021    3:39 PM 01/25/2021    2:44 PM 01/20/2020    9:51 AM  Advanced Directives  Does Patient Have a Medical Advance Directive? Yes Yes Yes Yes Yes Yes Yes  Type of Estate agent of [place];Living will  Healthcare Power of [employer] Power of [employer] Power of [employer] Power of [place];Living will Healthcare Power of [place];Living will  Does patient want to make changes to medical advance directive? No - Patient declined  Yes (Inpatient - patient defers changing a medical advance directive and declines information at this time) No - Patient  declined     Copy of Healthcare Power of Attorney in Chart? Yes - validated most recent copy scanned in chart (See row information)  No - copy requested Yes - validated most recent copy scanned in chart (See row information) Yes - validated most recent copy scanned in chart (See row information) Yes - validated most recent copy scanned in chart (See row information) Yes - validated most recent copy scanned in chart (See row information)  Would patient like information on creating a medical advance directive?    No - Patient declined       Current Medications (verified) Outpatient Encounter Medications as of 02/06/2024  Medication Sig   acetaminophen (TYLENOL) 500 MG tablet Take 1,000 mg by mouth every 6 (six) hours as needed for mild pain or moderate pain.   amLODipine (NORVASC) 10 MG tablet Take 1 tablet (10 mg total) by mouth daily.   aspirin 81 MG tablet Take 81 mg by mouth daily.    atorvastatin (LIPITOR) 80 MG tablet TAKE 1 TABLET BY MOUTH DAILY   blood glucose meter kit and supplies KIT Dispense based on patient and insurance preference. Check sugars once daily   Cholecalciferol (VITAMIN D3 PO) Take 1 tablet by mouth daily.   clopidogrel (PLAVIX) 75 MG tablet TAKE 1 TABLET BY MOUTH DAILY   DULoxetine (CYMBALTA) 20 MG capsule TAKE 1 CAPSULE BY MOUTH DAILY   empagliflozin (JARDIANCE) 25 MG TABS tablet TAKE 1 TABLET BY MOUTH DAILY   ezetimibe (ZETIA) 10 MG tablet TAKE 1 TABLET BY MOUTH DAILY   fenofibrate (TRICOR) 145 MG tablet TAKE 1 TABLET  BY MOUTH DAILY   glucose blood (ONETOUCH VERIO) test strip TEST BLOOD GLUCOSE EVERY DAY   HYDROcodone-acetaminophen (NORCO/VICODIN) 5-325 MG tablet Take 1 tablet by mouth 2 (two) times daily between meals as needed for moderate pain (pain score 4-6).   Lidocaine 4 % PTCH Apply 1 patch topically daily as needed (pain).   Liniments (BEN GAY EX) Apply 1 application topically daily as needed (pain).   lisinopril (ZESTRIL) 40 MG tablet TAKE 1 TABLET BY MOUTH  DAILY   metFORMIN (GLUCOPHAGE) 1000 MG tablet TAKE 1 TABLET BY MOUTH 2 TIMES DAILY WITH A MEAL   metoprolol tartrate (LOPRESSOR) 50 MG tablet TAKE 1 TABLET BY MOUTH TWICE A DAY   OZEMPIC, 2 MG/DOSE, 8 MG/3ML SOPN INJECT 2MG  SUBCUTANEOUSLY AS DIRECTED ONCE A WEEK   pantoprazole (PROTONIX) 40 MG tablet TAKE 1 TABLET BY MOUTH DAILY   tamsulosin (FLOMAX) 0.4 MG CAPS capsule TAKE 1 CAPSULE BY MOUTH DAILY   No facility-administered encounter medications on file as of 02/06/2024.    Allergies (verified) Amoxicillin, Fexofenadine, and Penicillins   History: Past Medical History:  Diagnosis Date   Arthritis    back   Cataracts, bilateral    a.) s/p extractions in 2018   Coronary artery disease 04/08/2008   a.)  LHC 04/08/2008: EF 48%; 75% stenosis mRCA --> PCI performed placing a 3.5 x 18 mm Xience V DES x1.   Depression    GERD (gastroesophageal reflux disease)    Glaucoma    Hyperlipidemia    Hypertension    Stage 3b chronic kidney disease (HCC)    T2DM (type 2 diabetes mellitus) (HCC)    Past Surgical History:  Procedure Laterality Date   CAROTID STENT     CARPOMETACARPAL ([facility]) FUSION OF THUMB Right 11/01/2021   Procedure: CARPOMETACARPAL [facility]) FUSION OF THUMB;  Surgeon: Kennedy Bucker, MD;  Location: [facility] ORS;  Service: Orthopedics;  Laterality: Right;   CATARACT EXTRACTION W/PHACO Right 03/01/2017   Procedure: CATARACT EXTRACTION PHACO AND INTRAOCULAR LENS PLACEMENT (IOC);  Surgeon: Nevada Crane, MD;  Location: [facility] ORS;  Service: Ophthalmology;  Laterality: Right;  [place] 22.6 AP% 6.1 CDE 1.37 Fluid pack lot # [id number]   CATARACT EXTRACTION W/PHACO Left 05/01/2017   Procedure: CATARACT EXTRACTION PHACO AND INTRAOCULAR LENS PLACEMENT (IOC)  Left Diabetic;  Surgeon: Nevada Crane, MD;  Location[facility];  Service: Ophthalmology;  Laterality: Left;  Block Diabetic   CORONARY ANGIOPLASTY WITH STENT PLACEMENT Left 04/08/2008   Procedure: CORONARY ANGIOPLASTY WITH  STENT PLACEMENT (3.5 x 18 mm Xience V DES x1 to mRCA); Location: [facility]; Surgeon: Renato Battles, MD   POSTERIOR CERVICAL LAMINECTOMY N/A 02/25/2018   Procedure: POSTERIOR CERVICAL LAMINECTOMY-CERVICAL 3;  Surgeon: Ninfa Meeker, MD;  Location: [facility] ORS;  Service: Neurosurgery;  Laterality: N/A;   Family History  Problem Relation Age of Onset   Diabetes Mother        mellitus type2   Social History   Socioeconomic History   Marital status: Divorced    Spouse name: Not on file   Number of children: 2   Years of education: Not on file   Highest education level: 9th grade  Occupational History   Occupation: retired  Tobacco Use   Smoking status: Former    Types: Cigars    Quit date: 05/06/2020    Years since quitting: 3.7   Smokeless tobacco: Never   Tobacco comments:    smokes cigars-about 10-15 cigars a week, quit cigarettes 20+ years ago  Vaping Use   Vaping status: Never Used  Substance and Sexual Activity   Alcohol use: No   Drug use: No   Sexual activity: Not on file  Other Topics Concern   Not on file  Social History Narrative   Lives alone, illiterate, doesn't drive   Daughter, Maida Sciara has power of attorney   Social Drivers of Health   Financial Resource Strain: Low Risk  (02/06/2024)   Overall Financial Resource Strain (CARDIA)    Difficulty of Paying Living Expenses: Not hard at all  Food Insecurity: No Food Insecurity (02/06/2024)   Hunger Vital Sign    Worried About Running Out of Food in the Last Year: Never true    Ran Out of Food in the Last Year: Never true  Transportation Needs: No Transportation Needs (02/06/2024)   PRAPARE - Administrator, Civil Service (Medical): No    Lack of Transportation (Non-Medical): No  Physical Activity: Sufficiently Active (02/06/2024)   Exercise Vital Sign    Days of Exercise per Week: 7 days    Minutes of Exercise per Session: 30 min  Stress: No Stress Concern Present (02/06/2024)   [employer] of  Occupational Health - Occupational Stress Questionnaire    Feeling of Stress : Not at all  Social Connections: Moderately Isolated (02/06/2024)   Social Connection and Isolation Panel [NHANES]    Frequency of Communication with Friends and Family: More than three times a week    Frequency of Social Gatherings with Friends and Family: More than three times a week    Attends Religious Services: More than 4 times per year    Active Member of [employer] or Organizations: No    Attends Engineer, structural: Never    Marital Status: Divorced    Tobacco Counseling Counseling given: Not Answered Tobacco comments: smokes cigars-about 10-15 cigars a week, quit cigarettes 20+ years ago    Clinical Intake:  Pre-visit preparation completed: Yes  Pain : No/denies pain     BMI - recorded: 26.4 Nutritional Status: BMI 25 -29 Overweight Diabetes: Yes CBG done?: No Did pt. bring in CBG monitor from home?: No  Lab Results  Component Value Date   HGBA1C 7.4 (H) 10/08/2023   HGBA1C 6.9 (H) 06/26/2023   HGBA1C 8.1 (A) 03/22/2023     How often do you need to have someone help you when you read instructions, pamphlets, or other written materials from your doctor or pharmacy?: 1 - Never  Interpreter Needed?: No  Information entered by :: Dellie Fergusson, LPN   Activities of Daily Living    02/06/2024    1:57 PM 04/05/2023    1:50 PM  In your present state of health, do you have any difficulty performing the following activities:  Hearing? 0 0  Vision? 0 1  Difficulty concentrating or making decisions? 0 0  Walking or climbing stairs? 0 1  Dressing or bathing? 0 0  Doing errands, shopping? 0 0  Preparing Food and eating ? N   Using the Toilet? N   In the past six months, have you accidently leaked urine? N   Do you have problems with loss of bowel control? N   Managing your Medications? Y   Managing your Finances? Y   Housekeeping or managing your Housekeeping? N     Patient  Care Team: Mazie Speed, MD as PCP - General (Family Medicine) Percival Brace, MD as Consulting Physician (Cardiology) Rosa College, MD as Consulting Physician (Ophthalmology)  Leanora Ivanoff, PA-C  Indicate any recent Medical Services you may have received from other than [facility] providers in the past year (date may be approximate).     Assessment:   This is a routine wellness examination for [place].  Hearing/Vision screen Hearing Screening - Comments:: NO AIDS Vision Screening - Comments:: NO GLASSES- DR.KING   Goals Addressed             This Visit's Progress    DIET - INCREASE WATER INTAKE         Depression Screen     02/06/2024    1:54 PM 10/08/2023   10:04 AM 06/26/2023    9:21 AM 04/05/2023    1:49 PM 03/22/2023   10:20 AM 02/05/2023    1:42 PM 12/19/2022   10:20 AM  PHQ 2/9 Scores  PHQ - 2 Score 0 0 0 0 0 0 0  PHQ- 9 Score 0  3 0 6 0 0    Fall Risk     02/06/2024    1:57 PM 06/26/2023    9:20 AM 04/05/2023    1:49 PM 03/22/2023   10:20 AM 02/05/2023    1:40 PM  Fall Risk   Falls in the past year? 1 0 0 0 0  Number falls in past yr: 0 0 0 0 0  Injury with Fall? 0 0 0 0 0  Risk for fall due to :  No Fall Risks  No Fall Risks No Fall Risks  Follow up Falls prevention discussed;Falls evaluation completed Falls evaluation completed   Education provided;Falls prevention discussed    MEDICARE RISK AT HOME:  Medicare Risk at Home Any stairs in or around the home?: Yes If so, are there any without handrails?: No Home free of loose throw rugs in walkways, pet beds, electrical cords, etc?: Yes Adequate lighting in your home to reduce risk of falls?: Yes Life alert?: No Use of a cane, walker or w/c?: Yes (CANE WHEN LOTS OF WALKING) Grab bars in the bathroom?: No Shower chair or bench in shower?: Yes (HAS, DOESN'T USE) Elevated toilet seat or a handicapped toilet?: No  TIMED UP AND GO:  Was the test performed?  No  Cognitive Function: 6CIT  completed        02/06/2024    1:59 PM 02/05/2023    1:47 PM 01/20/2020    9:55 AM  6CIT Screen  What Year? 0 points 0 points 0 points  What month? 0 points 0 points 0 points  What time? 0 points 0 points 0 points  Count back from 20 4 points 0 points 2 points  Months in reverse 0 points 2 points 2 points  Repeat phrase 2 points 0 points 6 points  Total Score 6 points 2 points 10 points    Immunizations Immunization History  Administered Date(s) Administered   Fluad Quad(high Dose 65+) 08/04/2019, 08/09/2020, 09/02/2021, 09/18/2022   Fluad Trivalent(High Dose 65+) 06/26/2023   Influenza, High Dose Seasonal PF 09/06/2015, 09/08/2016, 09/21/2017, 06/26/2018   Influenza-Unspecified 06/23/2014   PFIZER(Purple Top)SARS-COV-2 Vaccination 12/12/2019, 01/02/2020, 08/19/2020   Pneumococcal Conjugate-13 06/04/2015   Pneumococcal Polysaccharide-23 09/21/2017   Respiratory Syncytial Virus Vaccine,Recomb Aduvanted(Arexvy) 09/27/2022   Tdap 09/27/2012   Zoster Recombinant(Shingrix) 08/09/2020, 07/22/2022   Zoster, Live 09/06/2015    Screening Tests Health Maintenance  Topic Date Due   DTaP/Tdap/Td (2 - Td or Tdap) 09/27/2022   COVID-19 Vaccine (4 - 2024-25 season) 06/24/2023   OPHTHALMOLOGY EXAM  12/07/2023   Diabetic kidney evaluation -  Urine ACR  02/29/2024   HEMOGLOBIN A1C  04/07/2024   Fecal DNA (Cologuard)  04/15/2024   INFLUENZA VACCINE  05/23/2024   Diabetic kidney evaluation - eGFR measurement  10/07/2024   FOOT EXAM  10/07/2024   Medicare Annual Wellness (AWV)  02/05/2025   Pneumonia Vaccine 19+ Years old  Completed   Hepatitis C Screening  Completed   Zoster Vaccines- Shingrix  Completed   HPV VACCINES  Aged Out   Meningococcal B Vaccine  Aged Out   Colonoscopy  Discontinued    Health Maintenance  Health Maintenance Due  Topic Date Due   DTaP/Tdap/Td (2 - Td or Tdap) 09/27/2022   COVID-19 Vaccine (4 - 2024-25 season) 06/24/2023   OPHTHALMOLOGY EXAM  12/07/2023    Diabetic kidney evaluation - Urine ACR  02/29/2024   Health Maintenance Items Addressed: Labs Ordered:DM- COLOGUARD ORDERED; NEEDS EYE EXAM; UP TO DATE ON FOOT EXAM; NEEDS ANOTHER PNA SHOT, TDAP  Additional Screening:  Vision Screening: Recommended annual ophthalmology exams for early detection of glaucoma and other disorders of the eye.  Dental Screening: Recommended annual dental exams for proper oral hygiene  Community Resource Referral / Chronic Care Management: CRR required this visit?  No   CCM required this visit?  No     Plan:     I have personally reviewed and noted the following in the patient's chart:   Medical and social history Use of alcohol, tobacco or illicit drugs  Current medications and supplements including opioid prescriptions. Patient is not currently taking opioid prescriptions. Functional ability and status Nutritional status Physical activity Advanced directives List of other physicians Hospitalizations, surgeries, and ER visits in previous 12 months Vitals Screenings to include cognitive, depression, and falls Referrals and appointments  In addition, I have reviewed and discussed with patient certain preventive protocols, quality metrics, and best practice recommendations. A written personalized care plan for preventive services as well as general preventive health recommendations were provided to patient.     Pinky Bright, LPN   1/61/0960   After Visit Summary: (MyChart) Due to this being a telephonic visit, the after visit summary with patients personalized plan was offered to patient via MyChart   Notes:  DM LABS ORDERED; ORDERED COLOGUARD

## 2024-02-14 DIAGNOSIS — H26491 Other secondary cataract, right eye: Secondary | ICD-10-CM | POA: Diagnosis not present

## 2024-02-14 DIAGNOSIS — E119 Type 2 diabetes mellitus without complications: Secondary | ICD-10-CM | POA: Diagnosis not present

## 2024-02-14 DIAGNOSIS — Z961 Presence of intraocular lens: Secondary | ICD-10-CM | POA: Diagnosis not present

## 2024-02-14 DIAGNOSIS — H31013 Macula scars of posterior pole (postinflammatory) (post-traumatic), bilateral: Secondary | ICD-10-CM | POA: Diagnosis not present

## 2024-02-14 DIAGNOSIS — H5500 Unspecified nystagmus: Secondary | ICD-10-CM | POA: Diagnosis not present

## 2024-02-14 LAB — HM DIABETES EYE EXAM

## 2024-02-15 ENCOUNTER — Other Ambulatory Visit: Payer: Self-pay | Admitting: Family Medicine

## 2024-02-15 DIAGNOSIS — E1165 Type 2 diabetes mellitus with hyperglycemia: Secondary | ICD-10-CM

## 2024-02-15 NOTE — Telephone Encounter (Signed)
 Requested Prescriptions  Pending Prescriptions Disp Refills   OZEMPIC , 2 MG/DOSE, 8 MG/3ML SOPN [Pharmacy Med Name: OZEMPIC  (2 MG/DOSE) 8 MG/3ML SUBQ S] 3 mL 3    Sig: INJECT 2MG  SUBCUTANEOUSLY AS DIRECTED ONCE A WEEK     Endocrinology:  Diabetes - GLP-1 Receptor Agonists - semaglutide  Failed - 02/15/2024  3:45 PM      Failed - HBA1C in normal range and within 180 days    Hemoglobin A1C  Date Value Ref Range Status  07/18/2021 8.1  Final   Hgb A1c MFr Bld  Date Value Ref Range Status  10/08/2023 7.4 (H) 4.8 - 5.6 % Final    Comment:             Prediabetes: 5.7 - 6.4          Diabetes: >6.4          Glycemic control for adults with diabetes: <7.0          Passed - Cr in normal range and within 360 days    Creatinine, Ser  Date Value Ref Range Status  10/08/2023 1.07 0.76 - 1.27 mg/dL Final   Creatinine, Urine  Date Value Ref Range Status  03/01/2023 110  Final         Passed - Valid encounter within last 6 months    Recent Outpatient Visits   None

## 2024-02-18 ENCOUNTER — Other Ambulatory Visit: Payer: Self-pay | Admitting: Family Medicine

## 2024-02-18 DIAGNOSIS — K219 Gastro-esophageal reflux disease without esophagitis: Secondary | ICD-10-CM

## 2024-02-20 NOTE — Telephone Encounter (Signed)
 Requested Prescriptions  Pending Prescriptions Disp Refills   pantoprazole  (PROTONIX ) 40 MG tablet [Pharmacy Med Name: PANTOPRAZOLE  SODIUM 40 MG DR TAB] 90 tablet 0    Sig: TAKE 1 TABLET BY MOUTH DAILY     Gastroenterology: Proton Pump Inhibitors Passed - 02/20/2024  3:36 PM      Passed - Valid encounter within last 12 months    Recent Outpatient Visits   None             DULoxetine  (CYMBALTA ) 20 MG capsule [Pharmacy Med Name: DULOXETINE  HCL 20 MG CAP] 90 capsule 0    Sig: TAKE 1 CAPSULE BY MOUTH DAILY     Psychiatry: Antidepressants - SNRI - duloxetine  Failed - 02/20/2024  3:36 PM      Failed - Last BP in normal range    BP Readings from Last 1 Encounters:  10/08/23 (!) 150/80         Passed - Cr in normal range and within 360 days    Creatinine, Ser  Date Value Ref Range Status  10/08/2023 1.07 0.76 - 1.27 mg/dL Final   Creatinine, Urine  Date Value Ref Range Status  03/01/2023 110  Final         Passed - eGFR is 30 or above and within 360 days    GFR calc Af Amer  Date Value Ref Range Status  08/09/2020 82 >59 mL/min/1.73 Final    Comment:    **In accordance with recommendations from the NKF-ASN Task force,**   [facility] is in the process of updating its eGFR calculation to the   2021 CKD-EPI creatinine equation that estimates kidney function   without a race variable.    GFR, Estimated  Date Value Ref Range Status  10/21/2021 56 (L) >60 mL/min Final    Comment:    (NOTE) Calculated using the CKD-EPI Creatinine Equation (2021)    eGFR  Date Value Ref Range Status  10/08/2023 73 >59 mL/min/1.73 Final         Passed - Completed PHQ-2 or PHQ-9 in the last 360 days      Passed - Valid encounter within last 6 months    Recent Outpatient Visits   None

## 2024-02-21 ENCOUNTER — Ambulatory Visit (INDEPENDENT_AMBULATORY_CARE_PROVIDER_SITE_OTHER): Admitting: Family Medicine

## 2024-02-21 ENCOUNTER — Encounter: Payer: Self-pay | Admitting: Family Medicine

## 2024-02-21 VITALS — BP 121/72 | HR 76 | Ht 72.0 in | Wt 193.8 lb

## 2024-02-21 DIAGNOSIS — E785 Hyperlipidemia, unspecified: Secondary | ICD-10-CM | POA: Diagnosis not present

## 2024-02-21 DIAGNOSIS — I2510 Atherosclerotic heart disease of native coronary artery without angina pectoris: Secondary | ICD-10-CM

## 2024-02-21 DIAGNOSIS — I152 Hypertension secondary to endocrine disorders: Secondary | ICD-10-CM

## 2024-02-21 DIAGNOSIS — E1159 Type 2 diabetes mellitus with other circulatory complications: Secondary | ICD-10-CM | POA: Diagnosis not present

## 2024-02-21 DIAGNOSIS — E1169 Type 2 diabetes mellitus with other specified complication: Secondary | ICD-10-CM

## 2024-02-21 DIAGNOSIS — M47816 Spondylosis without myelopathy or radiculopathy, lumbar region: Secondary | ICD-10-CM | POA: Diagnosis not present

## 2024-02-21 DIAGNOSIS — N182 Chronic kidney disease, stage 2 (mild): Secondary | ICD-10-CM

## 2024-02-21 DIAGNOSIS — G894 Chronic pain syndrome: Secondary | ICD-10-CM | POA: Diagnosis not present

## 2024-02-21 MED ORDER — HYDROCODONE-ACETAMINOPHEN 5-325 MG PO TABS: 1.0000 | ORAL_TABLET | Freq: Two times a day (BID) | ORAL | 0 refills | Status: DC | PRN

## 2024-02-21 NOTE — Assessment & Plan Note (Signed)
 Managed with atorvastatin  and Zetia . Lipid levels will be assessed with upcoming labs. - Order cholesterol test

## 2024-02-21 NOTE — Assessment & Plan Note (Signed)
 Blood pressure is generally well-controlled with occasional elevations. He monitors blood pressure at home and adjusts activity accordingly. continue current meds

## 2024-02-21 NOTE — Progress Notes (Signed)
 Established patient visit   Patient: [name]   DOB: 1951-02-17   73 y.o. Male  MRN: [id number] Visit Date: 02/21/2024  Today's healthcare provider: Aden Agreste, MD   Chief Complaint  Patient presents with   Follow-up    Fyi- pt was denied for lung cancer screening he had been referred for, they told him he did not qualify   Diabetes    Pt has no concerns, reports sugar being 150 this morning, No limits on diet, Pt reports walking around regularly while working outside   Subjective    HPI HPI     Follow-up    Additional comments: Fyi- pt was denied for lung cancer screening he had been referred for, they told him he did not qualify        Diabetes    Additional comments: Pt has no concerns, reports sugar being 150 this morning, No limits on diet, Pt reports walking around regularly while working outside      Last edited by Gennaro Khat on 02/21/2024  9:03 AM.       Discussed the use of AI scribe software for clinical note transcription with the patient, who gave verbal consent to proceed.  History of Present Illness   [name] is a 73 year old male with type 2 diabetes, hypertension, hyperlipidemia, chronic pain syndrome, and CKD stage 2 who presents for chronic follow-up.  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  He experienced a fall in the past year without injury. He has difficulty with sleep, including trouble falling asleep, staying asleep, and waking early. He occasionally overeats but maintains a normal appetite. He sometimes thinks he'd be better off dead but lacks persistent self-harm thoughts. He denies tiredness, low energy,  poor self-esteem, concentration difficulties, psychomotor changes, or anxiety symptoms.  He has received all vaccinations except for tetanus, which he is verifying. His vision remains stable following a recent eye examination. He monitors his blood pressure at home, noting fluctuations, and manages it with walking and hydration.         Medications: Outpatient Medications Prior to Visit  Medication Sig   acetaminophen  (TYLENOL ) 500 MG tablet Take 1,000 mg by mouth every 6 (six) hours as needed for mild pain or moderate pain.   amLODipine  (NORVASC ) 10 MG tablet Take 1 tablet (10 mg total) by mouth daily.   aspirin 81 MG tablet Take 81 mg by mouth daily.    atorvastatin  (LIPITOR) 80 MG tablet TAKE 1 TABLET BY MOUTH DAILY   blood glucose meter kit and supplies KIT Dispense based on patient and insurance preference. Check sugars once daily   Cholecalciferol (VITAMIN D3 PO) Take 1 tablet by mouth daily.   clopidogrel  (PLAVIX ) 75 MG tablet TAKE 1 TABLET BY MOUTH DAILY   DULoxetine  (CYMBALTA ) 20 MG capsule TAKE 1 CAPSULE BY MOUTH DAILY   empagliflozin  (JARDIANCE ) 25 MG TABS tablet TAKE 1 TABLET BY MOUTH DAILY   ezetimibe  (ZETIA ) 10 MG tablet TAKE 1 TABLET BY MOUTH DAILY   fenofibrate  (TRICOR ) 145 MG tablet TAKE 1 TABLET BY MOUTH DAILY   glucose blood (ONETOUCH VERIO) test strip TEST BLOOD GLUCOSE EVERY DAY   Lidocaine  4 % PTCH Apply 1 patch topically daily as needed (pain).   Liniments (  BEN GAY EX) Apply 1 application topically daily as needed (pain).   lisinopril  (ZESTRIL ) 40 MG tablet TAKE 1 TABLET BY MOUTH DAILY   metFORMIN  (GLUCOPHAGE ) 1000 MG tablet TAKE 1 TABLET BY MOUTH 2 TIMES DAILY WITH A MEAL   metoprolol  tartrate (LOPRESSOR ) 50 MG tablet TAKE 1 TABLET BY MOUTH TWICE A DAY   OZEMPIC , 2 MG/DOSE, 8 MG/3ML SOPN INJECT 2MG  SUBCUTANEOUSLY AS DIRECTED ONCE A WEEK   pantoprazole  (PROTONIX ) 40 MG tablet TAKE 1 TABLET BY MOUTH DAILY   tamsulosin  (FLOMAX ) 0.4 MG CAPS capsule TAKE 1 CAPSULE BY  MOUTH DAILY   [DISCONTINUED] HYDROcodone -acetaminophen  (NORCO/VICODIN) 5-325 MG tablet Take 1 tablet by mouth 2 (two) times daily between meals as needed for moderate pain (pain score 4-6).   No facility-administered medications prior to visit.    Review of Systems     Objective    BP 121/72 Comment: home reading  Pulse 76   Ht 6' (1.829 m)   Wt 193 lb 12.8 oz (87.9 kg)   SpO2 98%   BMI 26.28 kg/m    Physical Exam Vitals reviewed.  Constitutional:      General: He is not in acute distress.    Appearance: Normal appearance. He is not diaphoretic.  HENT:     Head: Normocephalic and atraumatic.  Eyes:     General: No scleral icterus.    Conjunctiva/sclera: Conjunctivae normal.  Cardiovascular:     Rate and Rhythm: Normal rate and regular rhythm.     Heart sounds: Normal heart sounds. No murmur heard. Pulmonary:     Effort: Pulmonary effort is normal. No respiratory distress.     Breath sounds: Normal breath sounds. No wheezing or rhonchi.  Musculoskeletal:     Cervical back: Neck supple.     Right lower leg: No edema.     Left lower leg: No edema.  Lymphadenopathy:     Cervical: No cervical adenopathy.  Skin:    General: Skin is warm and dry.     Findings: No rash.  Neurological:     Mental Status: He is alert and oriented to person, place, and time. Mental status is at baseline.  Psychiatric:        Mood and Affect: Mood normal.        Behavior: Behavior normal.      No results found for any visits on 02/21/24.  Assessment & Plan     Problem List Items Addressed This Visit       Cardiovascular and Mediastinum   Hypertension associated with diabetes (HCC) - Primary   Blood pressure is generally well-controlled with occasional elevations. He monitors blood pressure at home and adjusts activity accordingly. continue current meds      Relevant Orders   Comprehensive metabolic panel with GFR   CAD in native artery   Managed with aspirin, Plavix , and  metoprolol . No acute issues reported.        Endocrine   Hyperlipidemia associated with type 2 diabetes mellitus (HCC)   Managed with atorvastatin  and Zetia . Lipid levels will be assessed with upcoming labs. - Order cholesterol test      Relevant Orders   Comprehensive metabolic panel with GFR   Lipid panel   T2DM (type 2 diabetes mellitus) (HCC)   Diabetes management is ongoing with metformin , Jardiance , and Ozempic . He reports regular monitoring and adherence to the medication regimen. - Order A1c test, UACR - continue current meds pending A1c result      Relevant Orders  Hemoglobin A1c   Microalbumin / creatinine urine ratio     Musculoskeletal and Integument   Spondylosis of lumbar region without myelopathy or radiculopathy   Managed with Norco as needed. Refill for hydrocodone  requested and will be provided. - Refill hydrocodone  prescription         Genitourinary   CKD (chronic kidney disease) stage 2, GFR 60-89 ml/min   Kidney function will be monitored with upcoming labs. - Order kidney function test - Order urine test      Relevant Orders   Comprehensive metabolic panel with GFR     Other   Chronic pain syndrome   Managed with Norco as needed. Refill for hydrocodone  requested and will be provided. - Refill hydrocodone  prescription       Relevant Medications   HYDROcodone -acetaminophen  (NORCO/VICODIN) 5-325 MG tablet      Return in about 7 months (around 09/22/2024) for CPE.       Aden Agreste, MD  [facility] [phone] (phone) [phone] (fax)  [facility]

## 2024-02-21 NOTE — Assessment & Plan Note (Signed)
 Kidney function will be monitored with upcoming labs. - Order kidney function test - Order urine test

## 2024-02-21 NOTE — Assessment & Plan Note (Signed)
 Diabetes management is ongoing with metformin , Jardiance , and Ozempic . He reports regular monitoring and adherence to the medication regimen. - Order A1c test, UACR - continue current meds pending A1c result

## 2024-02-21 NOTE — Assessment & Plan Note (Signed)
 Managed with aspirin, Plavix , and metoprolol . No acute issues reported.

## 2024-02-21 NOTE — Assessment & Plan Note (Signed)
 Managed with Norco as needed. Refill for hydrocodone  requested and will be provided. - Refill hydrocodone  prescription

## 2024-02-22 LAB — COMPREHENSIVE METABOLIC PANEL WITH GFR
ALT: 19 IU/L (ref 0–44)
AST: 16 IU/L (ref 0–40)
Albumin: 4.6 g/dL (ref 3.8–4.8)
Alkaline Phosphatase: 47 IU/L (ref 44–121)
BUN/Creatinine Ratio: 16 (ref 10–24)
BUN: 16 mg/dL (ref 8–27)
Bilirubin Total: 0.4 mg/dL (ref 0.0–1.2)
CO2: 19 mmol/L — ABNORMAL LOW (ref 20–29)
Calcium: 10.6 mg/dL — ABNORMAL HIGH (ref 8.6–10.2)
Chloride: 101 mmol/L (ref 96–106)
Creatinine, Ser: 0.99 mg/dL (ref 0.76–1.27)
Globulin, Total: 2.3 g/dL (ref 1.5–4.5)
Glucose: 161 mg/dL — ABNORMAL HIGH (ref 70–99)
Potassium: 4.3 mmol/L (ref 3.5–5.2)
Sodium: 138 mmol/L (ref 134–144)
Total Protein: 6.9 g/dL (ref 6.0–8.5)
eGFR: 80 mL/min/{1.73_m2} (ref >59)

## 2024-02-22 LAB — MICROALBUMIN / CREATININE URINE RATIO
Creatinine, Urine: 59.8 mg/dL
Microalb/Creat Ratio: 28 mg/g{creat} (ref 0–29)
Microalbumin, Urine: 16.9 ug/mL

## 2024-02-22 LAB — LIPID PANEL
Chol/HDL Ratio: 3.5 ratio (ref 0.0–5.0)
Cholesterol, Total: 104 mg/dL (ref 100–199)
HDL: 30 mg/dL — ABNORMAL LOW (ref >39)
LDL Chol Calc (NIH): 55 mg/dL (ref 0–99)
Triglycerides: 101 mg/dL (ref 0–149)
VLDL Cholesterol Cal: 19 mg/dL (ref 5–40)

## 2024-02-22 LAB — HEMOGLOBIN A1C
Est. average glucose Bld gHb Est-mCnc: 166 mg/dL
Hgb A1c MFr Bld: 7.4 % — ABNORMAL HIGH (ref 4.8–5.6)

## 2024-02-25 ENCOUNTER — Encounter: Payer: Self-pay | Admitting: Family Medicine

## 2024-03-03 ENCOUNTER — Other Ambulatory Visit: Payer: Self-pay | Admitting: Family Medicine

## 2024-03-04 ENCOUNTER — Telehealth: Payer: Self-pay

## 2024-03-04 NOTE — Telephone Encounter (Unsigned)
 Copied from CRM [id number]. Topic: Medical Record Request - Provider/Facility Request >> Mar 04, 2024 10:47 AM Ivette P wrote: Reason for CRM: Sky called in to veirfy the Diagnosis change to Access to care change for the pt.   Sky would like to know if the pt is diagnosed with Chronic heart Failure, Diabetes, or Cardiovascular disease  Would like for someone to call back and follow up    [phone]   Refrence #: [id number]

## 2024-03-06 NOTE — Telephone Encounter (Signed)
 Does have T2DM and CAD. No CHF.

## 2024-03-12 ENCOUNTER — Telehealth: Payer: Self-pay

## 2024-03-12 ENCOUNTER — Other Ambulatory Visit: Payer: Self-pay | Admitting: Family Medicine

## 2024-03-12 DIAGNOSIS — E1165 Type 2 diabetes mellitus with hyperglycemia: Secondary | ICD-10-CM

## 2024-03-12 NOTE — Telephone Encounter (Signed)
 [facility] [employer] is requesting refill OZEMPIC , 2 MG/DOSE, 8 MG/3ML SOPN  Please advise

## 2024-03-12 NOTE — Telephone Encounter (Signed)
 [name]

## 2024-03-13 ENCOUNTER — Telehealth: Payer: Self-pay

## 2024-03-13 ENCOUNTER — Other Ambulatory Visit (HOSPITAL_COMMUNITY): Payer: Self-pay

## 2024-03-13 NOTE — Telephone Encounter (Signed)
 Pharmacy Patient Advocate Encounter   Received notification from Onbase that prior authorization for Ozempic  (2 MG/DOSE) 8MG /3ML pen-injectors is required/requested.   Insurance verification completed.   The patient is insured through [employer] .   Per test claim: PA required; PA submitted to above mentioned insurance via CoverMyMeds Key/confirmation #/EOC [id number] Status is pending

## 2024-03-13 NOTE — Telephone Encounter (Signed)
 What am I missing here? Is there supposed to be a med refill attached to this?

## 2024-03-14 ENCOUNTER — Other Ambulatory Visit (HOSPITAL_COMMUNITY): Payer: Self-pay

## 2024-03-14 MED ORDER — OZEMPIC (2 MG/DOSE) 8 MG/3ML ~~LOC~~ SOPN: 2.0000 mg | PEN_INJECTOR | SUBCUTANEOUS | 3 refills | Status: DC

## 2024-03-14 NOTE — Telephone Encounter (Signed)
 Pharmacy Patient Advocate Encounter  Received notification from [facility] that Prior Authorization for Ozempic  (2 MG/DOSE) 8MG /3ML pen-injectors has been APPROVED from 03/13/24 to 10/22/24. Unable to obtain price due to refill too soon rejection, last fill date 03/13/24 next available fill date06/12/25   PA #/Case ID/Reference #:  [id number]

## 2024-03-17 ENCOUNTER — Other Ambulatory Visit: Payer: Self-pay | Admitting: Family Medicine

## 2024-03-28 ENCOUNTER — Other Ambulatory Visit: Payer: Self-pay | Admitting: Family Medicine

## 2024-03-31 NOTE — Telephone Encounter (Signed)
 Requested medication (s) are due for refill today: yes  Requested medication (s) are on the active medication list: yes  Last refill:  02/21/24  Future visit scheduled: yes  Notes to clinic:  Unable to refill per protocol, cannot delegate.      Requested Prescriptions  Pending Prescriptions Disp Refills   HYDROcodone -acetaminophen  (NORCO/VICODIN) 5-325 MG tablet [Pharmacy Med Name: HYDROCODONE -APAP 5-325 MG TAB] 60 tablet     Sig: TAKE 1 TABLET BY MOUTH TWICE A DAY BETWEEN MEALS AS NEEDED FOR MODERATE PAIN. (PAIN SCORE 4 TO 6)     Not Delegated - Analgesics:  Opioid Agonist Combinations Failed - 03/31/2024  8:45 AM      Failed - This refill cannot be delegated      Failed - Urine Drug Screen completed in last 360 days      Passed - Valid encounter within last 3 months    Recent Outpatient Visits           1 month ago Hypertension associated with diabetes [facility])   [facility] Bacigalupo, Stan Eans, MD       Future Appointments             In 5 months Bacigalupo, Stan Eans, MD [facility]

## 2024-04-01 ENCOUNTER — Other Ambulatory Visit: Payer: Self-pay | Admitting: Family Medicine

## 2024-04-01 DIAGNOSIS — I2510 Atherosclerotic heart disease of native coronary artery without angina pectoris: Secondary | ICD-10-CM

## 2024-04-01 DIAGNOSIS — E1169 Type 2 diabetes mellitus with other specified complication: Secondary | ICD-10-CM

## 2024-04-22 ENCOUNTER — Other Ambulatory Visit: Payer: Self-pay | Admitting: Family Medicine

## 2024-04-23 DIAGNOSIS — I10 Essential (primary) hypertension: Secondary | ICD-10-CM | POA: Diagnosis not present

## 2024-04-23 DIAGNOSIS — N1832 Chronic kidney disease, stage 3b: Secondary | ICD-10-CM | POA: Diagnosis not present

## 2024-04-23 DIAGNOSIS — E119 Type 2 diabetes mellitus without complications: Secondary | ICD-10-CM | POA: Diagnosis not present

## 2024-04-23 DIAGNOSIS — E785 Hyperlipidemia, unspecified: Secondary | ICD-10-CM | POA: Diagnosis not present

## 2024-04-23 DIAGNOSIS — Z955 Presence of coronary angioplasty implant and graft: Secondary | ICD-10-CM | POA: Diagnosis not present

## 2024-04-23 DIAGNOSIS — Z9889 Other specified postprocedural states: Secondary | ICD-10-CM | POA: Diagnosis not present

## 2024-04-23 DIAGNOSIS — I2510 Atherosclerotic heart disease of native coronary artery without angina pectoris: Secondary | ICD-10-CM | POA: Diagnosis not present

## 2024-05-08 DIAGNOSIS — Z1211 Encounter for screening for malignant neoplasm of colon: Secondary | ICD-10-CM | POA: Diagnosis not present

## 2024-05-12 ENCOUNTER — Other Ambulatory Visit: Payer: Self-pay | Admitting: Family Medicine

## 2024-05-12 DIAGNOSIS — K219 Gastro-esophageal reflux disease without esophagitis: Secondary | ICD-10-CM

## 2024-05-14 LAB — COLOGUARD: COLOGUARD: NEGATIVE

## 2024-05-15 ENCOUNTER — Ambulatory Visit: Payer: Self-pay | Admitting: Family Medicine

## 2024-05-29 ENCOUNTER — Other Ambulatory Visit: Payer: Self-pay | Admitting: Family Medicine

## 2024-06-02 NOTE — Progress Notes (Signed)
 Pharmacy Quality Measure Review  This patient is appearing on a report for being at risk of failing the adherence measure for diabetes medications this calendar year.   Medication: metformin  1000 mg Last fill date: 05/12/24 for 30 day supply  Medication: Ozempic  2 mg Last fill date: 05/09/24 for 28 day supply  Medication: Jardiance  25 mg  Last fill date: 05/24/24 for 30 day supply  Insurance report was not up to date. No action needed at this time.   Kenyia Wambolt E. Marsh, PharmD Clinical Pharmacist [facility] [phone]

## 2024-06-09 ENCOUNTER — Other Ambulatory Visit: Payer: Self-pay | Admitting: Family Medicine

## 2024-06-09 DIAGNOSIS — I2510 Atherosclerotic heart disease of native coronary artery without angina pectoris: Secondary | ICD-10-CM

## 2024-06-09 DIAGNOSIS — E1165 Type 2 diabetes mellitus with hyperglycemia: Secondary | ICD-10-CM

## 2024-06-24 ENCOUNTER — Other Ambulatory Visit: Payer: Self-pay | Admitting: Family Medicine

## 2024-06-24 DIAGNOSIS — I2510 Atherosclerotic heart disease of native coronary artery without angina pectoris: Secondary | ICD-10-CM

## 2024-06-24 DIAGNOSIS — E1169 Type 2 diabetes mellitus with other specified complication: Secondary | ICD-10-CM

## 2024-06-28 ENCOUNTER — Other Ambulatory Visit: Payer: Self-pay | Admitting: Family Medicine

## 2024-07-21 ENCOUNTER — Other Ambulatory Visit: Payer: Self-pay | Admitting: Family Medicine

## 2024-07-28 ENCOUNTER — Other Ambulatory Visit: Payer: Self-pay | Admitting: Family Medicine

## 2024-08-02 ENCOUNTER — Other Ambulatory Visit: Payer: Self-pay | Admitting: Family Medicine

## 2024-08-02 DIAGNOSIS — K219 Gastro-esophageal reflux disease without esophagitis: Secondary | ICD-10-CM

## 2024-08-19 ENCOUNTER — Other Ambulatory Visit: Payer: Self-pay | Admitting: Family Medicine

## 2024-08-25 ENCOUNTER — Other Ambulatory Visit: Payer: Self-pay | Admitting: Family Medicine

## 2024-09-01 ENCOUNTER — Other Ambulatory Visit: Payer: Self-pay | Admitting: Family Medicine

## 2024-09-22 ENCOUNTER — Ambulatory Visit: Admitting: Family Medicine

## 2024-09-22 ENCOUNTER — Encounter: Payer: Self-pay | Admitting: Family Medicine

## 2024-09-22 VITALS — BP 120/72 | HR 66 | Ht 72.0 in | Wt 198.5 lb

## 2024-09-22 DIAGNOSIS — N182 Chronic kidney disease, stage 2 (mild): Secondary | ICD-10-CM

## 2024-09-22 DIAGNOSIS — Z23 Encounter for immunization: Secondary | ICD-10-CM

## 2024-09-22 DIAGNOSIS — Z0000 Encounter for general adult medical examination without abnormal findings: Secondary | ICD-10-CM

## 2024-09-22 DIAGNOSIS — E1159 Type 2 diabetes mellitus with other circulatory complications: Secondary | ICD-10-CM

## 2024-09-22 DIAGNOSIS — E1142 Type 2 diabetes mellitus with diabetic polyneuropathy: Secondary | ICD-10-CM

## 2024-09-22 DIAGNOSIS — E1169 Type 2 diabetes mellitus with other specified complication: Secondary | ICD-10-CM

## 2024-09-22 DIAGNOSIS — S299XXA Unspecified injury of thorax, initial encounter: Secondary | ICD-10-CM

## 2024-09-22 MED ORDER — HYDROCODONE-ACETAMINOPHEN 5-325 MG PO TABS: 1.0000 | ORAL_TABLET | Freq: Two times a day (BID) | ORAL | 0 refills | Status: DC | PRN

## 2024-09-22 MED ORDER — PREGABALIN 50 MG PO CAPS: 50.0000 mg | ORAL_CAPSULE | Freq: Two times a day (BID) | ORAL | 2 refills | Status: AC
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
## 2024-09-22 NOTE — Assessment & Plan Note (Signed)
Continue statin Recheck FLP and CMP 

## 2024-09-22 NOTE — Assessment & Plan Note (Signed)
 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 Gabapentin  was previously discontinued due to gastrointestinal side effects. Lyrica is considered as an alternative, though it is also a capsule. Blood pressure is well-controlled at home but elevated in the office, likely due to anxiety. Hyperlipidemia is managed with current medications. - Prescribed Lyrica 50 mg twice daily for neuropathy. - Continue current medications: amlodipine , baby aspirin, Lipitor, Plavix , Cymbalta , Jardiance , Zetia , fenofibrate , metformin , metoprolol , pantoprazole , Ozempic , and Flomax . - Monitor blood pressure and adjust medications if necessary. - Scheduled follow-up in three months to assess neuropathy management.

## 2024-09-22 NOTE — Assessment & Plan Note (Signed)
 Well controlled Continue current medications Recheck metabolic panel

## 2024-09-22 NOTE — Assessment & Plan Note (Signed)
 Chronic and stable Recheck metabolic panel Avoid nephrotoxic meds

## 2024-09-23 LAB — COMPREHENSIVE METABOLIC PANEL WITH GFR
ALT: 20 IU/L (ref 0–44)
AST: 21 IU/L (ref 0–40)
Albumin: 4.8 g/dL (ref 3.8–4.8)
Alkaline Phosphatase: 44 IU/L — ABNORMAL LOW (ref 47–123)
BUN/Creatinine Ratio: 15 (ref 10–24)
BUN: 16 mg/dL (ref 8–27)
Bilirubin Total: 0.4 mg/dL (ref 0.0–1.2)
CO2: 23 mmol/L (ref 20–29)
Calcium: 10.1 mg/dL (ref 8.6–10.2)
Chloride: 99 mmol/L (ref 96–106)
Creatinine, Ser: 1.1 mg/dL (ref 0.76–1.27)
Globulin, Total: 2.4 g/dL (ref 1.5–4.5)
Glucose: 154 mg/dL — ABNORMAL HIGH (ref 70–99)
Potassium: 4.7 mmol/L (ref 3.5–5.2)
Sodium: 136 mmol/L (ref 134–144)
Total Protein: 7.2 g/dL (ref 6.0–8.5)
eGFR: 70 mL/min/1.73 (ref >59)

## 2024-09-23 LAB — HEMOGLOBIN A1C
Est. average glucose Bld gHb Est-mCnc: 200 mg/dL
Hgb A1c MFr Bld: 8.6 % — ABNORMAL HIGH (ref 4.8–5.6)

## 2024-09-23 LAB — LIPID PANEL
Chol/HDL Ratio: 4 ratio (ref 0.0–5.0)
Cholesterol, Total: 105 mg/dL (ref 100–199)
HDL: 26 mg/dL — ABNORMAL LOW (ref >39)
LDL Chol Calc (NIH): 60 mg/dL (ref 0–99)
Triglycerides: 97 mg/dL (ref 0–149)
VLDL Cholesterol Cal: 19 mg/dL (ref 5–40)

## 2024-09-25 ENCOUNTER — Other Ambulatory Visit: Payer: Self-pay | Admitting: Family Medicine

## 2024-09-25 DIAGNOSIS — E1165 Type 2 diabetes mellitus with hyperglycemia: Secondary | ICD-10-CM

## 2024-09-29 ENCOUNTER — Ambulatory Visit: Admitting: Family Medicine

## 2024-09-29 ENCOUNTER — Other Ambulatory Visit (HOSPITAL_COMMUNITY): Payer: Self-pay

## 2024-09-29 ENCOUNTER — Ambulatory Visit: Payer: Self-pay

## 2024-09-29 ENCOUNTER — Telehealth: Payer: Self-pay

## 2024-09-29 ENCOUNTER — Encounter: Payer: Self-pay | Admitting: Family Medicine

## 2024-09-29 VITALS — BP 119/68 | HR 98 | Temp 98.4°F | Resp 16 | Ht 72.0 in | Wt 198.0 lb

## 2024-09-29 DIAGNOSIS — J441 Chronic obstructive pulmonary disease with (acute) exacerbation: Secondary | ICD-10-CM

## 2024-09-29 MED ORDER — PREDNISONE 20 MG PO TABS: 40.0000 mg | ORAL_TABLET | Freq: Every day | ORAL | 0 refills | Status: AC

## 2024-09-29 MED ORDER — AZITHROMYCIN 250 MG PO TABS: ORAL_TABLET | ORAL | 0 refills | Status: AC

## 2024-09-29 MED ORDER — ALBUTEROL SULFATE HFA 108 (90 BASE) MCG/ACT IN AERS: 2.0000 | INHALATION_SPRAY | Freq: Four times a day (QID) | RESPIRATORY_TRACT | 0 refills | Status: DC | PRN

## 2024-09-29 NOTE — Progress Notes (Signed)
 Acute visit   Patient: [name]   DOB: 13-Mar-1951   73 y.o. Male  MRN: [id number] PCP: [name], MD   Chief Complaint  Patient presents with   Acute Visit    Cough( since Saturday)    Subjective    Discussed the use of AI scribe software for clinical note transcription with the patient, who gave verbal consent to proceed.  History of Present Illness   [name] is a 73 year old male who presents with a persistent cough and shortness of breath. He is accompanied by his granddaughter.  He has had a persistent cough since Saturday causing rib soreness and chest pain during intense coughing fits. The cough is mostly dry with occasional clear sputum and is worse during the day.  He also has shortness of breath, worse at night, with wheezing and nasal congestion. Breathing was severe enough last night that he considered going to the hospital. Over-the-counter cough syrup every four hours has not helped, and he has not used other medications for these symptoms.  He smoked in the past but quit about four years ago. He denies recent sick contacts.        Review of Systems  Objective    BP 119/68 (BP Location: Right Arm, Patient Position: Sitting)   Pulse 98   Temp 98.4 F (36.9 C) (Oral)   Resp 16   Ht 6' (1.829 m)   Wt 198 lb (89.8 kg)   SpO2 100%   BMI 26.85 kg/m   Physical Exam Vitals reviewed.  Constitutional:      General: He is not in acute distress.    Appearance: Normal appearance. He is not diaphoretic.  HENT:     Head: Normocephalic and atraumatic.  Eyes:     General: No scleral icterus.    Conjunctiva/sclera: Conjunctivae normal.  Cardiovascular:     Rate and Rhythm: Normal rate and regular rhythm.     Heart sounds: Normal heart sounds. No murmur heard. Pulmonary:     Effort: Pulmonary effort is normal. No respiratory distress.     Breath sounds: Wheezing and rhonchi present.  Musculoskeletal:     Cervical back: Neck supple.      Right lower leg: No edema.     Left lower leg: No edema.  Lymphadenopathy:     Cervical: No cervical adenopathy.  Skin:    General: Skin is warm and dry.     Findings: No rash.  Neurological:     Mental Status: He is alert and oriented to person, place, and time. Mental status is at baseline.  Psychiatric:        Mood and Affect: Mood normal.        Behavior: Behavior normal.       No results found for any visits on 09/29/24.  Assessment & Plan     Problem List Items Addressed This Visit   None Visit Diagnoses       COPD exacerbation (HCC)    -  Primary   Relevant Medications   predniSONE  (DELTASONE ) 20 MG tablet   azithromycin  (ZITHROMAX ) 250 MG tablet   albuterol  (VENTOLIN  HFA) 108 (90 Base) MCG/ACT inhaler          COPD exacerbation Acute exacerbation of COPD with wheezing, chest tightness, and productive cough. Symptoms worsen at night with wheezing and shortness of breath. No current smoking history, but previous smoking contributes to lung damage. No signs of COVID, flu, or  strep. Likely due to mucus trapping and potential bacterial infection. - Prescribed prednisone  40 mg daily for 7 days, to be taken in the morning with food to reduce lung inflammation. - Prescribed azithromycin  (Z-Pak) with a loading dose of two pills on the first day, followed by one pill daily for the next four days, to address potential bacterial infection. - Prescribed albuterol  inhaler for use during episodes of wheezing and shortness of breath. - Advised continuation of over-the-counter medications like Mucinex or cough medicine as needed.       Meds ordered this encounter  Medications   predniSONE  (DELTASONE ) 20 MG tablet    Sig: Take 2 tablets (40 mg total) by mouth daily with breakfast for 7 days.    Dispense:  14 tablet    Refill:  0   azithromycin  (ZITHROMAX ) 250 MG tablet    Sig: Take 500mg  PO daily x1d and then 250mg  daily x4 days    Dispense:  6 each    Refill:  0    albuterol  (VENTOLIN  HFA) 108 (90 Base) MCG/ACT inhaler    Sig: Inhale 2 puffs into the lungs every 6 (six) hours as needed for wheezing or shortness of breath.    Dispense:  8 g    Refill:  0     Return if symptoms worsen or fail to improve.      Jon Eva, MD  [facility] [phone] (phone) [phone] (fax)  [facility]

## 2024-09-29 NOTE — Telephone Encounter (Signed)
 Noted

## 2024-09-29 NOTE — Telephone Encounter (Signed)
 Spoke with pharmacy who reports that they filled rx this morning and will be contacting the patient

## 2024-09-29 NOTE — Telephone Encounter (Signed)
 FYI Only or Action Required?: FYI only for provider: appointment scheduled on 09/29/2024.  Patient was last seen in primary care on 09/22/2024 by [name], MD.  Called Nurse Triage reporting Cough.  Symptoms began several days ago.  Interventions attempted: OTC medications: cough medicine.  Symptoms are: gradually worsening.  Triage Disposition: See HCP Within 4 Hours (Or PCP Triage)  Patient/caregiver understands and will follow disposition?: Yes  Copied from CRM #[id number]. Topic: Clinical - Red Word Triage >> Sep 29, 2024 11:00 AM [name] wrote: Red Word that prompted transfer to Nurse Triage: pt had flu shot last week and he is now under the weather and feeling really bad. Chest hurts, coughing,his talking is course.  Saturday cough fatigue Reason for Disposition  Wheezing is present  Answer Assessment - Initial Assessment Questions Got flu shot last Monday. Hasn't felt good since. Stated to family he wanted for call an ambulance last night because he was having trouble breathing. Daughter came and checked on patient and gave him cold medicine. Granddaughter on phone not with patient but conference him into call. Encouraged patient to not lay flat as it was exasperating symptoms. 1. ONSET: When did the cough begin?      2 days ago 09/27/2024 3. SPUTUM: Describe the color of your sputum (e.g., none, dry cough; clear, white, yellow, green)     white 4. HEMOPTYSIS: Are you coughing up any blood? If Yes, ask: How much? (e.g., flecks, streaks, tablespoons, etc.)     denies 5. DIFFICULTY BREATHING: Are you having difficulty breathing? If Yes, ask: How bad is it? (e.g., mild, moderate, severe)      SOB when you lay down 6. FEVER: Do you have a fever? If Yes, ask: What is your temperature, how was it measured, and when did it start?     Denies, states he had chills 7. CARDIAC HISTORY: Do you have any history of heart disease? (e.g., heart attack, congestive  heart failure)      HTN CAD 8. LUNG HISTORY: Do you have any history of lung disease?  (e.g., pulmonary embolus, asthma, emphysema)     Denies 10. OTHER SYMPTOMS: Do you have any other symptoms? (e.g., runny nose, wheezing, chest pain)       Wheeze, fatigue  Protocols used: Cough - Acute Productive-A-AH

## 2024-09-29 NOTE — Telephone Encounter (Signed)
 Copied from CRM [id number]. Topic: Clinical - Prescription Issue >> Sep 26, 2024 10:04 AM Franky GRADE wrote: Reason for CRM: Patient's daughter Camelia is calling because patient's pharmacy informed them that they have not received the prescription for patient's HYDROcodone -acetaminophen  (NORCO/VICODIN) 5-325 MG tablet [[id number]]. I advised it was sent in on 09/22/2024 but she just got off the phone with them and they don't have it.

## 2024-10-01 ENCOUNTER — Ambulatory Visit: Payer: Self-pay

## 2024-10-01 NOTE — Telephone Encounter (Signed)
 FYI Only or Action Required?: FYI only for provider: Home care advised.  Patient was last seen in primary care on 09/29/2024 by Stephen Jon HERO, MD.  Called Nurse Triage reporting Cough.  Symptoms began several days ago.  Interventions attempted: OTC medications: Cough syrup(Coricidin) and Prescription medications: Azithromycin , Prednisone .  Symptoms are: unchanged.  Triage Disposition: Home Care  Patient/caregiver understands and will follow disposition?: Yes  Reason for Disposition  Cough  Answer Assessment - Initial Assessment Questions Spoke with patient's daughter, Camelia, who states that patient has been taking medications prescribed by PCP since Monday. She also states that he's been taking an OTC cough syrup that is not helping the cough. She mentions that he has been able to cough sputum up, but less today. She also mentions that he's very uncomfortable due to soreness in the ribs that is worsened by the cough. Home care advised, but wanting to know what PCP would recommend.   1. ONSET: When did the cough begin?      Saturday  2. SEVERITY: How bad is the cough today?     Mild-moderate  3. SPUTUM: Describe the color of your sputum (e.g., none, dry cough; clear, white, yellow, green)     Unknown  4. HEMOPTYSIS: Are you coughing up any blood? If Yes, ask: How much? (e.g., flecks, streaks, tablespoons, etc.)     No  5. DIFFICULTY BREATHING: Are you having difficulty breathing? If Yes, ask: How bad is it? (e.g., mild, moderate, severe)      No  6. FEVER: Do you have a fever? If Yes, ask: What is your temperature, how was it measured, and when did it start?     No  7. CARDIAC HISTORY: Do you have any history of heart disease? (e.g., heart attack, congestive heart failure)      HTN, CAD  8. LUNG HISTORY: Do you have any history of lung disease?  (e.g., pulmonary embolus, asthma, emphysema)     No  9. PE RISK FACTORS: Do you have a history of  blood clots? (or: recent major surgery, recent prolonged travel, bedridden)     No  10. OTHER SYMPTOMS: Do you have any other symptoms? (e.g., runny nose, wheezing, chest pain)       Denies chest pain, current wheezing  11. PREGNANCY: Is there any chance you are pregnant? When was your last menstrual period?       NA  12. TRAVEL: Have you traveled out of the country in the last month? (e.g., travel history, exposures)       No  Protocols used: Cough - Acute Productive-A-AH  Copied from CRM #[id number]. Topic: Clinical - Red Word Triage >> Oct 01, 2024  3:28 PM Berwyn MATSU wrote: Red Word that prompted transfer to Nurse Triage: cough is not getting better OTC not working no stop cough making ribs and chest sore and having discomfort.

## 2024-10-02 ENCOUNTER — Telehealth: Payer: Self-pay

## 2024-10-02 ENCOUNTER — Ambulatory Visit: Payer: Self-pay | Admitting: Physician Assistant

## 2024-10-02 ENCOUNTER — Other Ambulatory Visit: Payer: Self-pay

## 2024-10-02 DIAGNOSIS — R051 Acute cough: Secondary | ICD-10-CM

## 2024-10-02 MED ORDER — BENZONATATE 100 MG PO CAPS: 100.0000 mg | ORAL_CAPSULE | Freq: Two times a day (BID) | ORAL | 0 refills | Status: DC | PRN

## 2024-10-02 NOTE — Telephone Encounter (Signed)
 Called and spoke with daughter and informed her of the medications that the provider sent in to his pharmacy.  She verbalized understanding.

## 2024-10-02 NOTE — Telephone Encounter (Signed)
 Copied from CRM #[id number]. Topic: Clinical - Medication Question >> Oct 02, 2024  9:21 AM Charlet HERO wrote: Reason for CRM: Patient daughter is calling to get asst with father not having cough syrup she wants to know if it has been called in. Spoke to Nt on yesterday and per the notes in chart Dr is out and waiting to see if would like to wait until Dr returns 12/15 or send to another Dr in office. The paitents daughter would like the script filled asap.

## 2024-10-02 NOTE — Telephone Encounter (Signed)
 Called and spoke with patient's daughter per DPR gave her provider's recommendation and she verbalized understanding.

## 2024-10-02 NOTE — Telephone Encounter (Signed)
 Ordered by Dr. Franchot 10/02/24

## 2024-10-02 NOTE — Telephone Encounter (Signed)
 Please call patient and let him know that I recommend tessalon perles and Robitussin DM as needed for his cough. I sent 2 prescriptions to his pharmacy. He can get the Robitussin DM over the counter if it is too expensive at the pharmacy.

## 2024-10-06 ENCOUNTER — Other Ambulatory Visit (HOSPITAL_COMMUNITY): Payer: Self-pay

## 2024-10-06 ENCOUNTER — Telehealth: Payer: Self-pay | Admitting: Pharmacy Technician

## 2024-10-06 NOTE — Telephone Encounter (Signed)
 Pharmacy Patient Advocate Encounter   Received notification from Onbase that prior authorization for  is due for renewal.   Insurance verification completed.   The patient is insured through [facility].  Action: PA required; PA started via CoverMyMeds. KEY [id number] . Waiting for clinical questions to populate.   **Current PA is good until 10/22/2024**

## 2024-10-07 ENCOUNTER — Other Ambulatory Visit (HOSPITAL_COMMUNITY): Payer: Self-pay

## 2024-10-21 ENCOUNTER — Ambulatory Visit: Payer: Self-pay

## 2024-10-21 NOTE — Telephone Encounter (Signed)
 Attempted to return call but NA and VM full, unable to LM. Attempt #1   Copied from CRM #[id number]. Topic: Clinical - Medication Question >> Oct 21, 2024 11:55 AM Willma SAUNDERS wrote: Reason for CRM: Patient states that Dr. B prescribed the patient a z-pack a couple weeks ago for a cough and body aches. Patient states the symptoms are back and is wanting to know if she can prescribe him another one.  Patients daughter Camelia can be reached at [phone]

## 2024-10-21 NOTE — Telephone Encounter (Signed)
 FYI Only or Action Required?: Action required by provider: clinical question for provider and update on patient condition.  Patient was last seen in primary care on 09/29/2024 by [name], MD.  Called Nurse Triage reporting Cough.  Symptoms began several weeks ago.  Interventions attempted: OTC medications: robitussin, Prescription medications: zpak; pred taper, and Rest, hydration, or home remedies.  Symptoms are: unchanged.  Triage Disposition: See PCP When Office is Open (Within 3 Days)  Patient/caregiver understands and will follow disposition?: No, wishes to speak with PCP   Copied from CRM #[id number]. Topic: Clinical - Medication Question >> Oct 21, 2024 11:55 AM [name] wrote: Reason for CRM: Patient states that Dr. B prescribed the patient a z-pack a couple weeks ago for a cough and body aches. Patient states the symptoms are back and is wanting to know if she can prescribe him another one.  Patients daughter Camelia can be reached at [phone]   Reason for Disposition  Cough has been present for > 3 weeks  Answer Assessment - Initial Assessment Questions Returned call to pt's daughter to f/u on symptoms. She states pt was seen 12/08, pt has completed pred taper and Zpak. Pt is using albuterol  inhaler PRN. She states pt was starting to feel better but once prednisone  ended, symptoms started to return. She states cough never resolved. Pt is currently taking Robitussin without much relief. Pt does not want to come back into office but does not want symptoms to get worse. Requested rx to [facility].     1. ONSET: When did the cough begin?      Several weeks ago; pt seen 12/08  2. SEVERITY: How bad is the cough today?      Mild to moderate; pt's daughter reports cough never went away with Zpak or prednisone    3. SPUTUM: Describe the color of your sputum (e.g., none, dry cough; clear, white, yellow, green)     Dry cough   4.  HEMOPTYSIS: Are you coughing up any blood? If Yes, ask: How much? (e.g., flecks, streaks, tablespoons, etc.)     No   5. DIFFICULTY BREATHING: Are you having difficulty breathing? If Yes, ask: How bad is it? (e.g., mild, moderate, severe)      None   6. FEVER: Do you have a fever? If Yes, ask: What is your temperature, how was it measured, and when did it start?     No   8. LUNG HISTORY: Do you have any history of lung disease?  (e.g., pulmonary embolus, asthma, emphysema)     COPD   10. OTHER SYMPTOMS: Do you have any other symptoms? (e.g., runny nose, wheezing, chest pain)       Body aches  Protocols used: Cough - Acute Non-Productive-A-AH

## 2024-10-21 NOTE — Telephone Encounter (Signed)
"  Recommend evaluation  "

## 2024-10-22 NOTE — Telephone Encounter (Signed)
 Per DPR, attempted to call patient's daughter, [name]. No answer and unable to LVM.  E2C2- When daughter returns call, please advise her that Dr. Myrla recommends an evaluation for patient. Please offer soonest available appt at our office, or if another office has availability sooner please offer that. If not agreeable please recommend patient to be seen at urgent care clinic.

## 2024-10-24 ENCOUNTER — Ambulatory Visit: Admitting: Internal Medicine

## 2024-10-24 ENCOUNTER — Other Ambulatory Visit: Payer: Self-pay

## 2024-10-24 ENCOUNTER — Encounter: Payer: Self-pay | Admitting: Internal Medicine

## 2024-10-24 VITALS — BP 138/86 | HR 87 | Temp 97.9°F | Resp 18 | Ht 72.0 in | Wt 197.9 lb

## 2024-10-24 DIAGNOSIS — R051 Acute cough: Secondary | ICD-10-CM | POA: Diagnosis not present

## 2024-10-24 DIAGNOSIS — J0100 Acute maxillary sinusitis, unspecified: Secondary | ICD-10-CM | POA: Diagnosis not present

## 2024-10-24 DIAGNOSIS — R062 Wheezing: Secondary | ICD-10-CM | POA: Diagnosis not present

## 2024-10-24 MED ORDER — FLUTICASONE PROPIONATE 50 MCG/ACT NA SUSP: 2.0000 | Freq: Every day | NASAL | 6 refills | Status: AC

## 2024-10-24 MED ORDER — DOXYCYCLINE HYCLATE 100 MG PO TABS: 100.0000 mg | ORAL_TABLET | Freq: Two times a day (BID) | ORAL | 0 refills | Status: AC

## 2024-10-24 MED ORDER — ALBUTEROL SULFATE HFA 108 (90 BASE) MCG/ACT IN AERS: 2.0000 | INHALATION_SPRAY | Freq: Four times a day (QID) | RESPIRATORY_TRACT | 0 refills | Status: AC | PRN

## 2024-10-24 MED ORDER — BENZONATATE 100 MG PO CAPS: 100.0000 mg | ORAL_CAPSULE | Freq: Two times a day (BID) | ORAL | 0 refills | Status: AC | PRN

## 2024-10-24 NOTE — Progress Notes (Signed)
 "  Acute Office Visit  Subjective:     Patient ID: [name], male    DOB: 15-Sep-1951, 74 y.o.   MRN: [id number]  Chief Complaint  Patient presents with   Cough    For 3 weeks   Ear Pain    Cough Associated symptoms include ear pain and wheezing. Pertinent negatives include no chills, fever, sore throat or shortness of breath.   Patient is in today for cough and ear pain x 3 weeks. This is my first time meeting him.   Discussed the use of AI scribe software for clinical note transcription with the patient, who gave verbal consent to proceed.  History of Present Illness THEOPOLIS SLOOP is a 74 year old male who presents with persistent respiratory symptoms following a recent illness.  He has had 3 weeks of cough with yellow sputum and nocturnal wheezing, worse when lying down. He denies hemoptysis and daytime shortness of breath. He received a flu shot before symptom onset and was treated with a Z-Pak, steroids, and cough medicine with only partial relief. He wakes around midnight and 3 AM with a choking sensation and trouble falling back asleep. He uses Tessalon  Perles, which help his cough, and an albuterol  inhaler for wheezing. He has not tried nasal sprays. He reports a childhood penicillin allergy but is unsure if it is true.    Review of Systems  Constitutional:  Negative for chills and fever.  HENT:  Positive for congestion, ear pain and sinus pain. Negative for sore throat.   Respiratory:  Positive for cough and wheezing. Negative for shortness of breath.         Objective:    BP 138/86 (Cuff Size: Large)   Pulse 87   Temp 97.9 F (36.6 C) (Oral)   Resp 18   Ht 6' (1.829 m)   Wt 197 lb 14.4 oz (89.8 kg)   SpO2 97%   BMI 26.84 kg/m    Physical Exam Constitutional:      Appearance: Normal appearance.  HENT:     Head: Normocephalic and atraumatic.     Right Ear: Tympanic membrane, ear canal and external ear normal.     Left Ear: Tympanic membrane, ear  canal and external ear normal.     Nose: Congestion present.     Mouth/Throat:     Mouth: Mucous membranes are moist.     Comments: Mild PND present Eyes:     Conjunctiva/sclera: Conjunctivae normal.  Cardiovascular:     Rate and Rhythm: Normal rate and regular rhythm.  Pulmonary:     Effort: Pulmonary effort is normal.     Breath sounds: Normal breath sounds. No wheezing, rhonchi or rales.  Skin:    General: Skin is warm and dry.  Neurological:     General: No focal deficit present.     Mental Status: He is alert. Mental status is at baseline.  Psychiatric:        Mood and Affect: Mood normal.        Behavior: Behavior normal.     No results found for any visits on 10/24/24.      Assessment & Plan:   Assessment & Plan Acute sinusitis with cough and wheezing Inflammatory sinusitis likely causing symptoms. Previous azithromycin  and steroids provided partial relief. No chronic respiratory conditions. - Prescribed doxycycline  with food to prevent nausea; discussed photosensitivity. - Refilled albuterol  inhaler for use every 4-6 hours as needed for dyspnea or wheezing. - Refilled Tessalon  Perles  for cough management. - Recommended over-the-counter nasal saline spray for sinus pressure relief. - Suggested Flonase nasal spray if nasal saline is ineffective.  - benzonatate  (TESSALON ) 100 MG capsule; Take 1 capsule (100 mg total) by mouth 2 (two) times daily as needed for cough.  Dispense: 20 capsule; Refill: 0 - doxycycline  (VIBRA -TABS) 100 MG tablet; Take 1 tablet (100 mg total) by mouth 2 (two) times daily for 7 days.  Dispense: 14 tablet; Refill: 0 - benzonatate  (TESSALON ) 100 MG capsule; Take 1 capsule (100 mg total) by mouth 2 (two) times daily as needed for cough.  Dispense: 20 capsule; Refill: 0 - fluticasone (FLONASE) 50 MCG/ACT nasal spray; Place 2 sprays into both nostrils daily.  Dispense: 16 g; Refill: 6 - albuterol  (VENTOLIN  HFA) 108 (90 Base) MCG/ACT inhaler; Inhale 2  puffs into the lungs every 6 (six) hours as needed for wheezing or shortness of breath.  Dispense: 8 g; Refill: 0   Return if symptoms worsen or fail to improve.  Sharyle Fischer, DO   "

## 2024-10-29 ENCOUNTER — Other Ambulatory Visit: Payer: Self-pay | Admitting: Family Medicine

## 2024-10-29 DIAGNOSIS — K219 Gastro-esophageal reflux disease without esophagitis: Secondary | ICD-10-CM

## 2024-11-12 ENCOUNTER — Ambulatory Visit: Payer: Self-pay

## 2024-11-12 ENCOUNTER — Telehealth

## 2024-11-12 NOTE — Telephone Encounter (Signed)
 FYI Only or Action Required?: FYI only for provider: appointment scheduled on 11/12/24.  Patient was last seen in primary care on 10/24/2024 by Bernardo Fend, DO.  Called Nurse Triage reporting Covid Positive.  Symptoms began several days ago.  Interventions attempted: OTC medications: Robitussin.  Symptoms are: unchanged.  Triage Disposition: Call PCP Within 24 Hours  Patient/caregiver understands and will follow disposition?: Yes  Message from Rouse G sent at 11/12/2024  4:53 PM EST  Reason for Triage: Pt daughter checked pt and he has COVID. He has been coughing, not sleeping well, stopped up, blood pressure elevated 154/87, headache, face burning, and dizzy.    Reason for Disposition  [1] HIGH RISK patient (e.g., weak immune system, 65 years and older, obesity with BMI 30 or higher, pregnant, chronic lung disease) AND [2] COVID symptoms (e.g., cough, fever)  (Exceptions: Already seen by PCP and no new or worsening symptoms.)  Answer Assessment - Initial Assessment Questions 1. SYMPTOMS: What is your main symptom or concern? (e.g., cough, fever, shortness of breath, muscle aches)     cough 2. ONSET: When did the symptoms start?      Monday 3. COUGH: Do you have a cough? If Yes, ask: How bad is the cough?       yes 4. FEVER: Do you have a fever? If Yes, ask: What is your temperature, how was it measured, and when did it start?     no 5. BREATHING DIFFICULTY: Are you having any difficulty breathing? (e.g., normal; shortness of breath, wheezing, unable to speak)      no 6. BETTER-SAME-WORSE: Are you getting better, staying the same or getting worse compared to yesterday?  If getting worse, ask, In what way?     Same or worse 7. OTHER SYMPTOMS: Do you have any other symptoms?  (e.g., chills, fatigue, headache, loss of smell or taste, muscle pain, sore throat)     HA, blood pressure elevated 8. COVID-19 DIAGNOSIS: How do you know that you have COVID?  (e.g., positive lab test or self-test, diagnosed by doctor or NP/PA, symptoms after exposure).     Home test + 9. COVID-19 EXPOSURE: Was there any known exposure to COVID before the symptoms began?      Yes to uncle   90. HIGH RISK DISEASE: Do you have any chronic medical problems? (e.g., asthma, heart or lung disease, weak immune system, obesity, etc.)       yes  Protocols used: COVID-19 - Diagnosed or Suspected-A-AH

## 2024-11-12 NOTE — Progress Notes (Deleted)
 I texted link to specified number 3 times. I called the number and it went to voicemail. I was unable to connect with this patient by virtual urgent care and marked his appt as no show.

## 2024-11-13 NOTE — Telephone Encounter (Signed)
 Need to know duration of symptoms (or first day or symptoms) to know if he is a candidate for paxlovid.  Happy to do virtual visit to discuss (can double book my 1pm with a virtual visit today)

## 2024-11-14 ENCOUNTER — Encounter: Payer: Self-pay | Admitting: Physician Assistant

## 2024-11-14 ENCOUNTER — Ambulatory Visit: Admitting: Physician Assistant

## 2024-11-14 VITALS — BP 128/74 | HR 79 | Temp 97.8°F | Resp 14 | Wt 197.4 lb

## 2024-11-14 DIAGNOSIS — R051 Acute cough: Secondary | ICD-10-CM | POA: Diagnosis not present

## 2024-11-14 DIAGNOSIS — U071 COVID-19: Secondary | ICD-10-CM

## 2024-11-14 MED ORDER — NIRMATRELVIR/RITONAVIR (PAXLOVID)TABLET: 3.0000 | ORAL_TABLET | Freq: Two times a day (BID) | ORAL | 0 refills | Status: AC

## 2024-11-14 NOTE — Telephone Encounter (Signed)
 Copied from CRM #[id number]. Topic: Clinical - Prescription Issue >> Nov 14, 2024  2:55 PM Emylou G wrote: Reason for CRM: Brother Alm called.. was in the office today.. went to get the covid script he rcvd at [facility].. they don't have the medication.. Can we send it to [facility] on [address] asap.SABRA

## 2024-11-15 NOTE — Progress Notes (Unsigned)
 " Established patient visit  Patient: [name]   DOB: 01-Jun-1951   74 y.o. Male  MRN: [id number] Visit Date: 11/14/2024  Today's healthcare provider: Jolynn Spencer, PA-C   Chief Complaint  Patient presents with   Covid Positive    Symptoms: Cold chills, cough, runny nose, sore throat, began Wednesday and  tested Positive for covid. OTC Meds: Robitussin   Subjective     HPI     Covid Positive    Additional comments: Symptoms: Cold chills, cough, runny nose, sore throat, began Wednesday and  tested Positive for covid. OTC Meds: Robitussin      Last edited by [name], CMA on 11/14/2024  2:05 PM.       Discussed the use of AI scribe software for clinical note transcription with the patient, who gave verbal consent to proceed.  History of Present Illness [name] is a 74 year old male who presents with symptoms following a positive COVID-19 test.  He tested positive for COVID-19 on Wednesday and that evening developed rhinorrhea and sore throat, followed by chills on Thursday. He is unsure if he has had a fever. He is using Flonase  for nasal congestion and over-the-counter daytime and nighttime cough syrup. He is drinking 3 to 5 bottles of water daily to stay hydrated.       09/29/2024    2:13 PM 09/22/2024    9:21 AM 02/21/2024    9:41 AM  Depression screen PHQ 2/9  Decreased Interest 0 0 0  Down, Depressed, Hopeless 0 0 0  PHQ - 2 Score 0 0 0  Altered sleeping 0 3 3  Tired, decreased energy 0 0 0  Change in appetite 0 1 1  Feeling bad or failure about yourself  0 0 0  Trouble concentrating 0 0 0  Moving slowly or fidgety/restless 0 2 0  Suicidal thoughts 0 0 0  PHQ-9 Score 0 6 4   Difficult doing work/chores Not difficult at all Somewhat difficult Not difficult at all     Data saved with a previous flowsheet row definition      09/29/2024    2:13 PM 09/22/2024    9:21 AM 02/21/2024    9:41 AM 10/08/2023   10:04 AM  GAD 7 : Generalized Anxiety  Score  Nervous, Anxious, on Edge 0  1  0  0   Control/stop worrying 0  2  0  0   Worry too much - different things 0  2  0  0   Trouble relaxing 0  2  0  0   Restless 0  1  0  3   Easily annoyed or irritable 0  0  0  2   Afraid - awful might happen 0  0  0  0   Total GAD 7 Score 0 8 0 5  Anxiety Difficulty Not difficult at all Somewhat difficult Not difficult at all Not difficult at all     Data saved with a previous flowsheet row definition    Medications: Show/hide medication list[1]  Review of Systems All negative Except see HPI   {Insert previous labs (optional):23779} {See past labs  Heme  Chem  Endocrine  Serology  Results Review (optional):1}   Objective    BP 128/74 (BP Location: Right Arm, Patient Position: Sitting, Cuff Size: Normal)   Pulse 79   Temp 97.8 F (36.6 C)   Resp 14   Wt 197 lb 6.4 oz (89.5 kg)  SpO2 98%   BMI 26.77 kg/m  {Insert last BP/Wt (optional):23777}{See vitals history (optional):1}   Physical Exam   No results found for any visits on 11/14/24.      Assessment and Plan Assessment & Plan COVID-19 with acute upper respiratory symptoms Confirmed COVID-19 with mild to moderate symptoms including rhinorrhea, pharyngitis, and chills. No fever. Lungs clear. - Prescribed Paxlovid , initiated within five days of symptom onset. - Advised increased fluid intake, nasal saline rinse, warm salt gargle. - Recommended Flonase  for congestion. - Continue current cough syrup as needed. - Advised mask use to prevent transmission. - Sent prescription to pharmacy.    No orders of the defined types were placed in this encounter.   No follow-ups on file.   The patient was advised to call back or seek an in-person evaluation if the symptoms worsen or if the condition fails to improve as anticipated.  I discussed the assessment and treatment plan with the patient. The patient was provided an opportunity to ask questions and all were answered. The  patient agreed with the plan and demonstrated an understanding of the instructions.  I, Stephen Righter, PA-C have reviewed all documentation for this visit. The documentation on 11/14/2024  for the exam, diagnosis, procedures, and orders are all accurate and complete.  [name], [facility], MMS [name] [phone] (phone) [phone] (fax)  [facility]    [1]  Outpatient Medications Prior to Visit  Medication Sig   acetaminophen  (TYLENOL ) 500 MG tablet Take 1,000 mg by mouth every 6 (six) hours as needed for mild pain or moderate pain.   albuterol  (VENTOLIN  HFA) 108 (90 Base) MCG/ACT inhaler Inhale 2 puffs into the lungs every 6 (six) hours as needed for wheezing or shortness of breath.   amLODipine  (NORVASC ) 10 MG tablet TAKE 1 TABLET BY MOUTH DAILY   aspirin 81 MG tablet Take 81 mg by mouth daily.    atorvastatin  (LIPITOR) 80 MG tablet TAKE 1 TABLET BY MOUTH DAILY   azithromycin  (ZITHROMAX ) 250 MG tablet Take 500mg  PO daily x1d and then 250mg  daily x4 days   benzonatate  (TESSALON ) 100 MG capsule Take 1 capsule (100 mg total) by mouth 2 (two) times daily as needed for cough.   blood glucose meter kit and supplies KIT Dispense based on patient and insurance preference. Check sugars once daily   Cholecalciferol (VITAMIN D3 PO) Take 1 tablet by mouth daily.   clopidogrel  (PLAVIX ) 75 MG tablet TAKE 1 TABLET BY MOUTH DAILY   DULoxetine  (CYMBALTA ) 20 MG capsule TAKE 1 CAPSULE BY MOUTH DAILY   empagliflozin  (JARDIANCE ) 25 MG TABS tablet TAKE 1 TABLET BY MOUTH DAILY   ezetimibe  (ZETIA ) 10 MG tablet TAKE 1 TABLET BY MOUTH DAILY   fenofibrate  (TRICOR ) 145 MG tablet TAKE 1 TABLET BY MOUTH DAILY   fluticasone  (FLONASE ) 50 MCG/ACT nasal spray Place 2 sprays into both nostrils daily.   glucose blood (ONETOUCH VERIO) test strip TEST BLOOD GLUCOSE EVERY DAY   guaiFENesin-dextromethorphan (ROBITUSSIN DM) 100-10 MG/5ML syrup Take 5 mLs by mouth every 4 (four) hours as  needed for cough.   HYDROcodone -acetaminophen  (NORCO/VICODIN) 5-325 MG tablet Take 1 tablet by mouth 2 (two) times daily between meals as needed for moderate pain (pain score 4-6).   Lidocaine  4 % PTCH Apply 1 patch topically daily as needed (pain).   Liniments (BEN GAY EX) Apply 1 application topically daily as needed (pain).   lisinopril  (ZESTRIL ) 40 MG tablet TAKE 1 TABLET BY MOUTH DAILY  metFORMIN  (GLUCOPHAGE ) 1000 MG tablet TAKE 1 TABLET BY MOUTH 2 TIMES DAILY WITH A MEAL   metoprolol  tartrate (LOPRESSOR ) 50 MG tablet TAKE 1 TABLET BY MOUTH TWICE A DAY   OZEMPIC , 2 MG/DOSE, 8 MG/3ML SOPN INJECT 2 MG INTO THE SKIN ONCE A WEEK   pantoprazole  (PROTONIX ) 40 MG tablet TAKE 1 TABLET BY MOUTH DAILY   pregabalin  (LYRICA ) 50 MG capsule Take 1 capsule (50 mg total) by mouth 2 (two) times daily.   tamsulosin  (FLOMAX ) 0.4 MG CAPS capsule TAKE 1 CAPSULE BY MOUTH DAILY   No facility-administered medications prior to visit.   "

## 2024-11-24 ENCOUNTER — Other Ambulatory Visit: Payer: Self-pay | Admitting: Family Medicine

## 2024-11-24 DIAGNOSIS — E1165 Type 2 diabetes mellitus with hyperglycemia: Secondary | ICD-10-CM

## 2024-11-24 DIAGNOSIS — I2510 Atherosclerotic heart disease of native coronary artery without angina pectoris: Secondary | ICD-10-CM

## 2024-12-22 ENCOUNTER — Ambulatory Visit: Admitting: Family Medicine

## 2025-02-11 ENCOUNTER — Ambulatory Visit: Payer: Self-pay
